# Patient Record
Sex: Male | Born: 1935 | Race: White | Hispanic: No | Marital: Married | Smoking: Former smoker
Health system: Southern US, Community
[De-identification: ages and names within clinical notes are randomized; demographics above are authoritative.]

## PROBLEM LIST (undated history)

## (undated) DIAGNOSIS — N200 Calculus of kidney: Secondary | ICD-10-CM

## (undated) DIAGNOSIS — E119 Type 2 diabetes mellitus without complications: Secondary | ICD-10-CM

## (undated) DIAGNOSIS — I639 Cerebral infarction, unspecified: Secondary | ICD-10-CM

## (undated) DIAGNOSIS — I10 Essential (primary) hypertension: Secondary | ICD-10-CM

## (undated) DIAGNOSIS — M1990 Unspecified osteoarthritis, unspecified site: Secondary | ICD-10-CM

## (undated) DIAGNOSIS — C61 Malignant neoplasm of prostate: Secondary | ICD-10-CM

## (undated) DIAGNOSIS — G4730 Sleep apnea, unspecified: Secondary | ICD-10-CM

## (undated) DIAGNOSIS — E7800 Pure hypercholesterolemia, unspecified: Secondary | ICD-10-CM

## (undated) HISTORY — PX: ADENOIDECTOMY: SUR15

## (undated) HISTORY — DX: Malignant neoplasm of prostate: C61

## (undated) HISTORY — DX: Cerebral infarction, unspecified: I63.9

## (undated) HISTORY — DX: Unspecified osteoarthritis, unspecified site: M19.90

## (undated) HISTORY — PX: OTHER SURGICAL HISTORY: SHX169

## (undated) HISTORY — PX: LIPOMA EXCISION: SHX5283

## (undated) HISTORY — PX: TONSILLECTOMY: SUR1361

## (undated) HISTORY — PX: HERNIA REPAIR: SHX51

---

## 2002-11-06 ENCOUNTER — Encounter (INDEPENDENT_AMBULATORY_CARE_PROVIDER_SITE_OTHER): Payer: Self-pay | Admitting: Specialist

## 2002-11-06 ENCOUNTER — Ambulatory Visit (HOSPITAL_COMMUNITY): Admission: RE | Admit: 2002-11-06 | Discharge: 2002-11-06 | Payer: Self-pay | Admitting: *Deleted

## 2002-11-06 ENCOUNTER — Encounter: Payer: Self-pay | Admitting: Gastroenterology

## 2003-10-03 ENCOUNTER — Ambulatory Visit (HOSPITAL_BASED_OUTPATIENT_CLINIC_OR_DEPARTMENT_OTHER): Admission: RE | Admit: 2003-10-03 | Discharge: 2003-10-03 | Payer: Self-pay | Admitting: Surgery

## 2003-10-03 ENCOUNTER — Ambulatory Visit (HOSPITAL_COMMUNITY): Admission: RE | Admit: 2003-10-03 | Discharge: 2003-10-03 | Payer: Self-pay | Admitting: Surgery

## 2003-10-03 ENCOUNTER — Encounter (INDEPENDENT_AMBULATORY_CARE_PROVIDER_SITE_OTHER): Payer: Self-pay | Admitting: *Deleted

## 2004-10-28 ENCOUNTER — Inpatient Hospital Stay (HOSPITAL_COMMUNITY): Admission: EM | Admit: 2004-10-28 | Discharge: 2004-10-29 | Payer: Self-pay | Admitting: Emergency Medicine

## 2009-01-28 ENCOUNTER — Encounter: Payer: Self-pay | Admitting: Gastroenterology

## 2009-06-02 ENCOUNTER — Encounter: Payer: Self-pay | Admitting: Gastroenterology

## 2009-06-04 ENCOUNTER — Encounter: Payer: Self-pay | Admitting: Gastroenterology

## 2009-09-02 ENCOUNTER — Ambulatory Visit: Payer: Self-pay | Admitting: Gastroenterology

## 2009-09-16 ENCOUNTER — Encounter: Payer: Self-pay | Admitting: Gastroenterology

## 2009-09-16 ENCOUNTER — Ambulatory Visit: Payer: Self-pay | Admitting: Gastroenterology

## 2009-09-18 ENCOUNTER — Encounter: Payer: Self-pay | Admitting: Gastroenterology

## 2010-07-14 ENCOUNTER — Ambulatory Visit: Payer: Self-pay | Admitting: Diagnostic Radiology

## 2010-07-14 ENCOUNTER — Emergency Department (HOSPITAL_BASED_OUTPATIENT_CLINIC_OR_DEPARTMENT_OTHER): Admission: EM | Admit: 2010-07-14 | Discharge: 2010-07-14 | Payer: Self-pay | Admitting: Emergency Medicine

## 2011-03-03 LAB — GLUCOSE, CAPILLARY
Glucose-Capillary: 120 mg/dL — ABNORMAL HIGH (ref 70–99)
Glucose-Capillary: 134 mg/dL — ABNORMAL HIGH (ref 70–99)

## 2011-03-28 ENCOUNTER — Ambulatory Visit: Admission: RE | Admit: 2011-03-28 | Payer: Medicare Other | Source: Ambulatory Visit | Admitting: Radiation Oncology

## 2011-03-28 DIAGNOSIS — Z7982 Long term (current) use of aspirin: Secondary | ICD-10-CM | POA: Insufficient documentation

## 2011-03-28 DIAGNOSIS — C61 Malignant neoplasm of prostate: Secondary | ICD-10-CM | POA: Insufficient documentation

## 2011-03-28 DIAGNOSIS — I10 Essential (primary) hypertension: Secondary | ICD-10-CM | POA: Insufficient documentation

## 2011-03-28 DIAGNOSIS — Z510 Encounter for antineoplastic radiation therapy: Secondary | ICD-10-CM | POA: Insufficient documentation

## 2011-03-28 DIAGNOSIS — Z79899 Other long term (current) drug therapy: Secondary | ICD-10-CM | POA: Insufficient documentation

## 2011-03-28 DIAGNOSIS — E119 Type 2 diabetes mellitus without complications: Secondary | ICD-10-CM | POA: Insufficient documentation

## 2011-03-28 DIAGNOSIS — M1990 Unspecified osteoarthritis, unspecified site: Secondary | ICD-10-CM | POA: Insufficient documentation

## 2011-03-28 DIAGNOSIS — E7800 Pure hypercholesterolemia, unspecified: Secondary | ICD-10-CM | POA: Insufficient documentation

## 2011-04-15 NOTE — Op Note (Signed)
NAME:  William [name], William [name]                          ACCOUNT NO.:  [id number]   MEDICAL RECORD NO.:  [id number]                   PATIENT TYPE:  AMB   LOCATION:  DSC                                  FACILITY:  [facility]   PHYSICIAN:  Abigail Miyamoto, M.D.              DATE OF BIRTH:  [birth year]   DATE OF PROCEDURE:  10/03/2003  DATE OF DISCHARGE:                                 OPERATIVE REPORT   PREOPERATIVE DIAGNOSIS:  3 cm mass left forehead and 5 cm posterior neck  mass.   POSTOPERATIVE DIAGNOSIS:  3 cm mass left forehead and 5 cm posterior neck  mass.   PROCEDURE:  Excision of 3 cm left forehead mass and excision of 5 cm left  posterior neck mass.   SURGEON:  Douglas A. Magnus Ivan, M.D.   ANESTHESIA:  1% lidocaine with monitored anesthesia care.   ESTIMATED BLOOD LOSS:  Minimal.   PROCEDURE IN DETAIL:  The patient was brought to the operating room and  identified as William [name].  He was placed supine on the operating table and  anesthesia was induced.  His forehead was then prepped and draped in the  usual sterile fashion.  The skin overlying the large mass was then  anesthetized with 1% lidocaine.  A small transverse incision was made across  the mass in the previous skin line.  Dissection was carried down to the mass  which was consistent with a lipoma and was excised completely with  electrocautery.  Hemostasis was then achieved in the wound with  electrocautery.  The wound was then further anesthetized with 1% lidocaine.  The subcutaneous tissue was then closed with interrupted 3-0 Vicryl suture,  the skin was closed with running 4-0 Monocryl.   The patient was then turned to the left lateral decubitus position.  His  posterior neck was then prepped and draped in the usual sterile fashion.  Again, the skin overlying the large mass was anesthetized with 1% lidocaine  with epinephrine.  A small transverse incision was made across the mass with  a #15 blade.  Dissection was  carried down to the mass which was again  consistent with a lipoma and was excised completely with electrocautery.  It  was then sent to pathology for identification along with the forehead mass.  The wound was then again irrigated with saline and hemostasis was achieved  with electrocautery.  The subcutaneous tissue was then closed with  interrupted 3-0 Vicryl suture, the skin was closed with running 4-0  Monocryl.  Steri-Strips, gauze, and tape was applied.  The patient tolerated  the procedure well.  All sponge, needle and instrument counts were correct  at the end of the procedure.  The patient was then extubated in the  operating room and taken in stable condition to the recovery room.  Abigail Miyamoto, M.D.    DB/MEDQ  D:  10/03/2003  T:  10/03/2003  Job:  [id number]

## 2011-04-15 NOTE — Op Note (Signed)
   NAME:  William [name], William [name] NO.:  [id number]   MEDICAL RECORD NO.:  [id number]                   PATIENT TYPE:  AMB   LOCATION:  ED                                   FACILITY:  [facility]   PHYSICIAN:  Georgiana Spinner, M.D.                 DATE OF BIRTH:  08-Apr-1934   DATE OF PROCEDURE:  11/06/2002  DATE OF DISCHARGE:                                 OPERATIVE REPORT   PROCEDURE:  Upper endoscopy.   INDICATIONS:  GERD.   ANESTHESIA:  Demerol 60 mg, Versed 5 mg.   DESCRIPTION OF PROCEDURE:  With the patient mildly sedated in the left  lateral decubitus position, the Olympus videoscopic endoscope inserted in  the mouth, passed under direct vision through the esophagus.  Small hiatal  hernia was seen, with no evidence of Barrett's.  Photograph taken.  The  remainder of the stomach, fundus, body, antrum were visualized.  There were  flecks of blood seen in the stomach and the antrum, and some of the body  appeared somewhat erythematous, consistent with a gastritis, which was  photographed and biopsied.  We then entered into the duodenal bulb.  The  second portion of the duodenum appeared normal.  From this point the  endoscope was slowly withdrawn, taking circumferential views of the entire  duodenal mucosa until the endoscope pulled back into the stomach, placed in  retroflexion to view the stomach from below, and once again a hiatal hernia  was seen as evidenced by incomplete wrap of the GE junction around the  endoscope.  The endoscope was then straightened and withdrawn, taking  circumferential views of the remaining gastric and esophageal mucosa.  The  patient's vital signs and pulse oximetry remained stable.  The patient  tolerated the procedure well and without apparent complications.   FINDINGS:  Changes of gastritis and incomplete wrap of the gastroesophageal  junction around the endoscope, resulting in a hiatal hernia, otherwise an  unremarkable  exam.   PLAN:  Proceed to colonoscopy.  Await biopsy report.  The patient will call  me for results and follow up with me as an outpatient.                                               Georgiana Spinner, M.D.    GMO/MEDQ  D:  11/06/2002  T:  11/06/2002  Job:  [id number]

## 2011-04-15 NOTE — Op Note (Signed)
   NAME:  William [name], William [name] NO.:  [id number]   MEDICAL RECORD NO.:  [id number]                   PATIENT TYPE:  AMB   LOCATION:  ED                                   FACILITY[facility]  [facility]   PHYSICIAN:  Georgiana Spinner, M.D.                 DATE OF BIRTH:  [birth year]   DATE OF PROCEDURE:  11/06/2002  DATE OF DISCHARGE:                                 OPERATIVE REPORT   PROCEDURE:  Colonoscopy.   INDICATIONS:  Colon polyp.   ANESTHESIA:  Demerol 40 mg, Versed 2 mg.   DESCRIPTION OF PROCEDURE:  With the patient mildly sedated in the left  lateral decubitus position, the Olympus videoscopic colonoscope was inserted  in the rectum after normal rectal exam was performed, passed under direct  vision to the cecum, identified by the ileocecal valve and appendiceal  orifice, both of which were photographed.  We entered into the terminal  ileum through a widely patent ileocecal valve.  This too appeared normal and  was photographed.  From this point the colonoscope was slowly withdrawn,  taking circumferential views of the entire colonic mucosa, stopping on our  way to the rectum only at 15 cm from the anal verge, at which point a small  polyp was seen on a stalk.  It was photographed, and it was removed using  snare cautery technique, setting of 20-20 blended current.  Tissue was  retrieved for pathology.  In the rectum the endoscope was placed in  retroflexion to view the anal canal from above, which showed hemorrhoids.  The endoscope was straightened and withdrawn.  The patient's vital signs and  pulse oximetry remained stable.  The patient tolerated the procedure well  without apparent complications.   FINDINGS:  1. Rare diverticula of the sigmoid colon.  2. Small internal hemorrhoids.  3. Small polyp at 15 cm from the anal verge.   PLAN:  Await biopsy report.  The patient will call me for results and follow  up with me as an outpatient.  See endoscopy note for  further details.                                                Georgiana Spinner, M.D.    GMO/MEDQ  D:  11/06/2002  T:  11/06/2002  Job:  [id number]

## 2011-04-15 NOTE — Discharge Summary (Signed)
NAME:  William [name], William [name]                ACCOUNT NO.:  [id number]   MEDICAL RECORD NO.:  [id number]          PATIENT TYPE:  INP   LOCATION:  6524                         FACILITY:  [facility]   PHYSICIAN:  Danae Chen, M.D.DATE OF BIRTH:  01-09-1934   DATE OF ADMISSION:  10/28/2004  DATE OF DISCHARGE:  10/29/2004                                 DISCHARGE SUMMARY   PRIMARY CARE PHYSICIAN:  Juline Patch, M.D., [facility].   DISCHARGE DIAGNOSES:  1.  Hypoglycemic event with altered mental status.  2.  Transient atrial fibrillation.  3.  Diabetes, type 2, non-insulin-dependent.  4.  Hypertension.   DISCHARGE MEDICATIONS:  The patient is to resume his home medications,  except for his glyburide, Metformin combination pill.  He is to not take  this medication until he sees his primary care physician on his followup  appointment.   MEDICATIONS:  1.  Lisinopril/hydrochlorothiazide combo 20/25 one p.o. daily.  2.  Aspirin 81 mg one p.o. daily.  3.  Lipitor 10 mg one p.o. daily.   FOLLOW UP:  Followup appointment with Dr. Juline Patch on Monday, November 01, 2004 at 12 noon.   BRIEF HOSPITAL COURSE:  The patient is a pleasant 77 year old gentleman,  retired, living here in [place], who presented to the emergency room  after he was found to be profoundly hypoglycemic at home.  He was preparing  to go out with his wife in the late afternoon, had not changed his  medications recently, and became diaphoretic, had altered mental status, and  was found to have a blood sugar in the low 30s when EMS arrived.  He denied  any loss of consciousness, no chest pain, no shortness of breath, no nausea,  no vomiting.  He reports that he had been switched from his Actos medication  to a Metformin/glyburide combination back in July and has this readjusted  again in September because he had developed some peripheral edema with the  Actos or that was the thinking by his primary  doctor.  He does not report  any other hypoglycemic events that he can recall and his blood glucose  monitoring book which shows that his blood sugars have been consistently in  the 100-120 with some 130 ranges over the past several months.  As noted, he  has had no change in his diet, no other medications that he is aware of.  Currently, he is asymptomatic, feels well, has been eating, and his blood  sugars in the hospital have been in the 100 to low 200, and he has also been  on a 10% dextrose solution IV drip now which he has received 1 liter.   PHYSICAL EXAMINATION:  GENERAL:  He is alert and oriented.  LUNGS:  Clear.  ABDOMEN:  Slightly obese, but soft nontender with no rebound, no guarding.  EXTREMITIES:  He has no peripheral edema that is noticed.  HEART:  Rate is regular.  Telemetry shows normal sinus rhythm.   PERTINENT DISCHARGE LABS:  As noted, his CBGs, last CBG was 195.  They have  ranged from  136 to 205.  Hemoglobin A1c is pending at the time of discharge.  Otherwise CBC hemoglobin of 16, platelets of 199, white count of 9.4 and a  Chem-7 of sodium 137, potassium 4.1, chloride 108, CO2 23, BUN 18,  creatinine 0.9, glucose 128.  AST 20, ALT 27, alk phos of 60, total bili of  1.3, albumin is 3.7, calcium is at 8.7.  Lipid profile and TSH and  hemoglobin A1c are pending at the time of discharge.   CONDITION ON DISCHARGE:  Improved.   FOLLOW UP:  As above.       RLK/MEDQ  D:  10/29/2004  T:  10/30/2004  Job:  [id number]

## 2011-04-15 NOTE — H&P (Signed)
NAME:  William [name], William [name]                ACCOUNT NO.:  [id number]   MEDICAL RECORD NO.:  [id number]          PATIENT TYPE:  EMS   LOCATION:  MAJO                         FACILITY:  [facility]   PHYSICIAN:  Lonia Blood, M.D.      DATE OF BIRTH:  11-20-1934   DATE OF ADMISSION:  10/28/2004  DATE OF DISCHARGE:                                HISTORY & PHYSICAL   CHIEF COMPLAINT:  Hypoglycemia and altered mental status.   HISTORY OF PRESENT ILLNESS:  This is a 77 year old white male with history  of type 2 diabetes and hypertension who has previously been doing okay until  this evening when he essentially passed out.  Wife said the patient has been  doing okay and has been checking his sugars regularly.  The last CBG this  morning was about 114.  The patient took his regular medication which  includes Glyburide 5 mg, Metformin 500 mg combined.  The patient was on  Lactose up until four weeks ago and his sugars had been ranging between 100  and 114.  He was however, switched to a low dose Metformin and Glyburide at  2.5 mg of Glyburide.  The patient's sugar was around 120 to 130 at that  point and he was recently asked to increase the dose to 5 mg of Glyburide.  He has been doing okay on that apparently in the past four weeks until today  when he suddenly developed these symptoms.  At the time of his passing out,  his sugar was found to be in the 20's, according to his wife.  She  subsequently gave him some D50 and called EMS.  His wife is a Engineer, civil (consulting) and well-  versed in what is going on.  The patient has good records of his daily blood  sugars that indicated that his sugar has been running close to 100 for the  most part.  He denied any acute illness or any other condition that may have  lead to this particular hypersensitivity.   PAST MEDICAL HISTORY:  Diabetes type 2, hypertension, history of hernia  surgery, and obesity.   MEDICATIONS:  1.  Lisinopril HCTZ 20/25 daily.  2.  Lipitor 10 mg  daily.  3.  Metformin Glyburide 500/5 mg daily.  4.  Aspirin 81 mg daily.   ALLERGIES:  No known drug allergies.   FAMILY HISTORY:  Diabetes and hypertension.   SOCIAL HISTORY:  The patient lives in [place] with his wife.  He is  currently retired.  He quit smoking more than five years ago.  He drinks an  occasional glass of wine at night.  The patient has six children and seven  grandchildren.  One of his children has type 1 diabetes.   REVIEW OF SYSTEMS:  Generally denies any fever, weight gain or weight loss.  Denies any dizziness.  RESPIRATORY:  Denied any shortness of breath or chest  pain or cough.  CARDIOVASCULAR:  Denied any chest pain, orthopnea, PND, or  recent pedal swelling.  The patient, however, did have palpitations at the  time and per wife, he  did have evidence of atrial fibrillation according to  EMS note.  ABDOMEN:  The patient denied any nausea, vomiting, or diarrhea.  No abdominal pain.  EXTREMITIES:  Showed no cyanosis, clubbing, or edema.   LABORATORY DATA:  On arrival here showed white count of 13.1, hemoglobin  15.9, platelets 215, ANC 10.9.  His UA is essentially normal.  His blood  sugar was found to be 56 despite taking crackers plus orange juice in the  emergency room.  His creatinine is essentially 1.1.   ASSESSMENT:  1.  This is a 77 year old diabetic presenting with profound hypoglycemia at      home.  This episode followed taking the normal dose of his Glyburide      Metformin combination.  Per the patient, he has had increased dose of      his medicine recently.  The most likely cause therefore is oral      hypoglycemics especially the Glyburide.  I have discussed with the      patient the fact that this has a long half-life +24 hours at least,      which means we will have to keep the patient overnight, observe him      closely to see if his sugars will stay up with some IV glucose infusion.      Possibilities if his hypoglycemia persists,  maybe some insulin for      tumors or lesions which are less likely in this case.  2.  Hypertension.  With regards to his hypertension, we will continue his      home medicine and observe his blood pressure overnight.  3.  Atrial fibrillation.  The patient's wife has observed nonsustained      atrial fibrillation in and out in the patient from the EMS to the ED.      This is new because the patient has no prior history.  He is currently      in normal sinus rhythm at this point.  We will therefore, put him on      telemetry for that purpose and observe him overnight.  If he continues      to have this problem, then we will think about possible causes including      checking two-dimensional echocardiogram and then treatment for his      atrial fibrillation.  The most likely cause of his arrhythmia may be the      bout of hypoglycemia that he had.      [place]   LG/MEDQ  D:  10/28/2004  T:  10/29/2004  Job:  [id number]

## 2011-06-27 ENCOUNTER — Ambulatory Visit
Admission: RE | Admit: 2011-06-27 | Discharge: 2011-06-27 | Disposition: A | Discharge status: Home / Self Care | Payer: Medicare Other | Source: Ambulatory Visit | Attending: Radiation Oncology | Admitting: Radiation Oncology

## 2011-06-27 DIAGNOSIS — Z510 Encounter for antineoplastic radiation therapy: Secondary | ICD-10-CM | POA: Insufficient documentation

## 2011-06-27 DIAGNOSIS — C61 Malignant neoplasm of prostate: Secondary | ICD-10-CM | POA: Insufficient documentation

## 2011-06-27 DIAGNOSIS — R300 Dysuria: Secondary | ICD-10-CM | POA: Insufficient documentation

## 2011-06-27 DIAGNOSIS — R351 Nocturia: Secondary | ICD-10-CM | POA: Insufficient documentation

## 2011-09-08 ENCOUNTER — Ambulatory Visit
Admission: RE | Admit: 2011-09-08 | Discharge: 2011-09-08 | Disposition: A | Discharge status: Home / Self Care | Payer: Medicare Other | Source: Ambulatory Visit | Attending: Radiation Oncology | Admitting: Radiation Oncology

## 2012-01-05 DIAGNOSIS — J1189 Influenza due to unidentified influenza virus with other manifestations: Secondary | ICD-10-CM | POA: Diagnosis not present

## 2012-03-08 DIAGNOSIS — E119 Type 2 diabetes mellitus without complications: Secondary | ICD-10-CM | POA: Diagnosis not present

## 2012-03-08 DIAGNOSIS — Z125 Encounter for screening for malignant neoplasm of prostate: Secondary | ICD-10-CM | POA: Diagnosis not present

## 2012-03-15 DIAGNOSIS — E119 Type 2 diabetes mellitus without complications: Secondary | ICD-10-CM | POA: Diagnosis not present

## 2012-03-15 DIAGNOSIS — E7800 Pure hypercholesterolemia, unspecified: Secondary | ICD-10-CM | POA: Diagnosis not present

## 2012-03-15 DIAGNOSIS — M1710 Unilateral primary osteoarthritis, unspecified knee: Secondary | ICD-10-CM | POA: Diagnosis not present

## 2012-03-15 DIAGNOSIS — I10 Essential (primary) hypertension: Secondary | ICD-10-CM | POA: Diagnosis not present

## 2012-03-19 DIAGNOSIS — C61 Malignant neoplasm of prostate: Secondary | ICD-10-CM | POA: Diagnosis not present

## 2012-05-16 DIAGNOSIS — E119 Type 2 diabetes mellitus without complications: Secondary | ICD-10-CM | POA: Diagnosis not present

## 2012-05-16 DIAGNOSIS — H2510 Age-related nuclear cataract, unspecified eye: Secondary | ICD-10-CM | POA: Diagnosis not present

## 2012-05-16 DIAGNOSIS — H524 Presbyopia: Secondary | ICD-10-CM | POA: Diagnosis not present

## 2012-05-16 DIAGNOSIS — H40019 Open angle with borderline findings, low risk, unspecified eye: Secondary | ICD-10-CM | POA: Diagnosis not present

## 2012-06-07 DIAGNOSIS — E119 Type 2 diabetes mellitus without complications: Secondary | ICD-10-CM | POA: Diagnosis not present

## 2012-06-14 DIAGNOSIS — E7800 Pure hypercholesterolemia, unspecified: Secondary | ICD-10-CM | POA: Diagnosis not present

## 2012-06-14 DIAGNOSIS — E119 Type 2 diabetes mellitus without complications: Secondary | ICD-10-CM | POA: Diagnosis not present

## 2012-06-14 DIAGNOSIS — I10 Essential (primary) hypertension: Secondary | ICD-10-CM | POA: Diagnosis not present

## 2012-06-18 DIAGNOSIS — C61 Malignant neoplasm of prostate: Secondary | ICD-10-CM | POA: Diagnosis not present

## 2012-07-19 DIAGNOSIS — H524 Presbyopia: Secondary | ICD-10-CM | POA: Diagnosis not present

## 2012-07-19 DIAGNOSIS — H35379 Puckering of macula, unspecified eye: Secondary | ICD-10-CM | POA: Diagnosis not present

## 2012-07-19 DIAGNOSIS — H40019 Open angle with borderline findings, low risk, unspecified eye: Secondary | ICD-10-CM | POA: Diagnosis not present

## 2012-07-19 DIAGNOSIS — H35039 Hypertensive retinopathy, unspecified eye: Secondary | ICD-10-CM | POA: Diagnosis not present

## 2012-09-07 ENCOUNTER — Encounter: Payer: Self-pay | Admitting: Gastroenterology

## 2012-09-14 DIAGNOSIS — Z125 Encounter for screening for malignant neoplasm of prostate: Secondary | ICD-10-CM | POA: Diagnosis not present

## 2012-09-14 DIAGNOSIS — E119 Type 2 diabetes mellitus without complications: Secondary | ICD-10-CM | POA: Diagnosis not present

## 2012-09-14 DIAGNOSIS — E7800 Pure hypercholesterolemia, unspecified: Secondary | ICD-10-CM | POA: Diagnosis not present

## 2012-09-19 DIAGNOSIS — M1710 Unilateral primary osteoarthritis, unspecified knee: Secondary | ICD-10-CM | POA: Diagnosis not present

## 2012-09-19 DIAGNOSIS — E119 Type 2 diabetes mellitus without complications: Secondary | ICD-10-CM | POA: Diagnosis not present

## 2012-09-19 DIAGNOSIS — I10 Essential (primary) hypertension: Secondary | ICD-10-CM | POA: Diagnosis not present

## 2012-09-19 DIAGNOSIS — Z23 Encounter for immunization: Secondary | ICD-10-CM | POA: Diagnosis not present

## 2012-09-19 DIAGNOSIS — E7800 Pure hypercholesterolemia, unspecified: Secondary | ICD-10-CM | POA: Diagnosis not present

## 2012-09-19 DIAGNOSIS — M25569 Pain in unspecified knee: Secondary | ICD-10-CM | POA: Diagnosis not present

## 2012-09-27 DIAGNOSIS — C61 Malignant neoplasm of prostate: Secondary | ICD-10-CM | POA: Diagnosis not present

## 2012-09-30 ENCOUNTER — Emergency Department (HOSPITAL_BASED_OUTPATIENT_CLINIC_OR_DEPARTMENT_OTHER)
Admission: EM | Admit: 2012-09-30 | Discharge: 2012-09-30 | Disposition: A | Discharge status: Home / Self Care | Payer: Medicare Other | Attending: Emergency Medicine | Admitting: Emergency Medicine

## 2012-09-30 ENCOUNTER — Encounter (HOSPITAL_BASED_OUTPATIENT_CLINIC_OR_DEPARTMENT_OTHER): Payer: Self-pay | Admitting: *Deleted

## 2012-09-30 DIAGNOSIS — Z7982 Long term (current) use of aspirin: Secondary | ICD-10-CM | POA: Insufficient documentation

## 2012-09-30 DIAGNOSIS — S0101XA Laceration without foreign body of scalp, initial encounter: Secondary | ICD-10-CM

## 2012-09-30 DIAGNOSIS — S0100XA Unspecified open wound of scalp, initial encounter: Secondary | ICD-10-CM | POA: Insufficient documentation

## 2012-09-30 DIAGNOSIS — E7800 Pure hypercholesterolemia, unspecified: Secondary | ICD-10-CM | POA: Insufficient documentation

## 2012-09-30 DIAGNOSIS — E119 Type 2 diabetes mellitus without complications: Secondary | ICD-10-CM | POA: Diagnosis not present

## 2012-09-30 DIAGNOSIS — Y92009 Unspecified place in unspecified non-institutional (private) residence as the place of occurrence of the external cause: Secondary | ICD-10-CM | POA: Insufficient documentation

## 2012-09-30 DIAGNOSIS — Y9389 Activity, other specified: Secondary | ICD-10-CM | POA: Insufficient documentation

## 2012-09-30 DIAGNOSIS — IMO0002 Reserved for concepts with insufficient information to code with codable children: Secondary | ICD-10-CM | POA: Insufficient documentation

## 2012-09-30 DIAGNOSIS — I10 Essential (primary) hypertension: Secondary | ICD-10-CM | POA: Insufficient documentation

## 2012-09-30 DIAGNOSIS — Z79899 Other long term (current) drug therapy: Secondary | ICD-10-CM | POA: Diagnosis not present

## 2012-09-30 HISTORY — DX: Pure hypercholesterolemia, unspecified: E78.00

## 2012-09-30 HISTORY — DX: Type 2 diabetes mellitus without complications: E11.9

## 2012-09-30 HISTORY — DX: Essential (primary) hypertension: I10

## 2012-09-30 NOTE — ED Provider Notes (Signed)
History     CSN: [id number]  Arrival date & time 09/30/12  1258   First MD Initiated Contact with Patient 09/30/12 1328      Chief Complaint  Patient presents with  . Head Laceration    (Consider location/radiation/quality/duration/timing/severity/associated sxs/prior treatment) Patient is a 78 y.o. male presenting with scalp laceration. The history is provided by the patient.  Head Laceration This is a new problem. The current episode started today. The problem occurs constantly. The problem has been gradually worsening. Nothing aggravates the symptoms. He has tried nothing for the symptoms. The treatment provided moderate relief.  Pt reports he hit his head on the top the attic door.   Pt complains of a cut on his head.   No loss of consciousness.  Family reports pt is acting normally.  No visual change.  No hearing change.   Past Medical History  Diagnosis Date  . Hypertension   . Diabetes mellitus without complication   . Hypercholesteremia     Past Surgical History  Procedure Date  . Hernia repair   . Tonsillectomy   . Adenoidectomy   . Lipoma excision     History reviewed. No pertinent family history.  History  Substance Use Topics  . Smoking status: Never Smoker   . Smokeless tobacco: Not on file  . Alcohol Use: No      Review of Systems  Skin: Positive for wound.  All other systems reviewed and are negative.    Allergies  Review of patient's allergies indicates no known allergies.  Home Medications   Current Outpatient Rx  Name  Route  Sig  Dispense  Refill  . ASPIRIN 81 MG PO TABS   Oral   Take 81 mg by mouth daily.         Marland Kitchen COENZYME Q10 30 MG PO CAPS   Oral   Take 300 mg by mouth daily.         Marland Kitchen GLUCOSAMINE-CHONDROITIN 500-400 MG PO TABS   Oral   Take 3 tablets by mouth 2 (two) times daily.         Marland Kitchen LISINOPRIL-HYDROCHLOROTHIAZIDE 10-12.5 MG PO TABS   Oral   Take 1 tablet by mouth daily.         Marland Kitchen METFORMIN HCL 1000 MG PO  TABS   Oral   Take 1,000 mg by mouth 2 (two) times daily with a meal.         . PRAVASTATIN SODIUM 40 MG PO TABS   Oral   Take 40 mg by mouth daily.         Marland Kitchen SAXAGLIPTIN HCL 2.5 MG PO TABS   Oral   Take 5 mg by mouth daily.           BP 141/76  Pulse 80  Temp 98.4 F (36.9 C) (Oral)  Resp 20  Ht 6\' 1"  (1.854 m)  Wt 240 lb (108.863 kg)  BMI 31.66 kg/m2  SpO2 96%  Physical Exam  Nursing note and vitals reviewed. Constitutional: He is oriented to person, place, and time. He appears well-developed and well-nourished.  HENT:  Head: Normocephalic.  Right Ear: External ear normal.  Nose: Nose normal.  Mouth/Throat: Oropharynx is clear and moist.       2 cm laceration scalp  gapping  Eyes: Conjunctivae normal and EOM are normal. Pupils are equal, round, and reactive to light.  Neck: Normal range of motion. Neck supple.  Cardiovascular: Normal rate and normal heart sounds.  Pulmonary/Chest: Effort normal and breath sounds normal.  Abdominal: Soft.  Musculoskeletal: Normal range of motion. He exhibits no tenderness.  Neurological: He is alert and oriented to person, place, and time. He has normal reflexes.  Psychiatric: He has a normal mood and affect.    ED Course  LACERATION REPAIR Date/Time: 09/30/2012 3:26 PM Performed by: Elson Areas Authorized by: Elson Areas Consent: Verbal consent not obtained. Risks and benefits: risks, benefits and alternatives were discussed Consent given by: patient Patient understanding: patient does not state understanding of the procedure being performed Body area: head/neck Laceration length: 2 cm Foreign bodies: no foreign bodies Anesthesia: local infiltration Preparation: Patient was prepped and draped in the usual sterile fashion. Irrigation solution: saline Skin closure: staples Number of sutures: 3 Approximation: close Patient tolerance: Patient tolerated the procedure well with no immediate complications.    (including critical care time)  Labs Reviewed - No data to display No results found.   1. Laceration of scalp       MDM  Pt advised to have staples removed in 7-8 days        Lonia Skinner [place], [place] 09/30/12 1527

## 2012-09-30 NOTE — ED Notes (Signed)
Pt states he was messing with a trap door to the attic and hit himself in the head. No LOC. PERL. Approx 1 in lac to head. Bleeding controlled. Denies other s/s.

## 2012-09-30 NOTE — ED Notes (Signed)
MD at bedside. Langston Masker, PA suturing.

## 2012-09-30 NOTE — ED Provider Notes (Signed)
Medical screening examination/treatment/procedure(s) were performed by non-physician practitioner and as supervising physician I was immediately available for consultation/collaboration.   Cherlyn Syring, MD 09/30/12 1600 

## 2012-10-01 DIAGNOSIS — M25569 Pain in unspecified knee: Secondary | ICD-10-CM | POA: Diagnosis not present

## 2012-10-01 DIAGNOSIS — M1990 Unspecified osteoarthritis, unspecified site: Secondary | ICD-10-CM | POA: Diagnosis not present

## 2012-10-08 DIAGNOSIS — Z4802 Encounter for removal of sutures: Secondary | ICD-10-CM | POA: Diagnosis not present

## 2012-10-08 DIAGNOSIS — S06330A Contusion and laceration of cerebrum, unspecified, without loss of consciousness, initial encounter: Secondary | ICD-10-CM | POA: Diagnosis not present

## 2012-12-29 DIAGNOSIS — G4730 Sleep apnea, unspecified: Secondary | ICD-10-CM

## 2012-12-29 HISTORY — DX: Sleep apnea, unspecified: G47.30

## 2013-01-15 DIAGNOSIS — Z125 Encounter for screening for malignant neoplasm of prostate: Secondary | ICD-10-CM | POA: Diagnosis not present

## 2013-01-15 DIAGNOSIS — E119 Type 2 diabetes mellitus without complications: Secondary | ICD-10-CM | POA: Diagnosis not present

## 2013-01-22 DIAGNOSIS — E119 Type 2 diabetes mellitus without complications: Secondary | ICD-10-CM | POA: Diagnosis not present

## 2013-01-22 DIAGNOSIS — Z0000 Encounter for general adult medical examination without abnormal findings: Secondary | ICD-10-CM | POA: Diagnosis not present

## 2013-03-10 ENCOUNTER — Emergency Department (HOSPITAL_BASED_OUTPATIENT_CLINIC_OR_DEPARTMENT_OTHER): Payer: Medicare Other

## 2013-03-10 ENCOUNTER — Encounter (HOSPITAL_BASED_OUTPATIENT_CLINIC_OR_DEPARTMENT_OTHER): Payer: Self-pay

## 2013-03-10 ENCOUNTER — Emergency Department (HOSPITAL_BASED_OUTPATIENT_CLINIC_OR_DEPARTMENT_OTHER)
Admission: EM | Admit: 2013-03-10 | Discharge: 2013-03-10 | Disposition: A | Discharge status: Home / Self Care | Payer: Medicare Other | Attending: Emergency Medicine | Admitting: Emergency Medicine

## 2013-03-10 DIAGNOSIS — E119 Type 2 diabetes mellitus without complications: Secondary | ICD-10-CM | POA: Diagnosis not present

## 2013-03-10 DIAGNOSIS — E7800 Pure hypercholesterolemia, unspecified: Secondary | ICD-10-CM | POA: Insufficient documentation

## 2013-03-10 DIAGNOSIS — Z7982 Long term (current) use of aspirin: Secondary | ICD-10-CM | POA: Insufficient documentation

## 2013-03-10 DIAGNOSIS — IMO0002 Reserved for concepts with insufficient information to code with codable children: Secondary | ICD-10-CM | POA: Insufficient documentation

## 2013-03-10 DIAGNOSIS — I10 Essential (primary) hypertension: Secondary | ICD-10-CM | POA: Insufficient documentation

## 2013-03-10 DIAGNOSIS — X500XXA Overexertion from strenuous movement or load, initial encounter: Secondary | ICD-10-CM | POA: Insufficient documentation

## 2013-03-10 DIAGNOSIS — S8392XA Sprain of unspecified site of left knee, initial encounter: Secondary | ICD-10-CM

## 2013-03-10 DIAGNOSIS — M25469 Effusion, unspecified knee: Secondary | ICD-10-CM | POA: Diagnosis not present

## 2013-03-10 DIAGNOSIS — Y9389 Activity, other specified: Secondary | ICD-10-CM | POA: Insufficient documentation

## 2013-03-10 DIAGNOSIS — Z79899 Other long term (current) drug therapy: Secondary | ICD-10-CM | POA: Insufficient documentation

## 2013-03-10 DIAGNOSIS — S99929A Unspecified injury of unspecified foot, initial encounter: Secondary | ICD-10-CM | POA: Diagnosis not present

## 2013-03-10 DIAGNOSIS — Y92009 Unspecified place in unspecified non-institutional (private) residence as the place of occurrence of the external cause: Secondary | ICD-10-CM | POA: Insufficient documentation

## 2013-03-10 MED ORDER — HYDROCODONE-ACETAMINOPHEN 5-325 MG PO TABS: 1.0000 | ORAL_TABLET | Freq: Four times a day (QID) | ORAL | Status: DC | PRN

## 2013-03-10 NOTE — ED Provider Notes (Addendum)
History     CSN: [id number]  Arrival date & time 03/10/13  1017   First MD Initiated Contact with Patient 03/10/13 1028      Chief Complaint  Patient presents with  . Knee Injury    (Consider location/radiation/quality/duration/timing/severity/associated sxs/prior treatment) Patient is a 79 y.o. male presenting with knee pain. The history is provided by the patient.  Knee Pain Location:  Knee Time since incident:  1 day Injury: yes   Mechanism of injury comment:  Getting down off a ladder and  and his knee twisted 90 degrees  Knee location:  L knee Pain details:    Quality:  Aching and throbbing   Radiates to:  Does not radiate   Severity:  Moderate   Onset quality:  Sudden   Duration:  1 day   Timing:  Constant   Progression:  Worsening Chronicity:  New Dislocation: no   Prior injury to area:  No Relieved by:  Ice, elevation and acetaminophen Worsened by:  Activity, bearing weight and flexion Associated symptoms: swelling     Past Medical History  Diagnosis Date  . Hypertension   . Diabetes mellitus without complication   . Hypercholesteremia     Past Surgical History  Procedure Laterality Date  . Hernia repair    . Tonsillectomy    . Adenoidectomy    . Lipoma excision      History reviewed. No pertinent family history.  History  Substance Use Topics  . Smoking status: Never Smoker   . Smokeless tobacco: Never Used  . Alcohol Use: Yes      Review of Systems  All other systems reviewed and are negative.    Allergies  Review of patient's allergies indicates no known allergies.  Home Medications   Current Outpatient Rx  Name  Route  Sig  Dispense  Refill  . acetaminophen (TYLENOL) 500 MG tablet   Oral   Take 1,000 mg by mouth every 6 (six) hours as needed for pain.         Marland Kitchen aspirin 81 MG tablet   Oral   Take 81 mg by mouth daily.         Marland Kitchen co-enzyme Q-10 30 MG capsule   Oral   Take 300 mg by mouth daily.         Marland Kitchen  glucosamine-chondroitin 500-400 MG tablet   Oral   Take 3 tablets by mouth 2 (two) times daily.         Marland Kitchen lisinopril-hydrochlorothiazide (PRINZIDE,ZESTORETIC) 10-12.5 MG per tablet   Oral   Take 1 tablet by mouth daily.         . metFORMIN (GLUCOPHAGE) 1000 MG tablet   Oral   Take 1,000 mg by mouth 2 (two) times daily with a meal.         . pravastatin (PRAVACHOL) 40 MG tablet   Oral   Take 40 mg by mouth daily.         . saxagliptin HCl (ONGLYZA) 2.5 MG TABS tablet   Oral   Take 5 mg by mouth daily.           BP 130/69  Pulse 98  Temp(Src) 98.1 F (36.7 C) (Oral)  Resp 20  Ht 6\' 1"  (1.854 m)  Wt 240 lb (108.863 kg)  BMI 31.67 kg/m2  SpO2 97%  Physical Exam  Nursing note and vitals reviewed. Constitutional: He is oriented to person, place, and time. He appears well-developed and well-nourished. No distress.  HENT:  Head: Normocephalic and atraumatic.  Eyes: EOM are normal. Pupils are equal, round, and reactive to light.  Cardiovascular: Normal rate.   Pulmonary/Chest: Effort normal.  Musculoskeletal:       Left knee: He exhibits swelling, effusion and LCL laxity. He exhibits no deformity and no MCL laxity. Tenderness found. Lateral joint line tenderness noted. No medial joint line tenderness noted.  Tenderness with palpation of the posterior knee  Neurological: He is alert and oriented to person, place, and time.  Skin: Skin is warm and dry. No rash noted. No erythema.  Psychiatric: He has a normal mood and affect. His behavior is normal.    ED Course  Procedures (including critical care time)  Labs Reviewed - No data to display Dg Knee Complete 4 Views Left  03/10/2013  *RADIOLOGY REPORT*  Clinical Data: Injury to left knee with swelling and pain.  LEFT KNEE - COMPLETE 4+ VIEW  Comparison: 09/19/2012  Findings: Four views of the knee demonstrate osteoarthritis, particularly in the medial and patellofemoral knee compartments. There is marked joint space  narrowing along the medial knee compartment which is similar to the prior examination.  There is a moderate sized suprapatellar joint effusion. No evidence for a fracture or dislocation.  There are vascular calcifications present.  IMPRESSION: Severe osteoarthritis in the left knee with a suprapatellar joint effusion.  No acute bony abnormality.   Original Report Authenticated By: Richarda Overlie, M.D.      1. Knee sprain, left, initial encounter       MDM   Patient with a mechanical injury to the left knee yesterday where the knee twisted approximately 90 and since that time has had worsening pain and swelling of the left knee. He is able to walk but is limping and has to use a cane. He has a history of arthritis in his knees but has never had surgery on his knee and denies any symptoms suggestive of dislocation. Neurovascularly intact with mild laxity of the LCL.  Plain film pending however feel this is most likely a ligamentous injury and patient will be placed in knee immobilizer and crutches  11:24 AM Plain film with effusion only.       Gwyneth Sprout, MD 03/10/13 1124  Gwyneth Sprout, MD 03/10/13 1125

## 2013-03-10 NOTE — ED Notes (Signed)
Pt states that he injured his knee yesterday while working in the yard.  Pt states that his L knee twisted 90 degrees.  Incr swelling and pain since time of injury, ambulates with cane and limping gait.

## 2013-03-21 DIAGNOSIS — M2390 Unspecified internal derangement of unspecified knee: Secondary | ICD-10-CM | POA: Diagnosis not present

## 2013-03-21 DIAGNOSIS — M25569 Pain in unspecified knee: Secondary | ICD-10-CM | POA: Diagnosis not present

## 2013-03-21 DIAGNOSIS — M1710 Unilateral primary osteoarthritis, unspecified knee: Secondary | ICD-10-CM | POA: Diagnosis not present

## 2013-03-28 DIAGNOSIS — C61 Malignant neoplasm of prostate: Secondary | ICD-10-CM | POA: Diagnosis not present

## 2013-05-16 DIAGNOSIS — H40019 Open angle with borderline findings, low risk, unspecified eye: Secondary | ICD-10-CM | POA: Diagnosis not present

## 2013-05-16 DIAGNOSIS — H01009 Unspecified blepharitis unspecified eye, unspecified eyelid: Secondary | ICD-10-CM | POA: Diagnosis not present

## 2013-05-17 ENCOUNTER — Encounter: Payer: Self-pay | Admitting: Gastroenterology

## 2013-05-23 DIAGNOSIS — E7800 Pure hypercholesterolemia, unspecified: Secondary | ICD-10-CM | POA: Diagnosis not present

## 2013-05-23 DIAGNOSIS — E119 Type 2 diabetes mellitus without complications: Secondary | ICD-10-CM | POA: Diagnosis not present

## 2013-05-23 DIAGNOSIS — M1990 Unspecified osteoarthritis, unspecified site: Secondary | ICD-10-CM | POA: Diagnosis not present

## 2013-05-23 DIAGNOSIS — Z006 Encounter for examination for normal comparison and control in clinical research program: Secondary | ICD-10-CM | POA: Diagnosis not present

## 2013-05-23 DIAGNOSIS — I10 Essential (primary) hypertension: Secondary | ICD-10-CM | POA: Diagnosis not present

## 2013-05-27 ENCOUNTER — Encounter: Payer: Self-pay | Admitting: Gastroenterology

## 2013-06-06 DIAGNOSIS — G4733 Obstructive sleep apnea (adult) (pediatric): Secondary | ICD-10-CM | POA: Diagnosis not present

## 2013-06-06 DIAGNOSIS — E119 Type 2 diabetes mellitus without complications: Secondary | ICD-10-CM | POA: Diagnosis not present

## 2013-06-06 DIAGNOSIS — I10 Essential (primary) hypertension: Secondary | ICD-10-CM | POA: Diagnosis not present

## 2013-06-19 DIAGNOSIS — E669 Obesity, unspecified: Secondary | ICD-10-CM | POA: Diagnosis not present

## 2013-06-19 DIAGNOSIS — E119 Type 2 diabetes mellitus without complications: Secondary | ICD-10-CM | POA: Diagnosis not present

## 2013-06-19 DIAGNOSIS — G4733 Obstructive sleep apnea (adult) (pediatric): Secondary | ICD-10-CM | POA: Diagnosis not present

## 2013-06-19 DIAGNOSIS — I10 Essential (primary) hypertension: Secondary | ICD-10-CM | POA: Diagnosis not present

## 2013-07-25 DIAGNOSIS — H35039 Hypertensive retinopathy, unspecified eye: Secondary | ICD-10-CM | POA: Diagnosis not present

## 2013-07-25 DIAGNOSIS — E119 Type 2 diabetes mellitus without complications: Secondary | ICD-10-CM | POA: Diagnosis not present

## 2013-07-25 DIAGNOSIS — H40019 Open angle with borderline findings, low risk, unspecified eye: Secondary | ICD-10-CM | POA: Diagnosis not present

## 2013-07-25 DIAGNOSIS — H2510 Age-related nuclear cataract, unspecified eye: Secondary | ICD-10-CM | POA: Diagnosis not present

## 2013-08-06 ENCOUNTER — Ambulatory Visit: Payer: Medicare Other | Admitting: *Deleted

## 2013-08-06 VITALS — Ht 73.0 in | Wt 247.8 lb

## 2013-08-06 DIAGNOSIS — Z86010 Personal history of colonic polyps: Secondary | ICD-10-CM

## 2013-08-06 MED ORDER — MOVIPREP 100 G PO SOLR: 1.0000 | Freq: Once | ORAL | Status: DC

## 2013-08-06 NOTE — Progress Notes (Signed)
Denies complications with anesthesia or sedation. Denies allergies to eggs or soy products. 

## 2013-08-12 DIAGNOSIS — E119 Type 2 diabetes mellitus without complications: Secondary | ICD-10-CM | POA: Diagnosis not present

## 2013-08-12 DIAGNOSIS — Z125 Encounter for screening for malignant neoplasm of prostate: Secondary | ICD-10-CM | POA: Diagnosis not present

## 2013-08-20 ENCOUNTER — Ambulatory Visit (AMBULATORY_SURGERY_CENTER): Payer: Medicare Other | Admitting: Gastroenterology

## 2013-08-20 ENCOUNTER — Encounter: Payer: Self-pay | Admitting: Gastroenterology

## 2013-08-20 VITALS — BP 140/72 | HR 62 | Temp 97.7°F | Resp 17 | Ht 73.0 in | Wt 247.0 lb

## 2013-08-20 DIAGNOSIS — K6289 Other specified diseases of anus and rectum: Secondary | ICD-10-CM

## 2013-08-20 DIAGNOSIS — Z800 Family history of malignant neoplasm of digestive organs: Secondary | ICD-10-CM

## 2013-08-20 DIAGNOSIS — Z860100 Personal history of colon polyps, unspecified: Secondary | ICD-10-CM

## 2013-08-20 DIAGNOSIS — K5730 Diverticulosis of large intestine without perforation or abscess without bleeding: Secondary | ICD-10-CM

## 2013-08-20 DIAGNOSIS — K627 Radiation proctitis: Secondary | ICD-10-CM

## 2013-08-20 LAB — GLUCOSE, CAPILLARY
Glucose-Capillary: 174 mg/dL — ABNORMAL HIGH (ref 70–99)
Glucose-Capillary: 143 mg/dL — ABNORMAL HIGH (ref 70–99)

## 2013-08-20 MED ORDER — SODIUM CHLORIDE 0.9 % IV SOLN: 500.0000 mL | INTRAVENOUS | Status: DC

## 2013-08-20 NOTE — Patient Instructions (Signed)
YOU HAD AN ENDOSCOPIC PROCEDURE TODAY AT THE [facility]: Refer to the procedure report that was given to you for any specific questions about what was found during the examination.  If the procedure report does not answer your questions, please call your gastroenterologist to clarify.  If you requested that your care partner not be given the details of your procedure findings, then the procedure report has been included in a sealed envelope for you to review at your convenience later.  YOU SHOULD EXPECT: Some feelings of bloating in the abdomen. Passage of more gas than usual.  Walking can help get rid of the air that was put into your GI tract during the procedure and reduce the bloating. If you had a lower endoscopy (such as a colonoscopy or flexible sigmoidoscopy) you may notice spotting of blood in your stool or on the toilet paper. If you underwent a bowel prep for your procedure, then you may not have a normal bowel movement for a few days.  DIET: Your first meal following the procedure should be a light meal and then it is ok to progress to your normal diet.  A half-sandwich or bowl of soup is an example of a good first meal.  Heavy or fried foods are harder to digest and may make you feel nauseous or bloated.  Likewise meals heavy in dairy and vegetables can cause extra gas to form and this can also increase the bloating.  Drink plenty of fluids but you should avoid alcoholic beverages for 24 hours.  ACTIVITY: Your care partner should take you home directly after the procedure.  You should plan to take it easy, moving slowly for the rest of the day.  You can resume normal activity the day after the procedure however you should NOT DRIVE or use heavy machinery for 24 hours (because of the sedation medicines used during the test).    SYMPTOMS TO REPORT IMMEDIATELY: A gastroenterologist can be reached at any hour.  During normal business hours, 8:30 AM to 5:00 PM Monday through Friday,  call [phone].  After hours and on weekends, please call the GI answering service at [phone] who will take a message and have the physician on call contact you.   Following lower endoscopy (colonoscopy or flexible sigmoidoscopy):  Excessive amounts of blood in the stool  Significant tenderness or worsening of abdominal pains  Swelling of the abdomen that is new, acute  Fever of 100F or higher    FOLLOW UP: If any biopsies were taken you will be contacted by phone or by letter within the next 1-3 weeks.  Call your gastroenterologist if you have not heard about the biopsies in 3 weeks.  Our staff will call the home number listed on your records the next business day following your procedure to check on you and address any questions or concerns that you may have at that time regarding the information given to you following your procedure. This is a courtesy call and so if there is no answer at the home number and we have not heard from you through the emergency physician on call, we will assume that you have returned to your regular daily activities without incident.  SIGNATURES/CONFIDENTIALITY: You and/or your care partner have signed paperwork which will be entered into your electronic medical record.  These signatures attest to the fact that that the information above on your After Visit Summary has been reviewed and is understood.  Full responsibility of the confidentiality   of this discharge information lies with you and/or your care-partner.     

## 2013-08-20 NOTE — Op Note (Signed)
[facility] [address] [place], [address]   COLONOSCOPY PROCEDURE REPORT  PATIENT: William [name], William [name].  MR#: [id number] BIRTHDATE: 04-28-1934 , 79  yrs. old GENDER: Male ENDOSCOPIST: Rachael Fee, MD PROCEDURE DATE:  08/20/2013 PROCEDURE:   Colonoscopy, surveillance First Screening Colonoscopy - Avg.  risk and is 50 yrs.  old or older - No.  Prior Negative Screening - Now for repeat screening. N/A  History of Adenoma - Now for follow-up colonoscopy & has been > or = to 3 yrs.  Yes hx of adenoma.  Has been 3 or more years since last colonoscopy.  Polyps Removed Today? No.  Recommend repeat exam, <10 yrs? No. ASA CLASS:   Class III INDICATIONS:adenomatous polyps 2010 (3 of them, the largest was >1cm), also father had colon cancer. MEDICATIONS: Fentanyl 75 mcg IV, Versed 7 mg IV, and These medications were titrated to patient response per physician's verbal order  DESCRIPTION OF PROCEDURE:   After the risks benefits and alternatives of the procedure were thoroughly explained, informed consent was obtained.  A digital rectal exam revealed no abnormalities of the rectum.   The LB [id number]  endoscope was introduced through the anus and advanced to the cecum, which was identified by both the appendix and ileocecal valve. No adverse events experienced.   The quality of the prep was good.  The instrument was then slowly withdrawn as the colon was fully examined.  COLON FINDINGS: There were several diverticulum in the left colon. 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  The examination was otherwise normal.  Retroflexed views revealed no abnormalities. The time to cecum=2 minutes 12 seconds.  Withdrawal time=6 minutes 59 seconds.  The scope was withdrawn and the procedure completed. COMPLICATIONS: There were no complications.  ENDOSCOPIC IMPRESSION: There were several diverticulum in the left colon. There was prominant radiation proctitis  (AVMs). The examination was otherwise normal.  No polyps or cancers  RECOMMENDATIONS: Given your age, you will not need another colonoscopy for colon cancer screening or polyp surveillance.  These types of tests usually stop around the age 28.   eSigned:  Rachael Fee, MD 08/20/2013 10:41 AM   cc:  Juline Patch, MD

## 2013-08-20 NOTE — Progress Notes (Signed)
Patient did not experience any of the following events: a burn prior to discharge; a fall within the facility; wrong site/side/patient/procedure/implant event; or a hospital transfer or hospital admission upon discharge from the facility. (G8907) Patient did not have preoperative order for IV antibiotic SSI prophylaxis. (G8918)  

## 2013-08-21 ENCOUNTER — Telehealth: Payer: Self-pay | Admitting: *Deleted

## 2013-08-21 NOTE — Telephone Encounter (Signed)
  Follow up Call-  Call back number 08/20/2013  Post procedure Call Back phone  # [phone]  Permission to leave phone message Yes     Patient questions:  Do you have a fever, pain , or abdominal swelling? no Pain Score  0 *  Have you tolerated food without any problems? yes  Have you been able to return to your normal activities? yes  Do you have any questions about your discharge instructions: Diet   no Medications  no Follow up visit  no  Do you have questions or concerns about your Care? no  Actions: * If pain score is 4 or above: No action needed, pain <4.  Pt. Had just left. Information provided via wife.

## 2013-09-09 ENCOUNTER — Telehealth: Payer: Self-pay | Admitting: Gastroenterology

## 2013-09-09 DIAGNOSIS — Q2730 Arteriovenous malformation, site unspecified: Secondary | ICD-10-CM

## 2013-09-10 ENCOUNTER — Other Ambulatory Visit: Payer: Self-pay

## 2013-09-10 DIAGNOSIS — Q2730 Arteriovenous malformation, site unspecified: Secondary | ICD-10-CM

## 2013-09-10 NOTE — Telephone Encounter (Signed)
Pt walked into the office and was instructed and meds reviewed he was given a copy of his instructions and advised to call with any questions

## 2013-09-10 NOTE — Telephone Encounter (Signed)
The bleeding is almost certainly from the radiation proctitis that I saw last month during colonoscopy.  HE needs flex sig, this Thursday, [facility] with APC to treat the radiation AVMs.

## 2013-09-10 NOTE — Telephone Encounter (Signed)
Pt will come by today for instructions

## 2013-09-10 NOTE — Telephone Encounter (Signed)
Pt  had diarrhea and rectal bleeding x 3 days, he has not seen any blood today.  Last episode was yesterday afternoon.  Bright red and had went through his underclothes and shorts.  No pain.  Please advise

## 2013-09-12 ENCOUNTER — Encounter (HOSPITAL_COMMUNITY)
Admission: RE | Disposition: A | Discharge status: Home / Self Care | Payer: Self-pay | Source: Ambulatory Visit | Attending: Gastroenterology

## 2013-09-12 ENCOUNTER — Ambulatory Visit (HOSPITAL_COMMUNITY)
Admission: RE | Admit: 2013-09-12 | Discharge: 2013-09-12 | Disposition: A | Discharge status: Home / Self Care | Payer: Medicare Other | Source: Ambulatory Visit | Attending: Gastroenterology | Admitting: Gastroenterology

## 2013-09-12 ENCOUNTER — Encounter (HOSPITAL_COMMUNITY): Payer: Self-pay

## 2013-09-12 ENCOUNTER — Telehealth: Payer: Self-pay

## 2013-09-12 DIAGNOSIS — E7800 Pure hypercholesterolemia, unspecified: Secondary | ICD-10-CM | POA: Diagnosis not present

## 2013-09-12 DIAGNOSIS — K625 Hemorrhage of anus and rectum: Secondary | ICD-10-CM | POA: Diagnosis not present

## 2013-09-12 DIAGNOSIS — E119 Type 2 diabetes mellitus without complications: Secondary | ICD-10-CM | POA: Insufficient documentation

## 2013-09-12 DIAGNOSIS — C61 Malignant neoplasm of prostate: Secondary | ICD-10-CM | POA: Diagnosis not present

## 2013-09-12 DIAGNOSIS — I10 Essential (primary) hypertension: Secondary | ICD-10-CM | POA: Insufficient documentation

## 2013-09-12 DIAGNOSIS — K6289 Other specified diseases of anus and rectum: Secondary | ICD-10-CM | POA: Diagnosis not present

## 2013-09-12 DIAGNOSIS — Q279 Congenital malformation of peripheral vascular system, unspecified: Secondary | ICD-10-CM

## 2013-09-12 DIAGNOSIS — Q2730 Arteriovenous malformation, site unspecified: Secondary | ICD-10-CM

## 2013-09-12 DIAGNOSIS — Y842 Radiological procedure and radiotherapy as the cause of abnormal reaction of the patient, or of later complication, without mention of misadventure at the time of the procedure: Secondary | ICD-10-CM | POA: Insufficient documentation

## 2013-09-12 HISTORY — PX: HOT HEMOSTASIS: SHX5433

## 2013-09-12 HISTORY — PX: FLEXIBLE SIGMOIDOSCOPY: SHX5431

## 2013-09-12 LAB — GLUCOSE, CAPILLARY: Glucose-Capillary: 162 mg/dL — ABNORMAL HIGH (ref 70–99)

## 2013-09-12 SURGERY — SIGMOIDOSCOPY, FLEXIBLE
Anesthesia: Moderate Sedation

## 2013-09-12 MED ORDER — MIDAZOLAM HCL 10 MG/2ML IJ SOLN
INTRAMUSCULAR | Status: DC | PRN
  Administered 2013-09-12 (×2): 2 mg via INTRAVENOUS

## 2013-09-12 MED ORDER — FENTANYL CITRATE 0.05 MG/ML IJ SOLN
INTRAMUSCULAR | Status: DC | PRN
  Administered 2013-09-12 (×2): 25 ug via INTRAVENOUS

## 2013-09-12 MED ORDER — MIDAZOLAM HCL 10 MG/2ML IJ SOLN
INTRAMUSCULAR | Status: AC
  Filled 2013-09-12: qty 2

## 2013-09-12 MED ORDER — FENTANYL CITRATE 0.05 MG/ML IJ SOLN
INTRAMUSCULAR | Status: AC
  Filled 2013-09-12: qty 2

## 2013-09-12 MED ORDER — SODIUM CHLORIDE 0.9 % IV SOLN
INTRAVENOUS | Status: DC
  Administered 2013-09-12: 500 mL via INTRAVENOUS

## 2013-09-12 NOTE — Op Note (Signed)
[facility] [address] [place], [address]   FLEX SIGMOIDOSCOPY PROCEDURE REPORT  PATIENT: William, [name].  MR#: [id number] BIRTHDATE: 1933-12-01 , 79  yrs. old GENDER: Male ENDOSCOPIST: Rachael Fee, MD PROCEDURE DATE:  09/12/2013 PROCEDURE:   Sigmoidoscopy with ablation therapy INDICATIONS:rectal bleeding, known radiation proctitis from prostate cancer treatment 3 years ago. MEDICATIONS: Fentanyl 50 mcg IV and Versed 4 mg IV  DESCRIPTION OF PROCEDURE:    Physical exam was performed.  Informed consent was obtained from the patient after explaining the benefits, risks, and alternatives to procedure.  The patient was connected to monitor and placed in left lateral position. Continuous oxygen was provided by nasal cannula and IV medicine administered through an indwelling cannula.  After administration of sedation and rectal exam, the patients rectum was intubated and the [id number] ([id number])  Colonoscope was advanced under direct visualization to the sigmoid colon.  The scope was removed slowly by carefully examining the color, texture, anatomy, and integrity mucosa on the way out.  The patient was recovered in endoscopy and discharged home in satisfactory condition.    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  This was treated with application of APC (40 Watts, treated with intermittent pulses).  The examination was otherwise normal. 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  This was treated with application of APC (40 Watts, treated with intermittent pulses).  The examination was otherwise normal.   RECOMMENDATIONS: My office will get in  touch with you to set up return office visit in 4 weeks to check on your response to this treatment.    _______________________________ eSigned:  Rachael Fee, MD 09/12/2013 9:47 AM

## 2013-09-12 NOTE — H&P (Signed)
  HPI: This is a man with rectal bleeding, recently found to have radiation related proctitis (AVMs)    Past Medical History  Diagnosis Date  . Hypertension   . Diabetes mellitus without complication   . Hypercholesteremia   . Arthritis   . Prostate cancer     Past Surgical History  Procedure Laterality Date  . Hernia repair    . Tonsillectomy    . Adenoidectomy    . Lipoma excision    . Other surgical history      s/p prostate radiation    Current Facility-Administered Medications  Medication Dose Route Frequency Provider Last Rate Last Dose  . 0.9 %  sodium chloride infusion   Intravenous Continuous Rachael Fee, MD        Allergies as of 09/10/2013 - Review Complete 08/20/2013  Allergen Reaction Noted  . Glyburide Other (See Comments) 08/06/2013    Family History  Problem Relation Age of Onset  . Colon cancer Father   . Esophageal cancer Neg Hx   . Rectal cancer Neg Hx   . Stomach cancer Neg Hx     History   Social History  . Marital Status: Married    Spouse Name: N/A    Number of Children: N/A  . Years of Education: N/A   Occupational History  . Not on file.   Social History Main Topics  . Smoking status: Former Smoker    Start date: 11/28/1982  . Smokeless tobacco: Never Used  . Alcohol Use: Yes     Comment: occ  . Drug Use: No  . Sexual Activity: Not on file   Other Topics Concern  . Not on file   Social History Narrative  . No narrative on file      Physical Exam: BP 153/91  Temp(Src) 97.8 F (36.6 C) (Oral)  Resp 17  SpO2 99% Constitutional: generally well-appearing Psychiatric: alert and oriented x3 Abdomen: soft, nontender, nondistended, no obvious ascites, no peritoneal signs, normal bowel sounds     Assessment and plan: 79 y.o. male with recatl bleeding   Flex sig today with likely apc to radiation AVMs in rectum

## 2013-09-12 NOTE — Telephone Encounter (Signed)
Message copied by Donata Duff on Thu Sep 12, 2013  9:59 AM ------      Message from: Rob Bunting P      Created: Thu Sep 12, 2013  9:49 AM       He needs rov in 4 weeks, thanks       ------

## 2013-09-12 NOTE — Telephone Encounter (Signed)
Letter mailed with ROV

## 2013-09-13 ENCOUNTER — Encounter (HOSPITAL_COMMUNITY): Payer: Self-pay | Admitting: Gastroenterology

## 2013-09-18 DIAGNOSIS — E7800 Pure hypercholesterolemia, unspecified: Secondary | ICD-10-CM | POA: Diagnosis not present

## 2013-09-18 DIAGNOSIS — E119 Type 2 diabetes mellitus without complications: Secondary | ICD-10-CM | POA: Diagnosis not present

## 2013-09-18 DIAGNOSIS — I10 Essential (primary) hypertension: Secondary | ICD-10-CM | POA: Diagnosis not present

## 2013-09-19 DIAGNOSIS — M1710 Unilateral primary osteoarthritis, unspecified knee: Secondary | ICD-10-CM | POA: Diagnosis not present

## 2013-09-20 DIAGNOSIS — E119 Type 2 diabetes mellitus without complications: Secondary | ICD-10-CM | POA: Diagnosis not present

## 2013-10-03 DIAGNOSIS — H903 Sensorineural hearing loss, bilateral: Secondary | ICD-10-CM | POA: Diagnosis not present

## 2013-10-11 ENCOUNTER — Ambulatory Visit (INDEPENDENT_AMBULATORY_CARE_PROVIDER_SITE_OTHER): Payer: Medicare Other | Admitting: Gastroenterology

## 2013-10-11 ENCOUNTER — Encounter: Payer: Self-pay | Admitting: Gastroenterology

## 2013-10-11 VITALS — BP 130/58 | HR 72 | Ht 73.0 in | Wt 245.0 lb

## 2013-10-11 DIAGNOSIS — K627 Radiation proctitis: Secondary | ICD-10-CM

## 2013-10-11 DIAGNOSIS — K6289 Other specified diseases of anus and rectum: Secondary | ICD-10-CM | POA: Diagnosis not present

## 2013-10-11 NOTE — Patient Instructions (Signed)
Call if you have recurrent, persistent GI bleeding.

## 2013-10-11 NOTE — Progress Notes (Signed)
Review of pertinent gastrointestinal problems: 1. several adenomatous colon polyps colonoscopy 2010, Dr. Christella Hartigan, one of the polyps was greater than 1 cm. Repeat colonoscopy September 2014 showed no recurrent polyps however he had diverticulosis. Also noted was significant radiation proctitis. 2. radiation proctitis from a prostate radiation 2011, started to bleed October 2014. This was treated sigmoidoscopy clear with APC treatment, Dr. Christella Hartigan October 2014    HPI: This is a  very pleasant 79 year old man whom I last saw, a sigmoidoscopy about a month or so ago.  He is here with his wife today  He  had for 5 days of minor rectal bleeding after the sigmoidoscopy and then the bleeding has completely ceased.    Past Medical History  Diagnosis Date  . Hypertension   . Diabetes mellitus without complication   . Hypercholesteremia   . Arthritis   . Prostate cancer     Past Surgical History  Procedure Laterality Date  . Hernia repair    . Tonsillectomy    . Adenoidectomy    . Lipoma excision    . Other surgical history      s/p prostate radiation  . Flexible sigmoidoscopy N/A 09/12/2013    Procedure: FLEXIBLE SIGMOIDOSCOPY;  Surgeon: Rachael Fee, MD;  Location: [facility];  Service: Endoscopy;  Laterality: N/A;  . Hot hemostasis N/A 09/12/2013    Procedure: HOT HEMOSTASIS (ARGON PLASMA COAGULATION/BICAP);  Surgeon: Rachael Fee, MD;  Location: [facility];  Service: Endoscopy;  Laterality: N/A;    Current Outpatient Prescriptions  Medication Sig Dispense Refill  . acetaminophen (TYLENOL) 500 MG tablet Take 1,000 mg by mouth every 6 (six) hours as needed for pain.      Marland Kitchen aspirin 81 MG tablet Take 81 mg by mouth daily.      Marland Kitchen co-enzyme Q-10 30 MG capsule Take 300 mg by mouth daily.      . diclofenac (VOLTAREN) 75 MG EC tablet Take 75 mg by mouth 2 (two) times daily.      Marland Kitchen glucosamine-chondroitin 500-400 MG tablet Take 3 tablets by mouth 2 (two) times daily.      Marland Kitchen  LEVEMIR FLEXTOUCH 100 UNIT/ML SOPN       . lisinopril-hydrochlorothiazide (PRINZIDE,ZESTORETIC) 10-12.5 MG per tablet Take 1 tablet by mouth daily.      . metFORMIN (GLUCOPHAGE) 1000 MG tablet Take 1,000 mg by mouth 2 (two) times daily with a meal.      . pravastatin (PRAVACHOL) 40 MG tablet Take 40 mg by mouth daily.      . saxagliptin HCl (ONGLYZA) 2.5 MG TABS tablet Take 5 mg by mouth daily.       No current facility-administered medications for this visit.    Allergies as of 10/11/2013 - Review Complete 10/11/2013  Allergen Reaction Noted  . Glyburide Other (See Comments) 08/06/2013    Family History  Problem Relation Age of Onset  . Colon cancer Father   . Esophageal cancer Neg Hx   . Rectal cancer Neg Hx   . Stomach cancer Neg Hx     History   Social History  . Marital Status: Married    Spouse Name: N/A    Number of Children: N/A  . Years of Education: N/A   Occupational History  . Not on file.   Social History Main Topics  . Smoking status: Former Smoker    Start date: 11/28/1982  . Smokeless tobacco: Never Used  . Alcohol Use: Yes     Comment: occ  .  Drug Use: No  . Sexual Activity: Not on file   Other Topics Concern  . Not on file   Social History Narrative  . No narrative on file      Physical Exam: BP 130/58  Pulse 72  Ht 6\' 1"  (1.854 m)  Wt 245 lb (111.131 kg)  BMI 32.33 kg/m2 Constitutional: generally well-appearing Psychiatric: alert and oriented x3 Abdomen: soft, nontender, nondistended, no obvious ascites, no peritoneal signs, normal bowel sounds     Assessment and plan: 79 y.o. male with treated radiation proctitis  He knows to call here if he has recurrent persistent rectal bleeding and sigmoidoscopy could be repeated with argon plasma coagulation.

## 2013-12-19 DIAGNOSIS — E119 Type 2 diabetes mellitus without complications: Secondary | ICD-10-CM | POA: Diagnosis not present

## 2013-12-24 DIAGNOSIS — E119 Type 2 diabetes mellitus without complications: Secondary | ICD-10-CM | POA: Diagnosis not present

## 2013-12-24 DIAGNOSIS — E663 Overweight: Secondary | ICD-10-CM | POA: Diagnosis not present

## 2013-12-24 DIAGNOSIS — E785 Hyperlipidemia, unspecified: Secondary | ICD-10-CM | POA: Diagnosis not present

## 2013-12-24 DIAGNOSIS — I10 Essential (primary) hypertension: Secondary | ICD-10-CM | POA: Diagnosis not present

## 2013-12-24 DIAGNOSIS — Z23 Encounter for immunization: Secondary | ICD-10-CM | POA: Diagnosis not present

## 2014-02-12 DIAGNOSIS — M25569 Pain in unspecified knee: Secondary | ICD-10-CM | POA: Diagnosis not present

## 2014-02-12 DIAGNOSIS — M1710 Unilateral primary osteoarthritis, unspecified knee: Secondary | ICD-10-CM | POA: Diagnosis not present

## 2014-02-12 DIAGNOSIS — IMO0002 Reserved for concepts with insufficient information to code with codable children: Secondary | ICD-10-CM | POA: Diagnosis not present

## 2014-02-17 DIAGNOSIS — N400 Enlarged prostate without lower urinary tract symptoms: Secondary | ICD-10-CM | POA: Diagnosis not present

## 2014-02-17 DIAGNOSIS — E119 Type 2 diabetes mellitus without complications: Secondary | ICD-10-CM | POA: Diagnosis not present

## 2014-02-17 DIAGNOSIS — Z01818 Encounter for other preprocedural examination: Secondary | ICD-10-CM | POA: Diagnosis not present

## 2014-02-17 DIAGNOSIS — I440 Atrioventricular block, first degree: Secondary | ICD-10-CM | POA: Diagnosis not present

## 2014-02-17 DIAGNOSIS — Z01810 Encounter for preprocedural cardiovascular examination: Secondary | ICD-10-CM | POA: Diagnosis not present

## 2014-02-18 LAB — PROTIME-INR

## 2014-02-18 NOTE — Progress Notes (Signed)
Surgery on 03/25/14.  Preop on 03/17/14.  Need orders in EPIC.  Thank You.

## 2014-03-14 ENCOUNTER — Other Ambulatory Visit (HOSPITAL_COMMUNITY): Payer: Self-pay | Admitting: Orthopedic Surgery

## 2014-03-14 NOTE — Patient Instructions (Addendum)
      Your procedure is scheduled on:  03/25/14 TUESDAY  Report to [place] at [place]      AM.  Call this number if you have problems the morning of surgery: [phone]        Do not eat food  Or drink :After Midnight. Monday NIGHT   Take these medicines the morning of surgery with A SIP OF WATER:NONE DO NOT TAKE ANY INSULIN Tuesday MORNING, or diabetic pills  .  Contacts, dentures or partial plates, or metal hairpins  can not be worn to surgery. Your family will be responsible for glasses, dentures, hearing aides while you are in surgery  Leave suitcase in the car. After surgery it may be brought to your room.  For patients admitted to the hospital, checkout time is 11:00 AM day of  discharge.                DO NOT WEAR JEWELRY, LOTIONS, POWDERS, OR PERFUMES.  WOMEN-- DO NOT SHAVE LEGS OR UNDERARMS FOR 48 HOURS BEFORE SHOWERS. MEN MAY SHAVE FACE.  Patients discharged the day of surgery will not be allowed to drive home. IF going home the day of surgery, you must have a driver and someone to stay with you for the first 24 hours  Name and phone number of your driver:   admission                                                                     Please read over the following fact sheets that you were given: MRSA Information, Incentive Spirometry Sheet, Blood Transfusion Sheet  Information                     FAILURE TO [place]                                                  Patient Signature _____________________________

## 2014-03-14 NOTE — Progress Notes (Signed)
EKG with labs, clearance and OV Dr Minna Antis 3/15 on chart

## 2014-03-17 ENCOUNTER — Encounter (INDEPENDENT_AMBULATORY_CARE_PROVIDER_SITE_OTHER): Payer: Self-pay

## 2014-03-17 ENCOUNTER — Encounter (HOSPITAL_COMMUNITY): Payer: Self-pay | Admitting: Pharmacy Technician

## 2014-03-17 ENCOUNTER — Encounter (HOSPITAL_COMMUNITY)
Admission: RE | Admit: 2014-03-17 | Discharge: 2014-03-17 | Disposition: A | Discharge status: Home / Self Care | Payer: Medicare Other | Source: Ambulatory Visit | Attending: Orthopedic Surgery | Admitting: Orthopedic Surgery

## 2014-03-17 ENCOUNTER — Ambulatory Visit (HOSPITAL_COMMUNITY)
Admission: RE | Admit: 2014-03-17 | Discharge: 2014-03-17 | Disposition: A | Discharge status: Home / Self Care | Payer: Medicare Other | Source: Ambulatory Visit | Attending: Anesthesiology | Admitting: Anesthesiology

## 2014-03-17 ENCOUNTER — Encounter (HOSPITAL_COMMUNITY): Payer: Self-pay

## 2014-03-17 DIAGNOSIS — Z01812 Encounter for preprocedural laboratory examination: Secondary | ICD-10-CM | POA: Diagnosis not present

## 2014-03-17 DIAGNOSIS — R030 Elevated blood-pressure reading, without diagnosis of hypertension: Secondary | ICD-10-CM | POA: Diagnosis not present

## 2014-03-17 DIAGNOSIS — Z01818 Encounter for other preprocedural examination: Secondary | ICD-10-CM | POA: Insufficient documentation

## 2014-03-17 HISTORY — DX: Sleep apnea, unspecified: G47.30

## 2014-03-17 LAB — CBC
HCT: 43.5 % (ref 39.0–52.0)
Hemoglobin: 14.8 g/dL (ref 13.0–17.0)
MCH: 31.6 pg (ref 26.0–34.0)
MCHC: 34 g/dL (ref 30.0–36.0)
MCV: 92.9 fL (ref 78.0–100.0)
PLATELETS: 205 10*3/uL (ref 150–400)
RBC: 4.68 MIL/uL (ref 4.22–5.81)
RDW: 13.6 % (ref 11.5–15.5)
WBC: 9.6 10*3/uL (ref 4.0–10.5)

## 2014-03-17 LAB — BASIC METABOLIC PANEL
BUN: 31 mg/dL — ABNORMAL HIGH (ref 6–23)
CALCIUM: 10.1 mg/dL (ref 8.4–10.5)
CO2: 25 mEq/L (ref 19–32)
Chloride: 103 mEq/L (ref 96–112)
Creatinine, Ser: 1.03 mg/dL (ref 0.50–1.35)
GFR calc Af Amer: 79 mL/min — ABNORMAL LOW (ref >90)
GFR, EST NON AFRICAN AMERICAN: 68 mL/min — AB (ref >90)
GLUCOSE: 142 mg/dL — AB (ref 70–99)
Potassium: 5.1 mEq/L (ref 3.7–5.3)
Sodium: 141 mEq/L (ref 137–147)

## 2014-03-17 LAB — URINALYSIS, ROUTINE W REFLEX MICROSCOPIC
BILIRUBIN URINE: NEGATIVE
GLUCOSE, UA: NEGATIVE mg/dL
Ketones, ur: NEGATIVE mg/dL
Leukocytes, UA: NEGATIVE
Nitrite: NEGATIVE
Protein, ur: NEGATIVE mg/dL
SPECIFIC GRAVITY, URINE: 1.023 (ref 1.005–1.030)
Urobilinogen, UA: 0.2 mg/dL (ref 0.0–1.0)
pH: 5 (ref 5.0–8.0)

## 2014-03-17 LAB — APTT: aPTT: 31 seconds (ref 24–37)

## 2014-03-17 LAB — PROTIME-INR
INR: 0.91 (ref 0.00–1.49)
Prothrombin Time: 12.1 seconds (ref 11.6–15.2)

## 2014-03-17 LAB — URINE MICROSCOPIC-ADD ON

## 2014-03-17 LAB — SURGICAL PCR SCREEN
MRSA, PCR: NEGATIVE
Staphylococcus aureus: POSITIVE — AB

## 2014-03-17 LAB — ABO/RH: ABO/RH(D): A POS

## 2014-03-17 NOTE — Progress Notes (Signed)
EKG  2007 on chart for reference.  Faxed BMP, urine with micro to Dr Alvan Dame via [facility]

## 2014-03-18 ENCOUNTER — Encounter: Payer: Self-pay | Admitting: Cardiology

## 2014-03-18 DIAGNOSIS — I10 Essential (primary) hypertension: Secondary | ICD-10-CM | POA: Insufficient documentation

## 2014-03-18 DIAGNOSIS — C61 Malignant neoplasm of prostate: Secondary | ICD-10-CM | POA: Insufficient documentation

## 2014-03-18 DIAGNOSIS — E7800 Pure hypercholesterolemia, unspecified: Secondary | ICD-10-CM | POA: Insufficient documentation

## 2014-03-18 DIAGNOSIS — E119 Type 2 diabetes mellitus without complications: Secondary | ICD-10-CM

## 2014-03-18 DIAGNOSIS — M1990 Unspecified osteoarthritis, unspecified site: Secondary | ICD-10-CM | POA: Insufficient documentation

## 2014-03-18 DIAGNOSIS — G4730 Sleep apnea, unspecified: Secondary | ICD-10-CM | POA: Insufficient documentation

## 2014-03-18 DIAGNOSIS — Z794 Long term (current) use of insulin: Secondary | ICD-10-CM

## 2014-03-18 DIAGNOSIS — IMO0001 Reserved for inherently not codable concepts without codable children: Secondary | ICD-10-CM | POA: Insufficient documentation

## 2014-03-18 NOTE — Progress Notes (Signed)
ekg from 3/15 and 2007 reviewed by Dr Marcell Barlow and Dr Kalman Shan-  Cardio consult needed.  Notified Sherry at Dr Honor Loh office and faxed EKG'S to her

## 2014-03-18 NOTE — Progress Notes (Signed)
Per Judeen Hammans at Dr Honor Loh office- pt has appt Dr Johnsie Cancel tomorrow

## 2014-03-19 ENCOUNTER — Encounter: Payer: Self-pay | Admitting: Cardiovascular Disease

## 2014-03-19 ENCOUNTER — Ambulatory Visit (INDEPENDENT_AMBULATORY_CARE_PROVIDER_SITE_OTHER): Payer: Medicare Other | Admitting: Cardiovascular Disease

## 2014-03-19 VITALS — BP 147/88 | HR 80 | Ht 73.0 in | Wt 242.1 lb

## 2014-03-19 DIAGNOSIS — E7800 Pure hypercholesterolemia, unspecified: Secondary | ICD-10-CM

## 2014-03-19 DIAGNOSIS — E119 Type 2 diabetes mellitus without complications: Secondary | ICD-10-CM | POA: Diagnosis not present

## 2014-03-19 DIAGNOSIS — Z01818 Encounter for other preprocedural examination: Secondary | ICD-10-CM

## 2014-03-19 DIAGNOSIS — Z01810 Encounter for preprocedural cardiovascular examination: Secondary | ICD-10-CM

## 2014-03-19 DIAGNOSIS — R9431 Abnormal electrocardiogram [ECG] [EKG]: Secondary | ICD-10-CM

## 2014-03-19 DIAGNOSIS — I10 Essential (primary) hypertension: Secondary | ICD-10-CM

## 2014-03-19 DIAGNOSIS — Z7189 Other specified counseling: Secondary | ICD-10-CM | POA: Insufficient documentation

## 2014-03-19 DIAGNOSIS — C61 Malignant neoplasm of prostate: Secondary | ICD-10-CM

## 2014-03-19 NOTE — Progress Notes (Signed)
Patient ID: William [name], male   DOB: 10-09-1934, 80 y.o.   MRN: [id number]   80 yo referred by Dr Alvan Dame for preop clearance Has left TKR scheduled for 4/28 and will have right one done 6 weeks latter . No cardiac history .  No chest pain palpitations or dyspnea.  Travels to [place] a lot and no issues  Has not had surgery in over 25 year.s  Denies issues with easy bruising or anesthesia.  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  Has had injections and knee pain has been progressive requiring TKR  Has never needed/had ETT.  ECG preop showed minor change PR 328  With LAD       ROS: Denies fever, malais, weight loss, blurry vision, decreased visual acuity, cough, sputum, SOB, hemoptysis, pleuritic pain, palpitaitons, heartburn, abdominal pain, melena, lower extremity edema, claudication, or rash.  All other systems reviewed and negative   General: Affect appropriate Healthy:  appears stated age 89: normal Neck supple with no adenopathy JVP normal no bruits no thyromegaly Lungs clear with no wheezing and good diaphragmatic motion Heart:  S1/S2 no murmur,rub, gallop or click PMI normal Abdomen: benighn, BS positve, no tenderness, no AAA no bruit.  No HSM or HJR Distal pulses intact with no bruits No edema Neuro non-focal Skin warm and dry No muscular weakness  Medications Current Outpatient Prescriptions  Medication Sig Dispense Refill  . aspirin EC 81 MG tablet Take 81 mg by mouth every evening.      Marland Kitchen co-enzyme Q-10 30 MG capsule Take 300 mg by mouth daily.      . diclofenac (VOLTAREN) 75 MG EC tablet Take 75 mg by mouth 2 (two) times daily.      . diclofenac sodium (VOLTAREN) 1 % GEL Apply 1 application topically 4 (four) times daily as needed (Applied to knees).      . Glucosamine-Chondroit-Vit C-Mn (GLUCOSAMINE CHONDR 1500  COMPLX) CAPS Take 1 capsule by mouth 2 (two) times daily.      Marland Kitchen LEVEMIR FLEXTOUCH 100 UNIT/ML SOPN Inject 13 Units into the skin daily before lunch. BEFORE LUNCH      . lisinopril-hydrochlorothiazide (PRINZIDE,ZESTORETIC) 10-12.5 MG per tablet Take 1 tablet by mouth every morning.       . metFORMIN (GLUCOPHAGE) 1000 MG tablet Take 1,000 mg by mouth 2 (two) times daily with a meal.      . pravastatin (PRAVACHOL) 40 MG tablet Take 40 mg by mouth daily with supper.       . saxagliptin HCl (ONGLYZA) 2.5 MG TABS tablet Take 5 mg by mouth daily.       No current facility-administered medications for this visit.    Allergies Glyburide  Family History: Family History  Problem Relation Age of Onset  . Colon cancer Father   . Esophageal cancer Neg Hx   . Rectal cancer Neg Hx   . Stomach cancer Neg Hx     Social History: History   Social History  . Marital Status: Married    Spouse Name: N/A    Number of Children: N/A  . Years of Education: N/A   Occupational History  . Not on file.   Social History Main Topics  . Smoking status: Former Smoker    Start date: 11/28/1982  . Smokeless tobacco: Never Used  .  Alcohol Use: Yes     Comment: occ  . Drug Use: No  . Sexual Activity: Not on file   Other Topics Concern  . Not on file   Social History Narrative  . No narrative on file    Electrocardiogram: SR rate 79 PR 328 LVH LAD poor R wave progression   Assessment and Plan

## 2014-03-19 NOTE — Assessment & Plan Note (Signed)
Read as old IMI/AMI but to my eye has LAD and poor R wave progression.  Minor risk of AV block during surgery and post op phase especially if pain is not well controlled and he gets vagal.  Post of Telemetry and ECG in order.  Avoid AV nodal blocking drugs.  ECG q 6 months

## 2014-03-19 NOTE — Assessment & Plan Note (Signed)
S/P radioactive seed implant improved /[facility] urology

## 2014-03-19 NOTE — Patient Instructions (Signed)
Your physician has given you cardiac clearance for surgery and will send his note to your surgeon, Dr. Alvan Dame

## 2014-03-19 NOTE — Progress Notes (Signed)
Per Dr Johnsie Cancel note 03/19/14 epic pt is cleared for surgery

## 2014-03-19 NOTE — Assessment & Plan Note (Signed)
Discussed low carb diet.  Target hemoglobin A1c is 6.5 or less.  Continue current medications.  

## 2014-03-19 NOTE — Assessment & Plan Note (Signed)
Asymptomatic and active with moderate to low risk TKR surgery .  Guidelines suggest no testing needed Clear to have surgery next week

## 2014-03-19 NOTE — Assessment & Plan Note (Signed)
Well controlled.  Continue current medications and low sodium Dash type diet.   Avoid AV nodal blocking drugs with long PR interval

## 2014-03-19 NOTE — H&P (Signed)
TOTAL KNEE ADMISSION H&P  Patient is being admitted for left total knee arthroplasty.  Subjective:  Chief Complaint:     Left knee OA / pain.  HPI: William [name], 80 y.o. male, has a history of pain and functional disability in the left knee due to arthritis and has failed non-surgical conservative treatments for greater than 12 weeks to include  NSAID's and/or analgesics, corticosteriod injections, use of assistive devices and activity modification.  Onset of symptoms was gradual, starting >10 years ago with gradually worsening course since that time. The patient noted no past surgery on the left knee(s).  Patient currently rates pain in the left knee(s) at 7 out of 10 with activity. Patient has worsening of pain with activity and weight bearing, pain that interferes with activities of daily living, pain with passive range of motion, crepitus and joint swelling.  Patient has evidence of periarticular osteophytes and joint space narrowing by imaging studies.   There is no active infection.  Risks, benefits and expectations were discussed with the patient.  Risks including but not limited to the risk of anesthesia, blood clots, nerve damage, blood vessel damage, failure of the prosthesis, infection and up to and including death.  Patient understand the risks, benefits and expectations and wishes to proceed with surgery.   D/C Plans:   Home with HHPT  Post-op Meds:   No Rx given  Tranexamic Acid:   To be given  Decadron:     Not to be given - DM  FYI:     ASA post-op  Norco post-op   Patient Active Problem List   Diagnosis Date Noted  . Preop cardiovascular exam 03/19/2014  . Abnormal ECG 03/19/2014  . Hypertension   . Diabetes mellitus without complication   . Hypercholesteremia   . Arthritis   . Prostate cancer   . Sleep apnea    Past Medical History  Diagnosis Date  . Hypertension   . Diabetes mellitus without complication   . Hypercholesteremia   . Arthritis   . Prostate  cancer     with radiation treatment  . Sleep apnea 2/14    "mild per patient" hasnt gotten CPAP machine    Past Surgical History  Procedure Laterality Date  . Hernia repair    . Tonsillectomy    . Adenoidectomy    . Lipoma excision    . Other surgical history      s/p prostate radiation  . Flexible sigmoidoscopy N/A 09/12/2013    Procedure: FLEXIBLE SIGMOIDOSCOPY;  Surgeon: Milus Banister, MD;  Location: [facility];  Service: Endoscopy;  Laterality: N/A;  . Hot hemostasis N/A 09/12/2013    Procedure: HOT HEMOSTASIS (ARGON PLASMA COAGULATION/BICAP);  Surgeon: Milus Banister, MD;  Location: [facility];  Service: Endoscopy;  Laterality: N/A;    No prescriptions prior to admission   Allergies  Allergen Reactions  . Glyburide Other (See Comments)    Hypoglycemia     History  Substance Use Topics  . Smoking status: Former Smoker    Start date: 11/28/1982  . Smokeless tobacco: Never Used  . Alcohol Use: Yes     Comment: occ    Family History  Problem Relation Age of Onset  . Colon cancer Father   . Esophageal cancer Neg Hx   . Rectal cancer Neg Hx   . Stomach cancer Neg Hx      Review of Systems  Constitutional: Negative.   HENT: Negative.   Eyes: Negative.   Respiratory:  Negative.   Cardiovascular: Negative.   Gastrointestinal: Negative.   Genitourinary: Positive for frequency.  Musculoskeletal: Positive for joint pain.  Skin: Negative.   Neurological: Negative.   Endo/Heme/Allergies: Negative.   Psychiatric/Behavioral: Negative.     Objective:  Physical Exam  Constitutional: He is oriented to person, place, and time. He appears well-developed and well-nourished.  HENT:  Head: Normocephalic and atraumatic.  Mouth/Throat: Oropharynx is clear and moist.  Eyes: Pupils are equal, round, and reactive to light.  Neck: Neck supple. No JVD present. No tracheal deviation present. No thyromegaly present.  Cardiovascular: Normal rate, regular rhythm, normal  heart sounds and intact distal pulses.   Respiratory: Effort normal and breath sounds normal. No stridor. No respiratory distress. He has no wheezes.  GI: Soft. There is no tenderness. There is no guarding.  Musculoskeletal:       Right knee: He exhibits decreased range of motion, swelling and bony tenderness. He exhibits no ecchymosis, no deformity, no laceration and no erythema. Tenderness found.       Left knee: He exhibits decreased range of motion, swelling and bony tenderness. He exhibits no ecchymosis, no deformity, no laceration and no erythema. Tenderness found.  Lymphadenopathy:    He has no cervical adenopathy.  Neurological: He is oriented to person, place, and time.  Skin: Skin is warm and dry.  Psychiatric: He has a normal mood and affect.    Vital signs in last 24 hours: Pulse Rate:  [80] 80 (04/22 0948) BP: (147)/(88) 147/88 mmHg (04/22 0948) Weight:  [109.825 kg (242 lb 1.9 oz)] 109.825 kg (242 lb 1.9 oz) (04/22 0948)  Labs:   Estimated body mass index is 32.33 kg/(m^2) as calculated from the following:   Height as of 10/11/13: 6\' 1"  (1.854 m).   Weight as of 10/11/13: 111.131 kg (245 lb).   Imaging Review Plain radiographs demonstrate severe degenerative joint disease of the bilaterally knee(s). The overall alignment is  neutral. The bone quality appears to be good for age and reported activity level.  Assessment/Plan:  End stage arthritis, bilaterally knee   The patient history, physical examination, clinical judgment of the provider and imaging studies are consistent with end stage degenerative joint disease of the left knee(s) and total knee arthroplasty is deemed medically necessary. The treatment options including medical management, injection therapy arthroscopy and arthroplasty were discussed at length. The risks and benefits of total knee arthroplasty were presented and reviewed. The risks due to aseptic loosening, infection, stiffness, patella tracking  problems, thromboembolic complications and other imponderables were discussed. The patient acknowledged the explanation, agreed to proceed with the plan and consent was signed. Patient is being admitted for inpatient treatment for surgery, pain control, PT, OT, prophylactic antibiotics, VTE prophylaxis, progressive ambulation and ADL's and discharge planning. The patient is planning to be discharged home with home health services.      West Pugh Fizza Scales   PAC  03/19/2014, 3:06 PM

## 2014-03-19 NOTE — Assessment & Plan Note (Signed)
Cholesterol is at goal.  Continue current dose of statin and diet Rx.  No myalgias or side effects.  F/U  LFT's in 6 months. No results found for this basename: [place]  Lavs with primary With DM target LDL 100 or less

## 2014-03-25 ENCOUNTER — Encounter (HOSPITAL_COMMUNITY): Payer: Medicare Other | Admitting: Anesthesiology

## 2014-03-25 ENCOUNTER — Encounter (HOSPITAL_COMMUNITY)
Admission: RE | Disposition: A | Discharge status: Home Health | Payer: Self-pay | Source: Ambulatory Visit | Attending: Orthopedic Surgery

## 2014-03-25 ENCOUNTER — Inpatient Hospital Stay (HOSPITAL_COMMUNITY): Payer: Medicare Other | Admitting: Anesthesiology

## 2014-03-25 ENCOUNTER — Encounter (HOSPITAL_COMMUNITY): Payer: Self-pay | Admitting: *Deleted

## 2014-03-25 ENCOUNTER — Inpatient Hospital Stay (HOSPITAL_COMMUNITY)
Admission: RE | Admit: 2014-03-25 | Discharge: 2014-03-26 | DRG: 470 | Disposition: A | Discharge status: Home Health | Payer: Medicare Other | Source: Ambulatory Visit | Attending: Orthopedic Surgery | Admitting: Orthopedic Surgery

## 2014-03-25 DIAGNOSIS — Z79899 Other long term (current) drug therapy: Secondary | ICD-10-CM

## 2014-03-25 DIAGNOSIS — Z6831 Body mass index (BMI) 31.0-31.9, adult: Secondary | ICD-10-CM | POA: Diagnosis not present

## 2014-03-25 DIAGNOSIS — G4730 Sleep apnea, unspecified: Secondary | ICD-10-CM | POA: Diagnosis present

## 2014-03-25 DIAGNOSIS — Z8546 Personal history of malignant neoplasm of prostate: Secondary | ICD-10-CM | POA: Diagnosis not present

## 2014-03-25 DIAGNOSIS — E119 Type 2 diabetes mellitus without complications: Secondary | ICD-10-CM | POA: Diagnosis present

## 2014-03-25 DIAGNOSIS — M1710 Unilateral primary osteoarthritis, unspecified knee: Principal | ICD-10-CM | POA: Diagnosis present

## 2014-03-25 DIAGNOSIS — Z800 Family history of malignant neoplasm of digestive organs: Secondary | ICD-10-CM

## 2014-03-25 DIAGNOSIS — I10 Essential (primary) hypertension: Secondary | ICD-10-CM | POA: Diagnosis not present

## 2014-03-25 DIAGNOSIS — M179 Osteoarthritis of knee, unspecified: Secondary | ICD-10-CM | POA: Diagnosis present

## 2014-03-25 DIAGNOSIS — IMO0002 Reserved for concepts with insufficient information to code with codable children: Secondary | ICD-10-CM | POA: Diagnosis not present

## 2014-03-25 DIAGNOSIS — Z87891 Personal history of nicotine dependence: Secondary | ICD-10-CM | POA: Diagnosis not present

## 2014-03-25 DIAGNOSIS — E7800 Pure hypercholesterolemia, unspecified: Secondary | ICD-10-CM | POA: Diagnosis present

## 2014-03-25 HISTORY — PX: TOTAL KNEE ARTHROPLASTY: SHX125

## 2014-03-25 LAB — TYPE AND SCREEN
ABO/RH(D): A POS
Antibody Screen: NEGATIVE

## 2014-03-25 LAB — GLUCOSE, CAPILLARY
GLUCOSE-CAPILLARY: 128 mg/dL — AB (ref 70–99)
Glucose-Capillary: 147 mg/dL — ABNORMAL HIGH (ref 70–99)
Glucose-Capillary: 99 mg/dL (ref 70–99)
Glucose-Capillary: 142 mg/dL — ABNORMAL HIGH (ref 70–99)

## 2014-03-25 SURGERY — ARTHROPLASTY, KNEE, TOTAL
Anesthesia: Spinal | Site: Knee | Laterality: Left

## 2014-03-25 MED ORDER — METFORMIN HCL 500 MG PO TABS
1000.0000 mg | ORAL_TABLET | Freq: Two times a day (BID) | ORAL | Status: DC
  Filled 2014-03-25: qty 2

## 2014-03-25 MED ORDER — TRANEXAMIC ACID 100 MG/ML IV SOLN
1000.0000 mg | Freq: Once | INTRAVENOUS | Status: AC
  Administered 2014-03-25: 1000 mg via INTRAVENOUS
  Filled 2014-03-25: qty 10

## 2014-03-25 MED ORDER — FENTANYL CITRATE 0.05 MG/ML IJ SOLN
INTRAMUSCULAR | Status: AC
  Filled 2014-03-25: qty 2

## 2014-03-25 MED ORDER — POTASSIUM CHLORIDE 2 MEQ/ML IV SOLN
INTRAVENOUS | Status: DC
  Administered 2014-03-25: 17:00:00 via INTRAVENOUS
  Filled 2014-03-25 (×8): qty 1000

## 2014-03-25 MED ORDER — METOCLOPRAMIDE HCL 10 MG PO TABS: 5.0000 mg | ORAL_TABLET | Freq: Three times a day (TID) | ORAL | Status: DC | PRN

## 2014-03-25 MED ORDER — KETOROLAC TROMETHAMINE 30 MG/ML IJ SOLN
INTRAMUSCULAR | Status: DC | PRN
  Administered 2014-03-25: 30 mg

## 2014-03-25 MED ORDER — CEFAZOLIN SODIUM-DEXTROSE 2-3 GM-% IV SOLR
INTRAVENOUS | Status: AC
  Filled 2014-03-25: qty 50

## 2014-03-25 MED ORDER — BUPIVACAINE LIPOSOME 1.3 % IJ SUSP
20.0000 mL | Freq: Once | INTRAMUSCULAR | Status: AC
  Administered 2014-03-25: 20 mL
  Filled 2014-03-25: qty 20

## 2014-03-25 MED ORDER — DEXTROSE 5 % IV SOLN
500.0000 mg | Freq: Four times a day (QID) | INTRAVENOUS | Status: DC | PRN
  Administered 2014-03-25: 500 mg via INTRAVENOUS
  Filled 2014-03-25: qty 5

## 2014-03-25 MED ORDER — INSULIN ASPART 100 UNIT/ML ~~LOC~~ SOLN
0.0000 [IU] | Freq: Three times a day (TID) | SUBCUTANEOUS | Status: DC
  Administered 2014-03-26: 2 [IU] via SUBCUTANEOUS
  Administered 2014-03-26: 3 [IU] via SUBCUTANEOUS

## 2014-03-25 MED ORDER — HYDROCODONE-ACETAMINOPHEN 7.5-325 MG PO TABS
1.0000 | ORAL_TABLET | Freq: Four times a day (QID) | ORAL | Status: DC
  Administered 2014-03-25 – 2014-03-26 (×5): 1 via ORAL
  Filled 2014-03-25 (×5): qty 1

## 2014-03-25 MED ORDER — PROPOFOL 10 MG/ML IV BOLUS
INTRAVENOUS | Status: DC | PRN
  Administered 2014-03-25 (×5): 20 mg via INTRAVENOUS
  Administered 2014-03-25: 30 mg via INTRAVENOUS

## 2014-03-25 MED ORDER — MAGNESIUM CITRATE PO SOLN: 1.0000 | Freq: Once | ORAL | Status: AC | PRN

## 2014-03-25 MED ORDER — 0.9 % SODIUM CHLORIDE (POUR BTL) OPTIME
TOPICAL | Status: DC | PRN
  Administered 2014-03-25: 1000 mL

## 2014-03-25 MED ORDER — FERROUS SULFATE 325 (65 FE) MG PO TABS
325.0000 mg | ORAL_TABLET | Freq: Three times a day (TID) | ORAL | Status: DC
  Administered 2014-03-26 (×2): 325 mg via ORAL
  Filled 2014-03-25 (×4): qty 1

## 2014-03-25 MED ORDER — EPHEDRINE SULFATE 50 MG/ML IJ SOLN
INTRAMUSCULAR | Status: AC
  Filled 2014-03-25: qty 1

## 2014-03-25 MED ORDER — ONDANSETRON HCL 4 MG PO TABS: 4.0000 mg | ORAL_TABLET | Freq: Four times a day (QID) | ORAL | Status: DC | PRN

## 2014-03-25 MED ORDER — ONDANSETRON HCL 4 MG/2ML IJ SOLN: 4.0000 mg | Freq: Four times a day (QID) | INTRAMUSCULAR | Status: DC | PRN

## 2014-03-25 MED ORDER — FENTANYL CITRATE 0.05 MG/ML IJ SOLN
INTRAMUSCULAR | Status: DC | PRN
  Administered 2014-03-25 (×2): 25 ug via INTRAVENOUS
  Administered 2014-03-25: 50 ug via INTRAVENOUS
  Administered 2014-03-25: 25 ug via INTRAVENOUS
  Administered 2014-03-25: 50 ug via INTRAVENOUS
  Administered 2014-03-25: 25 ug via INTRAVENOUS

## 2014-03-25 MED ORDER — ALUM & MAG HYDROXIDE-SIMETH 200-200-20 MG/5ML PO SUSP: 30.0000 mL | ORAL | Status: DC | PRN

## 2014-03-25 MED ORDER — CHLORHEXIDINE GLUCONATE 4 % EX LIQD: 60.0000 mL | Freq: Once | CUTANEOUS | Status: DC

## 2014-03-25 MED ORDER — MENTHOL 3 MG MT LOZG
1.0000 | LOZENGE | OROMUCOSAL | Status: DC | PRN
  Filled 2014-03-25: qty 9

## 2014-03-25 MED ORDER — POLYETHYLENE GLYCOL 3350 17 G PO PACK: 17.0000 g | PACK | Freq: Every day | ORAL | Status: DC | PRN

## 2014-03-25 MED ORDER — SODIUM CHLORIDE 0.9 % IJ SOLN
INTRAMUSCULAR | Status: AC
  Filled 2014-03-25: qty 20

## 2014-03-25 MED ORDER — BUPIVACAINE-EPINEPHRINE (PF) 0.25% -1:200000 IJ SOLN
INTRAMUSCULAR | Status: DC | PRN
  Administered 2014-03-25: 25 mL

## 2014-03-25 MED ORDER — INSULIN ASPART 100 UNIT/ML ~~LOC~~ SOLN
4.0000 [IU] | Freq: Three times a day (TID) | SUBCUTANEOUS | Status: DC
  Administered 2014-03-26 (×2): 4 [IU] via SUBCUTANEOUS

## 2014-03-25 MED ORDER — SIMVASTATIN 20 MG PO TABS
20.0000 mg | ORAL_TABLET | Freq: Every day | ORAL | Status: DC
  Administered 2014-03-25: 20 mg via ORAL
  Filled 2014-03-25 (×2): qty 1

## 2014-03-25 MED ORDER — CEFAZOLIN SODIUM-DEXTROSE 2-3 GM-% IV SOLR
2.0000 g | Freq: Four times a day (QID) | INTRAVENOUS | Status: AC
  Administered 2014-03-25 (×2): 2 g via INTRAVENOUS
  Filled 2014-03-25 (×2): qty 50

## 2014-03-25 MED ORDER — EPHEDRINE SULFATE 50 MG/ML IJ SOLN
INTRAMUSCULAR | Status: DC | PRN
  Administered 2014-03-25 (×2): 5 mg via INTRAVENOUS

## 2014-03-25 MED ORDER — ASPIRIN EC 325 MG PO TBEC
325.0000 mg | DELAYED_RELEASE_TABLET | Freq: Two times a day (BID) | ORAL | Status: DC
  Administered 2014-03-26: 325 mg via ORAL
  Filled 2014-03-25 (×3): qty 1

## 2014-03-25 MED ORDER — DEXAMETHASONE SODIUM PHOSPHATE 10 MG/ML IJ SOLN: 10.0000 mg | Freq: Once | INTRAMUSCULAR | Status: DC

## 2014-03-25 MED ORDER — INSULIN DETEMIR 100 UNIT/ML ~~LOC~~ SOLN
13.0000 [IU] | Freq: Every day | SUBCUTANEOUS | Status: DC
  Administered 2014-03-26: 13 [IU] via SUBCUTANEOUS
  Filled 2014-03-25: qty 0.13

## 2014-03-25 MED ORDER — SODIUM CHLORIDE 0.9 % IJ SOLN
INTRAMUSCULAR | Status: DC | PRN
  Administered 2014-03-25: 14 mL

## 2014-03-25 MED ORDER — PROMETHAZINE HCL 25 MG/ML IJ SOLN: 6.2500 mg | INTRAMUSCULAR | Status: DC | PRN

## 2014-03-25 MED ORDER — PROPOFOL 10 MG/ML IV BOLUS
INTRAVENOUS | Status: AC
  Filled 2014-03-25: qty 20

## 2014-03-25 MED ORDER — METHOCARBAMOL 500 MG PO TABS
500.0000 mg | ORAL_TABLET | Freq: Four times a day (QID) | ORAL | Status: DC | PRN
  Administered 2014-03-26 (×2): 500 mg via ORAL
  Filled 2014-03-25 (×2): qty 1

## 2014-03-25 MED ORDER — SODIUM CHLORIDE 0.9 % IR SOLN
Status: DC | PRN
  Administered 2014-03-25: 1000 mL

## 2014-03-25 MED ORDER — PHENOL 1.4 % MT LIQD
1.0000 | OROMUCOSAL | Status: DC | PRN
Start: 2014-03-25 — End: 2014-03-26

## 2014-03-25 MED ORDER — BISACODYL 5 MG PO TBEC: 5.0000 mg | DELAYED_RELEASE_TABLET | Freq: Every day | ORAL | Status: DC | PRN

## 2014-03-25 MED ORDER — ACETAMINOPHEN 650 MG RE SUPP: 650.0000 mg | Freq: Four times a day (QID) | RECTAL | Status: DC | PRN

## 2014-03-25 MED ORDER — DIPHENHYDRAMINE HCL 12.5 MG/5ML PO ELIX: 12.5000 mg | ORAL_SOLUTION | ORAL | Status: DC | PRN

## 2014-03-25 MED ORDER — DOCUSATE SODIUM 100 MG PO CAPS
100.0000 mg | ORAL_CAPSULE | Freq: Two times a day (BID) | ORAL | Status: DC
  Administered 2014-03-25 – 2014-03-26 (×2): 100 mg via ORAL

## 2014-03-25 MED ORDER — HYDROMORPHONE HCL PF 1 MG/ML IJ SOLN: 0.5000 mg | INTRAMUSCULAR | Status: DC | PRN

## 2014-03-25 MED ORDER — CEFAZOLIN SODIUM-DEXTROSE 2-3 GM-% IV SOLR
2.0000 g | INTRAVENOUS | Status: AC
  Administered 2014-03-25: 2 g via INTRAVENOUS

## 2014-03-25 MED ORDER — HYDROMORPHONE HCL PF 1 MG/ML IJ SOLN: 0.2500 mg | INTRAMUSCULAR | Status: DC | PRN

## 2014-03-25 MED ORDER — BUPIVACAINE IN DEXTROSE 0.75-8.25 % IT SOLN
INTRATHECAL | Status: DC | PRN
  Administered 2014-03-25: 1.8 mL via INTRATHECAL

## 2014-03-25 MED ORDER — METOCLOPRAMIDE HCL 5 MG/ML IJ SOLN: 5.0000 mg | Freq: Three times a day (TID) | INTRAMUSCULAR | Status: DC | PRN

## 2014-03-25 MED ORDER — LACTATED RINGERS IV SOLN
INTRAVENOUS | Status: DC | PRN
  Administered 2014-03-25 (×3): via INTRAVENOUS

## 2014-03-25 MED ORDER — PROPOFOL INFUSION 10 MG/ML OPTIME
INTRAVENOUS | Status: DC | PRN
  Administered 2014-03-25: 75 ug/kg/min via INTRAVENOUS

## 2014-03-25 MED ORDER — LINAGLIPTIN 5 MG PO TABS
5.0000 mg | ORAL_TABLET | Freq: Every day | ORAL | Status: DC
  Administered 2014-03-26: 5 mg via ORAL
  Filled 2014-03-25: qty 1

## 2014-03-25 MED ORDER — BUPIVACAINE-EPINEPHRINE (PF) 0.25% -1:200000 IJ SOLN
INTRAMUSCULAR | Status: AC
  Filled 2014-03-25: qty 30

## 2014-03-25 MED ORDER — ACETAMINOPHEN 325 MG PO TABS: 650.0000 mg | ORAL_TABLET | Freq: Four times a day (QID) | ORAL | Status: DC | PRN

## 2014-03-25 MED ORDER — INSULIN ASPART 100 UNIT/ML ~~LOC~~ SOLN: 0.0000 [IU] | Freq: Every day | SUBCUTANEOUS | Status: DC

## 2014-03-25 MED ORDER — DIPHENHYDRAMINE HCL 25 MG PO CAPS: 25.0000 mg | ORAL_CAPSULE | Freq: Four times a day (QID) | ORAL | Status: DC | PRN

## 2014-03-25 MED ORDER — KETOROLAC TROMETHAMINE 30 MG/ML IJ SOLN
INTRAMUSCULAR | Status: AC
  Filled 2014-03-25: qty 1

## 2014-03-25 SURGICAL SUPPLY — 55 items
ADH SKN CLS APL DERMABOND .7 (GAUZE/BANDAGES/DRESSINGS) ×1
BAG SPEC THK2 15X12 ZIP CLS (MISCELLANEOUS)
BAG ZIPLOCK 12X15 (MISCELLANEOUS) ×1 IMPLANT
BANDAGE ELASTIC 6 VELCRO ST LF (GAUZE/BANDAGES/DRESSINGS) ×2 IMPLANT
BANDAGE ESMARK 6X9 LF (GAUZE/BANDAGES/DRESSINGS) ×1 IMPLANT
BLADE SAW SGTL 13.0X1.19X90.0M (BLADE) ×2 IMPLANT
BNDG CMPR 9X6 STRL LF SNTH (GAUZE/BANDAGES/DRESSINGS) ×1
BNDG ESMARK 6X9 LF (GAUZE/BANDAGES/DRESSINGS) ×2
BONE CEMENT GENTAMICIN (Cement) ×4 IMPLANT
BOWL SMART MIX CTS (DISPOSABLE) ×2 IMPLANT
CAPT RP KNEE ×1 IMPLANT
CEMENT BONE GENTAMICIN 40 (Cement) IMPLANT
CUFF TOURN SGL QUICK 34 (TOURNIQUET CUFF) ×2
CUFF TRNQT CYL 34X4X40X1 (TOURNIQUET CUFF) ×1 IMPLANT
DERMABOND ADVANCED (GAUZE/BANDAGES/DRESSINGS) ×1
DERMABOND ADVANCED .7 DNX12 (GAUZE/BANDAGES/DRESSINGS) ×1 IMPLANT
DRAPE EXTREMITY T 121X128X90 (DRAPE) ×2 IMPLANT
DRAPE POUCH INSTRU U-SHP 10X18 (DRAPES) ×2 IMPLANT
DRAPE U-SHAPE 47X51 STRL (DRAPES) ×2 IMPLANT
DRSG AQUACEL AG ADV 3.5X10 (GAUZE/BANDAGES/DRESSINGS) ×2 IMPLANT
DURAPREP 26ML APPLICATOR (WOUND CARE) ×4 IMPLANT
ELECT REM PT RETURN 9FT ADLT (ELECTROSURGICAL) ×2
ELECTRODE REM PT RTRN 9FT ADLT (ELECTROSURGICAL) ×1 IMPLANT
FACESHIELD WRAPAROUND (MASK) ×8 IMPLANT
FACESHIELD WRAPAROUND OR TEAM (MASK) ×5 IMPLANT
GLOVE BIOGEL PI IND STRL 7.5 (GLOVE) ×1 IMPLANT
GLOVE BIOGEL PI IND STRL 8 (GLOVE) ×1 IMPLANT
GLOVE BIOGEL PI INDICATOR 7.5 (GLOVE) ×1
GLOVE BIOGEL PI INDICATOR 8 (GLOVE) ×1
GLOVE ECLIPSE 8.0 STRL XLNG CF (GLOVE) ×2 IMPLANT
GLOVE ORTHO TXT STRL SZ7.5 (GLOVE) ×4 IMPLANT
GOWN SPEC L3 XXLG W/TWL (GOWN DISPOSABLE) ×2 IMPLANT
GOWN STRL REUS W/TWL LRG LVL3 (GOWN DISPOSABLE) ×2 IMPLANT
GOWN STRL REUS W/TWL XL LVL3 (GOWN DISPOSABLE) ×2 IMPLANT
HANDPIECE INTERPULSE COAX TIP (DISPOSABLE) ×2
KIT BASIN OR (CUSTOM PROCEDURE TRAY) ×2 IMPLANT
MANIFOLD NEPTUNE II (INSTRUMENTS) ×2 IMPLANT
NDL SAFETY ECLIPSE 18X1.5 (NEEDLE) ×1 IMPLANT
NEEDLE HYPO 18GX1.5 SHARP (NEEDLE) ×2
NS IRRIG 1000ML POUR BTL (IV SOLUTION) ×2 IMPLANT
PACK TOTAL JOINT (CUSTOM PROCEDURE TRAY) ×2 IMPLANT
POSITIONER SURGICAL ARM (MISCELLANEOUS) ×2 IMPLANT
SET HNDPC FAN SPRY TIP SCT (DISPOSABLE) ×1 IMPLANT
SET PAD KNEE POSITIONER (MISCELLANEOUS) ×2 IMPLANT
SUCTION FRAZIER 12FR DISP (SUCTIONS) ×2 IMPLANT
SUT MNCRL AB 4-0 PS2 18 (SUTURE) ×2 IMPLANT
SUT VIC AB 1 CT1 36 (SUTURE) ×2 IMPLANT
SUT VIC AB 2-0 CT1 27 (SUTURE) ×6
SUT VIC AB 2-0 CT1 TAPERPNT 27 (SUTURE) ×3 IMPLANT
SUT VLOC 180 0 24IN GS25 (SUTURE) ×2 IMPLANT
SYR 50ML LL SCALE MARK (SYRINGE) ×2 IMPLANT
TOWEL OR 17X26 10 PK STRL BLUE (TOWEL DISPOSABLE) ×2 IMPLANT
TRAY FOLEY METER SIL LF 16FR (CATHETERS) ×1 IMPLANT
WATER STERILE IRR 1500ML POUR (IV SOLUTION) ×2 IMPLANT
WRAP KNEE MAXI GEL POST OP (GAUZE/BANDAGES/DRESSINGS) ×2 IMPLANT

## 2014-03-25 NOTE — Evaluation (Signed)
Physical Therapy Evaluation Patient Details Name: William [name] MRN: [id number] DOB: 1934-09-17 Today's Date: 03/25/2014   History of Present Illness  80 yo male s/p L TKA 4/28  Clinical Impression  Eval POD 0-pt required Min assist for mobility-able to ambulate ~60 feet with walker. Pt tolerated activity well. Recommend HHPT at discharge.     Follow Up Recommendations Home health PT    Equipment Recommendations  None recommended by PT    Recommendations for Other Services OT consult     Precautions / Restrictions Precautions Precautions: Knee;Fall Restrictions Weight Bearing Restrictions: No LLE Weight Bearing: Weight bearing as tolerated      Mobility  Bed Mobility Overal bed mobility: Needs Assistance Bed Mobility: Supine to Sit     Supine to sit: Min assist     General bed mobility comments: Assist for L LE. VCs safety, technique  Transfers Overall transfer level: Needs assistance Equipment used: Rolling walker (2 wheeled) Transfers: Sit to/from Stand Sit to Stand: Min assist         General transfer comment: VCs sfety, technique, hand placement. Assist to rise, stabilize, control descent  Ambulation/Gait Ambulation/Gait assistance: Min assist Ambulation Distance (Feet): 60 Feet Assistive device: Rolling walker (2 wheeled) Gait Pattern/deviations: Decreased stride length;Decreased step length - left;Antalgic;Step-to pattern;Trunk flexed     General Gait Details: VCs safety, technique, sequence. Assist to stabilize throughout.   Stairs            Wheelchair Mobility    Modified Rankin (Stroke Patients Only)       Balance                                             Pertinent Vitals/Pain 5/10 L knee with activity. Ice applied end of session    [place] expects to be discharged to:: Private residence Living Arrangements: Spouse/significant other Available Help at Discharge: Family Type of Home:  House Home Access: Stairs to enter   [employer] of Steps: 1+ threshold Home Layout: One level Home Equipment: Environmental consultant - 2 wheels;Cane - single point Additional Comments: borrowed shower chair. built in elevated toilet    Prior Function Level of Independence: Independent               Hand Dominance        Extremity/Trunk Assessment   Upper Extremity Assessment: Defer to OT evaluation           Lower Extremity Assessment: LLE deficits/detail   LLE Deficits / Details: hip felx 3/5, moves ankle well  Cervical / Trunk Assessment: Normal  Communication   Communication: No difficulties  Cognition Arousal/Alertness: Awake/alert Behavior During Therapy: WFL for tasks assessed/performed Overall Cognitive Status: Within Functional Limits for tasks assessed                      General Comments      Exercises        Assessment/Plan    PT Assessment Patient needs continued PT services  PT Diagnosis Difficulty walking;Abnormality of gait;Acute pain   PT Problem List Decreased strength;Decreased range of motion;Decreased activity tolerance;Decreased balance;Decreased mobility;Pain;Decreased knowledge of use of DME;Decreased knowledge of precautions  PT Treatment Interventions DME instruction;Gait training;Stair training;Functional mobility training;Therapeutic activities;Therapeutic exercise;Patient/family education;Balance training   PT Goals (Current goals can be found in the Care Plan section) Acute Rehab PT Goals Patient Stated  Goal: regain independence PT Goal Formulation: With patient/family Time For Goal Achievement: 04/01/14 Potential to Achieve Goals: Good    Frequency 7X/week   Barriers to discharge        Co-evaluation               End of Session Equipment Utilized During Treatment: Gait belt Activity Tolerance: Patient tolerated treatment well Patient left: in chair;with call bell/phone within reach;with family/visitor  present           Time: 5686-1683 PT Time Calculation (min): 19 min   Charges:   PT Evaluation $Initial PT Evaluation Tier I: 1 Procedure PT Treatments $Gait Training: 8-22 mins   PT G Codes:          Weston Anna, MPT Pager: [phone]

## 2014-03-25 NOTE — Progress Notes (Signed)
Utilization review completed.  

## 2014-03-25 NOTE — Anesthesia Postprocedure Evaluation (Signed)
  Anesthesia Post-op Note  Patient: William [name]  Procedure(s) Performed: Procedure(s) (LRB): LEFT TOTAL KNEE ARTHROPLASTY (Left)  Patient Location: PACU  Anesthesia Type: Spinal  Level of Consciousness: awake and alert   Airway and Oxygen Therapy: Patient Spontanous Breathing  Post-op Pain: mild  Post-op Assessment: Post-op Vital signs reviewed, Patient's Cardiovascular Status Stable, Respiratory Function Stable, Patent Airway and No signs of Nausea or vomiting  Last Vitals:  Filed Vitals:   03/25/14 1327  BP: 156/76  Pulse: 58  Temp: 36.4 C  Resp:     Post-op Vital Signs: stable   Complications: No apparent anesthesia complications

## 2014-03-25 NOTE — Care Management Note (Signed)
    Page 1 of 1   03/25/2014     4:13:54 PM CARE MANAGEMENT NOTE 03/25/2014  Patient:  RICHMOND, COLDREN   Account Number:  [id number]  Date Initiated:  03/25/2014  Documentation initiated by:  [facility]  Subjective/Objective Assessment:   adm: L knee osteoarthritis; L total knee replacement     Action/Plan:   discharge planning   Anticipated DC Date:  03/26/2014   Anticipated DC Plan:  [place]  CM consult      [facility] Choice  HOME HEALTH   Choice offered to / List presented to:  C-1 Patient        [place] arranged  HH-2 PT      [facility]   Status of service:  Completed, signed off Medicare Important Message given?   (If response is "NO", the following Medicare IM given date fields will be blank) Date Medicare IM given:   Date Additional Medicare IM given:    Discharge Disposition:  [place]  Per UR Regulation:    If discussed at Long Length of Stay Meetings, dates discussed:    Comments:  03/25/14 16:05 CM spoke with pt and pt's spouse, Mardene Celeste, in room to offer choice for home health services.  Arville Go will render HHPT to patient.  Address and contact numbers were verified with patient.  Pt and Mardene Celeste state they do not need any equipment as they have a rolling walker, a raised toilet set, shower seat and safety bars throughout their bathroom.  Ruthe Mannan on unit and aware of referral.  No other CM needs were communicated.  Mariane Masters, BSN, CM [phone].

## 2014-03-25 NOTE — Anesthesia Preprocedure Evaluation (Signed)
Anesthesia Evaluation  Patient identified by MRN, date of birth, ID band Patient awake    Reviewed: Allergy & Precautions, H&P , NPO status , Patient's Chart, lab work & pertinent test results  Airway Mallampati: II TM Distance: >3 FB Neck ROM: Full    Dental no notable dental hx.    Pulmonary sleep apnea , former smoker,  breath sounds clear to auscultation  Pulmonary exam normal       Cardiovascular hypertension, Pt. on medications Rhythm:Regular Rate:Normal     Neuro/Psych negative neurological ROS  negative psych ROS   GI/Hepatic negative GI ROS, Neg liver ROS,   Endo/Other  diabetes, Oral Hypoglycemic Agents  Renal/GU negative Renal ROS  negative genitourinary   Musculoskeletal negative musculoskeletal ROS (+)   Abdominal   Peds negative pediatric ROS (+)  Hematology negative hematology ROS (+)   Anesthesia Other Findings   Reproductive/Obstetrics negative OB ROS                           Anesthesia Physical  Anesthesia Plan  ASA: III  Anesthesia Plan: Spinal   Post-op Pain Management:    Induction: Intravenous  Airway Management Planned: Simple Face Mask  Additional Equipment:   Intra-op Plan:   Post-operative Plan:   Informed Consent: I have reviewed the patients History and Physical, chart, labs and discussed the procedure including the risks, benefits and alternatives for the proposed anesthesia with the patient or authorized representative who has indicated his/her understanding and acceptance.   Dental advisory given  Plan Discussed with: CRNA and Surgeon  Anesthesia Plan Comments:         Anesthesia Quick Evaluation  

## 2014-03-25 NOTE — Interval H&P Note (Signed)
History and Physical Interval Note:  03/25/2014 8:46 AM  William [name]  has presented today for surgery, with the diagnosis of osteoarthritis left knee  The various methods of treatment have been discussed with the patient and family. After consideration of risks, benefits and other options for treatment, the patient has consented to  Procedure(s): LEFT TOTAL KNEE ARTHROPLASTY (Left) as a surgical intervention .  The patient's history has been reviewed, patient examined, no change in status, stable for surgery.  I have reviewed the patient's chart and labs.  Questions were answered to the patient's satisfaction.     [name]

## 2014-03-25 NOTE — Op Note (Signed)
NAME:  William [name]                      MEDICAL RECORD NO.:  [id number]                             FACILITY:  [facility]      PHYSICIAN:  Pietro Cassis. Alvan Dame, M.D.  DATE OF BIRTH:  22-Jun-1934      DATE OF PROCEDURE:  03/25/2014                                     OPERATIVE REPORT         PREOPERATIVE DIAGNOSIS:  Left knee osteoarthritis.      POSTOPERATIVE DIAGNOSIS:  Left knee osteoarthritis.      FINDINGS:  The patient was noted to have complete loss of cartilage and   bone-on-bone arthritis with associated osteophytes in all three compartments of   the knee with a significant synovitis and associated effusion.      PROCEDURE:  Left total knee replacement.      COMPONENTS USED:  DePuy rotating platform posterior stabilized knee   system, a size 5 femur, 5 tibia, 12.5 mm insert, and 41 patellar   button.      SURGEON:  Pietro Cassis. Alvan Dame, M.D.      ASSISTANT:  Nehemiah Massed, PA-C.      ANESTHESIA:  Spinal.      SPECIMENS:  None.      COMPLICATION:  None.      DRAINS:  None  EBL: <100cc     TOURNIQUET TIME:   Total Tourniquet Time Documented: Thigh (Left) - 41 minutes Total: Thigh (Left) - 41 minutes  .      The patient was stable to the recovery room.      INDICATION FOR PROCEDURE:  William [name] is a 80 y.o. male patient of   mine.  The patient had been seen, evaluated, and treated conservatively in the   office with medication, activity modification, and injections.  The patient had   radiographic changes of bone-on-bone arthritis with endplate sclerosis and osteophytes noted.      The patient failed conservative measures including medication, injections, and activity modification, and at this point was ready for more definitive measures.   Based on the radiographic changes and failed conservative measures, the patient   decided to proceed with total knee replacement.  Risks of infection,   DVT, component failure, need for revision surgery, postop course, and   expectations were all   discussed and reviewed.  Consent was obtained for benefit of pain   relief.      PROCEDURE IN DETAIL:  The patient was brought to the operative theater.   Once adequate anesthesia, preoperative antibiotics, 2gm of Ancef administered, the patient was positioned supine with the left thigh tourniquet placed.  The  left lower extremity was prepped and draped in sterile fashion.  A time-   out was performed identifying the patient, planned procedure, and   extremity.      The left lower extremity was placed in the [facility] leg holder.  The leg was   exsanguinated, tourniquet elevated to 250 mmHg.  A midline incision was   made followed by median parapatellar arthrotomy.  Following initial   exposure, attention was first directed to the patella.  Precut  measurement was noted to be 27 mm.  I resected down to 15-16 mm and used a   41 patellar button to restore patellar height as well as cover the cut   surface.      The lug holes were drilled and a metal shim was placed to protect the   patella from retractors and saw blades.      At this point, attention was now directed to the femur.  The femoral   canal was opened with a drill, irrigated to try to prevent fat emboli.  An   intramedullary rod was passed at 5 degrees valgus, 10 mm of bone was   resected off the distal femur.  Following this resection, the tibia was   subluxated anteriorly.  Using the extramedullary guide, 10 mm of bone was resected off   the proximal lateral tibia.  We confirmed the gap would be   stable medially and laterally with a 10 mm insert as well as confirmed   the cut was perpendicular in the coronal plane, checking with an alignment rod.      Once this was done, I sized the femur to be a size 5 in the anterior-   posterior dimension, chose a standard component based on medial and   lateral dimension.  The size 5 rotation block was then pinned in   position anterior referenced using the  C-clamp to set rotation.  The   anterior, posterior, and  chamfer cuts were made without difficulty nor   notching making certain that I was along the anterior cortex to help   with flexion gap stability.      The final box cut was made off the lateral aspect of distal femur.      At this point, the tibia was sized to be a size 5, the size 5 tray was   then pinned in position through the medial third of the tubercle,   drilled, and keel punched.  Trial reduction was now carried with a 5 femur,  5 tibia, a 12.5 mm insert, and the 41 patella botton.  The knee was brought to   extension, full extension with good flexion stability with the patella   tracking through the trochlea without application of pressure.  Given   all these findings, the trial components removed.  Final components were   opened and cement was mixed.  The knee was irrigated with normal saline   solution and pulse lavage.  The synovial lining was   then injected with 20 cc of Exparel, 30cc of 0.25% Marcaine with epinephrine and 1 cc of Toradol,   total of 61 cc.      The knee was irrigated.  Final implants were then cemented onto clean and   dried cut surfaces of bone with the knee brought to extension with a 12.5 mm trial insert.      Once the cement had fully cured, the excess cement was removed   throughout the knee.  I confirmed I was satisfied with the range of   motion and stability, and the final 12.5 mm PS insert was chosen.  It was   placed into the knee.      The tourniquet had been let down at 41 minutes.  No significant   hemostasis required.  The   extensor mechanism was then reapproximated using #1 Vicryl and #0 V-lock sutures with the knee   in flexion.  The   remaining wound was closed with 2-0  Vicryl and running 4-0 Monocryl.   The knee was cleaned, dried, dressed sterilely using Dermabond and   Aquacel dressing.  The patient was then   brought to recovery room in stable condition, tolerating the  procedure   well.   Please note that Physician Assistant, Nehemiah Massed, PA-C, was present for the entirety of the case, and was utilized for pre-operative positioning, peri-operative retractor management, general facilitation of the procedure.  He was also utilized for primary wound closure at the end of the case.              Pietro Cassis Alvan Dame, M.D.    03/25/2014 12:08 PM

## 2014-03-25 NOTE — Anesthesia Procedure Notes (Signed)
Spinal  Patient location during procedure: OR Start time: 03/25/2014 10:09 AM End time: 03/25/2014 10:15 AM Staffing CRNA/Resident: Anne Fu Performed by: resident/CRNA  Preanesthetic Checklist Completed: patient identified, site marked, surgical consent, pre-op evaluation, timeout performed, IV checked, risks and benefits discussed and monitors and equipment checked Spinal Block Patient position: sitting Prep: Betadine Patient monitoring: heart rate, continuous pulse ox and blood pressure Location: L2-3 Injection technique: single-shot Needle Needle type: Spinocan  Needle gauge: 22 G Needle length: 9 cm Assessment Sensory level: T4 Additional Notes Expiration date of kit checked and confirmed. Patient tolerated procedure well, without complications. X 1 attempt with noted clear CSF return easy aspiration and administration of medication.  Noted lose of sensory and motor to bilat lower ext.

## 2014-03-25 NOTE — Transfer of Care (Signed)
Immediate Anesthesia Transfer of Care Note  Patient: William [name]  Procedure(s) Performed: Procedure(s): LEFT TOTAL KNEE ARTHROPLASTY (Left)  Patient Location: PACU  Anesthesia Type:Spinal  Level of Consciousness: sedated  Airway & Oxygen Therapy: Patient Spontanous Breathing and Patient connected to face mask oxygen  Post-op Assessment: Report given to PACU RN and Post -op Vital signs reviewed and stable  Post vital signs: Reviewed and stable  Complications: No apparent anesthesia complications

## 2014-03-25 NOTE — Discharge Instructions (Signed)
Weightbearing as tolerated. You may shower but the bandage should remain on.  Take aspirin 325mg  twice daily for prevention of blood clots.

## 2014-03-26 ENCOUNTER — Encounter (HOSPITAL_COMMUNITY): Payer: Self-pay | Admitting: Orthopedic Surgery

## 2014-03-26 LAB — GLUCOSE, CAPILLARY
Glucose-Capillary: 152 mg/dL — ABNORMAL HIGH (ref 70–99)
Glucose-Capillary: 144 mg/dL — ABNORMAL HIGH (ref 70–99)

## 2014-03-26 LAB — CBC
HCT: 40.8 % (ref 39.0–52.0)
HEMOGLOBIN: 13.9 g/dL (ref 13.0–17.0)
MCH: 31.8 pg (ref 26.0–34.0)
MCHC: 34.1 g/dL (ref 30.0–36.0)
MCV: 93.4 fL (ref 78.0–100.0)
Platelets: 191 10*3/uL (ref 150–400)
RBC: 4.37 MIL/uL (ref 4.22–5.81)
RDW: 13.2 % (ref 11.5–15.5)
WBC: 11.7 10*3/uL — ABNORMAL HIGH (ref 4.0–10.5)

## 2014-03-26 LAB — BASIC METABOLIC PANEL
BUN: 20 mg/dL (ref 6–23)
CALCIUM: 8.8 mg/dL (ref 8.4–10.5)
CO2: 24 mEq/L (ref 19–32)
Chloride: 103 mEq/L (ref 96–112)
Creatinine, Ser: 0.93 mg/dL (ref 0.50–1.35)
GFR calc Af Amer: >90 mL/min (ref >90)
GFR calc non Af Amer: 79 mL/min — ABNORMAL LOW (ref >90)
GLUCOSE: 155 mg/dL — AB (ref 70–99)
Potassium: 4.7 mEq/L (ref 3.7–5.3)
SODIUM: 138 meq/L (ref 137–147)

## 2014-03-26 LAB — HEMOGLOBIN A1C
Hgb A1c MFr Bld: 7.1 % — ABNORMAL HIGH (ref <5.7)
Mean Plasma Glucose: 157 mg/dL — ABNORMAL HIGH (ref <117)

## 2014-03-26 MED ORDER — METHOCARBAMOL 500 MG PO TABS: 500.0000 mg | ORAL_TABLET | Freq: Four times a day (QID) | ORAL | Status: DC | PRN

## 2014-03-26 MED ORDER — DSS 100 MG PO CAPS: 100.0000 mg | ORAL_CAPSULE | Freq: Two times a day (BID) | ORAL | Status: DC

## 2014-03-26 MED ORDER — HYDROCODONE-ACETAMINOPHEN 7.5-325 MG PO TABS: 1.0000 | ORAL_TABLET | Freq: Four times a day (QID) | ORAL | Status: DC

## 2014-03-26 MED ORDER — POLYETHYLENE GLYCOL 3350 17 G PO PACK: 17.0000 g | PACK | Freq: Every day | ORAL | Status: DC | PRN

## 2014-03-26 NOTE — Plan of Care (Signed)
Problem: Phase III Progression Outcomes Goal: Anticoagulant follow-up in place Outcome: Not Applicable Date Met:  21/11/55 asa

## 2014-03-26 NOTE — Evaluation (Signed)
Occupational Therapy Evaluation Patient Details Name: William [name] MRN: [id number] DOB: May 10, 1934 Today's Date: 03/26/2014    History of Present Illness 80 yo male s/p L TKA 4/28   Clinical Impression    Pt anticipates going home today.  All education completed.  Wife, who was a nurse will be supervising and assisting pt as needed.  No further OT.  Follow Up Recommendations  No OT follow up    Equipment Recommendations  None recommended by OT    Recommendations for Other Services       Precautions / Restrictions Precautions Precautions: Knee;Fall Restrictions Weight Bearing Restrictions: No LLE Weight Bearing: Weight bearing as tolerated      Mobility Bed Mobility Overal bed mobility:  (pt up in chair)                Transfers Overall transfer level: Needs assistance Equipment used: Rolling walker (2 wheeled) Transfers: Sit to/from Stand Sit to Stand: Supervision         General transfer comment: VCs sfety, technique, hand placement. Assist to rise, stabilize, control descent    Balance                                            ADL Overall ADL's : Needs assistance/impaired Eating/Feeding: Independent;Sitting   Grooming: Oral care;Supervision/safety;Wash/dry hands   Upper Body Bathing: Set up;Sitting   Lower Body Bathing: Minimal assistance;Sit to/from stand   Upper Body Dressing : Set up;Sitting   Lower Body Dressing: Minimal assistance;Sit to/from stand   Toilet Transfer: Supervision/safety;Ambulation           Functional mobility during ADLs: Supervision/safety;Rolling walker General ADL Comments: Instructed in use of reacher for LB dressing.  Recommended long sponge for washing L foot.  Wife will assist with socks and shoes.  Educated in safe footwear.  Verbally instructed in shower transfer.  Wife will supervise shower transfer.     Vision                     Perception     Praxis      Pertinent  Vitals/Pain VSS,L knee pain 3-4/10, premedicated, iced     Hand Dominance Right   Extremity/Trunk Assessment Upper Extremity Assessment Upper Extremity Assessment: Overall WFL for tasks assessed   Lower Extremity Assessment Lower Extremity Assessment: Defer to PT evaluation   Cervical / Trunk Assessment Cervical / Trunk Assessment: Normal   Communication Communication Communication: No difficulties   Cognition Arousal/Alertness: Awake/alert Behavior During Therapy: WFL for tasks assessed/performed Overall Cognitive Status: Within Functional Limits for tasks assessed                     General Comments       Exercises       Shoulder Instructions      Home Living Family/patient expects to be discharged to:: Private residence Living Arrangements: Spouse/significant other Available Help at Discharge: Family Type of Home: House Home Access: Stairs to enter Technical brewer of Steps: 1+ threshold   Home Layout: One level     Bathroom Shower/Tub: Occupational psychologist: Handicapped height     Home Equipment: Environmental consultant - 2 wheels;Cane - single point;Adaptive equipment;Grab bars - toilet;Grab bars - tub/shower Adaptive Equipment: Reacher Additional Comments: has shower seat he can borrow      Prior Functioning/Environment Level of  Independence: Independent             OT Diagnosis:     OT Problem List:     OT Treatment/Interventions:      OT Goals(Current goals can be found in the care plan section) Acute Rehab OT Goals Patient Stated Goal: regain independence  OT Frequency:     Barriers to D/C:            Co-evaluation              End of Session    Activity Tolerance: Patient tolerated treatment well Patient left: in chair;with call bell/phone within reach;with family/visitor present   Time: 1829-9371 OT Time Calculation (min): 16 min Charges:  OT General Charges $OT Visit: 1 Procedure OT Evaluation $Initial  OT Evaluation Tier I: 1 Procedure G-Codes:    William [name] 03-27-14, 9:27 AM [phone]

## 2014-03-26 NOTE — Progress Notes (Signed)
Physical Therapy Treatment Patient Details Name: William [name] MRN: [id number] DOB: [birth year] Today's Date: 03/26/2014    History of Present Illness 80 yo male s/p L TKA 4/28    PT Comments    2nd session-practiced ambulation and stair negotiation. All education completed. Ready to d/c home from PT standpoint.   Follow Up Recommendations  Home health PT     Equipment Recommendations  None recommended by PT    Recommendations for Other Services OT consult     Precautions / Restrictions Precautions Precautions: Knee;Fall Restrictions Weight Bearing Restrictions: No LLE Weight Bearing: Weight bearing as tolerated    Mobility  Bed Mobility               General bed mobility comments: pt in recliner  Transfers Overall transfer level: Needs assistance Equipment used: Rolling walker (2 wheeled) Transfers: Sit to/from Stand Sit to Stand: Min guard         General transfer comment: VCs sfety, technique, hand placement. close guard for safety  Ambulation/Gait Ambulation/Gait assistance: Min guard Ambulation Distance (Feet): 100 Feet Assistive device: Rolling walker (2 wheeled) Gait Pattern/deviations: Step-to pattern;Trunk flexed;Antalgic;Decreased stride length     General Gait Details: VCs safety, technique, sequence. close guard for safety   Stairs Stairs: Yes Stairs assistance: Min guard Stair Management: Forwards;With walker Number of Stairs: 1 General stair comments: VCs safety, technique, sequence. close guard for safety.Wife observed as well  Wheelchair Mobility    Modified Rankin (Stroke Patients Only)       Balance                                    Cognition Arousal/Alertness: Awake/alert Behavior During Therapy: WFL for tasks assessed/performed Overall Cognitive Status: Within Functional Limits for tasks assessed                      Exercises Total Joint Exercises Ankle Circles/Pumps: AROM;Both;10  reps;Seated Quad Sets: AROM;Both;10 reps;Seated Hip ABduction/ADduction: AROM;Left;10 reps;Seated Straight Leg Raises: AROM;Left;10 reps;Seated Knee Flexion: AAROM;Left;5 reps;Seated Goniometric ROM: 10-90 degrees    General Comments        Pertinent Vitals/Pain 4/10L knee    Home Living                      Prior Function            PT Goals (current goals can now be found in the care plan section) Progress towards PT goals: Progressing toward goals    Frequency  7X/week    PT Plan Current plan remains appropriate    Co-evaluation             End of Session Equipment Utilized During Treatment: Gait belt Activity Tolerance: Patient tolerated treatment well Patient left: in chair;with call bell/phone within reach;with family/visitor present     Time: 2836-6294 PT Time Calculation (min): 14 min  Charges:  $Gait Training: 8-22 mins $Therapeutic Exercise: 8-22 mins                    G Codes:      William [name], MPT Pager: [phone]

## 2014-03-26 NOTE — Progress Notes (Signed)
Physical Therapy Treatment Patient Details Name: William [name] MRN: [id number] DOB: [birth year] Today's Date: 03/26/2014    History of Present Illness 80 yo male s/p L TKA 4/28    PT Comments    Progressing with mobility.   Follow Up Recommendations  Home health PT     Equipment Recommendations  None recommended by PT    Recommendations for Other Services OT consult     Precautions / Restrictions Precautions Precautions: Knee;Fall Restrictions Weight Bearing Restrictions: No LLE Weight Bearing: Weight bearing as tolerated    Mobility  Bed Mobility Overal bed mobility:  (pt up in chair)             General bed mobility comments: pt in recliner  Transfers Overall transfer level: Needs assistance Equipment used: Rolling walker (2 wheeled) Transfers: Sit to/from Stand Sit to Stand: Min guard         General transfer comment: VCs sfety, technique, hand placement. close guard for safety  Ambulation/Gait Ambulation/Gait assistance: Min guard Ambulation Distance (Feet): 150 Feet Assistive device: Rolling walker (2 wheeled) Gait Pattern/deviations: Step-to pattern;Trunk flexed;Decreased stride length;Antalgic     General Gait Details: VCs safety, technique, sequence. close guard for safety   Stairs            Wheelchair Mobility    Modified Rankin (Stroke Patients Only)       Balance                                    Cognition Arousal/Alertness: Awake/alert Behavior During Therapy: WFL for tasks assessed/performed Overall Cognitive Status: Within Functional Limits for tasks assessed                      Exercises Total Joint Exercises Ankle Circles/Pumps: AROM;Both;10 reps;Seated Quad Sets: AROM;Both;10 reps;Seated Hip ABduction/ADduction: AROM;Left;10 reps;Seated Straight Leg Raises: AROM;Left;10 reps;Seated Knee Flexion: AAROM;Left;5 reps;Seated Goniometric ROM: 10-90 degrees    General Comments         Pertinent Vitals/Pain 5/10 L knee. Ice applied end of session    [place] expects to be discharged to:: Private residence Living Arrangements: Spouse/significant other Available Help at Discharge: Family Type of Home: House Home Access: Stairs to enter   Home Layout: [place]: Environmental consultant - 2 wheels;Cane - single point;Adaptive equipment;Grab bars - toilet;Grab bars - tub/shower Additional Comments: has shower seat he can borrow    Prior Function Level of Independence: Independent          PT Goals (current goals can now be found in the care plan section) Acute Rehab PT Goals Patient Stated Goal: regain independence Progress towards PT goals: Progressing toward goals    Frequency  7X/week    PT Plan Current plan remains appropriate    Co-evaluation             End of Session Equipment Utilized During Treatment: Gait belt Activity Tolerance: Patient tolerated treatment well Patient left: in chair;with call bell/phone within reach;with family/visitor present     Time: 8315-1761 PT Time Calculation (min): 23 min  Charges:  $Gait Training: 8-22 mins $Therapeutic Exercise: 8-22 mins                    G Codes:      Weston Anna, MPT Pager: [phone]

## 2014-03-26 NOTE — Progress Notes (Signed)
Discharged from floor via w/c, wife with pt. No changes in assessment. Yukari Flax, RN  

## 2014-03-26 NOTE — Progress Notes (Signed)
   Subjective: 1 Day Post-Op Procedure(s) (LRB): LEFT TOTAL KNEE ARTHROPLASTY (Left) Patient reports pain as mild.   Patient seen in rounds with Dr. Alvan Dame. Patient is well, and has had no acute complaints or problems. No issues overnight. No SOB or chest pain.  Plan is to go Home after hospital stay.  Objective: Vital signs in last 24 hours: Temp:  [97.4 F (36.3 C)-98.3 F (36.8 C)] 97.8 F (36.6 C) (04/29 0541) Pulse Rate:  [58-95] 95 (04/29 0541) Resp:  [13-20] 16 (04/29 0541) BP: (121-169)/(65-89) 167/80 mmHg (04/29 0541) SpO2:  [96 %-100 %] 98 % (04/29 0541) Weight:  [109.77 kg (242 lb)] 109.77 kg (242 lb) (04/28 1327)  Intake/Output from previous day:  Intake/Output Summary (Last 24 hours) at 03/26/14 0736 Last data filed at 03/26/14 0542  Gross per 24 hour  Intake 3796.67 ml  Output   1065 ml  Net 2731.67 ml     Labs:  Recent Labs  03/26/14 0430  HGB 13.9    Recent Labs  03/26/14 0430  WBC 11.7*  RBC 4.37  HCT 40.8  PLT 191    Recent Labs  03/26/14 0430  NA 138  K 4.7  CL 103  CO2 24  BUN 20  CREATININE 0.93  GLUCOSE 155*  CALCIUM 8.8    EXAM General - Patient is Alert and Oriented Extremity - Neurologically intact Dorsiflexion/Plantar flexion intact Dressing/Incision - clean, dry, no drainage Motor Function - intact, moving foot and toes well on exam.   Past Medical History  Diagnosis Date  . Hypertension   . Diabetes mellitus without complication   . Hypercholesteremia   . Arthritis   . Prostate cancer     with radiation treatment  . Sleep apnea 2/14    "mild per patient" hasnt gotten CPAP machine    Assessment/Plan: 1 Day Post-Op Procedure(s) (LRB): LEFT TOTAL KNEE ARTHROPLASTY (Left) Active Problems:   DJD (degenerative joint disease) of knee  Estimated body mass index is 31.93 kg/(m^2) as calculated from the following:   Height as of this encounter: 6\' 1"  (1.854 m).   Weight as of this encounter: 109.77 kg (242  lb). Advance diet Up with therapy D/C IV fluids Discharge home with home health Wednesday if progresses well with PT  DVT Prophylaxis - Aspirin Weight-Bearing as tolerated  Malachy Chamber 03/26/2014, 7:36 AM

## 2014-03-27 DIAGNOSIS — IMO0001 Reserved for inherently not codable concepts without codable children: Secondary | ICD-10-CM | POA: Diagnosis not present

## 2014-03-27 DIAGNOSIS — E119 Type 2 diabetes mellitus without complications: Secondary | ICD-10-CM | POA: Diagnosis not present

## 2014-03-27 DIAGNOSIS — Z96659 Presence of unspecified artificial knee joint: Secondary | ICD-10-CM | POA: Diagnosis not present

## 2014-03-27 DIAGNOSIS — I10 Essential (primary) hypertension: Secondary | ICD-10-CM | POA: Diagnosis not present

## 2014-03-27 DIAGNOSIS — Z471 Aftercare following joint replacement surgery: Secondary | ICD-10-CM | POA: Diagnosis not present

## 2014-03-31 DIAGNOSIS — Z471 Aftercare following joint replacement surgery: Secondary | ICD-10-CM | POA: Diagnosis not present

## 2014-03-31 DIAGNOSIS — IMO0001 Reserved for inherently not codable concepts without codable children: Secondary | ICD-10-CM | POA: Diagnosis not present

## 2014-03-31 DIAGNOSIS — Z96659 Presence of unspecified artificial knee joint: Secondary | ICD-10-CM | POA: Diagnosis not present

## 2014-03-31 DIAGNOSIS — E119 Type 2 diabetes mellitus without complications: Secondary | ICD-10-CM | POA: Diagnosis not present

## 2014-03-31 DIAGNOSIS — I10 Essential (primary) hypertension: Secondary | ICD-10-CM | POA: Diagnosis not present

## 2014-04-01 DIAGNOSIS — IMO0001 Reserved for inherently not codable concepts without codable children: Secondary | ICD-10-CM | POA: Diagnosis not present

## 2014-04-01 DIAGNOSIS — Z471 Aftercare following joint replacement surgery: Secondary | ICD-10-CM | POA: Diagnosis not present

## 2014-04-01 DIAGNOSIS — I10 Essential (primary) hypertension: Secondary | ICD-10-CM | POA: Diagnosis not present

## 2014-04-01 DIAGNOSIS — Z96659 Presence of unspecified artificial knee joint: Secondary | ICD-10-CM | POA: Diagnosis not present

## 2014-04-01 DIAGNOSIS — E119 Type 2 diabetes mellitus without complications: Secondary | ICD-10-CM | POA: Diagnosis not present

## 2014-04-02 DIAGNOSIS — Z471 Aftercare following joint replacement surgery: Secondary | ICD-10-CM | POA: Diagnosis not present

## 2014-04-02 DIAGNOSIS — I10 Essential (primary) hypertension: Secondary | ICD-10-CM | POA: Diagnosis not present

## 2014-04-02 DIAGNOSIS — E119 Type 2 diabetes mellitus without complications: Secondary | ICD-10-CM | POA: Diagnosis not present

## 2014-04-02 DIAGNOSIS — Z96659 Presence of unspecified artificial knee joint: Secondary | ICD-10-CM | POA: Diagnosis not present

## 2014-04-02 DIAGNOSIS — IMO0001 Reserved for inherently not codable concepts without codable children: Secondary | ICD-10-CM | POA: Diagnosis not present

## 2014-04-03 DIAGNOSIS — Z471 Aftercare following joint replacement surgery: Secondary | ICD-10-CM | POA: Diagnosis not present

## 2014-04-03 DIAGNOSIS — IMO0001 Reserved for inherently not codable concepts without codable children: Secondary | ICD-10-CM | POA: Diagnosis not present

## 2014-04-03 DIAGNOSIS — Z96659 Presence of unspecified artificial knee joint: Secondary | ICD-10-CM | POA: Diagnosis not present

## 2014-04-03 DIAGNOSIS — I10 Essential (primary) hypertension: Secondary | ICD-10-CM | POA: Diagnosis not present

## 2014-04-03 DIAGNOSIS — E119 Type 2 diabetes mellitus without complications: Secondary | ICD-10-CM | POA: Diagnosis not present

## 2014-04-04 DIAGNOSIS — IMO0001 Reserved for inherently not codable concepts without codable children: Secondary | ICD-10-CM | POA: Diagnosis not present

## 2014-04-04 DIAGNOSIS — Z471 Aftercare following joint replacement surgery: Secondary | ICD-10-CM | POA: Diagnosis not present

## 2014-04-04 DIAGNOSIS — Z96659 Presence of unspecified artificial knee joint: Secondary | ICD-10-CM | POA: Diagnosis not present

## 2014-04-04 DIAGNOSIS — E119 Type 2 diabetes mellitus without complications: Secondary | ICD-10-CM | POA: Diagnosis not present

## 2014-04-04 DIAGNOSIS — I10 Essential (primary) hypertension: Secondary | ICD-10-CM | POA: Diagnosis not present

## 2014-04-07 DIAGNOSIS — I10 Essential (primary) hypertension: Secondary | ICD-10-CM | POA: Diagnosis not present

## 2014-04-07 DIAGNOSIS — IMO0001 Reserved for inherently not codable concepts without codable children: Secondary | ICD-10-CM | POA: Diagnosis not present

## 2014-04-07 DIAGNOSIS — Z471 Aftercare following joint replacement surgery: Secondary | ICD-10-CM | POA: Diagnosis not present

## 2014-04-07 DIAGNOSIS — E119 Type 2 diabetes mellitus without complications: Secondary | ICD-10-CM | POA: Diagnosis not present

## 2014-04-07 DIAGNOSIS — Z96659 Presence of unspecified artificial knee joint: Secondary | ICD-10-CM | POA: Diagnosis not present

## 2014-04-07 NOTE — Discharge Summary (Signed)
Physician Discharge Summary  Patient ID: CHANDON LAZCANO MRN: [id number] DOB/AGE: February 09, 1934 80 y.o.  Admit date: 03/25/2014 Discharge date: 03/26/2014   Procedures:  Procedure(s) (LRB): LEFT TOTAL KNEE ARTHROPLASTY (Left)  Attending Physician:  Dr. Paralee Cancel   Admission Diagnoses:   Left knee OA / pain  Discharge Diagnoses:  Active Problems:   DJD (degenerative joint disease) of knee  Past Medical History  Diagnosis Date  . Hypertension   . Diabetes mellitus without complication   . Hypercholesteremia   . Arthritis   . Prostate cancer     with radiation treatment  . Sleep apnea 2/14    "mild per patient" hasnt gotten CPAP machine    HPI: William [name], 80 y.o. male, has a history of pain and functional disability in the left knee due to arthritis and has failed non-surgical conservative treatments for greater than 12 weeks to include NSAID's and/or analgesics, corticosteriod injections, use of assistive devices and activity modification. Onset of symptoms was gradual, starting >10 years ago with gradually worsening course since that time. The patient noted no past surgery on the left knee(s). Patient currently rates pain in the left knee(s) at 7 out of 10 with activity. Patient has worsening of pain with activity and weight bearing, pain that interferes with activities of daily living, pain with passive range of motion, crepitus and joint swelling. Patient has evidence of periarticular osteophytes and joint space narrowing by imaging studies. There is no active infection. Risks, benefits and expectations were discussed with the patient. Risks including but not limited to the risk of anesthesia, blood clots, nerve damage, blood vessel damage, failure of the prosthesis, infection and up to and including death. Patient understand the risks, benefits and expectations and wishes to proceed with surgery.  PCP: Tommy Medal, MD   Discharged Condition: good  Hospital Course:   Patient underwent the above stated procedure on 03/25/2014. Patient tolerated the procedure well and brought to the recovery room in good condition and subsequently to the floor.  POD #1 BP: 167/80 ; Pulse: 95 ; Temp: 97.8 F (36.6 C) ; Resp: 16  Patient reports pain as mild. Patient seen in rounds with Dr. Alvan Dame. Patient is well, and has had no acute complaints or problems. No issues overnight. No SOB or chest pain. Ready to be discharged home.  Neurovascular intact, dorsiflexion/plantar flexion intact, incision: dressing C/D/I, no cellulitis present and compartment soft.   LABS  Basename    HGB  13.9  HCT  40.8    Discharge Exam: General appearance: alert, cooperative and no distress Extremities: Homans sign is negative, no sign of DVT, no edema, redness or tenderness in the calves or thighs and no ulcers, gangrene or trophic changes  Disposition:     Home with follow up in 2 weeks   Follow-up Information   Follow up with [facility]. (home health physical therapy)    Contact information:   [address] [place] [address] [phone]       Follow up with [name], MD. Schedule an appointment as soon as possible for a visit in 2 weeks.   Specialty:  Orthopedic Surgery   Contact information:   [address] [place] [address] [phone]       Discharge Orders   Future Orders Complete By Expires   Call MD / Call 911  As directed    Constipation Prevention  As directed    Discharge instructions  As directed  Driving restrictions  As directed    Increase activity slowly as tolerated  As directed         Medication List    STOP taking these medications       aspirin EC 81 MG tablet      TAKE these medications       co-enzyme Q-10 30 MG capsule  Take 300 mg by mouth daily.     diclofenac 75 MG EC tablet  Commonly known as:  VOLTAREN  Take 75 mg by mouth 2 (two) times daily.     diclofenac sodium 1 % Gel    Commonly known as:  VOLTAREN  Apply 1 application topically 4 (four) times daily as needed (Applied to knees).     DSS 100 MG Caps  Take 100 mg by mouth 2 (two) times daily.     GLUCOSAMINE CHONDR 1500 COMPLX Caps  Take 1 capsule by mouth 2 (two) times daily.     HYDROcodone-acetaminophen 7.5-325 MG per tablet  Commonly known as:  NORCO  Take 1 tablet by mouth every 6 (six) hours.     LEVEMIR FLEXTOUCH 100 UNIT/ML Pen  Generic drug:  Insulin Detemir  Inject 13 Units into the skin daily before lunch. BEFORE LUNCH     lisinopril-hydrochlorothiazide 10-12.5 MG per tablet  Commonly known as:  PRINZIDE,ZESTORETIC  Take 1 tablet by mouth every morning.     metFORMIN 1000 MG tablet  Commonly known as:  GLUCOPHAGE  Take 1,000 mg by mouth 2 (two) times daily with a meal.     methocarbamol 500 MG tablet  Commonly known as:  ROBAXIN  Take 1 tablet (500 mg total) by mouth every 6 (six) hours as needed for muscle spasms.     polyethylene glycol packet  Commonly known as:  MIRALAX / GLYCOLAX  Take 17 g by mouth daily as needed for mild constipation.     pravastatin 40 MG tablet  Commonly known as:  PRAVACHOL  Take 40 mg by mouth daily with supper.     saxagliptin HCl 2.5 MG Tabs tablet  Commonly known as:  ONGLYZA  Take 5 mg by mouth daily.         Signed: West Pugh. Maritsa Hunsucker   PAC  04/07/2014, 10:01 AM

## 2014-04-08 DIAGNOSIS — Z471 Aftercare following joint replacement surgery: Secondary | ICD-10-CM | POA: Diagnosis not present

## 2014-04-08 DIAGNOSIS — E119 Type 2 diabetes mellitus without complications: Secondary | ICD-10-CM | POA: Diagnosis not present

## 2014-04-08 DIAGNOSIS — I10 Essential (primary) hypertension: Secondary | ICD-10-CM | POA: Diagnosis not present

## 2014-04-08 DIAGNOSIS — Z96659 Presence of unspecified artificial knee joint: Secondary | ICD-10-CM | POA: Diagnosis not present

## 2014-04-08 DIAGNOSIS — IMO0001 Reserved for inherently not codable concepts without codable children: Secondary | ICD-10-CM | POA: Diagnosis not present

## 2014-04-09 DIAGNOSIS — Z471 Aftercare following joint replacement surgery: Secondary | ICD-10-CM | POA: Diagnosis not present

## 2014-04-09 DIAGNOSIS — E119 Type 2 diabetes mellitus without complications: Secondary | ICD-10-CM | POA: Diagnosis not present

## 2014-04-09 DIAGNOSIS — IMO0001 Reserved for inherently not codable concepts without codable children: Secondary | ICD-10-CM | POA: Diagnosis not present

## 2014-04-09 DIAGNOSIS — I10 Essential (primary) hypertension: Secondary | ICD-10-CM | POA: Diagnosis not present

## 2014-04-09 DIAGNOSIS — Z96659 Presence of unspecified artificial knee joint: Secondary | ICD-10-CM | POA: Diagnosis not present

## 2014-04-10 DIAGNOSIS — IMO0001 Reserved for inherently not codable concepts without codable children: Secondary | ICD-10-CM | POA: Diagnosis not present

## 2014-04-10 DIAGNOSIS — I10 Essential (primary) hypertension: Secondary | ICD-10-CM | POA: Diagnosis not present

## 2014-04-10 DIAGNOSIS — Z96659 Presence of unspecified artificial knee joint: Secondary | ICD-10-CM | POA: Diagnosis not present

## 2014-04-10 DIAGNOSIS — E119 Type 2 diabetes mellitus without complications: Secondary | ICD-10-CM | POA: Diagnosis not present

## 2014-04-10 DIAGNOSIS — Z471 Aftercare following joint replacement surgery: Secondary | ICD-10-CM | POA: Diagnosis not present

## 2014-04-11 DIAGNOSIS — Z471 Aftercare following joint replacement surgery: Secondary | ICD-10-CM | POA: Diagnosis not present

## 2014-04-11 DIAGNOSIS — E119 Type 2 diabetes mellitus without complications: Secondary | ICD-10-CM | POA: Diagnosis not present

## 2014-04-11 DIAGNOSIS — Z96659 Presence of unspecified artificial knee joint: Secondary | ICD-10-CM | POA: Diagnosis not present

## 2014-04-11 DIAGNOSIS — IMO0001 Reserved for inherently not codable concepts without codable children: Secondary | ICD-10-CM | POA: Diagnosis not present

## 2014-04-11 DIAGNOSIS — I10 Essential (primary) hypertension: Secondary | ICD-10-CM | POA: Diagnosis not present

## 2014-04-14 DIAGNOSIS — M25569 Pain in unspecified knee: Secondary | ICD-10-CM | POA: Diagnosis not present

## 2014-04-17 NOTE — Progress Notes (Signed)
[place], [place]  - Please enter preop orders in epic for William [name] - surg date 6/9.  Thanks.

## 2014-04-23 DIAGNOSIS — M25569 Pain in unspecified knee: Secondary | ICD-10-CM | POA: Diagnosis not present

## 2014-04-24 ENCOUNTER — Encounter (HOSPITAL_COMMUNITY): Payer: Self-pay | Admitting: Pharmacy Technician

## 2014-04-24 DIAGNOSIS — I10 Essential (primary) hypertension: Secondary | ICD-10-CM | POA: Diagnosis not present

## 2014-04-24 DIAGNOSIS — Z1331 Encounter for screening for depression: Secondary | ICD-10-CM | POA: Diagnosis not present

## 2014-04-24 DIAGNOSIS — Z0000 Encounter for general adult medical examination without abnormal findings: Secondary | ICD-10-CM | POA: Diagnosis not present

## 2014-04-24 DIAGNOSIS — E119 Type 2 diabetes mellitus without complications: Secondary | ICD-10-CM | POA: Diagnosis not present

## 2014-04-28 NOTE — Patient Instructions (Addendum)
William [name]  04/28/2014   Your procedure is scheduled on:  05/06/2014  1130am-1240pm  Report to [facility].  Follow the Signs to [place] at  0830      am  Call this number if you have problems the morning of surgery: [phone]   Remember:   Do not eat food or drink liquids after midnight.   Take these medicines the morning of surgery with A SIP OF WATER:    Do not wear jewelry,   Do not wear lotions, powders, or perfumes.   . Men may shave face and neck.  Do not bring valuables to the hospital.  Contacts, dentures or bridgework may not be worn into surgery.  Leave suitcase in the car. After surgery it may be brought to your room.  For patients admitted to the hospital, checkout time is 11:00 AM the day of  discharge.       [place] - Preparing for Surgery Before surgery, you can play an important role.  Because skin is not sterile, your skin needs to be as free of germs as possible.  You can reduce the number of germs on your skin by washing with CHG (chlorahexidine gluconate) soap before surgery.  CHG is an antiseptic cleaner which kills germs and bonds with the skin to continue killing germs even after washing. Please DO NOT use if you have an allergy to CHG or antibacterial soaps.  If your skin becomes reddened/irritated stop using the CHG and inform your nurse when you arrive at Short Stay. Do not shave (including legs and underarms) for at least 48 hours prior to the first CHG shower.  You may shave your face/neck. Please follow these instructions carefully:  1.  Shower with CHG Soap the night before surgery and the  morning of Surgery.  2.  If you choose to wash your hair, wash your hair first as usual with your  normal  shampoo.  3.  After you shampoo, rinse your hair and body thoroughly to remove the  shampoo.                           4.  Use CHG as you would any other liquid soap.  You can apply chg directly  to the skin and wash    Gently with a scrungie or clean washcloth.  5.  Apply the CHG Soap to your body ONLY FROM THE NECK DOWN.   Do not use on face/ open                           Wound or open sores. Avoid contact with eyes, ears mouth and genitals (private parts).                       Wash face,  Genitals (private parts) with your normal soap.             6.  Wash thoroughly, paying special attention to the area where your surgery  will be performed.  7.  Thoroughly rinse your body with warm water from the neck down.  8.  DO NOT shower/wash with your normal soap after using and rinsing off  the CHG Soap.                9.  Pat yourself dry with a clean towel.  10.  Wear clean pajamas.            11.  Place clean sheets on your bed the night of your first shower and do not  sleep with pets. [name] : Do not apply any lotions/deodorants the morning of surgery.  Please wear clean clothes to the hospital/surgery center.  FAILURE TO FOLLOW THESE INSTRUCTIONS MAY RESULT IN THE CANCELLATION OF YOUR SURGERY PATIENT SIGNATURE_________________________________  NURSE SIGNATURE__________________________________  ________________________________________________________________________  WHAT IS A BLOOD TRANSFUSION? Blood Transfusion Information  A transfusion is the replacement of blood or some of its parts. Blood is made up of multiple cells which provide different functions.  Red blood cells carry oxygen and are used for blood loss replacement.  White blood cells fight against infection.  Platelets control bleeding.  Plasma helps clot blood.  Other blood products are available for specialized needs, such as hemophilia or other clotting disorders. BEFORE THE TRANSFUSION  Who gives blood for transfusions?   Healthy volunteers who are fully evaluated to make sure their blood is safe. This is blood bank blood. Transfusion therapy is the safest it has ever been in the practice of medicine. Before  blood is taken from a donor, a complete history is taken to make sure that person has no history of diseases nor engages in risky social behavior (examples are intravenous drug use or sexual activity with multiple partners). The donor's travel history is screened to minimize risk of transmitting infections, such as malaria. The donated blood is tested for signs of infectious diseases, such as HIV and hepatitis. The blood is then tested to be sure it is compatible with you in order to minimize the chance of a transfusion reaction. If you or a relative donates blood, this is often done in anticipation of surgery and is not appropriate for emergency situations. It takes many days to process the donated blood. RISKS AND COMPLICATIONS Although transfusion therapy is very safe and saves many lives, the main dangers of transfusion include:   Getting an infectious disease.  Developing a transfusion reaction. This is an allergic reaction to something in the blood you were given. Every precaution is taken to prevent this. The decision to have a blood transfusion has been considered carefully by your caregiver before blood is given. Blood is not given unless the benefits outweigh the risks. AFTER THE TRANSFUSION  Right after receiving a blood transfusion, you will usually feel much better and more energetic. This is especially true if your red blood cells have gotten low (anemic). The transfusion raises the level of the red blood cells which carry oxygen, and this usually causes an energy increase.  The nurse administering the transfusion will monitor you carefully for complications. HOME CARE INSTRUCTIONS  No special instructions are needed after a transfusion. You may find your energy is better. Speak with your caregiver about any limitations on activity for underlying diseases you may have. SEEK MEDICAL CARE IF:   Your condition is not improving after your transfusion.  You develop redness or irritation  at the intravenous (IV) site. SEEK IMMEDIATE MEDICAL CARE IF:  Any of the following symptoms occur over the next 12 hours:  Shaking chills.  You have a temperature by mouth above 102 F (38.9 C), not controlled by medicine.  Chest, back, or muscle pain.  People around you feel you are not acting correctly or are confused.  Shortness of breath or difficulty breathing.  Dizziness and fainting.  You get a rash or develop  hives.  You have a decrease in urine output.  Your urine turns a dark color or changes to pink, red, or brown. Any of the following symptoms occur over the next 10 days:  You have a temperature by mouth above 102 F (38.9 C), not controlled by medicine.  Shortness of breath.  Weakness after normal activity.  The white part of the eye turns yellow (jaundice).  You have a decrease in the amount of urine or are urinating less often.  Your urine turns a dark color or changes to pink, red, or brown. Document Released: 11/11/2000 Document Revised: 02/06/2012 Document Reviewed: 06/30/2008 ExitCare Patient Information 2014 [place].  _______________________________________________________________________  Incentive Spirometer  An incentive spirometer is a tool that can help keep your lungs clear and active. This tool measures how well you are filling your lungs with each breath. Taking long deep breaths may help reverse or decrease the chance of developing breathing (pulmonary) problems (especially infection) following:  A long period of time when you are unable to move or be active. BEFORE THE PROCEDURE   If the spirometer includes an indicator to show your best effort, your nurse or respiratory therapist will set it to a desired goal.  If possible, sit up straight or lean slightly forward. Try not to slouch.  Hold the incentive spirometer in an upright position. INSTRUCTIONS FOR USE  1. Sit on the edge of your bed if possible, or sit up as far as  you can in bed or on a chair. 2. Hold the incentive spirometer in an upright position. 3. Breathe out normally. 4. Place the mouthpiece in your mouth and seal your lips tightly around it. 5. Breathe in slowly and as deeply as possible, raising the piston or the ball toward the top of the column. 6. Hold your breath for 3-5 seconds or for as long as possible. Allow the piston or ball to fall to the bottom of the column. 7. Remove the mouthpiece from your mouth and breathe out normally. 8. Rest for a few seconds and repeat Steps 1 through 7 at least 10 times every 1-2 hours when you are awake. Take your time and take a few normal breaths between deep breaths. 9. The spirometer may include an indicator to show your best effort. Use the indicator as a goal to work toward during each repetition. 10. After each set of 10 deep breaths, practice coughing to be sure your lungs are clear. If you have an incision (the cut made at the time of surgery), support your incision when coughing by placing a pillow or rolled up towels firmly against it. Once you are able to get out of bed, walk around indoors and cough well. You may stop using the incentive spirometer when instructed by your caregiver.  RISKS AND COMPLICATIONS  Take your time so you do not get dizzy or light-headed.  If you are in pain, you may need to take or ask for pain medication before doing incentive spirometry. It is harder to take a deep breath if you are having pain. AFTER USE  Rest and breathe slowly and easily.  It can be helpful to keep track of a log of your progress. Your caregiver can provide you with a simple table to help with this. If you are using the spirometer at home, follow these instructions: [place] IF:   You are having difficultly using the spirometer.  You have trouble using the spirometer as often as instructed.  Your  pain medication is not giving enough relief while using the spirometer.  You develop  fever of 100.5 F (38.1 C) or higher. SEEK IMMEDIATE MEDICAL CARE IF:   You cough up bloody sputum that had not been present before.  You develop fever of 102 F (38.9 C) or greater.  You develop worsening pain at or near the incision site. MAKE SURE YOU:   Understand these instructions.  Will watch your condition.  Will get help right away if you are not doing well or get worse. Document Released: 03/27/2007 Document Revised: 02/06/2012 Document Reviewed: 05/28/2007 ExitCare Patient Information 2014 ExitCare, [place].   ________________________________________________________________________    Please read over the following fact sheets that you were given: MRSA Information, coughing and deep breathing exercises, leg exercises

## 2014-04-29 ENCOUNTER — Encounter (HOSPITAL_COMMUNITY)
Admission: RE | Admit: 2014-04-29 | Discharge: 2014-04-29 | Disposition: A | Discharge status: Home / Self Care | Payer: Medicare Other | Source: Ambulatory Visit | Attending: Orthopedic Surgery | Admitting: Orthopedic Surgery

## 2014-04-29 ENCOUNTER — Encounter (HOSPITAL_COMMUNITY): Payer: Self-pay

## 2014-04-29 DIAGNOSIS — Z01812 Encounter for preprocedural laboratory examination: Secondary | ICD-10-CM | POA: Diagnosis not present

## 2014-04-29 DIAGNOSIS — M1710 Unilateral primary osteoarthritis, unspecified knee: Secondary | ICD-10-CM | POA: Diagnosis not present

## 2014-04-29 LAB — BASIC METABOLIC PANEL
BUN: 21 mg/dL (ref 6–23)
CHLORIDE: 104 meq/L (ref 96–112)
CO2: 26 mEq/L (ref 19–32)
Calcium: 9.5 mg/dL (ref 8.4–10.5)
Creatinine, Ser: 1.07 mg/dL (ref 0.50–1.35)
GFR calc Af Amer: 75 mL/min — ABNORMAL LOW (ref >90)
GFR, EST NON AFRICAN AMERICAN: 65 mL/min — AB (ref >90)
Glucose, Bld: 109 mg/dL — ABNORMAL HIGH (ref 70–99)
POTASSIUM: 4.4 meq/L (ref 3.7–5.3)
Sodium: 140 mEq/L (ref 137–147)

## 2014-04-29 LAB — URINALYSIS, ROUTINE W REFLEX MICROSCOPIC
BILIRUBIN URINE: NEGATIVE
GLUCOSE, UA: NEGATIVE mg/dL
HGB URINE DIPSTICK: NEGATIVE
KETONES UR: NEGATIVE mg/dL
LEUKOCYTES UA: NEGATIVE
Nitrite: NEGATIVE
PROTEIN: NEGATIVE mg/dL
Specific Gravity, Urine: 1.016 (ref 1.005–1.030)
Urobilinogen, UA: 1 mg/dL (ref 0.0–1.0)
pH: 5.5 (ref 5.0–8.0)

## 2014-04-29 LAB — CBC
HEMATOCRIT: 37.6 % — AB (ref 39.0–52.0)
Hemoglobin: 12.2 g/dL — ABNORMAL LOW (ref 13.0–17.0)
MCH: 30.8 pg (ref 26.0–34.0)
MCHC: 32.4 g/dL (ref 30.0–36.0)
MCV: 94.9 fL (ref 78.0–100.0)
Platelets: 190 10*3/uL (ref 150–400)
RBC: 3.96 MIL/uL — ABNORMAL LOW (ref 4.22–5.81)
RDW: 13.9 % (ref 11.5–15.5)
WBC: 8.2 10*3/uL (ref 4.0–10.5)

## 2014-04-29 LAB — SURGICAL PCR SCREEN
MRSA, PCR: NEGATIVE
Staphylococcus aureus: NEGATIVE

## 2014-04-29 LAB — PROTIME-INR
INR: 1.01 (ref 0.00–1.49)
PROTHROMBIN TIME: 13.1 s (ref 11.6–15.2)

## 2014-04-29 LAB — APTT: aPTT: 31 seconds (ref 24–37)

## 2014-05-01 DIAGNOSIS — M25569 Pain in unspecified knee: Secondary | ICD-10-CM | POA: Diagnosis not present

## 2014-05-02 NOTE — H&P (Signed)
TOTAL KNEE ADMISSION H&P  Patient is being admitted for right total knee arthroplasty.  Subjective:  Chief Complaint:     Right knee OA / pain.  HPI: William [name], 80 y.o. male, has a history of pain and functional disability in the right knee due to arthritis and has failed non-surgical conservative treatments for greater than 12 weeks to include NSAID's and/or analgesics, corticosteriod injections, use of assistive devices and activity modification.  Onset of symptoms was gradual, starting >10 years ago with gradually worsening course since that time. The patient noted no past surgery on the right knee(s).  Patient currently rates pain in the right knee(s) at 7 out of 10 with activity. Patient has worsening of pain with activity and weight bearing, pain that interferes with activities of daily living, pain with passive range of motion, crepitus and joint swelling.  Patient has evidence of periarticular osteophytes and joint space narrowing by imaging studies. There is no active infection.  Risks, benefits and expectations were discussed with the patient.  Risks including but not limited to the risk of anesthesia, blood clots, nerve damage, blood vessel damage, failure of the prosthesis, infection and up to and including death.  Patient understand the risks, benefits and expectations and wishes to proceed with surgery.   PCP: Tommy Medal, MD  D/C Plans:    Home with HHPT  Post-op Meds:       No Rx given  Tranexamic Acid:      is to be given  Decadron:      is not to be given - DM  FYI:     ASA post-op   Norco post-op    HPI: William [name], 79 y.o. male, has a history of pain and functional disability in the left knee due to arthritis and has failed non-surgical conservative treatments for greater than 12 weeks to include NSAID's and/or analgesics, corticosteriod injections, use of assistive devices and activity modification. Onset of symptoms was gradual, starting >10 years ago with  gradually worsening course since that time. The patient noted no past surgery on the left knee(s). Patient currently rates pain in the left knee(s) at 7 out of 10 with activity. Patient has worsening of pain with activity and weight bearing, pain that interferes with activities of daily living, pain with passive range of motion, crepitus and joint swelling. Patient has evidence of periarticular osteophytes and joint space narrowing by imaging studies. There is no active infection. Risks, benefits and expectations were discussed with the patient. Risks including but not limited to the risk of anesthesia, blood clots, nerve damage, blood vessel damage, failure of the prosthesis, infection and up to and including death. Patient understand the risks, benefits and expectations and wishes to proceed with surgery.    Patient Active Problem List   Diagnosis Date Noted  . DJD (degenerative joint disease) of knee 03/25/2014  . Preop cardiovascular exam 03/19/2014  . Abnormal ECG 03/19/2014  . Hypertension   . Diabetes mellitus without complication   . Hypercholesteremia   . Arthritis   . Prostate cancer   . Sleep apnea    Past Medical History  Diagnosis Date  . Hypertension   . Diabetes mellitus without complication   . Hypercholesteremia   . Arthritis   . Prostate cancer     with radiation treatment  . Sleep apnea 2/14    "mild per patient" hasnt gotten CPAP machine    Past Surgical History  Procedure Laterality Date  . Hernia repair    .  Tonsillectomy    . Adenoidectomy    . Lipoma excision    . Other surgical history      s/p prostate radiation  . Flexible sigmoidoscopy N/A 09/12/2013    Procedure: FLEXIBLE SIGMOIDOSCOPY;  Surgeon: Milus Banister, MD;  Location: [facility] ENDOSCOPY;  Service: Endoscopy;  Laterality: N/A;  . Hot hemostasis N/A 09/12/2013    Procedure: HOT HEMOSTASIS (ARGON PLASMA COAGULATION/BICAP);  Surgeon: Milus Banister, MD;  Location: [facility];  Service: Endoscopy;   Laterality: N/A;  . Total knee arthroplasty Left 03/25/2014    Procedure: LEFT TOTAL KNEE ARTHROPLASTY;  Surgeon: [name], MD;  Location: [facility] ORS;  Service: Orthopedics;  Laterality: Left;    No prescriptions prior to admission   Allergies  Allergen Reactions  . Glyburide Other (See Comments)    Hypoglycemia     History  Substance Use Topics  . Smoking status: Former Smoker    Start date: 11/28/1982  . Smokeless tobacco: Never Used  . Alcohol Use: Yes     Comment: occ    Family History  Problem Relation Age of Onset  . Colon cancer Father   . Esophageal cancer Neg Hx   . Rectal cancer Neg Hx   . Stomach cancer Neg Hx      Review of Systems  Constitutional: Negative.   HENT: Negative.   Eyes: Negative.   Respiratory: Negative.   Cardiovascular: Negative.   Gastrointestinal: Negative.   Genitourinary: Positive for frequency.  Musculoskeletal: Positive for joint pain.  Skin: Negative.   Neurological: Negative.   Endo/Heme/Allergies: Negative.   Psychiatric/Behavioral: Negative.     Objective:  Physical Exam  Constitutional: He is oriented to person, place, and time. He appears well-developed and well-nourished.  HENT:  Head: Normocephalic and atraumatic.  Mouth/Throat: Oropharynx is clear and moist.  Eyes: Pupils are equal, round, and reactive to light.  Neck: Neck supple. No JVD present. No tracheal deviation present. No thyromegaly present.  Cardiovascular: Normal rate, regular rhythm, normal heart sounds and intact distal pulses.   Respiratory: Effort normal and breath sounds normal. No stridor. No respiratory distress. He has no wheezes.  GI: Soft. There is no tenderness. There is no guarding.  Musculoskeletal:       Right knee: He exhibits decreased range of motion, swelling and bony tenderness. He exhibits no ecchymosis, no deformity, no laceration and no erythema. Tenderness found.  Lymphadenopathy:    He has no cervical adenopathy.  Neurological:  He is alert and oriented to person, place, and time.  Skin: Skin is warm and dry.  Psychiatric: He has a normal mood and affect.     Labs:  Estimated body mass index is 31.93 kg/(m^2) as calculated from the following:   Height as of 10/11/13: 6\' 1"  (1.854 m).   Weight as of 03/25/14: 109.77 kg (242 lb).   Imaging Review Plain radiographs demonstrate severe degenerative joint disease of the right knee(s). The overall alignment isneutral. The bone quality appears to be good for age and reported activity level.  Assessment/Plan:  End stage arthritis, right knee   The patient history, physical examination, clinical judgment of the provider and imaging studies are consistent with end stage degenerative joint disease of the right knee(s) and total knee arthroplasty is deemed medically necessary. The treatment options including medical management, injection therapy arthroscopy and arthroplasty were discussed at length. The risks and benefits of total knee arthroplasty were presented and reviewed. The risks due to aseptic loosening, infection, stiffness, patella tracking  problems, thromboembolic complications and other imponderables were discussed. The patient acknowledged the explanation, agreed to proceed with the plan and consent was signed. Patient is being admitted for inpatient treatment for surgery, pain control, PT, OT, prophylactic antibiotics, VTE prophylaxis, progressive ambulation and ADL's and discharge planning. The patient is planning to be discharged home with home health services.     West Pugh Mervyn Pflaum   PA-C  05/02/2014, 1:04 PM

## 2014-05-06 ENCOUNTER — Encounter (HOSPITAL_COMMUNITY): Payer: Self-pay | Admitting: *Deleted

## 2014-05-06 ENCOUNTER — Encounter (HOSPITAL_COMMUNITY)
Admission: RE | Disposition: A | Discharge status: Home Health | Payer: Self-pay | Source: Ambulatory Visit | Attending: Orthopedic Surgery

## 2014-05-06 ENCOUNTER — Inpatient Hospital Stay (HOSPITAL_COMMUNITY)
Admission: RE | Admit: 2014-05-06 | Discharge: 2014-05-07 | DRG: 470 | Disposition: A | Discharge status: Home Health | Payer: Medicare Other | Source: Ambulatory Visit | Attending: Orthopedic Surgery | Admitting: Orthopedic Surgery

## 2014-05-06 ENCOUNTER — Inpatient Hospital Stay (HOSPITAL_COMMUNITY): Payer: Medicare Other | Admitting: Anesthesiology

## 2014-05-06 ENCOUNTER — Encounter (HOSPITAL_COMMUNITY): Payer: Medicare Other | Admitting: Anesthesiology

## 2014-05-06 DIAGNOSIS — E669 Obesity, unspecified: Secondary | ICD-10-CM | POA: Diagnosis present

## 2014-05-06 DIAGNOSIS — M6580 Other synovitis and tenosynovitis, unspecified site: Secondary | ICD-10-CM | POA: Diagnosis present

## 2014-05-06 DIAGNOSIS — D500 Iron deficiency anemia secondary to blood loss (chronic): Secondary | ICD-10-CM | POA: Diagnosis not present

## 2014-05-06 DIAGNOSIS — Z8546 Personal history of malignant neoplasm of prostate: Secondary | ICD-10-CM

## 2014-05-06 DIAGNOSIS — E7800 Pure hypercholesterolemia, unspecified: Secondary | ICD-10-CM | POA: Diagnosis present

## 2014-05-06 DIAGNOSIS — D62 Acute posthemorrhagic anemia: Secondary | ICD-10-CM | POA: Diagnosis not present

## 2014-05-06 DIAGNOSIS — Z01812 Encounter for preprocedural laboratory examination: Secondary | ICD-10-CM | POA: Diagnosis not present

## 2014-05-06 DIAGNOSIS — M1710 Unilateral primary osteoarthritis, unspecified knee: Secondary | ICD-10-CM | POA: Diagnosis not present

## 2014-05-06 DIAGNOSIS — I10 Essential (primary) hypertension: Secondary | ICD-10-CM | POA: Diagnosis present

## 2014-05-06 DIAGNOSIS — G4730 Sleep apnea, unspecified: Secondary | ICD-10-CM | POA: Diagnosis present

## 2014-05-06 DIAGNOSIS — M25569 Pain in unspecified knee: Secondary | ICD-10-CM | POA: Diagnosis not present

## 2014-05-06 DIAGNOSIS — C61 Malignant neoplasm of prostate: Secondary | ICD-10-CM | POA: Diagnosis not present

## 2014-05-06 DIAGNOSIS — Z87891 Personal history of nicotine dependence: Secondary | ICD-10-CM | POA: Diagnosis not present

## 2014-05-06 DIAGNOSIS — IMO0002 Reserved for concepts with insufficient information to code with codable children: Secondary | ICD-10-CM | POA: Diagnosis not present

## 2014-05-06 DIAGNOSIS — Z96659 Presence of unspecified artificial knee joint: Secondary | ICD-10-CM

## 2014-05-06 DIAGNOSIS — Z800 Family history of malignant neoplasm of digestive organs: Secondary | ICD-10-CM

## 2014-05-06 DIAGNOSIS — E119 Type 2 diabetes mellitus without complications: Secondary | ICD-10-CM | POA: Diagnosis not present

## 2014-05-06 DIAGNOSIS — Z6832 Body mass index (BMI) 32.0-32.9, adult: Secondary | ICD-10-CM

## 2014-05-06 HISTORY — PX: TOTAL KNEE ARTHROPLASTY: SHX125

## 2014-05-06 LAB — TYPE AND SCREEN
ABO/RH(D): A POS
Antibody Screen: NEGATIVE

## 2014-05-06 LAB — GLUCOSE, CAPILLARY
GLUCOSE-CAPILLARY: 143 mg/dL — AB (ref 70–99)
GLUCOSE-CAPILLARY: 316 mg/dL — AB (ref 70–99)
GLUCOSE-CAPILLARY: 257 mg/dL — AB (ref 70–99)
Glucose-Capillary: 150 mg/dL — ABNORMAL HIGH (ref 70–99)

## 2014-05-06 SURGERY — ARTHROPLASTY, KNEE, TOTAL
Anesthesia: Spinal | Site: Knee | Laterality: Right

## 2014-05-06 MED ORDER — ONDANSETRON HCL 4 MG/2ML IJ SOLN
INTRAMUSCULAR | Status: DC | PRN
  Administered 2014-05-06 (×2): 2 mg via INTRAVENOUS

## 2014-05-06 MED ORDER — PROPOFOL 10 MG/ML IV BOLUS
INTRAVENOUS | Status: AC
  Filled 2014-05-06: qty 20

## 2014-05-06 MED ORDER — MENTHOL 3 MG MT LOZG: 1.0000 | LOZENGE | OROMUCOSAL | Status: DC | PRN

## 2014-05-06 MED ORDER — DIPHENHYDRAMINE HCL 25 MG PO CAPS: 25.0000 mg | ORAL_CAPSULE | Freq: Four times a day (QID) | ORAL | Status: DC | PRN

## 2014-05-06 MED ORDER — PROPOFOL INFUSION 10 MG/ML OPTIME
INTRAVENOUS | Status: DC | PRN
  Administered 2014-05-06: 75 ug/kg/min via INTRAVENOUS

## 2014-05-06 MED ORDER — TRANEXAMIC ACID 100 MG/ML IV SOLN
1000.0000 mg | Freq: Once | INTRAVENOUS | Status: AC
  Administered 2014-05-06: 1000 mg via INTRAVENOUS
  Filled 2014-05-06: qty 10

## 2014-05-06 MED ORDER — MIDAZOLAM HCL 2 MG/2ML IJ SOLN
INTRAMUSCULAR | Status: AC
  Filled 2014-05-06: qty 2

## 2014-05-06 MED ORDER — METHOCARBAMOL 500 MG PO TABS
500.0000 mg | ORAL_TABLET | Freq: Four times a day (QID) | ORAL | Status: DC | PRN
  Administered 2014-05-06 – 2014-05-07 (×2): 500 mg via ORAL
  Filled 2014-05-06 (×2): qty 1

## 2014-05-06 MED ORDER — MAGNESIUM CITRATE PO SOLN: 1.0000 | Freq: Once | ORAL | Status: AC | PRN

## 2014-05-06 MED ORDER — KETOROLAC TROMETHAMINE 30 MG/ML IJ SOLN
INTRAMUSCULAR | Status: AC
  Filled 2014-05-06: qty 1

## 2014-05-06 MED ORDER — PHENOL 1.4 % MT LIQD: 1.0000 | OROMUCOSAL | Status: DC | PRN

## 2014-05-06 MED ORDER — SODIUM CHLORIDE 0.9 % IJ SOLN
INTRAMUSCULAR | Status: AC
  Filled 2014-05-06: qty 10

## 2014-05-06 MED ORDER — ALUM & MAG HYDROXIDE-SIMETH 200-200-20 MG/5ML PO SUSP: 30.0000 mL | ORAL | Status: DC | PRN

## 2014-05-06 MED ORDER — ONDANSETRON HCL 4 MG/2ML IJ SOLN: 4.0000 mg | Freq: Four times a day (QID) | INTRAMUSCULAR | Status: DC | PRN

## 2014-05-06 MED ORDER — SODIUM CHLORIDE 0.9 % IR SOLN
Status: DC | PRN
  Administered 2014-05-06: 1000 mL

## 2014-05-06 MED ORDER — INSULIN DETEMIR 100 UNIT/ML ~~LOC~~ SOLN
7.0000 [IU] | Freq: Every day | SUBCUTANEOUS | Status: DC
  Administered 2014-05-07: 7 [IU] via SUBCUTANEOUS
  Filled 2014-05-06: qty 0.07

## 2014-05-06 MED ORDER — METFORMIN HCL 500 MG PO TABS
1000.0000 mg | ORAL_TABLET | Freq: Two times a day (BID) | ORAL | Status: DC
  Administered 2014-05-06 – 2014-05-07 (×2): 1000 mg via ORAL
  Filled 2014-05-06 (×4): qty 2

## 2014-05-06 MED ORDER — ASPIRIN EC 325 MG PO TBEC
325.0000 mg | DELAYED_RELEASE_TABLET | Freq: Two times a day (BID) | ORAL | Status: DC
  Administered 2014-05-07: 325 mg via ORAL
  Filled 2014-05-06 (×3): qty 1

## 2014-05-06 MED ORDER — MIDAZOLAM HCL 5 MG/5ML IJ SOLN
INTRAMUSCULAR | Status: DC | PRN
  Administered 2014-05-06 (×4): 0.5 mg via INTRAVENOUS

## 2014-05-06 MED ORDER — INSULIN ASPART 100 UNIT/ML ~~LOC~~ SOLN
0.0000 [IU] | Freq: Three times a day (TID) | SUBCUTANEOUS | Status: DC
  Administered 2014-05-06: 11 [IU] via SUBCUTANEOUS
  Administered 2014-05-07: 3 [IU] via SUBCUTANEOUS

## 2014-05-06 MED ORDER — EPHEDRINE SULFATE 50 MG/ML IJ SOLN
INTRAMUSCULAR | Status: AC
  Filled 2014-05-06: qty 1

## 2014-05-06 MED ORDER — KETAMINE HCL 10 MG/ML IJ SOLN
INTRAMUSCULAR | Status: AC
  Filled 2014-05-06: qty 1

## 2014-05-06 MED ORDER — METOCLOPRAMIDE HCL 5 MG/ML IJ SOLN: 5.0000 mg | Freq: Three times a day (TID) | INTRAMUSCULAR | Status: DC | PRN

## 2014-05-06 MED ORDER — FENTANYL CITRATE 0.05 MG/ML IJ SOLN
INTRAMUSCULAR | Status: AC
  Filled 2014-05-06: qty 2

## 2014-05-06 MED ORDER — PHENYLEPHRINE 40 MCG/ML (10ML) SYRINGE FOR IV PUSH (FOR BLOOD PRESSURE SUPPORT)
PREFILLED_SYRINGE | INTRAVENOUS | Status: AC
  Filled 2014-05-06: qty 20

## 2014-05-06 MED ORDER — PROPOFOL 10 MG/ML IV BOLUS
INTRAVENOUS | Status: DC | PRN
  Administered 2014-05-06: 50 mg via INTRAVENOUS

## 2014-05-06 MED ORDER — FENTANYL CITRATE 0.05 MG/ML IJ SOLN: 25.0000 ug | INTRAMUSCULAR | Status: DC | PRN

## 2014-05-06 MED ORDER — BISACODYL 10 MG RE SUPP
10.0000 mg | Freq: Every day | RECTAL | Status: DC | PRN
Start: 2014-05-06 — End: 2014-05-07

## 2014-05-06 MED ORDER — GLYCOPYRROLATE 0.2 MG/ML IJ SOLN
INTRAMUSCULAR | Status: DC | PRN
  Administered 2014-05-06: .05 mg via INTRAVENOUS

## 2014-05-06 MED ORDER — SODIUM CHLORIDE 0.9 % IJ SOLN
INTRAMUSCULAR | Status: DC | PRN
  Administered 2014-05-06: 9 mL

## 2014-05-06 MED ORDER — DOCUSATE SODIUM 100 MG PO CAPS
100.0000 mg | ORAL_CAPSULE | Freq: Two times a day (BID) | ORAL | Status: DC
  Administered 2014-05-06 – 2014-05-07 (×2): 100 mg via ORAL

## 2014-05-06 MED ORDER — CEFAZOLIN SODIUM-DEXTROSE 2-3 GM-% IV SOLR
2.0000 g | INTRAVENOUS | Status: AC
  Administered 2014-05-06: 2 g via INTRAVENOUS

## 2014-05-06 MED ORDER — BUPIVACAINE-EPINEPHRINE (PF) 0.25% -1:200000 IJ SOLN
INTRAMUSCULAR | Status: AC
  Filled 2014-05-06: qty 30

## 2014-05-06 MED ORDER — POTASSIUM CHLORIDE 2 MEQ/ML IV SOLN
INTRAVENOUS | Status: DC
  Administered 2014-05-06: 16:00:00 via INTRAVENOUS
  Filled 2014-05-06 (×3): qty 1000

## 2014-05-06 MED ORDER — BUPIVACAINE LIPOSOME 1.3 % IJ SUSP
20.0000 mL | Freq: Once | INTRAMUSCULAR | Status: AC
  Administered 2014-05-06: 20 mL
  Filled 2014-05-06: qty 20

## 2014-05-06 MED ORDER — PHENYLEPHRINE HCL 10 MG/ML IJ SOLN
INTRAMUSCULAR | Status: DC | PRN
  Administered 2014-05-06: 80 ug via INTRAVENOUS
  Administered 2014-05-06 (×2): 40 ug via INTRAVENOUS
  Administered 2014-05-06: 80 ug via INTRAVENOUS
  Administered 2014-05-06 (×4): 40 ug via INTRAVENOUS

## 2014-05-06 MED ORDER — PROMETHAZINE HCL 25 MG/ML IJ SOLN: 6.2500 mg | INTRAMUSCULAR | Status: DC | PRN

## 2014-05-06 MED ORDER — LACTATED RINGERS IV SOLN
INTRAVENOUS | Status: DC
  Administered 2014-05-06 (×2): via INTRAVENOUS
  Administered 2014-05-06: 1000 mL via INTRAVENOUS

## 2014-05-06 MED ORDER — METHOCARBAMOL 1000 MG/10ML IJ SOLN
500.0000 mg | Freq: Four times a day (QID) | INTRAVENOUS | Status: DC | PRN
  Administered 2014-05-06: 500 mg via INTRAVENOUS
  Filled 2014-05-06: qty 5

## 2014-05-06 MED ORDER — BUPIVACAINE IN DEXTROSE 0.75-8.25 % IT SOLN
INTRATHECAL | Status: DC | PRN
  Administered 2014-05-06: 1.5 mL via INTRATHECAL

## 2014-05-06 MED ORDER — CELECOXIB 200 MG PO CAPS
200.0000 mg | ORAL_CAPSULE | Freq: Two times a day (BID) | ORAL | Status: DC
  Administered 2014-05-06 – 2014-05-07 (×2): 200 mg via ORAL
  Filled 2014-05-06 (×3): qty 1

## 2014-05-06 MED ORDER — METOCLOPRAMIDE HCL 10 MG PO TABS: 5.0000 mg | ORAL_TABLET | Freq: Three times a day (TID) | ORAL | Status: DC | PRN

## 2014-05-06 MED ORDER — FENTANYL CITRATE 0.05 MG/ML IJ SOLN
INTRAMUSCULAR | Status: DC | PRN
  Administered 2014-05-06: 25 ug via INTRAVENOUS
  Administered 2014-05-06: 12.5 ug via INTRAVENOUS
  Administered 2014-05-06: 25 ug via INTRAVENOUS
  Administered 2014-05-06: 12.5 ug via INTRAVENOUS
  Administered 2014-05-06: 25 ug via INTRAVENOUS

## 2014-05-06 MED ORDER — ONDANSETRON HCL 4 MG PO TABS: 4.0000 mg | ORAL_TABLET | Freq: Four times a day (QID) | ORAL | Status: DC | PRN

## 2014-05-06 MED ORDER — METOCLOPRAMIDE HCL 5 MG/ML IJ SOLN
INTRAMUSCULAR | Status: DC | PRN
  Administered 2014-05-06: 10 mg via INTRAVENOUS

## 2014-05-06 MED ORDER — MEPERIDINE HCL 50 MG/ML IJ SOLN: 6.2500 mg | INTRAMUSCULAR | Status: DC | PRN

## 2014-05-06 MED ORDER — LINAGLIPTIN 5 MG PO TABS
5.0000 mg | ORAL_TABLET | Freq: Every day | ORAL | Status: DC
  Administered 2014-05-06 – 2014-05-07 (×2): 5 mg via ORAL
  Filled 2014-05-06 (×2): qty 1

## 2014-05-06 MED ORDER — POLYETHYLENE GLYCOL 3350 17 G PO PACK
17.0000 g | PACK | Freq: Two times a day (BID) | ORAL | Status: DC
  Administered 2014-05-07: 17 g via ORAL

## 2014-05-06 MED ORDER — HYDROMORPHONE HCL PF 1 MG/ML IJ SOLN: 0.5000 mg | INTRAMUSCULAR | Status: DC | PRN

## 2014-05-06 MED ORDER — CEFAZOLIN SODIUM-DEXTROSE 2-3 GM-% IV SOLR
2.0000 g | Freq: Four times a day (QID) | INTRAVENOUS | Status: AC
  Administered 2014-05-06 (×2): 2 g via INTRAVENOUS
  Filled 2014-05-06 (×2): qty 50

## 2014-05-06 MED ORDER — KETOROLAC TROMETHAMINE 30 MG/ML IJ SOLN
INTRAMUSCULAR | Status: DC | PRN
  Administered 2014-05-06: 30 mg

## 2014-05-06 MED ORDER — BUPIVACAINE HCL (PF) 0.25 % IJ SOLN
INTRAMUSCULAR | Status: AC
  Filled 2014-05-06: qty 30

## 2014-05-06 MED ORDER — DEXAMETHASONE SODIUM PHOSPHATE 10 MG/ML IJ SOLN
INTRAMUSCULAR | Status: DC | PRN
  Administered 2014-05-06: 10 mg via INTRAVENOUS

## 2014-05-06 MED ORDER — CEFAZOLIN SODIUM-DEXTROSE 2-3 GM-% IV SOLR
INTRAVENOUS | Status: AC
  Filled 2014-05-06: qty 50

## 2014-05-06 MED ORDER — HYDROCODONE-ACETAMINOPHEN 7.5-325 MG PO TABS
1.0000 | ORAL_TABLET | ORAL | Status: DC
  Administered 2014-05-06: 1 via ORAL
  Administered 2014-05-06 – 2014-05-07 (×3): 2 via ORAL
  Administered 2014-05-07: 1 via ORAL
  Filled 2014-05-06 (×2): qty 2
  Filled 2014-05-06: qty 1
  Filled 2014-05-06 (×2): qty 2

## 2014-05-06 MED ORDER — CHLORHEXIDINE GLUCONATE 4 % EX LIQD: 60.0000 mL | Freq: Once | CUTANEOUS | Status: DC

## 2014-05-06 MED ORDER — SIMVASTATIN 20 MG PO TABS
20.0000 mg | ORAL_TABLET | Freq: Every day | ORAL | Status: DC
  Administered 2014-05-06: 20 mg via ORAL
  Filled 2014-05-06 (×2): qty 1

## 2014-05-06 MED ORDER — FERROUS SULFATE 325 (65 FE) MG PO TABS
325.0000 mg | ORAL_TABLET | Freq: Three times a day (TID) | ORAL | Status: DC
  Administered 2014-05-06 – 2014-05-07 (×2): 325 mg via ORAL
  Filled 2014-05-06 (×5): qty 1

## 2014-05-06 MED ORDER — BUPIVACAINE-EPINEPHRINE (PF) 0.25% -1:200000 IJ SOLN
INTRAMUSCULAR | Status: DC | PRN
  Administered 2014-05-06: 30 mL

## 2014-05-06 MED ORDER — EPHEDRINE SULFATE 50 MG/ML IJ SOLN
INTRAMUSCULAR | Status: DC | PRN
  Administered 2014-05-06 (×3): 5 mg via INTRAVENOUS
  Administered 2014-05-06: 10 mg via INTRAVENOUS
  Administered 2014-05-06 (×2): 5 mg via INTRAVENOUS

## 2014-05-06 MED ORDER — 0.9 % SODIUM CHLORIDE (POUR BTL) OPTIME
TOPICAL | Status: DC | PRN
  Administered 2014-05-06: 1000 mL

## 2014-05-06 SURGICAL SUPPLY — 58 items
ADH SKN CLS APL DERMABOND .7 (GAUZE/BANDAGES/DRESSINGS) ×1
BAG SPEC THK2 15X12 ZIP CLS (MISCELLANEOUS)
BAG ZIPLOCK 12X15 (MISCELLANEOUS) IMPLANT
BANDAGE ELASTIC 6 VELCRO ST LF (GAUZE/BANDAGES/DRESSINGS) ×3 IMPLANT
BANDAGE ESMARK 6X9 LF (GAUZE/BANDAGES/DRESSINGS) ×1 IMPLANT
BLADE SAW SGTL 13.0X1.19X90.0M (BLADE) ×3 IMPLANT
BNDG CMPR 9X6 STRL LF SNTH (GAUZE/BANDAGES/DRESSINGS) ×1
BNDG ESMARK 6X9 LF (GAUZE/BANDAGES/DRESSINGS) ×3
BONE CEMENT GENTAMICIN (Cement) ×6 IMPLANT
BOWL SMART MIX CTS (DISPOSABLE) ×3 IMPLANT
CAPT RP KNEE ×2 IMPLANT
CEMENT BONE GENTAMICIN 40 (Cement) IMPLANT
CUFF TOURN SGL QUICK 34 (TOURNIQUET CUFF) ×3
CUFF TRNQT CYL 34X4X40X1 (TOURNIQUET CUFF) ×1 IMPLANT
DERMABOND ADVANCED (GAUZE/BANDAGES/DRESSINGS) ×2
DERMABOND ADVANCED .7 DNX12 (GAUZE/BANDAGES/DRESSINGS) ×1 IMPLANT
DRAPE EXTREMITY T 121X128X90 (DRAPE) ×3 IMPLANT
DRAPE POUCH INSTRU U-SHP 10X18 (DRAPES) ×3 IMPLANT
DRAPE U-SHAPE 47X51 STRL (DRAPES) ×3 IMPLANT
DRSG AQUACEL AG ADV 3.5X10 (GAUZE/BANDAGES/DRESSINGS) ×3 IMPLANT
DRSG TEGADERM 4X4.75 (GAUZE/BANDAGES/DRESSINGS) IMPLANT
DURAPREP 26ML APPLICATOR (WOUND CARE) ×6 IMPLANT
ELECT REM PT RETURN 9FT ADLT (ELECTROSURGICAL) ×3
ELECTRODE REM PT RTRN 9FT ADLT (ELECTROSURGICAL) ×1 IMPLANT
EVACUATOR 1/8 PVC DRAIN (DRAIN) IMPLANT
FACESHIELD WRAPAROUND (MASK) ×15 IMPLANT
FACESHIELD WRAPAROUND OR TEAM (MASK) ×5 IMPLANT
GAUZE SPONGE 2X2 8PLY STRL LF (GAUZE/BANDAGES/DRESSINGS) IMPLANT
GLOVE BIO SURGEON STRL SZ7 (GLOVE) ×4 IMPLANT
GLOVE BIOGEL PI IND STRL 7.0 (GLOVE) IMPLANT
GLOVE BIOGEL PI IND STRL 7.5 (GLOVE) ×1 IMPLANT
GLOVE BIOGEL PI INDICATOR 7.0 (GLOVE) ×2
GLOVE BIOGEL PI INDICATOR 7.5 (GLOVE) ×4
GLOVE ORTHO TXT STRL SZ7.5 (GLOVE) ×6 IMPLANT
GOWN STRL REIN XL XLG (GOWN DISPOSABLE) ×6 IMPLANT
GOWN STRL REUS W/TWL LRG LVL3 (GOWN DISPOSABLE) ×3 IMPLANT
HANDPIECE INTERPULSE COAX TIP (DISPOSABLE) ×3
KIT BASIN OR (CUSTOM PROCEDURE TRAY) ×3 IMPLANT
MANIFOLD NEPTUNE II (INSTRUMENTS) ×3 IMPLANT
NDL SAFETY ECLIPSE 18X1.5 (NEEDLE) ×1 IMPLANT
NEEDLE HYPO 18GX1.5 SHARP (NEEDLE) ×3
PACK TOTAL JOINT (CUSTOM PROCEDURE TRAY) ×3 IMPLANT
POSITIONER SURGICAL ARM (MISCELLANEOUS) ×3 IMPLANT
SET HNDPC FAN SPRY TIP SCT (DISPOSABLE) ×1 IMPLANT
SET PAD KNEE POSITIONER (MISCELLANEOUS) ×3 IMPLANT
SPONGE GAUZE 2X2 STER 10/PKG (GAUZE/BANDAGES/DRESSINGS)
SUCTION FRAZIER 12FR DISP (SUCTIONS) ×3 IMPLANT
SUT MNCRL AB 4-0 PS2 18 (SUTURE) ×3 IMPLANT
SUT VIC AB 1 CT1 36 (SUTURE) ×3 IMPLANT
SUT VIC AB 2-0 CT1 27 (SUTURE) ×9
SUT VIC AB 2-0 CT1 TAPERPNT 27 (SUTURE) ×3 IMPLANT
SUT VLOC 180 0 24IN GS25 (SUTURE) ×3 IMPLANT
SYR 50ML LL SCALE MARK (SYRINGE) ×3 IMPLANT
TOWEL OR 17X26 10 PK STRL BLUE (TOWEL DISPOSABLE) ×3 IMPLANT
TOWEL OR NON WOVEN STRL DISP B (DISPOSABLE) ×2 IMPLANT
TRAY FOLEY CATH 16FRSI W/METER (SET/KITS/TRAYS/PACK) ×2 IMPLANT
WATER STERILE IRR 1500ML POUR (IV SOLUTION) ×3 IMPLANT
WRAP KNEE MAXI GEL POST OP (GAUZE/BANDAGES/DRESSINGS) ×3 IMPLANT

## 2014-05-06 NOTE — Anesthesia Procedure Notes (Signed)
Spinal  Patient location during procedure: OR Staffing Anesthesiologist: Montez Hageman Performed by: anesthesiologist  Preanesthetic Checklist Completed: patient identified, site marked, surgical consent, pre-op evaluation, timeout performed, IV checked, risks and benefits discussed and monitors and equipment checked Spinal Block Patient position: sitting Prep: Betadine Patient monitoring: heart rate, continuous pulse ox and blood pressure Approach: midline Location: L4-5 Injection technique: single-shot Needle Needle type: Spinocan  Needle gauge: 22 G Needle length: 9 cm Additional Notes Expiration date of kit checked and confirmed. Patient tolerated procedure well, without complications.

## 2014-05-06 NOTE — Anesthesia Preprocedure Evaluation (Signed)
Anesthesia Evaluation  Patient identified by MRN, date of birth, ID band Patient awake    Reviewed: Allergy & Precautions, H&P , NPO status , Patient's Chart, lab work & pertinent test results  Airway Mallampati: II TM Distance: >3 FB Neck ROM: Full    Dental no notable dental hx.    Pulmonary sleep apnea , former smoker,  breath sounds clear to auscultation  Pulmonary exam normal       Cardiovascular hypertension, Pt. on medications Rhythm:Regular Rate:Normal     Neuro/Psych negative neurological ROS  negative psych ROS   GI/Hepatic negative GI ROS, Neg liver ROS,   Endo/Other  diabetes, Oral Hypoglycemic Agents  Renal/GU negative Renal ROS  negative genitourinary   Musculoskeletal negative musculoskeletal ROS (+)   Abdominal   Peds negative pediatric ROS (+)  Hematology negative hematology ROS (+)   Anesthesia Other Findings   Reproductive/Obstetrics negative OB ROS                           Anesthesia Physical  Anesthesia Plan  ASA: III  Anesthesia Plan: Spinal   Post-op Pain Management:    Induction: Intravenous  Airway Management Planned: Simple Face Mask  Additional Equipment:   Intra-op Plan:   Post-operative Plan:   Informed Consent: I have reviewed the patients History and Physical, chart, labs and discussed the procedure including the risks, benefits and alternatives for the proposed anesthesia with the patient or authorized representative who has indicated his/her understanding and acceptance.   Dental advisory given  Plan Discussed with: CRNA and Surgeon  Anesthesia Plan Comments:         Anesthesia Quick Evaluation

## 2014-05-06 NOTE — Interval H&P Note (Signed)
History and Physical Interval Note:  05/06/2014 10:15 AM  William [name]  has presented today for surgery, with the diagnosis of Osteoarthritis right knee  The various methods of treatment have been discussed with the patient and family. After consideration of risks, benefits and other options for treatment, the patient has consented to  Procedure(s): RIGHT TOTAL KNEE ARTHROPLASTY (Right) as a surgical intervention .  The patient's history has been reviewed, patient examined, no change in status, stable for surgery.  I have reviewed the patient's chart and labs.  Questions were answered to the patient's satisfaction.     [name]

## 2014-05-06 NOTE — Transfer of Care (Signed)
Immediate Anesthesia Transfer of Care Note  Patient: William [name]  Procedure(s) Performed: Procedure(s): RIGHT TOTAL KNEE ARTHROPLASTY (Right)  Patient Location: PACU  Anesthesia Type:MAC and Spinal  Level of Consciousness: Patient easily awoken, sedated, comfortable, cooperative, following commands, responds to stimulation.   Airway & Oxygen Therapy: Patient spontaneously breathing, ventilating well, oxygen via simple oxygen mask.  Post-op Assessment: Report given to PACU RN, vital signs reviewed and stable.   Post vital signs: Reviewed and stable.  Complications: No apparent anesthesia complications, addendum added because incomplete note.

## 2014-05-06 NOTE — Op Note (Signed)
NAME:  William [name]                      MEDICAL RECORD NO.:  [id number]                             FACILITY:  [facility]      PHYSICIAN:  Pietro Cassis. Alvan Dame, M.D.  DATE OF BIRTH:  [birth year]      DATE OF PROCEDURE:  05/06/2014                                     OPERATIVE REPORT         PREOPERATIVE DIAGNOSIS:  Right knee osteoarthritis.      POSTOPERATIVE DIAGNOSIS:  Right knee osteoarthritis.      FINDINGS:  The patient was noted to have complete loss of cartilage and   bone-on-bone arthritis with associated osteophytes in all three compartments of   the knee with a significant synovitis and associated effusion.      PROCEDURE:  Right total knee replacement.      COMPONENTS USED:  DePuy rotating platform posterior stabilized knee   system, a size 5 femur, 5 tibia, 12.5 mm PS insert, and 41 patellar   button.      SURGEON:  Pietro Cassis. Alvan Dame, M.D.      ASSISTANT:  Nehemiah Massed, PA-C.      ANESTHESIA:  Spinal.      SPECIMENS:  None.      COMPLICATION:  None.      DRAINS:  None.  EBL: <100      TOURNIQUET TIME:   Total Tourniquet Time Documented: Thigh (Right) - 42 minutes Total: Thigh (Right) - 42 minutes  .      The patient was stable to the recovery room.      INDICATION FOR PROCEDURE:  William [name] is a 80 y.o. male patient of   mine.  The patient had been seen, evaluated, and treated conservatively in the   office with medication, activity modification, and injections.  The patient had   radiographic changes of bone-on-bone arthritis with endplate sclerosis and osteophytes noted.      The patient failed conservative measures including medication, injections, and activity modification, and at this point was ready for more definitive measures.   Based on the radiographic changes and failed conservative measures, the patient   decided to proceed with total knee replacement.  Risks of infection,   DVT, component failure, need for revision surgery, postop course, and    expectations were all   discussed and reviewed.  Consent was obtained for benefit of pain   relief.      PROCEDURE IN DETAIL:  The patient was brought to the operative theater.   Once adequate anesthesia, preoperative antibiotics, 2 gm of Ancef administered, the patient was positioned supine with the right thigh tourniquet placed.  The  right lower extremity was prepped and draped in sterile fashion.  A time-   out was performed identifying the patient, planned procedure, and   extremity.      The right lower extremity was placed in the [facility] leg holder.  The leg was   exsanguinated, tourniquet elevated to 250 mmHg.  A midline incision was   made followed by median parapatellar arthrotomy.  Following initial   exposure, attention was first directed to  the patella.  Precut   measurement was noted to be 26 mm.  I resected down to 14-15 mm and used a   41 patellar button to restore patellar height as well as cover the cut   surface.      The lug holes were drilled and a metal shim was placed to protect the   patella from retractors and saw blades.      At this point, attention was now directed to the femur.  The femoral   canal was opened with a drill, irrigated to try to prevent fat emboli.  An   intramedullary rod was passed at 5 degrees valgus, 11 mm of bone was   resected off the distal femur.  Following this resection, the tibia was   subluxated anteriorly.  Using the extramedullary guide, 10 mm of bone was resected off   the proximal lateral tibia.  We confirmed the gap would be   stable medially and laterally with a 10 mm insert as well as confirmed   the cut was perpendicular in the coronal plane, checking with an alignment rod.      Once this was done, I sized the femur to be a size 5 in the anterior-   posterior dimension, chose a standard component based on medial and   lateral dimension.  The size 5 rotation block was then pinned in   position anterior referenced using  the C-clamp to set rotation.  The   anterior, posterior, and  chamfer cuts were made without difficulty nor   notching making certain that I was along the anterior cortex to help   with flexion gap stability.      The final box cut was made off the lateral aspect of distal femur.      At this point, the tibia was sized to be a size 5, the size 5 tray was   then pinned in position through the medial third of the tubercle,   drilled, and keel punched.  Trial reduction was now carried with a 5 femur,  5 tibia, a 12.5 mm insert, and the 41 patella botton.  The knee was brought to   extension, full extension with good flexion stability with the patella   tracking through the trochlea without application of pressure.  Given   all these findings, the trial components removed.  Final components were   opened and cement was mixed.  The knee was irrigated with normal saline   solution and pulse lavage.  The synovial lining was   then injected with 20cc of Exparel, 30cc of 0.25% Marcaine with epinephrine and 1 cc of Toradol,   total of 61 cc.      The knee was irrigated.  Final implants were then cemented onto clean and   dried cut surfaces of bone with the knee brought to extension with a 12.5 mm trial insert.      Once the cement had fully cured, the excess cement was removed   throughout the knee.  I confirmed I was satisfied with the range of   motion and stability, and the final 12.5 mm PS insert was chosen.  It was   placed into the knee.      The tourniquet had been let down at 42 minutes.  No significant   hemostasis required.  The extensor mechanism was then reapproximated using #1 Vicryl with the knee   in flexion.  The remaining wound was closed with 2-0 Vicryl and running  4-0 Monocryl.   The knee was cleaned, dried, dressed sterilely using Dermabond and   Aquacel dressing.  The patient was then   brought to recovery room in stable condition, tolerating the procedure   well.    Please note that Physician Assistant, Nehemiah Massed, was present for the entirety of the case, and was utilized for pre-operative positioning, peri-operative retractor management, general facilitation of the procedure.  He was also utilized for primary wound closure at the end of the case.              Pietro Cassis Alvan Dame, M.D.    05/06/2014 1:16 PM

## 2014-05-06 NOTE — Transfer of Care (Signed)
Immediate Anesthesia Transfer of Care Note  Patient: William [name]  Procedure(s) Performed: Procedure(s): RIGHT TOTAL KNEE ARTHROPLASTY (Right)  Patient Location: PACU  Anesthesia Type:MAC and Spinal  Level of Consciousness: Patient easily awoken, sedated, comfortable, cooperative, following commands, responds to stimulation.   Airway & Oxygen Therapy: Patient spontaneously breathing, ventilating well, oxygen via simple oxygen mask.  Post-op Assessment: Report given to PACU RN, vital signs reviewed and stable, moving all extremities.   Post vital signs: Reviewed and stable.  Complications: No apparent anesthesia complications

## 2014-05-06 NOTE — Progress Notes (Signed)
Utilization review completed.  

## 2014-05-07 DIAGNOSIS — E669 Obesity, unspecified: Secondary | ICD-10-CM | POA: Diagnosis present

## 2014-05-07 DIAGNOSIS — D500 Iron deficiency anemia secondary to blood loss (chronic): Secondary | ICD-10-CM | POA: Diagnosis not present

## 2014-05-07 LAB — CBC
HCT: 31.8 % — ABNORMAL LOW (ref 39.0–52.0)
Hemoglobin: 10.5 g/dL — ABNORMAL LOW (ref 13.0–17.0)
MCH: 31.1 pg (ref 26.0–34.0)
MCHC: 33 g/dL (ref 30.0–36.0)
MCV: 94.1 fL (ref 78.0–100.0)
PLATELETS: 187 10*3/uL (ref 150–400)
RBC: 3.38 MIL/uL — ABNORMAL LOW (ref 4.22–5.81)
RDW: 13.9 % (ref 11.5–15.5)
WBC: 13.1 10*3/uL — ABNORMAL HIGH (ref 4.0–10.5)

## 2014-05-07 LAB — BASIC METABOLIC PANEL
BUN: 25 mg/dL — ABNORMAL HIGH (ref 6–23)
CALCIUM: 8.8 mg/dL (ref 8.4–10.5)
CO2: 24 mEq/L (ref 19–32)
CREATININE: 1.12 mg/dL (ref 0.50–1.35)
Chloride: 104 mEq/L (ref 96–112)
GFR calc Af Amer: 71 mL/min — ABNORMAL LOW (ref >90)
GFR calc non Af Amer: 61 mL/min — ABNORMAL LOW (ref >90)
Glucose, Bld: 186 mg/dL — ABNORMAL HIGH (ref 70–99)
Potassium: 4.9 mEq/L (ref 3.7–5.3)
SODIUM: 138 meq/L (ref 137–147)

## 2014-05-07 LAB — GLUCOSE, CAPILLARY
Glucose-Capillary: 157 mg/dL — ABNORMAL HIGH (ref 70–99)
Glucose-Capillary: 135 mg/dL — ABNORMAL HIGH (ref 70–99)

## 2014-05-07 MED ORDER — METHOCARBAMOL 500 MG PO TABS: 500.0000 mg | ORAL_TABLET | Freq: Four times a day (QID) | ORAL | Status: DC | PRN

## 2014-05-07 MED ORDER — FERROUS SULFATE 325 (65 FE) MG PO TABS: 325.0000 mg | ORAL_TABLET | Freq: Three times a day (TID) | ORAL | Status: DC

## 2014-05-07 MED ORDER — DSS 100 MG PO CAPS: 100.0000 mg | ORAL_CAPSULE | Freq: Two times a day (BID) | ORAL | Status: DC

## 2014-05-07 MED ORDER — POLYETHYLENE GLYCOL 3350 17 G PO PACK: 17.0000 g | PACK | Freq: Two times a day (BID) | ORAL | Status: DC

## 2014-05-07 MED ORDER — ASPIRIN 325 MG PO TBEC: 325.0000 mg | DELAYED_RELEASE_TABLET | Freq: Two times a day (BID) | ORAL | Status: AC

## 2014-05-07 MED ORDER — HYDROCODONE-ACETAMINOPHEN 7.5-325 MG PO TABS: 1.0000 | ORAL_TABLET | ORAL | Status: DC | PRN

## 2014-05-07 NOTE — Progress Notes (Signed)
   Subjective: 1 Day Post-Op Procedure(s) (LRB): RIGHT TOTAL KNEE ARTHROPLASTY (Right)   Patient reports pain as mild, pain well controlled. No events throughout the night. Denies any CP or SOB. Looking forward to working with PT. Ready to be discharged home fi he does well with PT and pain stays controlled.   Objective:   VITALS:   Filed Vitals:   05/07/14 0631  BP: 154/60  Pulse: 64  Temp: 98 F (36.7 C)  Resp: 18    Neurovascular intact Dorsiflexion/Plantar flexion intact Incision: dressing C/D/I No cellulitis present Compartment soft  LABS  Recent Labs  05/07/14 0418  HGB 10.5*  HCT 31.8*  WBC 13.1*  PLT 187     Recent Labs  05/07/14 0418  NA 138  K 4.9  BUN 25*  CREATININE 1.12  GLUCOSE 186*     Assessment/Plan: 1 Day Post-Op Procedure(s) (LRB): RIGHT TOTAL KNEE ARTHROPLASTY (Right) Foley cath d/c'ed Advance diet Up with therapy D/C IV fluids Discharge home with home health Follow up in 2 weeks at [facility]. Follow up with OLIN,Shaneequa Bahner D in 2 weeks.  Contact information:  [facility] [address], [place] [phone]    Expected ABLA  Treated with iron and will observe  Obese (BMI 30-39.9) Estimated body mass index is 32.33 kg/(m^2) as calculated from the following:   Height as of this encounter: 6\' 1"  (1.854 m).   Weight as of this encounter: 111.131 kg (245 lb). Patient also counseled that weight may inhibit the healing process Patient counseled that losing weight will help with future health issues         West Pugh. Gerrick Ray   PAC  05/07/2014, 8:22 AM

## 2014-05-07 NOTE — Progress Notes (Signed)
OT Cancellation Note  Patient Details Name: William [name] MRN: [id number] DOB: March 19, 1934   Cancelled Treatment:    Reason Eval/Treat Not Completed: Other (comment).  Pt had other knee surgery in 4/15. No OT needs at this time.  Cadince Hilscher 05/07/2014, 11:31 AM Lesle Chris, OTR/L [phone] 05/07/2014

## 2014-05-07 NOTE — Anesthesia Postprocedure Evaluation (Signed)
  Anesthesia Post-op Note  Patient: William [name]  Procedure(s) Performed: Procedure(s) (LRB): RIGHT TOTAL KNEE ARTHROPLASTY (Right)  Patient Location: PACU  Anesthesia Type: Spinal  Level of Consciousness: awake and alert   Airway and Oxygen Therapy: Patient Spontanous Breathing  Post-op Pain: mild  Post-op Assessment: Post-op Vital signs reviewed, Patient's Cardiovascular Status Stable, Respiratory Function Stable, Patent Airway and No signs of Nausea or vomiting  Last Vitals:  Filed Vitals:   05/07/14 0631  BP: 154/60  Pulse: 64  Temp: 36.7 C  Resp: 18    Post-op Vital Signs: stable   Complications: No apparent anesthesia complications

## 2014-05-07 NOTE — Discharge Summary (Signed)
Physician Discharge Summary  Patient ID: [name] MRN: [id number] DOB/AGE: Dec 27, 1933 80 y.o.  Admit date: 05/06/2014 Discharge date: 05/07/2014   Procedures:  Procedure(s) (LRB): RIGHT TOTAL KNEE ARTHROPLASTY (Right)  Attending Physician:  Dr. Paralee Cancel   Admission Diagnoses:   Right knee OA / pain  Discharge Diagnoses:  Principal Problem:   S/P right TKA Active Problems:   Obese   Expected blood loss anemia  Past Medical History  Diagnosis Date  . Hypertension   . Diabetes mellitus without complication   . Hypercholesteremia   . Arthritis   . Prostate cancer     with radiation treatment  . Sleep apnea 2/14    "mild per patient" hasnt gotten CPAP machine    HPI: William [name], 80 y.o. male, has a history of pain and functional disability in the right knee due to arthritis and has failed non-surgical conservative treatments for greater than 12 weeks to include NSAID's and/or analgesics, corticosteriod injections, use of assistive devices and activity modification. Onset of symptoms was gradual, starting >10 years ago with gradually worsening course since that time. The patient noted no past surgery on the right knee(s). Patient currently rates pain in the right knee(s) at 7 out of 10 with activity. Patient has worsening of pain with activity and weight bearing, pain that interferes with activities of daily living, pain with passive range of motion, crepitus and joint swelling. Patient has evidence of periarticular osteophytes and joint space narrowing by imaging studies. There is no active infection. Risks, benefits and expectations were discussed with the patient. Risks including but not limited to the risk of anesthesia, blood clots, nerve damage, blood vessel damage, failure of the prosthesis, infection and up to and including death. Patient understand the risks, benefits and expectations and wishes to proceed with surgery.   PCP: Tommy Medal, MD   Discharged  Condition: good  Hospital Course:  Patient underwent the above stated procedure on 05/06/2014. Patient tolerated the procedure well and brought to the recovery room in good condition and subsequently to the floor.  POD #1 BP: 154/60 ; Pulse: 64 ; Temp: 98 F (36.7 C) ; Resp: 18 Patient reports pain as mild, pain well controlled. No events throughout the night. Denies any CP or SOB. Looking forward to working with PT. Ready to be discharged home. Neurovascular intact, dorsiflexion/plantar flexion intact, incision: dressing C/D/I, no cellulitis present and compartment soft.   LABS  Basename    HGB  10.5  HCT  31.8    Discharge Exam: General appearance: alert, cooperative and no distress Extremities: Homans sign is negative, no sign of DVT, no edema, redness or tenderness in the calves or thighs and no ulcers, gangrene or trophic changes  Disposition: Home with follow up in 2 weeks   Follow-up Information   Follow up with [name], MD. Schedule an appointment as soon as possible for a visit in 2 weeks.   Specialty:  Orthopedic Surgery   Contact information:   [address] [place] [address] [phone]       Discharge Instructions   Call MD / Call 911    Complete by:  As directed   If you experience chest pain or shortness of breath, CALL 911 and be transported to the hospital emergency room.  If you develope a fever above 101 F, pus (white drainage) or increased drainage or redness at the wound, or calf pain, call your surgeon's office.     Change dressing  Complete by:  As directed   Maintain surgical dressing for 10-14 days, or until follow up in the clinic.     Constipation Prevention    Complete by:  As directed   Drink plenty of fluids.  Prune juice may be helpful.  You may use a stool softener, such as Colace (over the counter) 100 mg twice a day.  Use MiraLax (over the counter) for constipation as needed.     Diet - low sodium heart healthy     Complete by:  As directed      Discharge instructions    Complete by:  As directed   Maintain surgical dressing for 10-14 days, or until follow up in the clinic. Follow up in 2 weeks at [facility]. Call with any questions or concerns.     Driving restrictions    Complete by:  As directed   No driving for 4 weeks     Increase activity slowly as tolerated    Complete by:  As directed      TED hose    Complete by:  As directed   Use stockings (TED hose) for 2 weeks on both leg(s).  You may remove them at night for sleeping.     Weight bearing as tolerated    Complete by:  As directed              Medication List    STOP taking these medications       acetaminophen 500 MG tablet  Commonly known as:  TYLENOL     diclofenac 75 MG EC tablet  Commonly known as:  VOLTAREN     diclofenac sodium 1 % Gel  Commonly known as:  VOLTAREN      TAKE these medications       aspirin 325 MG EC tablet  Take 1 tablet (325 mg total) by mouth 2 (two) times daily.     co-enzyme Q-10 30 MG capsule  Take 300 mg by mouth daily.     DSS 100 MG Caps  Take 100 mg by mouth 2 (two) times daily.     ferrous sulfate 325 (65 FE) MG tablet  Take 1 tablet (325 mg total) by mouth 3 (three) times daily after meals.     GLUCOSAMINE CHONDR 1500 COMPLX Caps  Take 1 capsule by mouth 2 (two) times daily.     HYDROcodone-acetaminophen 7.5-325 MG per tablet  Commonly known as:  NORCO  Take 1-2 tablets by mouth every 4 (four) hours as needed for moderate pain.     LEVEMIR FLEXTOUCH 100 UNIT/ML Pen  Generic drug:  Insulin Detemir  Inject 7 Units into the skin daily before lunch.     lisinopril-hydrochlorothiazide 10-12.5 MG per tablet  Commonly known as:  PRINZIDE,ZESTORETIC  Take 1 tablet by mouth every morning.     metFORMIN 1000 MG tablet  Commonly known as:  GLUCOPHAGE  Take 1,000 mg by mouth 2 (two) times daily with a meal.     methocarbamol 500 MG tablet  Commonly known as:   ROBAXIN  Take 1 tablet (500 mg total) by mouth every 6 (six) hours as needed for muscle spasms.     polyethylene glycol packet  Commonly known as:  MIRALAX / GLYCOLAX  Take 17 g by mouth 2 (two) times daily.     pravastatin 40 MG tablet  Commonly known as:  PRAVACHOL  Take 40 mg by mouth daily with supper.     saxagliptin HCl 2.5 MG  Tabs tablet  Commonly known as:  ONGLYZA  Take 5 mg by mouth every morning.         Signed: West Pugh. Denton Derks   PA-C  05/07/2014, 5:07 PM

## 2014-05-07 NOTE — Evaluation (Signed)
Physical Therapy Evaluation Patient Details Name: William [name] MRN: [id number] DOB: [birth year] Today's Date: 05/07/2014   History of Present Illness  80 yo male s/p  R TKA   Clinical Impression  Pt doing well this am, pain 3/10; would like to go home today but will see for second time prior to D/C    Follow Up Recommendations Home health PT    Equipment Recommendations  None recommended by PT    Recommendations for Other Services       Precautions / Restrictions Precautions Precautions: Knee Restrictions Weight Bearing Restrictions: No Other Position/Activity Restrictions: WBAT      Mobility  Bed Mobility Overal bed mobility: Needs Assistance Bed Mobility: Supine to Sit     Supine to sit: Supervision     General bed mobility comments: for safety  Transfers Overall transfer level: Needs assistance Equipment used: Rolling walker (2 wheeled) Transfers: Sit to/from Stand Sit to Stand: Min assist;From elevated surface         General transfer comment: cues for hand placement  Ambulation/Gait Ambulation/Gait assistance: Min assist;Min guard Ambulation Distance (Feet): 80 Feet (15') Assistive device: Rolling walker (2 wheeled) Gait Pattern/deviations: Step-to pattern;Antalgic;Decreased weight shift to right     General Gait Details: cues initially for sequence  Stairs            Wheelchair Mobility    Modified Rankin (Stroke Patients Only)       Balance                                             Pertinent Vitals/Pain     Home Living Family/patient expects to be discharged to:: Private residence Living Arrangements: Spouse/significant other Available Help at Discharge: Family Type of Home: House Home Access: Stairs to enter   Technical brewer of Steps: 1+ threshold Home Layout: One level Home Equipment: Environmental consultant - 2 wheels;Cane - single point;Adaptive equipment;Grab bars - toilet;Grab bars - tub/shower       Prior Function Level of Independence: Independent         Comments: few weeks I, no cane     Hand Dominance        Extremity/Trunk Assessment   Upper Extremity Assessment: Defer to OT evaluation           Lower Extremity Assessment: RLE deficits/detail;LLE deficits/detail RLE Deficits / Details: AAROM knee flexion to 85*' ankle WFL, able to do SLR with ~5 to 10* quad lag;  LLE Deficits / Details: L TKA 6 wks ago     Communication   Communication: No difficulties  Cognition Arousal/Alertness: Awake/alert Behavior During Therapy: WFL for tasks assessed/performed Overall Cognitive Status: Within Functional Limits for tasks assessed                      General Comments      Exercises Total Joint Exercises Ankle Circles/Pumps: AROM;Both;10 reps Quad Sets: 10 reps;AROM;Both;Strengthening Heel Slides: AROM;10 reps;AAROM Goniometric ROM: ~85      Assessment/Plan    PT Assessment Patient needs continued PT services  PT Diagnosis Difficulty walking   PT Problem List Decreased range of motion;Decreased activity tolerance  PT Treatment Interventions DME instruction;Gait training;Functional mobility training;Therapeutic exercise;Patient/family education   PT Goals (Current goals can be found in the Care Plan section) Acute Rehab PT Goals Patient Stated Goal: home PT Goal Formulation: With patient Time For  Goal Achievement: 05/08/14 Potential to Achieve Goals: Good    Frequency 7X/week   Barriers to discharge        Co-evaluation               End of Session Equipment Utilized During Treatment: Gait belt Activity Tolerance: Patient tolerated treatment well Patient left: in chair;with family/visitor present;with call bell/phone within reach Nurse Communication: Mobility status         Time: 8811-0315 PT Time Calculation (min): 25 min   Charges:   PT Evaluation $Initial PT Evaluation Tier I: 1 Procedure PT Treatments $Gait Training:  23-37 mins   PT G Codes:          Skie Vitrano 13-May-2014, 9:32 AM

## 2014-05-07 NOTE — Progress Notes (Signed)
Physical Therapy Treatment Patient Details Name: DIERRE CREVIER MRN: [id number] DOB: December 14, 1933 Today's Date: 05/27/2014    History of Present Illness 80 yo male s/p  R TKA     PT Comments    Progressing well  Follow Up Recommendations  Home health PT     Equipment Recommendations  None recommended by PT    Recommendations for Other Services       Precautions / Restrictions Precautions Precautions: Knee Restrictions Other Position/Activity Restrictions: WBAT    Mobility  Bed Mobility                  Transfers Overall transfer level: Needs assistance Equipment used: Rolling walker (2 wheeled) Transfers: Sit to/from Stand Sit to Stand: Min guard         General transfer comment: cues for hand placement  Ambulation/Gait Ambulation/Gait assistance: Supervision;Min guard Ambulation Distance (Feet): 100 Feet Assistive device: Rolling walker (2 wheeled) Gait Pattern/deviations: Step-to pattern;Antalgic;Decreased step length - right;Decreased step length - left     General Gait Details: cues for wt shift to hands if painful   Stairs            Wheelchair Mobility    Modified Rankin (Stroke Patients Only)       Balance                                    Cognition Arousal/Alertness: Awake/alert Behavior During Therapy: WFL for tasks assessed/performed Overall Cognitive Status: Within Functional Limits for tasks assessed                      Exercises Total Joint Exercises Heel Slides: AROM;10 reps;AAROM Hip ABduction/ADduction: AROM;AAROM;Right;15 reps Straight Leg Raises: Strengthening;AROM;Right;15 reps Knee Flexion: AROM;5 reps;Right Goniometric ROM: 95*    General Comments        Pertinent Vitals/Pain 5/10 at worst, with flexion 2-3/10 at rest    Home Living                      Prior Function            PT Goals (current goals can now be found in the care plan section) Acute Rehab PT  Goals Patient Stated Goal: home PT Goal Formulation: With patient Time For Goal Achievement: 05/08/14 Potential to Achieve Goals: Good Progress towards PT goals: Progressing toward goals    Frequency  7X/week    PT Plan Current plan remains appropriate    Co-evaluation             End of Session   Activity Tolerance: Patient tolerated treatment well Patient left: in chair;with family/visitor present;with call bell/phone within reach     Time: 1355-1419 PT Time Calculation (min): 24 min  Charges:  $Gait Training: 8-22 mins $Therapeutic Exercise: 8-22 mins                    G Codes:      Marvia Troost May 27, 2014, 2:24 PM

## 2014-05-08 DIAGNOSIS — E119 Type 2 diabetes mellitus without complications: Secondary | ICD-10-CM | POA: Diagnosis not present

## 2014-05-08 DIAGNOSIS — Z471 Aftercare following joint replacement surgery: Secondary | ICD-10-CM | POA: Diagnosis not present

## 2014-05-08 DIAGNOSIS — Z96659 Presence of unspecified artificial knee joint: Secondary | ICD-10-CM | POA: Diagnosis not present

## 2014-05-08 DIAGNOSIS — IMO0001 Reserved for inherently not codable concepts without codable children: Secondary | ICD-10-CM | POA: Diagnosis not present

## 2014-05-08 DIAGNOSIS — I10 Essential (primary) hypertension: Secondary | ICD-10-CM | POA: Diagnosis not present

## 2014-05-09 ENCOUNTER — Encounter (HOSPITAL_COMMUNITY): Payer: Self-pay | Admitting: *Deleted

## 2014-05-12 DIAGNOSIS — IMO0001 Reserved for inherently not codable concepts without codable children: Secondary | ICD-10-CM | POA: Diagnosis not present

## 2014-05-12 DIAGNOSIS — I10 Essential (primary) hypertension: Secondary | ICD-10-CM | POA: Diagnosis not present

## 2014-05-12 DIAGNOSIS — Z96659 Presence of unspecified artificial knee joint: Secondary | ICD-10-CM | POA: Diagnosis not present

## 2014-05-12 DIAGNOSIS — E119 Type 2 diabetes mellitus without complications: Secondary | ICD-10-CM | POA: Diagnosis not present

## 2014-05-12 DIAGNOSIS — Z471 Aftercare following joint replacement surgery: Secondary | ICD-10-CM | POA: Diagnosis not present

## 2014-05-13 DIAGNOSIS — Z96659 Presence of unspecified artificial knee joint: Secondary | ICD-10-CM | POA: Diagnosis not present

## 2014-05-13 DIAGNOSIS — I10 Essential (primary) hypertension: Secondary | ICD-10-CM | POA: Diagnosis not present

## 2014-05-13 DIAGNOSIS — Z471 Aftercare following joint replacement surgery: Secondary | ICD-10-CM | POA: Diagnosis not present

## 2014-05-13 DIAGNOSIS — E119 Type 2 diabetes mellitus without complications: Secondary | ICD-10-CM | POA: Diagnosis not present

## 2014-05-13 DIAGNOSIS — IMO0001 Reserved for inherently not codable concepts without codable children: Secondary | ICD-10-CM | POA: Diagnosis not present

## 2014-05-14 DIAGNOSIS — I10 Essential (primary) hypertension: Secondary | ICD-10-CM | POA: Diagnosis not present

## 2014-05-14 DIAGNOSIS — Z471 Aftercare following joint replacement surgery: Secondary | ICD-10-CM | POA: Diagnosis not present

## 2014-05-14 DIAGNOSIS — E119 Type 2 diabetes mellitus without complications: Secondary | ICD-10-CM | POA: Diagnosis not present

## 2014-05-14 DIAGNOSIS — IMO0001 Reserved for inherently not codable concepts without codable children: Secondary | ICD-10-CM | POA: Diagnosis not present

## 2014-05-14 DIAGNOSIS — Z96659 Presence of unspecified artificial knee joint: Secondary | ICD-10-CM | POA: Diagnosis not present

## 2014-05-15 DIAGNOSIS — E119 Type 2 diabetes mellitus without complications: Secondary | ICD-10-CM | POA: Diagnosis not present

## 2014-05-15 DIAGNOSIS — I10 Essential (primary) hypertension: Secondary | ICD-10-CM | POA: Diagnosis not present

## 2014-05-15 DIAGNOSIS — IMO0001 Reserved for inherently not codable concepts without codable children: Secondary | ICD-10-CM | POA: Diagnosis not present

## 2014-05-15 DIAGNOSIS — Z96659 Presence of unspecified artificial knee joint: Secondary | ICD-10-CM | POA: Diagnosis not present

## 2014-05-15 DIAGNOSIS — Z471 Aftercare following joint replacement surgery: Secondary | ICD-10-CM | POA: Diagnosis not present

## 2014-05-16 DIAGNOSIS — Z96659 Presence of unspecified artificial knee joint: Secondary | ICD-10-CM | POA: Diagnosis not present

## 2014-05-16 DIAGNOSIS — IMO0001 Reserved for inherently not codable concepts without codable children: Secondary | ICD-10-CM | POA: Diagnosis not present

## 2014-05-16 DIAGNOSIS — E119 Type 2 diabetes mellitus without complications: Secondary | ICD-10-CM | POA: Diagnosis not present

## 2014-05-16 DIAGNOSIS — I10 Essential (primary) hypertension: Secondary | ICD-10-CM | POA: Diagnosis not present

## 2014-05-16 DIAGNOSIS — Z471 Aftercare following joint replacement surgery: Secondary | ICD-10-CM | POA: Diagnosis not present

## 2014-05-19 DIAGNOSIS — E119 Type 2 diabetes mellitus without complications: Secondary | ICD-10-CM | POA: Diagnosis not present

## 2014-05-19 DIAGNOSIS — Z96659 Presence of unspecified artificial knee joint: Secondary | ICD-10-CM | POA: Diagnosis not present

## 2014-05-19 DIAGNOSIS — IMO0001 Reserved for inherently not codable concepts without codable children: Secondary | ICD-10-CM | POA: Diagnosis not present

## 2014-05-19 DIAGNOSIS — I10 Essential (primary) hypertension: Secondary | ICD-10-CM | POA: Diagnosis not present

## 2014-05-19 DIAGNOSIS — Z471 Aftercare following joint replacement surgery: Secondary | ICD-10-CM | POA: Diagnosis not present

## 2014-05-22 DIAGNOSIS — I10 Essential (primary) hypertension: Secondary | ICD-10-CM | POA: Diagnosis not present

## 2014-05-22 DIAGNOSIS — Z471 Aftercare following joint replacement surgery: Secondary | ICD-10-CM | POA: Diagnosis not present

## 2014-05-22 DIAGNOSIS — IMO0001 Reserved for inherently not codable concepts without codable children: Secondary | ICD-10-CM | POA: Diagnosis not present

## 2014-05-22 DIAGNOSIS — E119 Type 2 diabetes mellitus without complications: Secondary | ICD-10-CM | POA: Diagnosis not present

## 2014-05-22 DIAGNOSIS — Z96659 Presence of unspecified artificial knee joint: Secondary | ICD-10-CM | POA: Diagnosis not present

## 2014-05-23 DIAGNOSIS — Z96659 Presence of unspecified artificial knee joint: Secondary | ICD-10-CM | POA: Diagnosis not present

## 2014-05-23 DIAGNOSIS — E119 Type 2 diabetes mellitus without complications: Secondary | ICD-10-CM | POA: Diagnosis not present

## 2014-05-23 DIAGNOSIS — IMO0001 Reserved for inherently not codable concepts without codable children: Secondary | ICD-10-CM | POA: Diagnosis not present

## 2014-05-23 DIAGNOSIS — Z471 Aftercare following joint replacement surgery: Secondary | ICD-10-CM | POA: Diagnosis not present

## 2014-05-23 DIAGNOSIS — I10 Essential (primary) hypertension: Secondary | ICD-10-CM | POA: Diagnosis not present

## 2014-05-26 DIAGNOSIS — M25569 Pain in unspecified knee: Secondary | ICD-10-CM | POA: Diagnosis not present

## 2014-05-28 DIAGNOSIS — M25569 Pain in unspecified knee: Secondary | ICD-10-CM | POA: Diagnosis not present

## 2014-06-02 DIAGNOSIS — M25569 Pain in unspecified knee: Secondary | ICD-10-CM | POA: Diagnosis not present

## 2014-06-04 DIAGNOSIS — M25569 Pain in unspecified knee: Secondary | ICD-10-CM | POA: Diagnosis not present

## 2014-06-09 DIAGNOSIS — M25569 Pain in unspecified knee: Secondary | ICD-10-CM | POA: Diagnosis not present

## 2014-06-13 DIAGNOSIS — M25569 Pain in unspecified knee: Secondary | ICD-10-CM | POA: Diagnosis not present

## 2014-06-13 DIAGNOSIS — Z96659 Presence of unspecified artificial knee joint: Secondary | ICD-10-CM | POA: Diagnosis not present

## 2014-06-13 DIAGNOSIS — Z471 Aftercare following joint replacement surgery: Secondary | ICD-10-CM | POA: Diagnosis not present

## 2014-06-16 DIAGNOSIS — M25569 Pain in unspecified knee: Secondary | ICD-10-CM | POA: Diagnosis not present

## 2014-06-19 DIAGNOSIS — M25569 Pain in unspecified knee: Secondary | ICD-10-CM | POA: Diagnosis not present

## 2014-07-17 DIAGNOSIS — N390 Urinary tract infection, site not specified: Secondary | ICD-10-CM | POA: Diagnosis not present

## 2014-07-17 DIAGNOSIS — C61 Malignant neoplasm of prostate: Secondary | ICD-10-CM | POA: Diagnosis not present

## 2014-07-24 DIAGNOSIS — E7800 Pure hypercholesterolemia, unspecified: Secondary | ICD-10-CM | POA: Diagnosis not present

## 2014-07-24 DIAGNOSIS — N400 Enlarged prostate without lower urinary tract symptoms: Secondary | ICD-10-CM | POA: Diagnosis not present

## 2014-07-24 DIAGNOSIS — E119 Type 2 diabetes mellitus without complications: Secondary | ICD-10-CM | POA: Diagnosis not present

## 2014-07-28 DIAGNOSIS — E119 Type 2 diabetes mellitus without complications: Secondary | ICD-10-CM | POA: Diagnosis not present

## 2014-07-28 DIAGNOSIS — N400 Enlarged prostate without lower urinary tract symptoms: Secondary | ICD-10-CM | POA: Diagnosis not present

## 2014-07-28 DIAGNOSIS — I10 Essential (primary) hypertension: Secondary | ICD-10-CM | POA: Diagnosis not present

## 2014-07-28 DIAGNOSIS — E785 Hyperlipidemia, unspecified: Secondary | ICD-10-CM | POA: Diagnosis not present

## 2014-08-07 ENCOUNTER — Ambulatory Visit (INDEPENDENT_AMBULATORY_CARE_PROVIDER_SITE_OTHER): Payer: Medicare Other | Admitting: Podiatry

## 2014-08-07 ENCOUNTER — Encounter: Payer: Self-pay | Admitting: Podiatry

## 2014-08-07 VITALS — BP 144/66 | HR 66 | Resp 16

## 2014-08-07 DIAGNOSIS — E1149 Type 2 diabetes mellitus with other diabetic neurological complication: Secondary | ICD-10-CM

## 2014-08-07 DIAGNOSIS — M79609 Pain in unspecified limb: Secondary | ICD-10-CM | POA: Diagnosis not present

## 2014-08-07 DIAGNOSIS — B351 Tinea unguium: Secondary | ICD-10-CM

## 2014-08-07 DIAGNOSIS — M79673 Pain in unspecified foot: Secondary | ICD-10-CM

## 2014-08-07 DIAGNOSIS — Z23 Encounter for immunization: Secondary | ICD-10-CM | POA: Diagnosis not present

## 2014-08-07 NOTE — Patient Instructions (Signed)
Diabetes and Foot Care Diabetes may cause you to have problems because of poor blood supply (circulation) to your feet and legs. This may cause the skin on your feet to become thinner, break easier, and heal more slowly. Your skin may become dry, and the skin may peel and crack. You may also have nerve damage in your legs and feet causing decreased feeling in them. You may not notice minor injuries to your feet that could lead to infections or more serious problems. Taking care of your feet is one of the most important things you can do for yourself.  HOME CARE INSTRUCTIONS  Wear shoes at all times, even in the house. Do not go barefoot. Bare feet are easily injured.  Check your feet daily for blisters, cuts, and redness. If you cannot see the bottom of your feet, use a mirror or ask someone for help.  Wash your feet with warm water (do not use hot water) and mild soap. Then pat your feet and the areas between your toes until they are completely dry. Do not soak your feet as this can dry your skin.  Apply a moisturizing lotion or petroleum jelly (that does not contain alcohol and is unscented) to the skin on your feet and to dry, brittle toenails. Do not apply lotion between your toes.  Trim your toenails straight across. Do not dig under them or around the cuticle. File the edges of your nails with an emery board or nail file.  Do not cut corns or calluses or try to remove them with medicine.  Wear clean socks or stockings every day. Make sure they are not too tight. Do not wear knee-high stockings since they may decrease blood flow to your legs.  Wear shoes that fit properly and have enough cushioning. To break in new shoes, wear them for just a few hours a day. This prevents you from injuring your feet. Always look in your shoes before you put them on to be sure there are no objects inside.  Do not cross your legs. This may decrease the blood flow to your feet.  If you find a minor scrape,  cut, or break in the skin on your feet, keep it and the skin around it clean and dry. These areas may be cleansed with mild soap and water. Do not cleanse the area with peroxide, alcohol, or iodine.  When you remove an adhesive bandage, be sure not to damage the skin around it.  If you have a wound, look at it several times a day to make sure it is healing.  Do not use heating pads or hot water bottles. They may burn your skin. If you have lost feeling in your feet or legs, you may not know it is happening until it is too late.  Make sure your health care provider performs a complete foot exam at least annually or more often if you have foot problems. Report any cuts, sores, or bruises to your health care provider immediately. SEEK MEDICAL CARE IF:   You have an injury that is not healing.  You have cuts or breaks in the skin.  You have an ingrown nail.  You notice redness on your legs or feet.  You feel burning or tingling in your legs or feet.  You have pain or cramps in your legs and feet.  Your legs or feet are numb.  Your feet always feel cold. SEEK IMMEDIATE MEDICAL CARE IF:   There is increasing redness,   swelling, or pain in or around a wound.  There is a red line that goes up your leg.  Pus is coming from a wound.  You develop a fever or as directed by your health care provider.  You notice a bad smell coming from an ulcer or wound. Document Released: 11/11/2000 Document Revised: 07/17/2013 Document Reviewed: 04/23/2013 Exit[employer] Patient Information 2015 [employer]. This information is not intended to replace advice given to you by your health care provider. Make sure you discuss any questions you have with your health care provider.  

## 2014-08-07 NOTE — Progress Notes (Signed)
Subjective:     Patient ID: William [name], male   DOB: 02-07-1934, 80 y.o.   MRN: [id number]  HPI patient presents with 16 years history of diabetes with nail disease that are thick painful and he cannot cut and also swelling in his ankles which is been present for a long period of time. States his A1c has been running well around 7.0   Review of Systems  All other systems reviewed and are negative.      Objective:   Physical Exam  Nursing note and vitals reviewed. Constitutional: He is oriented to person, place, and time.  Cardiovascular: Intact distal pulses.   Musculoskeletal: Normal range of motion.  Neurological: He is oriented to person, place, and time.  Skin: Skin is warm and dry.   neurovascular status found to be intact with muscle strength adequate and range of motion subtalar midtarsal joint mildly diminished. Patient is noted to have moderate edema +1 pitting in the ankle region of both feet with negative Homans sign noted and is noted to have mild diminishment of sharp dull and vibratory upon testing. 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

## 2014-08-07 NOTE — Progress Notes (Signed)
   Subjective:    Patient ID: William [name], male    DOB: 1934/05/24, 80 y.o.   MRN: [id number]  HPI Comments: "I need the toenails cut"  Patient states that he needs nails trimmed. No concerns with feet at this time. He is diabetic.     Review of Systems  HENT: Positive for hearing loss.   All other systems reviewed and are negative.      Objective:   Physical Exam        Assessment & Plan:

## 2014-09-10 DIAGNOSIS — Z23 Encounter for immunization: Secondary | ICD-10-CM | POA: Diagnosis not present

## 2014-10-30 ENCOUNTER — Ambulatory Visit (INDEPENDENT_AMBULATORY_CARE_PROVIDER_SITE_OTHER): Payer: Medicare Other | Admitting: Podiatry

## 2014-10-30 ENCOUNTER — Encounter: Payer: Self-pay | Admitting: Podiatry

## 2014-10-30 DIAGNOSIS — B351 Tinea unguium: Secondary | ICD-10-CM

## 2014-10-30 DIAGNOSIS — M79673 Pain in unspecified foot: Secondary | ICD-10-CM | POA: Diagnosis not present

## 2014-10-30 NOTE — Progress Notes (Signed)
Subjective:     Patient ID: William [name], male   DOB: 02/18/1934, 80 y.o.   MRN: [id number]  HPI patient presents with thick painful nailbeds 1-5 both feet that are impossible for him to cut and sore   Review of Systems     Objective:   Physical Exam Long-term diabetic with thick yellow brittle nailbeds 1-5 both feet that are painful    Assessment:     Mycotic nail infection with pain 1-5 both feet    Plan:     Debride painful nailbeds 1-5 both feet with no iatrogenic bleeding noted      

## 2014-10-30 NOTE — Progress Notes (Signed)
Subjective:     Patient ID: William [name], male   DOB: [birth year], 80 y.o.   MRN: [id number]  HPI patient presents with 16 years history of diabetes with nail disease that are thick painful and he cannot cut and also swelling in his ankles which is been present for a long period of time. States his A1c has been running well around 7.0   Review of Systems  All other systems reviewed and are negative.      Objective:   Physical Exam  Nursing note and vitals reviewed. Constitutional: He is oriented to person, place, and time.  Cardiovascular: Intact distal pulses.   Musculoskeletal: Normal range of motion.  Neurological: He is oriented to person, place, and time.  Skin: Skin is warm and dry.   neurovascular status found to be intact with muscle strength adequate and range of motion subtalar midtarsal joint mildly diminished. Patient is noted to have moderate edema +1 pitting in the ankle region of both feet with negative Homans sign noted and is noted to have mild diminishment of sharp dull and vibratory upon testing. 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

## 2015-01-01 DIAGNOSIS — R312 Other microscopic hematuria: Secondary | ICD-10-CM | POA: Diagnosis not present

## 2015-01-01 DIAGNOSIS — C61 Malignant neoplasm of prostate: Secondary | ICD-10-CM | POA: Diagnosis not present

## 2015-01-20 DIAGNOSIS — K4090 Unilateral inguinal hernia, without obstruction or gangrene, not specified as recurrent: Secondary | ICD-10-CM | POA: Diagnosis not present

## 2015-01-20 DIAGNOSIS — N200 Calculus of kidney: Secondary | ICD-10-CM | POA: Diagnosis not present

## 2015-01-20 DIAGNOSIS — R312 Other microscopic hematuria: Secondary | ICD-10-CM | POA: Diagnosis not present

## 2015-01-22 DIAGNOSIS — C61 Malignant neoplasm of prostate: Secondary | ICD-10-CM | POA: Diagnosis not present

## 2015-01-22 DIAGNOSIS — N200 Calculus of kidney: Secondary | ICD-10-CM | POA: Diagnosis not present

## 2015-01-22 DIAGNOSIS — R312 Other microscopic hematuria: Secondary | ICD-10-CM | POA: Diagnosis not present

## 2015-02-02 ENCOUNTER — Ambulatory Visit (INDEPENDENT_AMBULATORY_CARE_PROVIDER_SITE_OTHER): Payer: Medicare Other | Admitting: Podiatry

## 2015-02-02 DIAGNOSIS — M79673 Pain in unspecified foot: Secondary | ICD-10-CM

## 2015-02-02 DIAGNOSIS — B351 Tinea unguium: Secondary | ICD-10-CM | POA: Diagnosis not present

## 2015-02-02 NOTE — Progress Notes (Signed)
Subjective:     Patient ID: William [name], male   DOB: Jan 13, 1934, 81 y.o.   MRN: [id number]  HPI patient presents with thick painful nailbeds 1-5 both feet that are impossible for him to cut and sore   Review of Systems     Objective:   Physical Exam Long-term diabetic with thick yellow brittle nailbeds 1-5 both feet that are painful    Assessment:     Mycotic nail infection with pain 1-5 both feet    Plan:     Debride painful nailbeds 1-5 both feet with no iatrogenic bleeding noted

## 2015-02-03 DIAGNOSIS — I10 Essential (primary) hypertension: Secondary | ICD-10-CM | POA: Diagnosis not present

## 2015-02-03 DIAGNOSIS — N400 Enlarged prostate without lower urinary tract symptoms: Secondary | ICD-10-CM | POA: Diagnosis not present

## 2015-02-03 DIAGNOSIS — E118 Type 2 diabetes mellitus with unspecified complications: Secondary | ICD-10-CM | POA: Diagnosis not present

## 2015-02-09 DIAGNOSIS — R809 Proteinuria, unspecified: Secondary | ICD-10-CM | POA: Diagnosis not present

## 2015-02-09 DIAGNOSIS — I10 Essential (primary) hypertension: Secondary | ICD-10-CM | POA: Diagnosis not present

## 2015-02-09 DIAGNOSIS — E6609 Other obesity due to excess calories: Secondary | ICD-10-CM | POA: Diagnosis not present

## 2015-02-09 DIAGNOSIS — E1165 Type 2 diabetes mellitus with hyperglycemia: Secondary | ICD-10-CM | POA: Diagnosis not present

## 2015-02-24 DIAGNOSIS — K8020 Calculus of gallbladder without cholecystitis without obstruction: Secondary | ICD-10-CM | POA: Diagnosis not present

## 2015-05-06 DIAGNOSIS — Z471 Aftercare following joint replacement surgery: Secondary | ICD-10-CM | POA: Diagnosis not present

## 2015-05-06 DIAGNOSIS — Z96652 Presence of left artificial knee joint: Secondary | ICD-10-CM | POA: Diagnosis not present

## 2015-05-06 DIAGNOSIS — Z96651 Presence of right artificial knee joint: Secondary | ICD-10-CM | POA: Diagnosis not present

## 2015-05-25 ENCOUNTER — Ambulatory Visit (INDEPENDENT_AMBULATORY_CARE_PROVIDER_SITE_OTHER): Payer: Medicare Other | Admitting: Podiatry

## 2015-05-25 VITALS — BP 98/55 | HR 86 | Resp 14 | Wt 222.0 lb

## 2015-05-25 DIAGNOSIS — M79673 Pain in unspecified foot: Secondary | ICD-10-CM | POA: Diagnosis not present

## 2015-05-25 DIAGNOSIS — B351 Tinea unguium: Secondary | ICD-10-CM

## 2015-05-27 NOTE — Progress Notes (Signed)
Subjective:     Patient ID: William [name], male   DOB: Dec 14, 1933, 81 y.o.   MRN: [id number]  HPI patient presents with thick yellow brittle nailbeds 1-5 both feet that are painful and making it difficult to wear shoe gear comfortably   Review of Systems     Objective:   Physical Exam Neurovascular status intact with thick yellow brittle nailbeds 1-5 both feet    Assessment:     Mycotic nail infection with pain 1-5 both feet    Plan:     Debris painful nailbeds 1-5 both feet with no iatrogenic bleeding noted

## 2015-06-09 DIAGNOSIS — E1165 Type 2 diabetes mellitus with hyperglycemia: Secondary | ICD-10-CM | POA: Diagnosis not present

## 2015-06-09 DIAGNOSIS — Z125 Encounter for screening for malignant neoplasm of prostate: Secondary | ICD-10-CM | POA: Diagnosis not present

## 2015-06-16 DIAGNOSIS — I10 Essential (primary) hypertension: Secondary | ICD-10-CM | POA: Diagnosis not present

## 2015-06-16 DIAGNOSIS — R351 Nocturia: Secondary | ICD-10-CM | POA: Diagnosis not present

## 2015-06-16 DIAGNOSIS — C61 Malignant neoplasm of prostate: Secondary | ICD-10-CM | POA: Diagnosis not present

## 2015-06-16 DIAGNOSIS — E780 Pure hypercholesterolemia: Secondary | ICD-10-CM | POA: Diagnosis not present

## 2015-07-20 DIAGNOSIS — E780 Pure hypercholesterolemia: Secondary | ICD-10-CM | POA: Diagnosis not present

## 2015-07-20 DIAGNOSIS — I10 Essential (primary) hypertension: Secondary | ICD-10-CM | POA: Diagnosis not present

## 2015-07-20 DIAGNOSIS — E119 Type 2 diabetes mellitus without complications: Secondary | ICD-10-CM | POA: Diagnosis not present

## 2015-08-20 DIAGNOSIS — C61 Malignant neoplasm of prostate: Secondary | ICD-10-CM | POA: Diagnosis not present

## 2015-08-25 ENCOUNTER — Encounter: Payer: Self-pay | Admitting: Podiatry

## 2015-08-25 ENCOUNTER — Ambulatory Visit (INDEPENDENT_AMBULATORY_CARE_PROVIDER_SITE_OTHER): Payer: Medicare Other | Admitting: Podiatry

## 2015-08-25 DIAGNOSIS — M79673 Pain in unspecified foot: Secondary | ICD-10-CM

## 2015-08-25 DIAGNOSIS — B351 Tinea unguium: Secondary | ICD-10-CM | POA: Diagnosis not present

## 2015-08-25 NOTE — Progress Notes (Signed)
Patient ID: William [name], male   DOB: 05-18-1934, 81 y.o.   MRN: [id number] Complaint:  Visit Type: Patient returns to my office for continued preventative foot care services. Complaint: Patient states" my nails have grown long and thick and become painful to walk and wear shoes" . The patient presents for preventative foot care services. No changes to ROS.  Podiatric Exam: Vascular: dorsalis pedis and posterior tibial pulses are palpable bilateral. Capillary return is immediate. Temperature gradient is WNL. Skin turgor WNL  Sensorium: Normal Semmes Weinstein monofilament test. Normal tactile sensation bilaterally. Nail Exam: Pt has thick disfigured discolored nails with subungual debris noted bilateral entire nail hallux through fifth toenails Ulcer Exam: There is no evidence of ulcer or pre-ulcerative changes or infection. Orthopedic Exam: Muscle tone and strength are WNL. No limitations in general ROM. No crepitus or effusions noted. Foot type and digits show no abnormalities. Bony prominences are unremarkable. Skin: No Porokeratosis. No infection or ulcers  Diagnosis:  Onychomycosis, , Pain in right toe, pain in left toes  Treatment & Plan Procedures and Treatment: Consent by patient was obtained for treatment procedures. The patient understood the discussion of treatment and procedures well. All questions were answered thoroughly reviewed. Debridement of mycotic and hypertrophic toenails, 1 through 5 bilateral and clearing of subungual debris. No ulceration, no infection noted.  Return Visit-Office Procedure: Patient instructed to return to the office for a follow up visit 3 months for continued evaluation and treatment.

## 2015-09-24 ENCOUNTER — Encounter: Payer: Self-pay | Admitting: Gastroenterology

## 2015-09-24 DIAGNOSIS — Z23 Encounter for immunization: Secondary | ICD-10-CM | POA: Diagnosis not present

## 2015-11-10 DIAGNOSIS — I10 Essential (primary) hypertension: Secondary | ICD-10-CM | POA: Diagnosis not present

## 2015-11-10 DIAGNOSIS — E119 Type 2 diabetes mellitus without complications: Secondary | ICD-10-CM | POA: Diagnosis not present

## 2015-11-17 DIAGNOSIS — E119 Type 2 diabetes mellitus without complications: Secondary | ICD-10-CM | POA: Diagnosis not present

## 2015-11-17 DIAGNOSIS — I10 Essential (primary) hypertension: Secondary | ICD-10-CM | POA: Diagnosis not present

## 2015-11-24 ENCOUNTER — Ambulatory Visit: Payer: Medicare Other | Admitting: Podiatry

## 2015-12-07 DIAGNOSIS — E119 Type 2 diabetes mellitus without complications: Secondary | ICD-10-CM | POA: Diagnosis not present

## 2015-12-07 DIAGNOSIS — E7800 Pure hypercholesterolemia, unspecified: Secondary | ICD-10-CM | POA: Diagnosis not present

## 2015-12-07 DIAGNOSIS — I10 Essential (primary) hypertension: Secondary | ICD-10-CM | POA: Diagnosis not present

## 2015-12-15 ENCOUNTER — Ambulatory Visit: Payer: Medicare Other | Admitting: Podiatry

## 2015-12-25 ENCOUNTER — Encounter: Payer: Self-pay | Admitting: Podiatry

## 2015-12-25 ENCOUNTER — Ambulatory Visit (INDEPENDENT_AMBULATORY_CARE_PROVIDER_SITE_OTHER): Payer: Medicare Other | Admitting: Podiatry

## 2015-12-25 DIAGNOSIS — M79673 Pain in unspecified foot: Secondary | ICD-10-CM

## 2015-12-25 DIAGNOSIS — E119 Type 2 diabetes mellitus without complications: Secondary | ICD-10-CM | POA: Diagnosis not present

## 2015-12-25 DIAGNOSIS — B351 Tinea unguium: Secondary | ICD-10-CM | POA: Diagnosis not present

## 2015-12-25 NOTE — Progress Notes (Signed)
Patient ID: William [name], male   DOB: 05-19-1934, 82 y.o.   MRN: [id number] Complaint:  Visit Type: Patient returns to my office for continued preventative foot care services. Complaint: Patient states" my nails have grown long and thick and become painful to walk and wear shoes" Patient has been diagnosed with DM with no foot complications. The patient presents for preventative foot care services. No changes to ROS  Podiatric Exam: Vascular: dorsalis pedis and posterior tibial pulses are palpable bilateral. Capillary return is immediate. Temperature gradient is WNL. Skin turgor WNL  Sensorium: Normal Semmes Weinstein monofilament test. Normal tactile sensation bilaterally. Nail Exam: Pt has thick disfigured discolored nails with subungual debris noted bilateral entire nail hallux through fifth toenails Ulcer Exam: There is no evidence of ulcer or pre-ulcerative changes or infection. Orthopedic Exam: Muscle tone and strength are WNL. No limitations in general ROM. No crepitus or effusions noted. Foot type and digits show no abnormalities. Bony prominences are unremarkable. Skin: No Porokeratosis. No infection or ulcers  Diagnosis:  Onychomycosis, , Pain in right toe, pain in left toes  Treatment & Plan Procedures and Treatment: Consent by patient was obtained for treatment procedures. The patient understood the discussion of treatment and procedures well. All questions were answered thoroughly reviewed. Debridement of mycotic and hypertrophic toenails, 1 through 5 bilateral and clearing of subungual debris. No ulceration, no infection noted.  Return Visit-Office Procedure: Patient instructed to return to the office for a follow up visit 3 months for continued evaluation and treatment.    Gardiner Barefoot DPM

## 2015-12-31 DIAGNOSIS — H25043 Posterior subcapsular polar age-related cataract, bilateral: Secondary | ICD-10-CM | POA: Diagnosis not present

## 2015-12-31 DIAGNOSIS — E119 Type 2 diabetes mellitus without complications: Secondary | ICD-10-CM | POA: Diagnosis not present

## 2015-12-31 DIAGNOSIS — H25013 Cortical age-related cataract, bilateral: Secondary | ICD-10-CM | POA: Diagnosis not present

## 2015-12-31 DIAGNOSIS — H35031 Hypertensive retinopathy, right eye: Secondary | ICD-10-CM | POA: Diagnosis not present

## 2015-12-31 DIAGNOSIS — H35032 Hypertensive retinopathy, left eye: Secondary | ICD-10-CM | POA: Diagnosis not present

## 2015-12-31 DIAGNOSIS — H40013 Open angle with borderline findings, low risk, bilateral: Secondary | ICD-10-CM | POA: Diagnosis not present

## 2015-12-31 DIAGNOSIS — H2513 Age-related nuclear cataract, bilateral: Secondary | ICD-10-CM | POA: Diagnosis not present

## 2016-02-15 DIAGNOSIS — E1165 Type 2 diabetes mellitus with hyperglycemia: Secondary | ICD-10-CM | POA: Diagnosis not present

## 2016-02-15 DIAGNOSIS — I10 Essential (primary) hypertension: Secondary | ICD-10-CM | POA: Diagnosis not present

## 2016-02-22 DIAGNOSIS — N400 Enlarged prostate without lower urinary tract symptoms: Secondary | ICD-10-CM | POA: Diagnosis not present

## 2016-02-22 DIAGNOSIS — I10 Essential (primary) hypertension: Secondary | ICD-10-CM | POA: Diagnosis not present

## 2016-02-22 DIAGNOSIS — E119 Type 2 diabetes mellitus without complications: Secondary | ICD-10-CM | POA: Diagnosis not present

## 2016-02-22 DIAGNOSIS — E7800 Pure hypercholesterolemia, unspecified: Secondary | ICD-10-CM | POA: Diagnosis not present

## 2016-03-25 ENCOUNTER — Ambulatory Visit (INDEPENDENT_AMBULATORY_CARE_PROVIDER_SITE_OTHER): Payer: Medicare Other | Admitting: Podiatry

## 2016-03-25 ENCOUNTER — Encounter: Payer: Self-pay | Admitting: Podiatry

## 2016-03-25 DIAGNOSIS — M79673 Pain in unspecified foot: Secondary | ICD-10-CM

## 2016-03-25 DIAGNOSIS — B351 Tinea unguium: Secondary | ICD-10-CM

## 2016-03-25 DIAGNOSIS — E119 Type 2 diabetes mellitus without complications: Secondary | ICD-10-CM

## 2016-03-28 NOTE — Progress Notes (Signed)
Subjective:     Patient ID: William [name], male   DOB: December 24, 1933, 82 y.o.   MRN: [id number]  HPI patient presents with painful nail disease 1-5 both feet that are thick yellow and brittle   Review of Systems     Objective:   Physical Exam Neurovascular status intact with mycotic nail infection and pain 1-5 both feet    Assessment:     Mycotic nail infection with pain 1-5 both feet    Plan:     Debride painful nailbeds 1-5 both feet with no iatrogenic bleeding noted

## 2016-05-05 DIAGNOSIS — C61 Malignant neoplasm of prostate: Secondary | ICD-10-CM | POA: Diagnosis not present

## 2016-05-05 DIAGNOSIS — N200 Calculus of kidney: Secondary | ICD-10-CM | POA: Diagnosis not present

## 2016-05-05 DIAGNOSIS — R350 Frequency of micturition: Secondary | ICD-10-CM | POA: Diagnosis not present

## 2016-05-05 DIAGNOSIS — R3121 Asymptomatic microscopic hematuria: Secondary | ICD-10-CM | POA: Diagnosis not present

## 2016-05-06 DIAGNOSIS — H01003 Unspecified blepharitis right eye, unspecified eyelid: Secondary | ICD-10-CM | POA: Diagnosis not present

## 2016-05-06 DIAGNOSIS — H1851 Endothelial corneal dystrophy: Secondary | ICD-10-CM | POA: Diagnosis not present

## 2016-05-06 DIAGNOSIS — H40013 Open angle with borderline findings, low risk, bilateral: Secondary | ICD-10-CM | POA: Diagnosis not present

## 2016-05-19 DIAGNOSIS — I10 Essential (primary) hypertension: Secondary | ICD-10-CM | POA: Diagnosis not present

## 2016-05-19 DIAGNOSIS — E119 Type 2 diabetes mellitus without complications: Secondary | ICD-10-CM | POA: Diagnosis not present

## 2016-05-26 DIAGNOSIS — E119 Type 2 diabetes mellitus without complications: Secondary | ICD-10-CM | POA: Diagnosis not present

## 2016-05-26 DIAGNOSIS — R413 Other amnesia: Secondary | ICD-10-CM | POA: Diagnosis not present

## 2016-05-26 DIAGNOSIS — E785 Hyperlipidemia, unspecified: Secondary | ICD-10-CM | POA: Diagnosis not present

## 2016-06-17 ENCOUNTER — Ambulatory Visit (INDEPENDENT_AMBULATORY_CARE_PROVIDER_SITE_OTHER): Payer: Medicare Other | Admitting: Podiatry

## 2016-06-17 ENCOUNTER — Encounter: Payer: Self-pay | Admitting: Podiatry

## 2016-06-17 DIAGNOSIS — M79673 Pain in unspecified foot: Secondary | ICD-10-CM

## 2016-06-17 DIAGNOSIS — E119 Type 2 diabetes mellitus without complications: Secondary | ICD-10-CM | POA: Diagnosis not present

## 2016-06-17 DIAGNOSIS — B351 Tinea unguium: Secondary | ICD-10-CM

## 2016-06-17 NOTE — Progress Notes (Signed)
Patient ID: William [name], male   DOB: 07/27/1934, 82 y.o.   MRN: [id number] Complaint:  Visit Type: Patient returns to my office for continued preventative foot care services. Complaint: Patient states" my nails have grown long and thick and become painful to walk and wear shoes" Patient has been diagnosed with DM with no foot complications. The patient presents for preventative foot care services. No changes to ROS  Podiatric Exam: Vascular: dorsalis pedis and posterior tibial pulses are palpable bilateral. Capillary return is immediate. Temperature gradient is WNL. Skin turgor WNL  Sensorium: Normal Semmes Weinstein monofilament test. Normal tactile sensation bilaterally. Nail Exam: Pt has thick disfigured discolored nails with subungual debris noted bilateral entire nail hallux through fifth toenails Ulcer Exam: There is no evidence of ulcer or pre-ulcerative changes or infection. Orthopedic Exam: Muscle tone and strength are WNL. No limitations in general ROM. No crepitus or effusions noted. Foot type and digits show no abnormalities. Bony prominences are unremarkable. Skin: No Porokeratosis. No infection or ulcers  Diagnosis:  Onychomycosis, , Pain in right toe, pain in left toes  Treatment & Plan Procedures and Treatment: Consent by patient was obtained for treatment procedures. The patient understood the discussion of treatment and procedures well. All questions were answered thoroughly reviewed. Debridement of mycotic and hypertrophic toenails, 1 through 5 bilateral and clearing of subungual debris. No ulceration, no infection noted.  Return Visit-Office Procedure: Patient instructed to return to the office for a follow up visit 3 months for continued evaluation and treatment.    Tiffanny Lamarche DPM 

## 2016-08-17 DIAGNOSIS — I10 Essential (primary) hypertension: Secondary | ICD-10-CM | POA: Diagnosis not present

## 2016-08-17 DIAGNOSIS — R413 Other amnesia: Secondary | ICD-10-CM | POA: Diagnosis not present

## 2016-08-17 DIAGNOSIS — E119 Type 2 diabetes mellitus without complications: Secondary | ICD-10-CM | POA: Diagnosis not present

## 2016-08-17 DIAGNOSIS — E785 Hyperlipidemia, unspecified: Secondary | ICD-10-CM | POA: Diagnosis not present

## 2016-08-26 DIAGNOSIS — E785 Hyperlipidemia, unspecified: Secondary | ICD-10-CM | POA: Diagnosis not present

## 2016-08-26 DIAGNOSIS — E039 Hypothyroidism, unspecified: Secondary | ICD-10-CM | POA: Diagnosis not present

## 2016-08-26 DIAGNOSIS — Z23 Encounter for immunization: Secondary | ICD-10-CM | POA: Diagnosis not present

## 2016-08-26 DIAGNOSIS — E119 Type 2 diabetes mellitus without complications: Secondary | ICD-10-CM | POA: Diagnosis not present

## 2016-08-26 DIAGNOSIS — I10 Essential (primary) hypertension: Secondary | ICD-10-CM | POA: Diagnosis not present

## 2016-09-23 ENCOUNTER — Ambulatory Visit (INDEPENDENT_AMBULATORY_CARE_PROVIDER_SITE_OTHER): Payer: Medicare Other | Admitting: Podiatry

## 2016-09-23 DIAGNOSIS — E119 Type 2 diabetes mellitus without complications: Secondary | ICD-10-CM

## 2016-09-23 DIAGNOSIS — M79676 Pain in unspecified toe(s): Secondary | ICD-10-CM

## 2016-09-23 DIAGNOSIS — B351 Tinea unguium: Secondary | ICD-10-CM

## 2016-09-23 NOTE — Progress Notes (Signed)
Patient ID: William [name], male   DOB: 07/15/1934, 82 y.o.   MRN: [id number] Complaint:  Visit Type: Patient returns to my office for continued preventative foot care services. Complaint: Patient states" my nails have grown long and thick and become painful to walk and wear shoes" Patient has been diagnosed with DM with no foot complications. The patient presents for preventative foot care services. No changes to ROS  Podiatric Exam: Vascular: dorsalis pedis and posterior tibial pulses are palpable bilateral. Capillary return is immediate. Temperature gradient is WNL. Skin turgor WNL  Sensorium: Normal Semmes Weinstein monofilament test. Normal tactile sensation bilaterally. Nail Exam: Pt has thick disfigured discolored nails with subungual debris noted bilateral entire nail hallux through fifth toenails Ulcer Exam: There is no evidence of ulcer or pre-ulcerative changes or infection. Orthopedic Exam: Muscle tone and strength are WNL. No limitations in general ROM. No crepitus or effusions noted. Foot type and digits show no abnormalities. Bony prominences are unremarkable. Skin: No Porokeratosis. No infection or ulcers  Diagnosis:  Onychomycosis, , Pain in right toe, pain in left toes  Treatment & Plan Procedures and Treatment: Consent by patient was obtained for treatment procedures. The patient understood the discussion of treatment and procedures well. All questions were answered thoroughly reviewed. Debridement of mycotic and hypertrophic toenails, 1 through 5 bilateral and clearing of subungual debris. No ulceration, no infection noted.  Return Visit-Office Procedure: Patient instructed to return to the office for a follow up visit 3 months for continued evaluation and treatment.    Zaki Gertsch DPM 

## 2016-10-26 ENCOUNTER — Encounter (HOSPITAL_COMMUNITY): Payer: Self-pay

## 2016-10-26 ENCOUNTER — Emergency Department (HOSPITAL_COMMUNITY): Payer: Medicare Other

## 2016-10-26 ENCOUNTER — Inpatient Hospital Stay (HOSPITAL_COMMUNITY)
Admission: EM | Admit: 2016-10-26 | Discharge: 2016-10-28 | DRG: 065 | Disposition: A | Discharge status: Home / Self Care | Payer: Medicare Other | Attending: Internal Medicine | Admitting: Internal Medicine

## 2016-10-26 DIAGNOSIS — Z888 Allergy status to other drugs, medicaments and biological substances status: Secondary | ICD-10-CM

## 2016-10-26 DIAGNOSIS — Z9889 Other specified postprocedural states: Secondary | ICD-10-CM | POA: Diagnosis not present

## 2016-10-26 DIAGNOSIS — I6340 Cerebral infarction due to embolism of unspecified cerebral artery: Principal | ICD-10-CM | POA: Diagnosis present

## 2016-10-26 DIAGNOSIS — I4891 Unspecified atrial fibrillation: Secondary | ICD-10-CM | POA: Diagnosis present

## 2016-10-26 DIAGNOSIS — Z8546 Personal history of malignant neoplasm of prostate: Secondary | ICD-10-CM | POA: Diagnosis not present

## 2016-10-26 DIAGNOSIS — Z87891 Personal history of nicotine dependence: Secondary | ICD-10-CM

## 2016-10-26 DIAGNOSIS — Z9049 Acquired absence of other specified parts of digestive tract: Secondary | ICD-10-CM

## 2016-10-26 DIAGNOSIS — I4892 Unspecified atrial flutter: Secondary | ICD-10-CM | POA: Diagnosis present

## 2016-10-26 DIAGNOSIS — E785 Hyperlipidemia, unspecified: Secondary | ICD-10-CM | POA: Diagnosis present

## 2016-10-26 DIAGNOSIS — M1990 Unspecified osteoarthritis, unspecified site: Secondary | ICD-10-CM | POA: Diagnosis present

## 2016-10-26 DIAGNOSIS — I639 Cerebral infarction, unspecified: Secondary | ICD-10-CM | POA: Diagnosis present

## 2016-10-26 DIAGNOSIS — G459 Transient cerebral ischemic attack, unspecified: Secondary | ICD-10-CM | POA: Diagnosis not present

## 2016-10-26 DIAGNOSIS — I119 Hypertensive heart disease without heart failure: Secondary | ICD-10-CM | POA: Diagnosis present

## 2016-10-26 DIAGNOSIS — R29810 Facial weakness: Secondary | ICD-10-CM | POA: Diagnosis present

## 2016-10-26 DIAGNOSIS — Z79899 Other long term (current) drug therapy: Secondary | ICD-10-CM

## 2016-10-26 DIAGNOSIS — Z794 Long term (current) use of insulin: Secondary | ICD-10-CM | POA: Diagnosis not present

## 2016-10-26 DIAGNOSIS — Z800 Family history of malignant neoplasm of digestive organs: Secondary | ICD-10-CM | POA: Diagnosis not present

## 2016-10-26 DIAGNOSIS — Z7982 Long term (current) use of aspirin: Secondary | ICD-10-CM

## 2016-10-26 DIAGNOSIS — I517 Cardiomegaly: Secondary | ICD-10-CM | POA: Diagnosis not present

## 2016-10-26 DIAGNOSIS — E119 Type 2 diabetes mellitus without complications: Secondary | ICD-10-CM | POA: Diagnosis present

## 2016-10-26 DIAGNOSIS — I10 Essential (primary) hypertension: Secondary | ICD-10-CM | POA: Diagnosis not present

## 2016-10-26 DIAGNOSIS — Z96653 Presence of artificial knee joint, bilateral: Secondary | ICD-10-CM | POA: Diagnosis present

## 2016-10-26 DIAGNOSIS — I480 Paroxysmal atrial fibrillation: Secondary | ICD-10-CM | POA: Diagnosis not present

## 2016-10-26 DIAGNOSIS — E669 Obesity, unspecified: Secondary | ICD-10-CM | POA: Diagnosis present

## 2016-10-26 DIAGNOSIS — Z6830 Body mass index (BMI) 30.0-30.9, adult: Secondary | ICD-10-CM | POA: Diagnosis not present

## 2016-10-26 DIAGNOSIS — Z923 Personal history of irradiation: Secondary | ICD-10-CM

## 2016-10-26 DIAGNOSIS — I6789 Other cerebrovascular disease: Secondary | ICD-10-CM | POA: Diagnosis not present

## 2016-10-26 DIAGNOSIS — G4730 Sleep apnea, unspecified: Secondary | ICD-10-CM | POA: Diagnosis present

## 2016-10-26 DIAGNOSIS — I6300 Cerebral infarction due to thrombosis of unspecified precerebral artery: Secondary | ICD-10-CM | POA: Diagnosis not present

## 2016-10-26 DIAGNOSIS — I482 Chronic atrial fibrillation: Secondary | ICD-10-CM | POA: Diagnosis not present

## 2016-10-26 LAB — I-STAT CHEM 8, ED
BUN: 32 mg/dL — ABNORMAL HIGH (ref 6–20)
Calcium, Ion: 1.21 mmol/L (ref 1.15–1.40)
Chloride: 111 mmol/L (ref 101–111)
Creatinine, Ser: 1 mg/dL (ref 0.61–1.24)
Glucose, Bld: 157 mg/dL — ABNORMAL HIGH (ref 65–99)
HEMATOCRIT: 46 % (ref 39.0–52.0)
HEMOGLOBIN: 15.6 g/dL (ref 13.0–17.0)
POTASSIUM: 4.3 mmol/L (ref 3.5–5.1)
SODIUM: 145 mmol/L (ref 135–145)
TCO2: 22 mmol/L (ref 0–100)

## 2016-10-26 LAB — DIFFERENTIAL
BASOS ABS: 0 10*3/uL (ref 0.0–0.1)
Basophils Relative: 0 %
Eosinophils Absolute: 0.3 10*3/uL (ref 0.0–0.7)
Eosinophils Relative: 2 %
LYMPHS ABS: 1.1 10*3/uL (ref 0.7–4.0)
Lymphocytes Relative: 11 %
Monocytes Absolute: 1 10*3/uL (ref 0.1–1.0)
Monocytes Relative: 10 %
NEUTROS ABS: 8 10*3/uL — AB (ref 1.7–7.7)
NEUTROS PCT: 77 %

## 2016-10-26 LAB — COMPREHENSIVE METABOLIC PANEL
ALT: 24 U/L (ref 17–63)
AST: 26 U/L (ref 15–41)
Albumin: 4.1 g/dL (ref 3.5–5.0)
Alkaline Phosphatase: 69 U/L (ref 38–126)
Anion gap: 8 (ref 5–15)
BILIRUBIN TOTAL: 1.7 mg/dL — AB (ref 0.3–1.2)
BUN: 34 mg/dL — AB (ref 6–20)
CO2: 22 mmol/L (ref 22–32)
CREATININE: 1.06 mg/dL (ref 0.61–1.24)
Calcium: 9.6 mg/dL (ref 8.9–10.3)
Chloride: 111 mmol/L (ref 101–111)
GFR calc Af Amer: >60 mL/min (ref >60)
GFR calc non Af Amer: >60 mL/min (ref >60)
GLUCOSE: 162 mg/dL — AB (ref 65–99)
Potassium: 4.3 mmol/L (ref 3.5–5.1)
Sodium: 141 mmol/L (ref 135–145)
Total Protein: 7.2 g/dL (ref 6.5–8.1)

## 2016-10-26 LAB — CBG MONITORING, ED
GLUCOSE-CAPILLARY: 82 mg/dL (ref 65–99)
Glucose-Capillary: 163 mg/dL — ABNORMAL HIGH (ref 65–99)
Glucose-Capillary: 104 mg/dL — ABNORMAL HIGH (ref 65–99)
Glucose-Capillary: 199 mg/dL — ABNORMAL HIGH (ref 65–99)

## 2016-10-26 LAB — CBC
HCT: 45.4 % (ref 39.0–52.0)
HEMOGLOBIN: 15.2 g/dL (ref 13.0–17.0)
MCH: 31.8 pg (ref 26.0–34.0)
MCHC: 33.5 g/dL (ref 30.0–36.0)
MCV: 95 fL (ref 78.0–100.0)
Platelets: 196 10*3/uL (ref 150–400)
RBC: 4.78 MIL/uL (ref 4.22–5.81)
RDW: 13.7 % (ref 11.5–15.5)
WBC: 10.4 10*3/uL (ref 4.0–10.5)

## 2016-10-26 LAB — I-STAT TROPONIN, ED: TROPONIN I, POC: 0.04 ng/mL (ref 0.00–0.08)

## 2016-10-26 LAB — PROTIME-INR
INR: 1.02
Prothrombin Time: 13.4 seconds (ref 11.4–15.2)

## 2016-10-26 LAB — APTT: APTT: 29 s (ref 24–36)

## 2016-10-26 MED ORDER — INSULIN ASPART 100 UNIT/ML ~~LOC~~ SOLN
0.0000 [IU] | Freq: Three times a day (TID) | SUBCUTANEOUS | Status: DC
  Administered 2016-10-27: 2 [IU] via SUBCUTANEOUS
  Administered 2016-10-27: 1 [IU] via SUBCUTANEOUS
  Administered 2016-10-27: 2 [IU] via SUBCUTANEOUS
  Administered 2016-10-28: 1 [IU] via SUBCUTANEOUS
  Administered 2016-10-28: 3 [IU] via SUBCUTANEOUS

## 2016-10-26 NOTE — ED Notes (Addendum)
Per Zammitt MD, Pt is allowed to eat and may take home insulin.  Pt provided [place] sandwich and orange juice.

## 2016-10-26 NOTE — ED Notes (Signed)
Waiting for EMTALA to be completed to call report/ transfer pt. MD paged.

## 2016-10-26 NOTE — ED Notes (Signed)
Pt meal tray given. Pt passed swallow screen

## 2016-10-26 NOTE — H&P (Signed)
History and Physical    William [name] [id number] DOB: [birth year] DOA: 10/26/2016  Referring MD/NP/PA: Dr. Roderic Palau  PCP: Jani Gravel, MD   Patient coming from: Home  Chief Complaint: slurred speech and right facial droop   HPI: William [name] is a 82 y.o. male with HTN, HLD, DM on insulin, presented to [facility] ED for evaluation of sudden onset of slurred speech that occurred earlier in the day prior to this admission and per wife she has also noted right facial droop. Pt reports hx of stroke and was worried about it today and that is why he decided to come to the ED. Wife says pt never reported or demonstrated any weakness, no LOC, no gait problems. Symptoms resolved in about 15 minutes.   In ED. Pt is hemodynamically stable, VSS, pt's symptoms have resolved and [facility] asked to admit for evaluation. ED doctor spoke with neurologist on call who recommended transfer to [facility] for further management.   Review of Systems:  Constitutional: Negative for fever, chills, diaphoresis, activity change, appetite change and fatigue.  HENT: Negative for ear pain, nosebleeds, congestion, facial swelling, rhinorrhea, neck pain, neck stiffness and ear discharge.   Eyes: Negative for pain, discharge, redness, itching and visual disturbance.  Respiratory: Negative for cough, choking, chest tightness, shortness of breath, wheezing and stridor.   Cardiovascular: Negative for chest pain, palpitations and leg swelling.  Gastrointestinal: Negative for abdominal distention.  Genitourinary: Negative for dysuria, urgency, frequency, hematuria, flank pain, decreased urine volume, difficulty urinating and dyspareunia.  Musculoskeletal: Negative for back pain, joint swelling, arthralgias and gait problem.  Neurological: Per HPI  Hematological: Negative for adenopathy. Does not bruise/bleed easily.  Psychiatric/Behavioral: Negative for hallucinations, behavioral problems, confusion, dysphoric mood, decreased concentration and  agitation.   Past Medical History:  Diagnosis Date  . Arthritis   . Diabetes mellitus without complication ([place])   . Hypercholesteremia   . Hypertension   . Prostate cancer [facility])    with radiation treatment  . Sleep apnea 2/14   "mild per patient" hasnt gotten CPAP machine    Past Surgical History:  Procedure Laterality Date  . ADENOIDECTOMY    . FLEXIBLE SIGMOIDOSCOPY N/A 09/12/2013   Procedure: FLEXIBLE SIGMOIDOSCOPY;  Surgeon: Milus Banister, MD;  Location: [facility] ENDOSCOPY;  Service: Endoscopy;  Laterality: N/A;  . HERNIA REPAIR    . HOT HEMOSTASIS N/A 09/12/2013   Procedure: HOT HEMOSTASIS (ARGON PLASMA COAGULATION/BICAP);  Surgeon: Milus Banister, MD;  Location: [facility];  Service: Endoscopy;  Laterality: N/A;  . LIPOMA EXCISION    . OTHER SURGICAL HISTORY     s/p prostate radiation  . TONSILLECTOMY    . TOTAL KNEE ARTHROPLASTY Left 03/25/2014   Procedure: LEFT TOTAL KNEE ARTHROPLASTY;  Surgeon: [name], MD;  Location: [facility] ORS;  Service: Orthopedics;  Laterality: Left;  . TOTAL KNEE ARTHROPLASTY Right 05/06/2014   Procedure: RIGHT TOTAL KNEE ARTHROPLASTY;  Surgeon: [name], MD;  Location: [facility] ORS;  Service: Orthopedics;  Laterality: Right;   Social Hx:  reports that he has quit smoking. He started smoking about 33 years ago. He has never used smokeless tobacco. He reports that he drinks alcohol. He reports that he does not use drugs.  Allergies  Allergen Reactions  . Glyburide Other (See Comments)    Hypoglycemia     Family History  Problem Relation Age of Onset  . Colon cancer Father   . Esophageal cancer Neg Hx   . Rectal cancer Neg Hx   .  Stomach cancer Neg Hx     Prior to Admission medications   Medication Sig Start Date End Date Taking? Authorizing Provider  amoxicillin (AMOXIL) 500 MG capsule Take 2,000 mg by mouth as directed. 1 hour prior to dental procedures. 12/21/15  Yes Historical Provider, MD  aspirin 81 MG tablet Take 81 mg by mouth  daily.   Yes Historical Provider, MD  co-enzyme Q-10 30 MG capsule Take 300 mg by mouth daily.   Yes Historical Provider, MD  glucosamine-chondroitin 500-400 MG tablet Take 1 tablet by mouth 2 (two) times daily.   Yes Historical Provider, MD  HUMALOG KWIKPEN 100 UNIT/ML KiwkPen Inject 2-8 Units into the skin 3 (three) times daily.  12/11/15  Yes Historical Provider, MD  LEVEMIR FLEXTOUCH 100 UNIT/ML Pen Inject 16 Units into the skin daily at 10 pm. As directed.  08/08/16  Yes Historical Provider, MD  lisinopril-hydrochlorothiazide (PRINZIDE,ZESTORETIC) 10-12.5 MG per tablet Take 1 tablet by mouth every morning.    Yes Historical Provider, MD  metFORMIN (GLUCOPHAGE) 1000 MG tablet Take 1,000 mg by mouth 2 (two) times daily with a meal.   Yes Historical Provider, MD  ONGLYZA 5 MG TABS tablet Take 5 mg by mouth at bedtime.  05/11/15  Yes Historical Provider, MD  pravastatin (PRAVACHOL) 40 MG tablet Take 40 mg by mouth daily with supper.    Yes Historical Provider, MD    Physical Exam: Vitals:   10/26/16 1324 10/26/16 1330 10/26/16 1410 10/26/16 1441  BP: 112/57 159/75 159/79 139/85  Pulse: 61 (!) 53 82 65  Resp: 16 20 20 17   Temp:      TempSrc:      SpO2: 95% 98% 91% 97%  Weight:      Height:        Constitutional: NAD, calm, comfortable Vitals:   10/26/16 1324 10/26/16 1330 10/26/16 1410 10/26/16 1441  BP: 112/57 159/75 159/79 139/85  Pulse: 61 (!) 53 82 65  Resp: 16 20 20 17   Temp:      TempSrc:      SpO2: 95% 98% 91% 97%  Weight:      Height:       Eyes: PERRL, lids and conjunctivae normal ENMT: Mucous membranes are moist. Posterior pharynx clear of any exudate or lesions.Normal dentition.  Neck: normal, supple, no masses, no thyromegaly Respiratory: clear to auscultation bilaterally, no wheezing, no crackles. Normal respiratory effort. No accessory muscle use.  Cardiovascular: Regular rate and rhythm, no rubs / gallops. No extremity edema. 2+ pedal pulses. No carotid bruits.    Abdomen: no tenderness, no masses palpated. No hepatosplenomegaly. Bowel sounds positive.  Musculoskeletal: no clubbing / cyanosis. No joint deformity upper and lower extremities. Good ROM, no contractures. Normal muscle tone.  Skin: no rashes, lesions, ulcers. No induration Neurologic: CN 2-12 grossly intact. Sensation intact, DTR normal. Strength 5/5 in all 4.  Psychiatric: Normal judgment and insight. Alert and oriented x 3. Normal mood.   Labs on Admission: I have personally reviewed following labs and imaging studies  CBC:  Recent Labs Lab 10/26/16 1033 10/26/16 1044  WBC 10.4  --   NEUTROABS 8.0*  --   HGB 15.2 15.6  HCT 45.4 46.0  MCV 95.0  --   PLT 196  --    Basic Metabolic Panel:  Recent Labs Lab 10/26/16 1033 10/26/16 1044  NA 141 145  K 4.3 4.3  CL 111 111  CO2 22  --   GLUCOSE 162* 157*  BUN 34* 32*  CREATININE 1.06 1.00  CALCIUM 9.6  --    Liver Function Tests:  Recent Labs Lab 10/26/16 1033  AST 26  ALT 24  ALKPHOS 69  BILITOT 1.7*  PROT 7.2  ALBUMIN 4.1   Coagulation Profile:  Recent Labs Lab 10/26/16 1033  INR 1.02   CBG:  Recent Labs Lab 10/26/16 1031 10/26/16 1334  GLUCAP 163* 82   Urine analysis:    Component Value Date/Time   COLORURINE YELLOW 04/29/2014 [place] 04/29/2014 1033   LABSPEC 1.016 04/29/2014 1033   PHURINE 5.5 04/29/2014 1033   GLUCOSEU NEGATIVE 04/29/2014 1033   HGBUR NEGATIVE 04/29/2014 1033   BILIRUBINUR NEGATIVE 04/29/2014 1033   KETONESUR NEGATIVE 04/29/2014 1033   PROTEINUR NEGATIVE 04/29/2014 1033   UROBILINOGEN 1.0 04/29/2014 1033   NITRITE NEGATIVE 04/29/2014 1033   LEUKOCYTESUR NEGATIVE 04/29/2014 1033   Radiological Exams on Admission: Ct Head Code Stroke W/o Cm  Result Date: 10/26/2016 CLINICAL DATA:  Code stroke. Slurred speech and right-sided facial droop beginning 2 hours ago. Symptoms have resolved. Code stroke. EXAM: CT HEAD WITHOUT CONTRAST TECHNIQUE: Contiguous  axial images were obtained from the base of the skull through the vertex without intravenous contrast. COMPARISON:  None. FINDINGS: Brain: Generalized brain atrophy. No abnormality affecting the brainstem or cerebellum. Cerebral hemispheres show what is probably an old left frontal cortical and subcortical infarction. Punctate hyperdensity in a superficial vessel in that region could be secondary to an old calcified embolus. No sign of acute infarction, mass lesion, hemorrhage, hydrocephalus or extra-axial collection. Vascular: There is atherosclerotic calcification of the major vessels at the base of the brain. Skull: Negative Sinuses/Orbits: Clear/normal Other: None significant ASPECTS ([place] Stroke Program Early CT Score) - Ganglionic level infarction (caudate, lentiform nuclei, internal capsule, insula, M1-M3 cortex): 7 - Supraganglionic infarction (M4-M6 cortex): 3 Total score (0-10 with 10 being normal): 10 IMPRESSION: 1. No acute finding. No intracranial hemorrhage. 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 Punctate hyperdensity in that region could be secondary to an old calcified embolus. 2. ASPECTS is 10. These results were called by telephone at the time of interpretation on 10/26/2016 at 11:46 am to Dr. Milton Ferguson , who verbally acknowledged these results. Electronically Signed   By: Nelson Chimes M.D.   On: 10/26/2016 11:48    EKG: pending   Assessment/Plan Active Problems: Slurred speech and right facial droop - highly concerning for TIA in pt with known risk factors HTN, HLD, DM - symptoms now resolved - admit to [facility] for full stroke work up - noted on tele a-fib so ? Embolic event - need MRI brain  - ECHO and carotid dopplers - also check HgA1C, lipid panel - SLP/PT/OT - consult to neurology   A-fib - with HR in 50's - 12 lead EKG requested - CHADS2 5 (HTN, age, DM, hx of stroke) - needs likely AC but will discuss with neurology first  -  keep on tele   HTN - continue home medical regimen  HLD - check lipid panel  DM type II with long term use insulin  - continue insulin and add SSI  Obesity   - Body mass index is 30.87 kg/m.   DVT prophylaxis: Lovenox SQ Code Status: Full Family Communication: Pt and wife updated at bedside Disposition Plan: Home Consults called: Neurology  Admission status: Inpatient   Faye Ramsay MD [facility] Pager [phone]  If 7PM-7AM, please contact night-coverage [email or web address] Password [facility]  10/26/2016, 3:19  PM

## 2016-10-26 NOTE — ED Notes (Signed)
MD to be paged to complete EMTALA for transfer now that bed is available.

## 2016-10-26 NOTE — ED Triage Notes (Signed)
Pt c/o slurred speech and R side facial droop.  Symptoms started around 0945 and seem to have resolved.  Symptoms lasted around 40 minutes.  Denies pain.  Denies weakness.  Hx of HTN and DM.

## 2016-10-26 NOTE — ED Provider Notes (Signed)
[facility] DEPT Provider Note   CSN: [id number] Arrival date & time: 10/26/16  1012     History   Chief Complaint Chief Complaint  Patient presents with  . Aphasia  . Facial Droop    HPI ARIHANT BYRDSONG is a 82 y.o. male.  The patient and his wife stated that he started with facial drooping at around 945. They came to the emergency department shortly after that. I saw the patient around 10:15 and his neurological exam was normal at that point   The history is provided by the patient. No language interpreter was used.  Altered Mental Status   This is a new (Patient had facial drooping for approximately 30 minutes today) problem. The current episode started 3 to 5 hours ago. The problem has been resolved. Pertinent negatives include no confusion, no seizures and no hallucinations. Risk factors: Hypertension,  elevared cholesterol. His past medical history does not include seizures.    Past Medical History:  Diagnosis Date  . Arthritis   . Diabetes mellitus without complication ([place])   . Hypercholesteremia   . Hypertension   . Prostate cancer [facility])    with radiation treatment  . Sleep apnea 2/14   "mild per patient" hasnt gotten CPAP machine    Patient Active Problem List   Diagnosis Date Noted  . Obese 05/07/2014  . Expected blood loss anemia 05/07/2014  . S/P right TKA 05/06/2014  . DJD (degenerative joint disease) of knee 03/25/2014  . Preop cardiovascular exam 03/19/2014  . Abnormal ECG 03/19/2014  . Hypertension   . Diabetes mellitus without complication ([place])   . Hypercholesteremia   . Arthritis   . Prostate cancer ([place])   . Sleep apnea     Past Surgical History:  Procedure Laterality Date  . ADENOIDECTOMY    . FLEXIBLE SIGMOIDOSCOPY N/A 09/12/2013   Procedure: FLEXIBLE SIGMOIDOSCOPY;  Surgeon: Milus Banister, MD;  Location: [facility] ENDOSCOPY;  Service: Endoscopy;  Laterality: N/A;  . HERNIA REPAIR    . HOT HEMOSTASIS N/A 09/12/2013   Procedure: HOT  HEMOSTASIS (ARGON PLASMA COAGULATION/BICAP);  Surgeon: Milus Banister, MD;  Location: [facility];  Service: Endoscopy;  Laterality: N/A;  . LIPOMA EXCISION    . OTHER SURGICAL HISTORY     s/p prostate radiation  . TONSILLECTOMY    . TOTAL KNEE ARTHROPLASTY Left 03/25/2014   Procedure: LEFT TOTAL KNEE ARTHROPLASTY;  Surgeon: [name], MD;  Location: [facility] ORS;  Service: Orthopedics;  Laterality: Left;  . TOTAL KNEE ARTHROPLASTY Right 05/06/2014   Procedure: RIGHT TOTAL KNEE ARTHROPLASTY;  Surgeon: [name], MD;  Location: [facility] ORS;  Service: Orthopedics;  Laterality: Right;       Home Medications    Prior to Admission medications   Medication Sig Start Date End Date Taking? Authorizing Provider  amoxicillin (AMOXIL) 500 MG capsule Take 2,000 mg by mouth as directed. 1 hour prior to dental procedures. 12/21/15  Yes Historical Provider, MD  aspirin 81 MG tablet Take 81 mg by mouth daily.   Yes Historical Provider, MD  co-enzyme Q-10 30 MG capsule Take 300 mg by mouth daily.   Yes Historical Provider, MD  glucosamine-chondroitin 500-400 MG tablet Take 1 tablet by mouth 2 (two) times daily.   Yes Historical Provider, MD  HUMALOG KWIKPEN 100 UNIT/ML KiwkPen Inject 2-8 Units into the skin 3 (three) times daily.  12/11/15  Yes Historical Provider, MD  LEVEMIR FLEXTOUCH 100 UNIT/ML Pen Inject 16 Units into the skin daily at  10 pm. As directed.  08/08/16  Yes Historical Provider, MD  lisinopril-hydrochlorothiazide (PRINZIDE,ZESTORETIC) 10-12.5 MG per tablet Take 1 tablet by mouth every morning.    Yes Historical Provider, MD  metFORMIN (GLUCOPHAGE) 1000 MG tablet Take 1,000 mg by mouth 2 (two) times daily with a meal.   Yes Historical Provider, MD  ONGLYZA 5 MG TABS tablet Take 5 mg by mouth at bedtime.  05/11/15  Yes Historical Provider, MD  pravastatin (PRAVACHOL) 40 MG tablet Take 40 mg by mouth daily with supper.    Yes Historical Provider, MD    Family History Family History  Problem  Relation Age of Onset  . Colon cancer Father   . Esophageal cancer Neg Hx   . Rectal cancer Neg Hx   . Stomach cancer Neg Hx     Social History Social History  Substance Use Topics  . Smoking status: Former Smoker    Start date: 11/28/1982  . Smokeless tobacco: Never Used  . Alcohol use Yes     Comment: occ     Allergies   Glyburide   Review of Systems Review of Systems  Constitutional: Negative for appetite change and fatigue.  HENT: Negative for congestion, ear discharge and sinus pressure.   Eyes: Negative for discharge.  Respiratory: Negative for cough.   Cardiovascular: Negative for chest pain.  Gastrointestinal: Negative for abdominal pain and diarrhea.  Genitourinary: Negative for frequency and hematuria.  Musculoskeletal: Negative for back pain.  Skin: Negative for rash.  Neurological: Negative for seizures and headaches.       Facial drooping for 30 min  Psychiatric/Behavioral: Negative for confusion and hallucinations.     Physical Exam Updated Vital Signs BP 139/85   Pulse 65   Temp (!) 96.8 F (36 C)   Resp 17   Ht 6\' 1"  (1.854 m)   Wt 234 lb (106.1 kg)   SpO2 97%   BMI 30.87 kg/m   Physical Exam  Constitutional: He is oriented to person, place, and time. He appears well-developed.  HENT:  Head: Normocephalic.  Eyes: Conjunctivae and EOM are normal. No scleral icterus.  Neck: Neck supple. No thyromegaly present.  Cardiovascular: Exam reveals no gallop and no friction rub.   No murmur heard. Irregular rhythm  Pulmonary/Chest: No stridor. He has no wheezes. He has no rales. He exhibits no tenderness.  Abdominal: He exhibits no distension. There is no tenderness. There is no rebound.  Musculoskeletal: Normal range of motion. He exhibits no edema.  Lymphadenopathy:    He has no cervical adenopathy.  Neurological: He is oriented to person, place, and time. He exhibits normal muscle tone. Coordination normal.  Skin: No rash noted. No erythema.    Psychiatric: He has a normal mood and affect. His behavior is normal.     ED Treatments / Results  Labs (all labs ordered are listed, but only abnormal results are displayed) Labs Reviewed  DIFFERENTIAL - Abnormal; Notable for the following:       Result Value   Neutro Abs 8.0 (*)    All other components within normal limits  COMPREHENSIVE METABOLIC PANEL - Abnormal; Notable for the following:    Glucose, Bld 162 (*)    BUN 34 (*)    Total Bilirubin 1.7 (*)    All other components within normal limits  CBG MONITORING, ED - Abnormal; Notable for the following:    Glucose-Capillary 163 (*)    All other components within normal limits  I-STAT CHEM 8, ED -  Abnormal; Notable for the following:    BUN 32 (*)    Glucose, Bld 157 (*)    All other components within normal limits  PROTIME-INR  APTT  CBC  I-STAT TROPOININ, ED  CBG MONITORING, ED    EKG  EKG Interpretation  Date/Time:  Wednesday October 26 2016 10:27:29 EST Ventricular Rate:  66 PR Interval:    QRS Duration: 84 QT Interval:  437 QTC Calculation: 458 R Axis:   0 Text Interpretation:  Atrial fibrillation Inferior infarct, old Anterior infarct, old Baseline wander in lead(s) V2 Confirmed by Tayten Heber  MD, Anaia Frith [id number]) on 10/26/2016 1:30:41 PM       Radiology Ct Head Code Stroke W/o Cm  Result Date: 10/26/2016 CLINICAL DATA:  Code stroke. Slurred speech and right-sided facial droop beginning 2 hours ago. Symptoms have resolved. Code stroke. EXAM: CT HEAD WITHOUT CONTRAST TECHNIQUE: Contiguous axial images were obtained from the base of the skull through the vertex without intravenous contrast. COMPARISON:  None. FINDINGS: Brain: Generalized brain atrophy. No abnormality affecting the brainstem or cerebellum. Cerebral hemispheres show what is probably an old left frontal cortical and subcortical infarction. Punctate hyperdensity in a superficial vessel in that region could be secondary to an old calcified embolus.  No sign of acute infarction, mass lesion, hemorrhage, hydrocephalus or extra-axial collection. Vascular: There is atherosclerotic calcification of the major vessels at the base of the brain. Skull: Negative Sinuses/Orbits: Clear/normal Other: None significant ASPECTS ([place] Stroke Program Early CT Score) - Ganglionic level infarction (caudate, lentiform nuclei, internal capsule, insula, M1-M3 cortex): 7 - Supraganglionic infarction (M4-M6 cortex): 3 Total score (0-10 with 10 being normal): 10 IMPRESSION: 1. No acute finding. No intracranial hemorrhage. 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 Punctate hyperdensity in that region could be secondary to an old calcified embolus. 2. ASPECTS is 10. These results were called by telephone at the time of interpretation on 10/26/2016 at 11:46 am to Dr. Milton Ferguson , who verbally acknowledged these results. Electronically Signed   By: Nelson Chimes M.D.   On: 10/26/2016 11:48    Procedures Procedures (including critical care time)  Medications Ordered in ED Medications - No data to display   Initial Impression / Assessment and Plan / ED Course  I have reviewed the triage vital signs and the nursing notes.  Pertinent labs & imaging results that were available during my care of the patient were reviewed by me and considered in my medical decision making (see chart for details).  Clinical Course    Patient with new onset atrial fib and a TIA. I spoke with neurology who wanted medicine to admit the patient over at [facility] and neurology will consult   Final Clinical Impressions(s) / ED Diagnoses   Final diagnoses:  None    New Prescriptions New Prescriptions   No medications on file     Milton Ferguson, MD 10/26/16 1525

## 2016-10-27 ENCOUNTER — Inpatient Hospital Stay (HOSPITAL_COMMUNITY): Payer: Medicare Other

## 2016-10-27 DIAGNOSIS — I480 Paroxysmal atrial fibrillation: Secondary | ICD-10-CM

## 2016-10-27 DIAGNOSIS — E785 Hyperlipidemia, unspecified: Secondary | ICD-10-CM

## 2016-10-27 DIAGNOSIS — I6300 Cerebral infarction due to thrombosis of unspecified precerebral artery: Secondary | ICD-10-CM

## 2016-10-27 DIAGNOSIS — I10 Essential (primary) hypertension: Secondary | ICD-10-CM

## 2016-10-27 DIAGNOSIS — G459 Transient cerebral ischemic attack, unspecified: Secondary | ICD-10-CM

## 2016-10-27 LAB — LIPID PANEL
CHOL/HDL RATIO: 4.5 ratio
CHOLESTEROL: 125 mg/dL (ref 0–200)
HDL: 28 mg/dL — AB (ref >40)
LDL Cholesterol: 72 mg/dL (ref 0–99)
Triglycerides: 124 mg/dL (ref <150)
VLDL: 25 mg/dL (ref 0–40)

## 2016-10-27 LAB — CBC
HCT: 45.7 % (ref 39.0–52.0)
HEMOGLOBIN: 15.7 g/dL (ref 13.0–17.0)
MCH: 32.4 pg (ref 26.0–34.0)
MCHC: 34.4 g/dL (ref 30.0–36.0)
MCV: 94.2 fL (ref 78.0–100.0)
PLATELETS: 209 10*3/uL (ref 150–400)
RBC: 4.85 MIL/uL (ref 4.22–5.81)
RDW: 13.5 % (ref 11.5–15.5)
WBC: 10.7 10*3/uL — AB (ref 4.0–10.5)

## 2016-10-27 LAB — GLUCOSE, CAPILLARY
GLUCOSE-CAPILLARY: 124 mg/dL — AB (ref 65–99)
GLUCOSE-CAPILLARY: 129 mg/dL — AB (ref 65–99)
GLUCOSE-CAPILLARY: 139 mg/dL — AB (ref 65–99)
GLUCOSE-CAPILLARY: 186 mg/dL — AB (ref 65–99)
Glucose-Capillary: 182 mg/dL — ABNORMAL HIGH (ref 65–99)

## 2016-10-27 LAB — CREATININE, SERUM
Creatinine, Ser: 0.95 mg/dL (ref 0.61–1.24)
GFR calc Af Amer: >60 mL/min (ref >60)
GFR calc non Af Amer: >60 mL/min (ref >60)

## 2016-10-27 LAB — HEMOGLOBIN A1C
Hgb A1c MFr Bld: 6.8 % — ABNORMAL HIGH (ref 4.8–5.6)
Mean Plasma Glucose: 148 mg/dL

## 2016-10-27 MED ORDER — HYDROCHLOROTHIAZIDE 12.5 MG PO CAPS
12.5000 mg | ORAL_CAPSULE | Freq: Every day | ORAL | Status: DC
  Administered 2016-10-27 – 2016-10-28 (×2): 12.5 mg via ORAL
  Filled 2016-10-27 (×2): qty 1

## 2016-10-27 MED ORDER — LISINOPRIL-HYDROCHLOROTHIAZIDE 10-12.5 MG PO TABS: 1.0000 | ORAL_TABLET | Freq: Every morning | ORAL | Status: DC

## 2016-10-27 MED ORDER — APIXABAN 5 MG PO TABS
5.0000 mg | ORAL_TABLET | Freq: Two times a day (BID) | ORAL | Status: DC
  Administered 2016-10-27 – 2016-10-28 (×2): 5 mg via ORAL
  Filled 2016-10-27 (×2): qty 1

## 2016-10-27 MED ORDER — LISINOPRIL 10 MG PO TABS
10.0000 mg | ORAL_TABLET | Freq: Every day | ORAL | Status: DC
  Administered 2016-10-27 – 2016-10-28 (×2): 10 mg via ORAL
  Filled 2016-10-27 (×2): qty 1

## 2016-10-27 MED ORDER — ENOXAPARIN SODIUM 40 MG/0.4ML ~~LOC~~ SOLN: 40.0000 mg | Freq: Every day | SUBCUTANEOUS | Status: DC

## 2016-10-27 MED ORDER — STROKE: EARLY STAGES OF RECOVERY BOOK: Freq: Once | Status: DC

## 2016-10-27 MED ORDER — PRAVASTATIN SODIUM 40 MG PO TABS
40.0000 mg | ORAL_TABLET | Freq: Every day | ORAL | Status: DC
  Administered 2016-10-27: 40 mg via ORAL
  Filled 2016-10-27: qty 1

## 2016-10-27 MED ORDER — ASPIRIN EC 81 MG PO TBEC: 81.0000 mg | DELAYED_RELEASE_TABLET | Freq: Every day | ORAL | Status: DC

## 2016-10-27 MED ORDER — INSULIN DETEMIR 100 UNIT/ML ~~LOC~~ SOLN
16.0000 [IU] | Freq: Every day | SUBCUTANEOUS | Status: DC
  Administered 2016-10-27: 16 [IU] via SUBCUTANEOUS
  Filled 2016-10-27 (×2): qty 0.16

## 2016-10-27 NOTE — Evaluation (Signed)
Speech Language Pathology Evaluation Patient Details Name: William [name] MRN: [id number] DOB: April 28, 1934 Today's Date: 10/27/2016 Time: [id number] SLP Time Calculation (min) (ACUTE ONLY): 27 min  Problem List:  Patient Active Problem List   Diagnosis Date Noted  . Stroke ([place]) 10/26/2016  . Obese 05/07/2014  . Expected blood loss anemia 05/07/2014  . S/P right TKA 05/06/2014  . DJD (degenerative joint disease) of knee 03/25/2014  . Preop cardiovascular exam 03/19/2014  . Abnormal ECG 03/19/2014  . Hypertension   . Diabetes mellitus without complication ([place])   . Hypercholesteremia   . Arthritis   . Prostate cancer ([place])   . Sleep apnea    Past Medical History:  Past Medical History:  Diagnosis Date  . Arthritis   . Diabetes mellitus without complication ([place])   . Hypercholesteremia   . Hypertension   . Prostate cancer [facility])    with radiation treatment  . Sleep apnea 2/14   "mild per patient" hasnt gotten CPAP machine   Past Surgical History:  Past Surgical History:  Procedure Laterality Date  . ADENOIDECTOMY    . FLEXIBLE SIGMOIDOSCOPY N/A 09/12/2013   Procedure: FLEXIBLE SIGMOIDOSCOPY;  Surgeon: Milus Banister, MD;  Location: [facility] ENDOSCOPY;  Service: Endoscopy;  Laterality: N/A;  . HERNIA REPAIR    . HOT HEMOSTASIS N/A 09/12/2013   Procedure: HOT HEMOSTASIS (ARGON PLASMA COAGULATION/BICAP);  Surgeon: Milus Banister, MD;  Location: [facility];  Service: Endoscopy;  Laterality: N/A;  . LIPOMA EXCISION    . OTHER SURGICAL HISTORY     s/p prostate radiation  . TONSILLECTOMY    . TOTAL KNEE ARTHROPLASTY Left 03/25/2014   Procedure: LEFT TOTAL KNEE ARTHROPLASTY;  Surgeon: [name], MD;  Location: [facility] ORS;  Service: Orthopedics;  Laterality: Left;  . TOTAL KNEE ARTHROPLASTY Right 05/06/2014   Procedure: RIGHT TOTAL KNEE ARTHROPLASTY;  Surgeon: [name], MD;  Location: [facility] ORS;  Service: Orthopedics;  Laterality: Right;   HPI:   82 y.o. male with HTN, HLD, DM  on insulin, presented to [facility] ED for evaluation of sudden onset of slurred speech that occurred earlier in the day prior to this admission and per wife she has also noted right facial droop. MRI brain-Acute left frontoparietal ischemic infarct   Assessment / Plan / Recommendation Clinical Impression  Pt referred for speech/language/cognitive assessment following hospital admission for stroke. Pt reports that all symptoms have resolved and he feels back to baseline. Pt participated with the [facility] Cognitive Assessment 7.1 and scored 27/30 possible points which is typical for WNL functioning. Discussed taking precautions with higher risk cognitive tasks upon return home such as medication management and finances to ensure accuracy. Pt and family verbalized agreement. No further SLP services indicated at this time.     SLP Assessment  Patient does not need any further Speech Lanaguage Pathology Services    Follow Up Recommendations  None    Frequency and Duration           SLP Evaluation Cognition  Overall Cognitive Status: Within Functional Limits for tasks assessed Arousal/Alertness: Awake/alert Orientation Level: Oriented X4 Attention: Sustained Sustained Attention: Appears intact Memory: Appears intact Awareness: Appears intact Problem Solving: Appears intact Safety/Judgment: Appears intact       Comprehension  Auditory Comprehension Overall Auditory Comprehension: Appears within functional limits for tasks assessed Yes/No Questions: Within Functional Limits Commands: Within Functional Limits Conversation: Complex Visual Recognition/Discrimination Discrimination: Within Function Limits Reading Comprehension Reading Status: Within funtional limits    Expression Expression  Primary Mode of Expression: Verbal Verbal Expression Overall Verbal Expression: Appears within functional limits for tasks assessed Initiation: No impairment Level of Generative/Spontaneous  Verbalization: Conversation Repetition: No impairment Naming: No impairment Pragmatics: No impairment Written Expression Dominant Hand: Left Written Expression: Within Functional Limits   Oral / Motor  Oral Motor/Sensory Function Overall Oral Motor/Sensory Function: Within functional limits Motor Speech Overall Motor Speech: Appears within functional limits for tasks assessed Respiration: Within functional limits Phonation: Normal Resonance: Within functional limits Articulation: Within functional limitis Intelligibility: Intelligible Motor Planning: Witnin functional limits Motor Speech Errors: Not applicable   GO          Functional Assessment Tool Used: clinician judgement Functional Limitations: Motor speech Motor Speech Current Status [phone]): 0 percent impaired, limited or restricted Motor Speech Goal Status [id number]): 0 percent impaired, limited or restricted Motor Speech Goal Status [id number]): 0 percent impaired, limited or restricted         Vinetta Berg[place], [place] Pager [phone] 10/27/2016, 3:15 PM

## 2016-10-27 NOTE — Progress Notes (Signed)
PROGRESS NOTE        PATIENT DETAILS Name: William [name] Age: 82 y.o. Sex: male Date of Birth: 1933/12/22 Admit Date: 10/26/2016 Admitting Physician Iskra Barbera Setters, MD [id number] Maudie Mercury, MD  Brief Narrative: Patient is a 82 y.o. male with past medical history of diabetes, dyslipidemia who presented with sudden onset of dysarthria and right facial droop, admitted for further evaluation and treatment. Found to have new onset atrial fibrillation/atrial flutter on 12-lead EKG. See below for further details  Subjective: Speech clear, no facial droop.  Assessment/Plan: Acute left frontoparietal ischemic infarct: Presumed to be embolic, given new diagnosis of atrial fibrillation. Discussed with neurology, started on anticoagulation with Eliquis. LDL 72, A1c pending. Await echo/carotids.  Atrial flutter/atrial fibrillation: Rate controlled without the use of any rate limiting agents.CHADS2vasc of 5.Started anticoagulation with Eliquis. Await echo. Family would like to follow with cardiology-Dr. Einar Gip spoke with Dr. Einar Gip over the phone, he will arrange for outpatient follow-up.  Insulin-dependent type 2 diabetes: CBGs stable-continue extending units of Levemir along with SSI. Await A1c  Dyslipidemia: Continue with pravastatin 40 mg-follow LDL in the outpatient setting  Hypertension: Controlled, continue lisinopril and HCTZ.  DVT Prophylaxis: Full dose anticoagulation with Eliquis  Code Status: Full code   Family Communication: Spouse and son at bedside  Disposition Plan: Remain probably home on 12/1  Antimicrobial agents: None  Procedures: None  CONSULTS:  neurology  Time spent: 25 minutes-Greater than 50% of this time was spent in counseling, explanation of diagnosis, planning of further management, and coordination of care.  MEDICATIONS: Anti-infectives    None      Scheduled Meds: .  stroke: mapping our early stages of recovery book   Does  not apply Once  . apixaban  5 mg Oral BID  . lisinopril  10 mg Oral Daily   And  . hydrochlorothiazide  12.5 mg Oral Daily  . insulin aspart  0-9 Units Subcutaneous TID WC  . insulin detemir  16 Units Subcutaneous Q2200  . pravastatin  40 mg Oral Q supper   Continuous Infusions: PRN Meds:.   PHYSICAL EXAM: Vital signs: Vitals:   10/27/16 0015 10/27/16 0200 10/27/16 1000 10/27/16 1226  BP: (!) 128/99 138/71 122/81 (!) 158/89  Pulse: 61 (!) 54 (!) 52 74  Resp: 18 19 17 17   Temp: 97.8 F (36.6 C) 98 F (36.7 C) 97.3 F (36.3 C) 97.8 F (36.6 C)  TempSrc: Oral Oral Oral Oral  SpO2: 97% 96% 98% 98%  Weight: 104.3 kg (230 lb)     Height: 6\' 1"  (1.854 m)      Filed Weights   10/26/16 1016 10/27/16 0015  Weight: 106.1 kg (234 lb) 104.3 kg (230 lb)   Body mass index is 30.34 kg/m.   General appearance :Awake, alert, not in any distress. Speech Clear. Not toxic Looking Eyes:, pupils equally reactive to light and accomodation,no scleral icterus.Pink conjunctiva HEENT: Atraumatic and Normocephalic Neck: supple, no JVD. No cervical lymphadenopathy. No thyromegaly Resp:Good air entry bilaterally, no added sounds  CVS: S1 S2 irregular, no murmurs.  GI: Bowel sounds present, Non tender and not distended with no gaurding, rigidity or rebound.No organomegaly Extremities: B/L Lower Ext shows no edema, both legs are warm to touch Neurology:  speech clear,Non focal, sensation is grossly intact. Psychiatric: Normal judgment and insight. Alert and oriented x 3. Normal mood.  Musculoskeletal:No digital cyanosis Skin:No Rash, warm and dry Wounds:N/A  I have personally reviewed following labs and imaging studies  LABORATORY DATA: CBC:  Recent Labs Lab 10/26/16 1033 10/26/16 1044 10/27/16 1044  WBC 10.4  --  10.7*  NEUTROABS 8.0*  --   --   HGB 15.2 15.6 15.7  HCT 45.4 46.0 45.7  MCV 95.0  --  94.2  PLT 196  --  [id number]    Basic Metabolic Panel:  Recent Labs Lab 10/26/16 1033  10/26/16 1044 10/27/16 0341  NA 141 145  --   K 4.3 4.3  --   CL 111 111  --   CO2 22  --   --   GLUCOSE 162* 157*  --   BUN 34* 32*  --   CREATININE 1.06 1.00 0.95  CALCIUM 9.6  --   --     GFR: Estimated Creatinine Clearance: 78.7 mL/min (by C-G formula based on SCr of 0.95 mg/dL).  Liver Function Tests:  Recent Labs Lab 10/26/16 1033  AST 26  ALT 24  ALKPHOS 69  BILITOT 1.7*  PROT 7.2  ALBUMIN 4.1   No results for input(s): LIPASE, AMYLASE in the last 168 hours. No results for input(s): AMMONIA in the last 168 hours.  Coagulation Profile:  Recent Labs Lab 10/26/16 1033  INR 1.02    Cardiac Enzymes: No results for input(s): CKTOTAL, CKMB, CKMBINDEX, TROPONINI in the last 168 hours.  BNP (last 3 results) No results for input(s): PROBNP in the last 8760 hours.  HbA1C: No results for input(s): HGBA1C in the last 72 hours.  CBG:  Recent Labs Lab 10/26/16 1805 10/26/16 2149 10/27/16 0056 10/27/16 0611 10/27/16 1133  GLUCAP 104* 199* 124* 129* 186*    Lipid Profile:  Recent Labs  10/27/16 0341  CHOL 125  HDL 28*  LDLCALC 72  TRIG 124  CHOLHDL 4.5    Thyroid Function Tests: No results for input(s): TSH, T4TOTAL, FREET4, T3FREE, THYROIDAB in the last 72 hours.  Anemia Panel: No results for input(s): VITAMINB12, FOLATE, FERRITIN, TIBC, IRON, RETICCTPCT in the last 72 hours.  Urine analysis:    Component Value Date/Time   COLORURINE YELLOW 04/29/2014 [place] 04/29/2014 1033   LABSPEC 1.016 04/29/2014 1033   PHURINE 5.5 04/29/2014 1033   GLUCOSEU NEGATIVE 04/29/2014 1033   HGBUR NEGATIVE 04/29/2014 1033   BILIRUBINUR NEGATIVE 04/29/2014 1033   KETONESUR NEGATIVE 04/29/2014 1033   PROTEINUR NEGATIVE 04/29/2014 1033   UROBILINOGEN 1.0 04/29/2014 1033   NITRITE NEGATIVE 04/29/2014 1033   LEUKOCYTESUR NEGATIVE 04/29/2014 1033    Sepsis Labs: Lactic Acid, Venous No results found for: LATICACIDVEN  MICROBIOLOGY: No  results found for this or any previous visit (from the past 240 hour(s)).  RADIOLOGY STUDIES/RESULTS: Dg Chest 2 View  Result Date: 10/27/2016 CLINICAL DATA:  Recent stroke, subjectively the patient is improving EXAM: CHEST  2 VIEW COMPARISON:  Chest x-ray of 03/17/2014 FINDINGS: No active infiltrate or effusion is seen. Mediastinal and hilar contours are unremarkable. Mild cardiomegaly is stable. There are degenerative changes throughout the thoracic spine. IMPRESSION: Stable mild cardiomegaly.  No active lung disease. Electronically Signed   By: Ivar Drape M.D.   On: 10/27/2016 08:27   Mr Brain Wo Contrast  Result Date: 10/27/2016 CLINICAL DATA:  Slurred speech and right-sided facial droop presenting yesterday. EXAM: MRI HEAD WITHOUT CONTRAST MRA HEAD WITHOUT CONTRAST TECHNIQUE: Multiplanar, multiecho pulse sequences of the brain and surrounding structures were obtained without intravenous contrast. Angiographic  images of the head were obtained using MRA technique without contrast. COMPARISON:  CT 10/26/2016 FINDINGS: MRI HEAD FINDINGS Brain: Diffusion imaging shows punctate acute infarction affecting the cortex at the frontoparietal junction at the left vertex. No other acute infarction. No swelling or hemorrhage. Mild chronic small-vessel ischemic change affects the pons. No focal cerebellar insult. Cerebral hemispheres elsewhere show moderate chronic small-vessel ischemic changes of the white matter and an old left posterior frontal cortical and subcortical infarction. No mass lesion, hemorrhage, hydrocephalus or extra-axial collection. Vascular: Major vessels at the base of the brain show flow. Skull and upper cervical spine: Negative Sinuses/Orbits: Clear/normal Other: None MRA HEAD FINDINGS Both internal carotid arteries are widely patent into the brain. The anterior and middle cerebral vessels are patent without proximal stenosis, aneurysm or vascular malformation. Slightly diminished filling of  more distal left MCA branches. Both vertebral arteries are patent to the basilar. No basilar stenosis. Posterior circulation branch vessels are patent. IMPRESSION: Punctate acute infarction affecting the surface of the brain at the left frontoparietal junction at the vertex. No mass effect or hemorrhage. Chronic small-vessel ischemic changes elsewhere throughout the brain. Old left frontal cortical and subcortical infarction. MRA does not show any proximal stenosis or occlusion. There are slightly less numerous distal MCA branches demonstrated on the left than the right. This could be technical in nature. Electronically Signed   By: Nelson Chimes M.D.   On: 10/27/2016 09:32   [name] Head/brain [id number] Cm  Result Date: 10/27/2016 CLINICAL DATA:  Slurred speech and right-sided facial droop presenting yesterday. EXAM: MRI HEAD WITHOUT CONTRAST MRA HEAD WITHOUT CONTRAST TECHNIQUE: Multiplanar, multiecho pulse sequences of the brain and surrounding structures were obtained without intravenous contrast. Angiographic images of the head were obtained using MRA technique without contrast. COMPARISON:  CT 10/26/2016 FINDINGS: MRI HEAD FINDINGS Brain: Diffusion imaging shows punctate acute infarction affecting the cortex at the frontoparietal junction at the left vertex. No other acute infarction. No swelling or hemorrhage. Mild chronic small-vessel ischemic change affects the pons. No focal cerebellar insult. Cerebral hemispheres elsewhere show moderate chronic small-vessel ischemic changes of the white matter and an old left posterior frontal cortical and subcortical infarction. No mass lesion, hemorrhage, hydrocephalus or extra-axial collection. Vascular: Major vessels at the base of the brain show flow. Skull and upper cervical spine: Negative Sinuses/Orbits: Clear/normal Other: None MRA HEAD FINDINGS Both internal carotid arteries are widely patent into the brain. The anterior and middle cerebral vessels are patent without  proximal stenosis, aneurysm or vascular malformation. Slightly diminished filling of more distal left MCA branches. Both vertebral arteries are patent to the basilar. No basilar stenosis. Posterior circulation branch vessels are patent. IMPRESSION: Punctate acute infarction affecting the surface of the brain at the left frontoparietal junction at the vertex. No mass effect or hemorrhage. Chronic small-vessel ischemic changes elsewhere throughout the brain. Old left frontal cortical and subcortical infarction. MRA does not show any proximal stenosis or occlusion. There are slightly less numerous distal MCA branches demonstrated on the left than the right. This could be technical in nature. Electronically Signed   By: Nelson Chimes M.D.   On: 10/27/2016 09:32   Ct Head Code Stroke W/o Cm  Result Date: 10/26/2016 CLINICAL DATA:  Code stroke. Slurred speech and right-sided facial droop beginning 2 hours ago. Symptoms have resolved. Code stroke. EXAM: CT HEAD WITHOUT CONTRAST TECHNIQUE: Contiguous axial images were obtained from the base of the skull through the vertex without intravenous contrast. COMPARISON:  None. FINDINGS: Brain:  Generalized brain atrophy. No abnormality affecting the brainstem or cerebellum. Cerebral hemispheres show what is probably an old left frontal cortical and subcortical infarction. Punctate hyperdensity in a superficial vessel in that region could be secondary to an old calcified embolus. No sign of acute infarction, mass lesion, hemorrhage, hydrocephalus or extra-axial collection. Vascular: There is atherosclerotic calcification of the major vessels at the base of the brain. Skull: Negative Sinuses/Orbits: Clear/normal Other: None significant ASPECTS ([place] Stroke Program Early CT Score) - Ganglionic level infarction (caudate, lentiform nuclei, internal capsule, insula, M1-M3 cortex): 7 - Supraganglionic infarction (M4-M6 cortex): 3 Total score (0-10 with 10 being normal): 10  IMPRESSION: 1. No acute finding. No intracranial hemorrhage. 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 Punctate hyperdensity in that region could be secondary to an old calcified embolus. 2. ASPECTS is 10. These results were called by telephone at the time of interpretation on 10/26/2016 at 11:46 am to Dr. Milton Ferguson , who verbally acknowledged these results. Electronically Signed   By: Nelson Chimes M.D.   On: 10/26/2016 11:48     LOS: 1 day   Oren Binet, MD  [facility] Pager:[phone]  If 7PM-7AM, please contact night-coverage [email or web address] Password [facility] 10/27/2016, 12:37 PM

## 2016-10-27 NOTE — Progress Notes (Signed)
STROKE TEAM PROGRESS NOTE   HISTORY OF PRESENT ILLNESS (per record) William [name] is an 82 y.o. male with a history of diabetes mellitus, hypertension and hyperlipidemia presenting following an episode of facial droop and slurred speech lasting about 15 minutes. Onset was at 9:15 AM on 10/26/2016. He has no previous history of stroke nor TIA. He's been taking aspirin 81 mg per day. CT scan of the head showed no acute intracranial abnormality. NIH stroke score at the time of this evaluation was 0. He had no weakness of right upper and lower extremities.  LSN: 9:30 AM on 10/26/2016 tPA Given: No: Deficits resolved mRankin:   SUBJECTIVE (INTERVAL HISTORY) His  Wife and son are at the bedside.  Overall he feels his condition is completely resolved.  He is in atrial fibrillation which is a new finding.Wife requests Dr William [name] to be his cardiologist   OBJECTIVE Temp:  [97.3 F (36.3 C)-98 F (36.7 C)] 97.8 F (36.6 C) (11/30 1226) Pulse Rate:  [52-82] 74 (11/30 1226) Cardiac Rhythm: Atrial flutter (11/30 1000) Resp:  [15-22] 17 (11/30 1226) BP: (112-165)/(57-99) 158/89 (11/30 1226) SpO2:  [91 %-99 %] 98 % (11/30 1226) Weight:  [104.3 kg (230 lb)] 104.3 kg (230 lb) (11/30 0840)  CBC:   Recent Labs Lab 10/26/16 1033 10/26/16 1044 10/27/16 1044  WBC 10.4  --  10.7*  NEUTROABS 8.0*  --   --   HGB 15.2 15.6 15.7  HCT 45.4 46.0 45.7  MCV 95.0  --  94.2  PLT 196  --  [id number]    Basic Metabolic Panel:   Recent Labs Lab 10/26/16 1033 10/26/16 1044 10/27/16 0341  NA 141 145  --   K 4.3 4.3  --   CL 111 111  --   CO2 22  --   --   GLUCOSE 162* 157*  --   BUN 34* 32*  --   CREATININE 1.06 1.00 0.95  CALCIUM 9.6  --   --     Lipid Panel:     Component Value Date/Time   CHOL 125 10/27/2016 0341   TRIG 124 10/27/2016 0341   HDL 28 (L) 10/27/2016 0341   CHOLHDL 4.5 10/27/2016 0341   VLDL 25 10/27/2016 0341   LDLCALC 72 10/27/2016 0341   HgbA1c:  Lab Results  Component  Value Date   HGBA1C 7.1 (H) 03/25/2014   Urine Drug Screen: No results found for: LABOPIA, COCAINSCRNUR, LABBENZ, AMPHETMU, THCU, LABBARB    IMAGING  Dg Chest 2 View 10/27/2016 Stable mild cardiomegaly.  No active lung disease.   Mr William [name] Head/brain Wo Cm 10/27/2016 Punctate acute infarction affecting the surface of the brain at the left frontoparietal junction at the vertex. No mass effect or hemorrhage. Chronic small-vessel ischemic changes elsewhere throughout the brain. Old left frontal cortical and subcortical infarction.  MRA does not show any proximal stenosis or occlusion. There are slightly less numerous distal MCA branches demonstrated on the left than the right. This could be technical in nature.     Ct Head Code Stroke W/o Cm 10/26/2016 1. No acute finding. No intracranial hemorrhage. 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 Punctate hyperdensity in that region could be secondary to an old calcified embolus.  2. ASPECTS is 10.     PHYSICAL EXAM Obese elderly Caucasian male not in distress HEENT-  Normocephalic,  Neck supple with no masses, nodes, nodules or enlargement. Cardiovascular - regular rate and rhythm, S1, S2 normal, no  murmur,   Lungs - chest clear, no wheezing, rales  Abdomen - soft, non-tender; bowel sounds normal;  Extremities - no joint deformities,  Neurologic Examination: Mental Status: Alert, oriented, thought content appropriate.  Speech fluent without evidence of aphasia. Able to follow commands without difficulty. Cranial Nerves: II-Visual fields were normal. III/IV/VI-Pupils were equal and reacted normally to light. Extraocular movements were full and conjugate.    V/VII-no facial numbness and no facial weakness. VIII-normal. X-normal speech and symmetrical palatal movement. XI: trapezius strength/neck flexion strength normal bilaterally XII-midline tongue extension with normal strength. Motor: 5/5  bilaterally with normal tone and bulk Sensory: Normal throughout. Deep Tendon Reflexes: 1+ and symmetric. Plantars: Mute bilaterally Cerebellar: Normal finger-to-nose testing. Carotid auscultation: Normal      ASSESSMENT/PLAN [name] is a 82 y.o. male with history of sleep apnea, prostate cancer, hypertension, hyperlipidemia, and diabetes mellitus presenting with transient facial droop and slurred speech. The patient was found to be in atrial fibrillation.  He did not receive IV t-PA due to resolution of deficits.   Stroke:  Dominant infarct embolic secondary to atrial fibrillation without anticoagulation.  Resultant resolution of deficits  MRI - Punctate acute infarction affecting the surface of the brain at the left frontoparietal junction at the vertex.   MRA - MRA does not show any proximal stenosis or occlusion.  Carotid Doppler pending  2D Echo pending  LDL - 72  HgbA1c pending  VTE prophylaxis - Eliquis Diet Carb Modified Fluid consistency: Thin; Room service appropriate? Yes  aspirin 81 mg daily prior to admission, now on Eliquis (apixaban) daily  Patient counseled to be compliant with his antithrombotic medications  Ongoing aggressive stroke risk factor management  Therapy recommendations: Outpatient PT recommended. OT evaluation pending.  Disposition:  Pending  Hypertension  Stable  Permissive hypertension (OK if < 220/120) but gradually normalize in 5-7 days  Long-term BP goal normotensive  Hyperlipidemia  Home meds: Pravachol 40 mg daily resumed in hospital  LDL 72, goal < 70  Continue statin at discharge  Diabetes  HgbA1c pending, goal < 7.0  Controlled   Other Stroke Risk Factors  Advanced age  Former cigarette smoker. The patient quit smoking 33 years ago.  ETOH use, advised to drink no more than 1 - 2 drink(s) a day  Obesity, Body mass index is 30.34 kg/m., recommend weight loss, diet and exercise as appropriate    Hx stroke - old infarcts by MRI.  Coronary artery disease - EKG concerning for previous MIs.  Obstructive sleep apnea, not on CPAP at home  Atrial fibrillation - new diagnosis  Other Active Problems  Atrial fibrillation - new diagnosis - now on Eliquis  Abnormal EKG - possible remote MIs  Mildly elevated BUN - 34 -> 32 - probable dehydration  Mild leukocytosis - 10.7  Hospital day # 1  I have personally examined this patient, reviewed notes, independently viewed imaging studies, participated in medical decision making and plan of care.ROS completed by me personally and pertinent positives fully documented  I have made any additions or clarifications directly to the above note. Recommend start eliquis for anticoagulation and continue ongoing stroke evaluation. Long discussion with the patient and wife and son regarding risk for recurrent stroke, prevention and treatment. Greater than 50% time during this 35 minute visit was spent on counseling and coordination of care of her rectal fibrillation, stroke, prevention and treatment discussed with Dr. Lambert Keto, [place] Pager: [phone] 10/27/2016  3:20 PM   To contact Stroke Continuity provider, please refer to [email or web address] After hours, contact General Neurology

## 2016-10-27 NOTE — Evaluation (Signed)
Occupational Therapy Evaluation Patient Details Name: JAMARIEN MUDDIMAN MRN: [id number] DOB: June 28, 1934 Today's Date: 10/27/2016    History of Present Illness  82 y.o. male with HTN, HLD, DM on insulin, presented to [facility] ED for evaluation of sudden onset of slurred speech that occurred earlier in the day prior to this admission and per wife she has also noted right facial droop. MRI brain-punctate acute infarction affecting   Clinical Impression   Pt admitted with the above diagnoses and presents with below problem list. PTA pt was independent with ADLs. Pt currently supervision to mod I with OOB ADLs. Spouse and daughter present and report that pt is close to baseline with ADLs. Session details below. No further acute OT needs indicated, deferring further therapy needs to OPPT ([facility] Neuro rehab).     Follow Up Recommendations  No OT follow up    Equipment Recommendations  None recommended by OT    Recommendations for Other Services       Precautions / Restrictions Precautions Precautions: Fall Precaution Comments: wife reports fell 10-12 days ago when shuffling his feet and fell forward Restrictions Weight Bearing Restrictions: No      Mobility Bed Mobility Overal bed mobility: Independent                Transfers Overall transfer level: Modified independent Equipment used: None             General transfer comment: incr effort and momentum when no UE support    Balance Overall balance assessment: Needs assistance         Standing balance support: No upper extremity supported;During functional activity Standing balance-Leahy Scale: Good Standing balance comment: good static standing, fair dynamic standing balance                 Standardized Balance Assessment Standardized Balance Assessment : Berg Balance Test;Dynamic Gait Index Berg Balance Test Sit to Stand: Able to stand without using hands and stabilize independently Standing Unsupported:  Able to stand safely 2 minutes Sitting with Back Unsupported but Feet Supported on Floor or Stool: Able to sit safely and securely 2 minutes Stand to Sit: Sits safely with minimal use of hands Transfers: Able to transfer safely, minor use of hands Standing Unsupported with Eyes Closed: Able to stand 10 seconds safely Standing Ubsupported with Feet Together: Able to place feet together independently and stand 1 minute safely From Standing, Reach Forward with Outstretched Arm: Can reach forward >12 cm safely (5") From Standing Position, Turn to Look Behind Over each Shoulder: Turn sideways only but maintains balance Turn 360 Degrees: Able to turn 360 degrees safely one side only in 4 seconds or less Standing Unsupported, Alternately Place Feet on Step/Stool: Able to complete >2 steps/needs minimal assist Standing Unsupported, One Foot in Front: Able to take small step independently and hold 30 seconds Standing on One Leg: Tries to lift leg/unable to hold 3 seconds but remains standing independently Dynamic Gait Index Level Surface: Mild Impairment Change in Gait Speed: Moderate Impairment Gait with Horizontal Head Turns: Mild Impairment Gait with Vertical Head Turns: Mild Impairment Gait and Pivot Turn: Normal Step Over Obstacle: Mild Impairment Step Around Obstacles: Mild Impairment Steps: Moderate Impairment Total Score: 15      ADL Overall ADL's : Needs assistance/impaired Eating/Feeding: Set up;Sitting   Grooming: Supervision/safety;Standing   Upper Body Bathing: Set up;Sitting   Lower Body Bathing: Supervison/ safety;Sit to/from stand   Upper Body Dressing : Set up;Sitting   Lower Body  Dressing: Supervision/safety;Sit to/from stand   Toilet Transfer: Supervision/safety;Modified Independent;Ambulation   Toileting- Clothing Manipulation and Hygiene: Modified independent   Tub/ Shower Transfer: Supervision/safety;Ambulation;Min guard Tub/Shower Transfer Details (indicate  cue type and reason): simulated tub transfer with grab bars utilized. Uses grab bars at baseline.  Functional mobility during ADLs: Modified independent;Supervision/safety General ADL Comments: Pt completed toilet ans shower transfer, grooming task at sink and in-room functional mobility as detailed above. Pt's wife and daughter present during eval and expressed growing concern (PTA as well) about pt using tub shower instead of walk-in shower. Encouraged pt to start utilizing walk-in shower instead of tub for safety.      Vision Vision Assessment?: No apparent visual deficits   Perception     Praxis      Pertinent Vitals/Pain Pain Assessment: No/denies pain     Hand Dominance Left   Extremity/Trunk Assessment Upper Extremity Assessment Upper Extremity Assessment: Overall WFL for tasks assessed   Lower Extremity Assessment Lower Extremity Assessment: Defer to PT evaluation LLE Deficits / Details: trendelenburg gait with hip abductors 3+/5 (sidelying), knee extensor 4/5 (apparently both present prior to recent CVA)   Cervical / Trunk Assessment Cervical / Trunk Assessment: Kyphotic (mild)   Communication Communication Communication: HOH   Cognition Arousal/Alertness: Awake/alert Behavior During Therapy: Impulsive Overall Cognitive Status: Within Functional Limits for tasks assessed                     General Comments       Exercises       Shoulder Instructions      Home Living Family/patient expects to be discharged to:: Private residence Living Arrangements: Spouse/significant other Available Help at Discharge: Family;Available 24 hours/day Type of Home: House Home Access: Stairs to enter [employer] of Steps: 2 Entrance Stairs-Rails: None Home Layout: One level     Bathroom Shower/Tub: Tub/shower unit;Walk-in shower;Curtain   Bathroom Toilet: Handicapped height     Home Equipment: Grab bars - tub/shower;Cane - single point;Shower seat -  built in          Prior Functioning/Environment Level of Independence: Independent        Comments: wife has noticed shuffling gait for several months; he prefers to use tub/shower although wife encourages walk-in        OT Problem List:     OT Treatment/Interventions:      OT Goals(Current goals can be found in the care plan section) Acute Rehab OT Goals Patient Stated Goal: not fall  OT Frequency:     Barriers to D/C:            Co-evaluation              End of Session    Activity Tolerance: Patient tolerated treatment well Patient left: in chair;with call bell/phone within reach;with family/visitor present   Time: 1258-1315 OT Time Calculation (min): 17 min Charges:  OT Evaluation $OT Eval Low Complexity: 1 Procedure G-Codes:    Hortencia Pilar 2016/11/05, 1:28 PM

## 2016-10-27 NOTE — Progress Notes (Signed)
[place] for apixaban Indication: atrial fibrillation  Allergies  Allergen Reactions  . Glyburide Other (See Comments)    Hypoglycemia     Patient Measurements: Height: 6\' 1"  (185.4 cm) Weight: 230 lb (104.3 kg) IBW/kg (Calculated) : 79.9 Heparin Dosing Weight:   Vital Signs: Temp: 97.3 F (36.3 C) (11/30 1000) Temp Source: Oral (11/30 1000) BP: 122/81 (11/30 1000) Pulse Rate: 52 (11/30 1000)  Labs:  Recent Labs  10/26/16 1033 10/26/16 1044 10/27/16 0341 10/27/16 1044  HGB 15.2 15.6  --  15.7  HCT 45.4 46.0  --  45.7  PLT 196  --   --  209  APTT 29  --   --   --   LABPROT 13.4  --   --   --   INR 1.02  --   --   --   CREATININE 1.06 1.00 0.95  --     Estimated Creatinine Clearance: 78.7 mL/min (by C-G formula based on SCr of 0.95 mg/dL).   Medical History: Past Medical History:  Diagnosis Date  . Arthritis   . Diabetes mellitus without complication ([place])   . Hypercholesteremia   . Hypertension   . Prostate cancer [facility])    with radiation treatment  . Sleep apnea 2/14   "mild per patient" hasnt gotten CPAP machine    Assessment: 82 yo male with new TIA this admission. Did not receive tPA due to quick resolution of symptoms. To begin Eliquis. CBC wnl and stable Renal fx stable  Goal of Therapy:  Monitor platelets by anticoagulation protocol: Yes   Plan:  -Eliquis 5 mg po bid -Monitor s/sx bleeding   Harvel Quale 10/27/2016,12:20 PM

## 2016-10-27 NOTE — Consult Note (Signed)
Admission H&P    Chief Complaint: Transient slurred speech and right facial droop.  HPI: William [name] is an 82 y.o. male with a history of diabetes mellitus, hypertension and hyperlipidemia presenting following an episode of facial droop and slurred speech lasting about 15 minutes. Onset was at 9:15 AM on 10/26/2016. He has no previous history of stroke nor TIA. He's been taking aspirin 81 mg per day. CT scan of the head showed no acute intracranial abnormality. NIH stroke score at the time of this evaluation was 0. He had no weakness of right upper and lower extremities.  LSN: 9:30 AM on 10/26/2016 tPA Given: No: Deficits resolved mRankin:  Past Medical History:  Diagnosis Date  . Arthritis   . Diabetes mellitus without complication ([place])   . Hypercholesteremia   . Hypertension   . Prostate cancer [facility])    with radiation treatment  . Sleep apnea 2/14   "mild per patient" hasnt gotten CPAP machine    Past Surgical History:  Procedure Laterality Date  . ADENOIDECTOMY    . FLEXIBLE SIGMOIDOSCOPY N/A 09/12/2013   Procedure: FLEXIBLE SIGMOIDOSCOPY;  Surgeon: Milus Banister, MD;  Location: [facility] ENDOSCOPY;  Service: Endoscopy;  Laterality: N/A;  . HERNIA REPAIR    . HOT HEMOSTASIS N/A 09/12/2013   Procedure: HOT HEMOSTASIS (ARGON PLASMA COAGULATION/BICAP);  Surgeon: Milus Banister, MD;  Location: [facility];  Service: Endoscopy;  Laterality: N/A;  . LIPOMA EXCISION    . OTHER SURGICAL HISTORY     s/p prostate radiation  . TONSILLECTOMY    . TOTAL KNEE ARTHROPLASTY Left 03/25/2014   Procedure: LEFT TOTAL KNEE ARTHROPLASTY;  Surgeon: [name], MD;  Location: [facility] ORS;  Service: Orthopedics;  Laterality: Left;  . TOTAL KNEE ARTHROPLASTY Right 05/06/2014   Procedure: RIGHT TOTAL KNEE ARTHROPLASTY;  Surgeon: [name], MD;  Location: [facility] ORS;  Service: Orthopedics;  Laterality: Right;    Family History  Problem Relation Age of Onset  . Colon cancer Father   . Esophageal  cancer Neg Hx   . Rectal cancer Neg Hx   . Stomach cancer Neg Hx    Social History:  reports that he has quit smoking. He started smoking about 33 years ago. He has never used smokeless tobacco. He reports that he drinks alcohol. He reports that he does not use drugs.  Allergies:  Allergies  Allergen Reactions  . Glyburide Other (See Comments)    Hypoglycemia     Medications Prior to Admission  Medication Sig Dispense Refill  . amoxicillin (AMOXIL) 500 MG capsule Take 2,000 mg by mouth as directed. 1 hour prior to dental procedures.    Marland Kitchen aspirin 81 MG tablet Take 81 mg by mouth daily.    Marland Kitchen co-enzyme Q-10 30 MG capsule Take 300 mg by mouth daily.    Marland Kitchen glucosamine-chondroitin 500-400 MG tablet Take 1 tablet by mouth 2 (two) times daily.    Marland Kitchen HUMALOG KWIKPEN 100 UNIT/ML KiwkPen Inject 2-8 Units into the skin 3 (three) times daily.     Marland Kitchen LEVEMIR FLEXTOUCH 100 UNIT/ML Pen Inject 16 Units into the skin daily at 10 pm. As directed.     Marland Kitchen lisinopril-hydrochlorothiazide (PRINZIDE,ZESTORETIC) 10-12.5 MG per tablet Take 1 tablet by mouth every morning.     . metFORMIN (GLUCOPHAGE) 1000 MG tablet Take 1,000 mg by mouth 2 (two) times daily with a meal.    . ONGLYZA 5 MG TABS tablet Take 5 mg by mouth at bedtime.     Marland Kitchen  pravastatin (PRAVACHOL) 40 MG tablet Take 40 mg by mouth daily with supper.       ROS: History obtained from the patient  General ROS: negative for - chills, fatigue, fever, night sweats, weight gain or weight loss Psychological ROS: negative for - behavioral disorder, hallucinations, memory difficulties, mood swings or suicidal ideation Ophthalmic ROS: negative for - blurry vision, double vision, eye pain or loss of vision ENT ROS: negative for - epistaxis, nasal discharge, oral lesions, sore throat, tinnitus or vertigo Allergy and Immunology ROS: negative for - hives or itchy/watery eyes Hematological and Lymphatic ROS: negative for - bleeding problems, bruising or swollen  lymph nodes Endocrine ROS: negative for - galactorrhea, hair pattern changes, polydipsia/polyuria or temperature intolerance Respiratory ROS: negative for - cough, hemoptysis, shortness of breath or wheezing Cardiovascular ROS: negative for - chest pain, dyspnea on exertion, edema or irregular heartbeat Gastrointestinal ROS: negative for - abdominal pain, diarrhea, hematemesis, nausea/vomiting or stool incontinence Genito-Urinary ROS: negative for - dysuria, hematuria, incontinence or urinary frequency/urgency Musculoskeletal ROS: negative for - joint swelling or muscular weakness Neurological ROS: as noted in HPI Dermatological ROS: negative for rash and skin lesion changes  Physical Examination: Blood pressure (!) 128/99, pulse 61, temperature 97.8 F (36.6 C), temperature source Oral, resp. rate 18, height 6' 1" (1.854 m), weight 104.3 kg (230 lb), SpO2 97 %.  HEENT-  Normocephalic, no lesions, without obvious abnormality.  Normal external eye and conjunctiva.  Normal TM's bilaterally.  Normal auditory canals and external ears. Normal external nose, mucus membranes and septum.  Normal pharynx. Neck supple with no masses, nodes, nodules or enlargement. Cardiovascular - regular rate and rhythm, S1, S2 normal, no murmur, click, rub or gallop Lungs - chest clear, no wheezing, rales, normal symmetric air entry Abdomen - soft, non-tender; bowel sounds normal; no masses,  no organomegaly Extremities - no joint deformities, effusion, or inflammation  Neurologic Examination: Mental Status: Alert, oriented, thought content appropriate.  Speech fluent without evidence of aphasia. Able to follow commands without difficulty. Cranial Nerves: II-Visual fields were normal. III/IV/VI-Pupils were equal and reacted normally to light. Extraocular movements were full and conjugate.    V/VII-no facial numbness and no facial weakness. VIII-normal. X-normal speech and symmetrical palatal movement. XI:  trapezius strength/neck flexion strength normal bilaterally XII-midline tongue extension with normal strength. Motor: 5/5 bilaterally with normal tone and bulk Sensory: Normal throughout. Deep Tendon Reflexes: 1+ and symmetric. Plantars: Mute bilaterally Cerebellar: Normal finger-to-nose testing. Carotid auscultation: Normal  Results for orders placed or performed during the hospital encounter of 10/26/16 (from the past 48 hour(s))  CBG monitoring, ED     Status: Abnormal   Collection Time: 10/26/16 10:31 AM  Result Value Ref Range   Glucose-Capillary 163 (H) 65 - 99 mg/dL   Comment 1 Notify RN    Comment 2 Document in Chart   Protime-INR     Status: None   Collection Time: 10/26/16 10:33 AM  Result Value Ref Range   Prothrombin Time 13.4 11.4 - 15.2 seconds   INR 1.02   APTT     Status: None   Collection Time: 10/26/16 10:33 AM  Result Value Ref Range   aPTT 29 24 - 36 seconds  CBC     Status: None   Collection Time: 10/26/16 10:33 AM  Result Value Ref Range   WBC 10.4 4.0 - 10.5 K/uL   RBC 4.78 4.22 - 5.81 MIL/uL   Hemoglobin 15.2 13.0 - 17.0 g/dL   HCT 45.4 39.0 -  52.0 %   MCV 95.0 78.0 - 100.0 fL   MCH 31.8 26.0 - 34.0 pg   MCHC 33.5 30.0 - 36.0 g/dL   RDW 13.7 11.5 - 15.5 %   Platelets 196 150 - 400 K/uL  Differential     Status: Abnormal   Collection Time: 10/26/16 10:33 AM  Result Value Ref Range   Neutrophils Relative % 77 %   Neutro Abs 8.0 (H) 1.7 - 7.7 K/uL   Lymphocytes Relative 11 %   Lymphs Abs 1.1 0.7 - 4.0 K/uL   Monocytes Relative 10 %   Monocytes Absolute 1.0 0.1 - 1.0 K/uL   Eosinophils Relative 2 %   Eosinophils Absolute 0.3 0.0 - 0.7 K/uL   Basophils Relative 0 %   Basophils Absolute 0.0 0.0 - 0.1 K/uL  Comprehensive metabolic panel     Status: Abnormal   Collection Time: 10/26/16 10:33 AM  Result Value Ref Range   Sodium 141 135 - 145 mmol/L   Potassium 4.3 3.5 - 5.1 mmol/L   Chloride 111 101 - 111 mmol/L   CO2 22 22 - 32 mmol/L    Glucose, Bld 162 (H) 65 - 99 mg/dL   BUN 34 (H) 6 - 20 mg/dL   Creatinine, Ser 1.06 0.61 - 1.24 mg/dL   Calcium 9.6 8.9 - 10.3 mg/dL   Total Protein 7.2 6.5 - 8.1 g/dL   Albumin 4.1 3.5 - 5.0 g/dL   AST 26 15 - 41 U/L   ALT 24 17 - 63 U/L   Alkaline Phosphatase 69 38 - 126 U/L   Total Bilirubin 1.7 (H) 0.3 - 1.2 mg/dL   GFR calc non Af Amer >60 >60 mL/min   GFR calc Af Amer >60 >60 mL/min    Comment: (NOTE) The eGFR has been calculated using the CKD EPI equation. This calculation has not been validated in all clinical situations. eGFR's persistently <60 mL/min signify possible Chronic Kidney Disease.    Anion gap 8 5 - 15  I-stat troponin, ED     Status: None   Collection Time: 10/26/16 10:41 AM  Result Value Ref Range   Troponin i, poc 0.04 0.00 - 0.08 ng/mL   Comment 3            Comment: Due to the release kinetics of cTnI, a negative result within the first hours of the onset of symptoms does not rule out myocardial infarction with certainty. If myocardial infarction is still suspected, repeat the test at appropriate intervals.   I-Stat Chem 8, ED     Status: Abnormal   Collection Time: 10/26/16 10:44 AM  Result Value Ref Range   Sodium 145 135 - 145 mmol/L   Potassium 4.3 3.5 - 5.1 mmol/L   Chloride 111 101 - 111 mmol/L   BUN 32 (H) 6 - 20 mg/dL   Creatinine, Ser 1.00 0.61 - 1.24 mg/dL   Glucose, Bld 157 (H) 65 - 99 mg/dL   Calcium, Ion 1.21 1.15 - 1.40 mmol/L   TCO2 22 0 - 100 mmol/L   Hemoglobin 15.6 13.0 - 17.0 g/dL   HCT 46.0 39.0 - 52.0 %  CBG monitoring, ED     Status: None   Collection Time: 10/26/16  1:34 PM  Result Value Ref Range   Glucose-Capillary 82 65 - 99 mg/dL  CBG monitoring, ED     Status: Abnormal   Collection Time: 10/26/16  6:05 PM  Result Value Ref Range   Glucose-Capillary 104 (H) 65 -   99 mg/dL  CBG monitoring, ED     Status: Abnormal   Collection Time: 10/26/16  9:49 PM  Result Value Ref Range   Glucose-Capillary 199 (H) 65 - 99  mg/dL  Glucose, capillary     Status: Abnormal   Collection Time: 10/27/16 12:56 AM  Result Value Ref Range   Glucose-Capillary 124 (H) 65 - 99 mg/dL   Comment 1 Notify RN    Comment 2 Document in Chart    Ct Head Code Stroke W/o Cm  Result Date: 10/26/2016 CLINICAL DATA:  Code stroke. Slurred speech and right-sided facial droop beginning 2 hours ago. Symptoms have resolved. Code stroke. EXAM: CT HEAD WITHOUT CONTRAST TECHNIQUE: Contiguous axial images were obtained from the base of the skull through the vertex without intravenous contrast. COMPARISON:  None. FINDINGS: Brain: Generalized brain atrophy. No abnormality affecting the brainstem or cerebellum. Cerebral hemispheres show what is probably an old left frontal cortical and subcortical infarction. Punctate hyperdensity in a superficial vessel in that region could be secondary to an old calcified embolus. No sign of acute infarction, mass lesion, hemorrhage, hydrocephalus or extra-axial collection. Vascular: There is atherosclerotic calcification of the major vessels at the base of the brain. Skull: Negative Sinuses/Orbits: Clear/normal Other: None significant ASPECTS (Alberta Stroke Program Early CT Score) - Ganglionic level infarction (caudate, lentiform nuclei, internal capsule, insula, M1-M3 cortex): 7 - Supraganglionic infarction (M4-M6 cortex): 3 Total score (0-10 with 10 being normal): 10 IMPRESSION: 1. No acute finding. No intracranial hemorrhage. 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 Punctate hyperdensity in that region could be secondary to an old calcified embolus. 2. ASPECTS is 10. These results were called by telephone at the time of interpretation on 10/26/2016 at 11:46 am to Dr. JOSEPH ZAMMIT , who verbally acknowledged these results. Electronically Signed   By: Mark  Shogry M.D.   On: 10/26/2016 11:48    Assessment: 82 y.o. male with multiple risk factors for stroke presenting with probable  transient ischemic attack. However, an acute subcortical left cerebral infarction cannot be ruled out.  Stroke Risk Factors - diabetes mellitus, hyperlipidemia and hypertension  Plan: 1. HgbA1c, fasting lipid panel 2. MRI, MRA  of the brain without contrast 3. PT consult, OT consult, Speech consult 4. Echocardiogram 5. Carotid dopplers 6. Prophylactic therapy-Antiplatelet med: Aspirin  7. Risk factor modification 8. Telemetry monitoring  C.R. , MD [facility] [phone]  10/27/2016, 1:49 AM    

## 2016-10-27 NOTE — Evaluation (Signed)
Physical Therapy Evaluation and Discharge Patient Details Name: William [name] MRN: [id number] DOB: [birth year] Today's Date: 10/27/2016   History of Present Illness   81 y.o. male with HTN, HLD, DM on insulin, presented to [facility] ED for evaluation of sudden onset of slurred speech that occurred earlier in the day prior to this admission and per wife she has also noted right facial droop. MRI brain-punctate acute infarction affecting    Clinical Impression  Patient evaluated by Physical Therapy with no further acute PT needs identified. All education has been completed and the patient has no further questions. Wife reports several month h/o of shuffling gait with patient recently falling forward due to shuffling. Patient noted to have Left leg weakness (prior to this event per wife; has progressively worsened).  Patient and wife in agreement with OPPT recommendation. PT is signing off. Thank you for this referral.     Follow Up Recommendations Outpatient PT;Supervision - Intermittent (familiar with [facility] Neuro rehab (wife went there))    Equipment Recommendations  None recommended by PT    Recommendations for Other Services OT consult;Speech consult     Precautions / Restrictions Precautions Precautions: Fall Precaution Comments: wife reports fell 10-12 days ago when shuffling his feet and fell forward      Mobility  Bed Mobility Overal bed mobility: Independent                Transfers Overall transfer level: Modified independent Equipment used: None             General transfer comment: incr effort and momentum when no UE support  Ambulation/Gait Ambulation/Gait assistance: Min guard;Supervision Ambulation Distance (Feet): 200 Feet Assistive device: None Gait Pattern/deviations: Decreased stride length;Decreased weight shift to left;Trendelenburg;Wide base of support Gait velocity: able to slow significantly; cannot incr velocity significantly   General Gait Details:  Lt hip trendelenburg with incr Rt lateral limp; no shuffling noted; educated wife to cue pt to perform heelstrike if she notices shuffling  Stairs Stairs: Yes Stairs assistance: Min guard Stair Management: No rails;Step to pattern;Forwards Number of Stairs: 5 General stair comments: regular height and shorter steps; pt hesitant, slow, deliberate  Wheelchair Mobility    Modified Rankin (Stroke Patients Only) Modified Rankin (Stroke Patients Only) Pre-Morbid Rankin Score: No significant disability Modified Rankin: Slight disability     Balance                                 Standardized Balance Assessment Standardized Balance Assessment : Berg Balance Test;Dynamic Gait Index Berg Balance Test Sit to Stand: Able to stand without using hands and stabilize independently Standing Unsupported: Able to stand safely 2 minutes Sitting with Back Unsupported but Feet Supported on Floor or Stool: Able to sit safely and securely 2 minutes Stand to Sit: Sits safely with minimal use of hands Transfers: Able to transfer safely, minor use of hands Standing Unsupported with Eyes Closed: Able to stand 10 seconds safely Standing Ubsupported with Feet Together: Able to place feet together independently and stand 1 minute safely From Standing, Reach Forward with Outstretched Arm: Can reach forward >12 cm safely (5") From Standing Position, Turn to Look Behind Over each Shoulder: Turn sideways only but maintains balance Turn 360 Degrees: Able to turn 360 degrees safely one side only in 4 seconds or less Standing Unsupported, Alternately Place Feet on Step/Stool: Able to complete >2 steps/needs minimal assist Standing Unsupported, One Foot in  Front: Able to take small step independently and hold 30 seconds Standing on One Leg: Tries to lift leg/unable to hold 3 seconds but remains standing independently Dynamic Gait Index Level Surface: Mild Impairment Change in Gait Speed: Moderate  Impairment Gait with Horizontal Head Turns: Mild Impairment Gait with Vertical Head Turns: Mild Impairment Gait and Pivot Turn: Normal Step Over Obstacle: Mild Impairment Step Around Obstacles: Mild Impairment Steps: Moderate Impairment Total Score: 15       Pertinent Vitals/Pain Pain Assessment: No/denies pain    Home Living Family/patient expects to be discharged to:: Private residence Living Arrangements: Spouse/significant other Available Help at Discharge: Family;Available 24 hours/day Type of Home: House Home Access: Stairs to enter Entrance Stairs-Rails: None Entrance Stairs-Number of Steps: 2 Home Layout: One level Home Equipment: Grab bars - tub/shower;Cane - single point      Prior Function Level of Independence: Independent         Comments: wife has noticed shuffling gait for several months; he prefers to use tub/shower although wife encourages walk-in     Hand Dominance   Dominant Hand: Left    Extremity/Trunk Assessment   Upper Extremity Assessment: Defer to OT evaluation           Lower Extremity Assessment: LLE deficits/detail   LLE Deficits / Details: trendelenburg gait with hip abductors 3+/5 (sidelying), knee extensor 4/5 (apparently both present prior to recent CVA)  Cervical / Trunk Assessment: Kyphotic (mild)  Communication   Communication: HOH  Cognition Arousal/Alertness: Awake/alert Behavior During Therapy: Impulsive Overall Cognitive Status: Within Functional Limits for tasks assessed                      General Comments General comments (skin integrity, edema, etc.): Wife and son present     Exercises     Assessment/Plan    PT Assessment All further PT needs can be met in the next venue of care  PT Problem List Decreased strength;Decreased balance;Decreased mobility;Decreased safety awareness          PT Treatment Interventions      PT Goals (Current goals can be found in the Care Plan section)  Acute  Rehab PT Goals Patient Stated Goal: not fall PT Goal Formulation: All assessment and education complete, DC therapy    Frequency     Barriers to discharge        Co-evaluation               End of Session Equipment Utilized During Treatment: Gait belt Activity Tolerance: Patient tolerated treatment well Patient left: in chair;with call bell/phone within reach;with chair alarm set;with family/visitor present           Time: 1133-1206 PT Time Calculation (min) (ACUTE ONLY): 33 min   Charges:   PT Evaluation $PT Eval Low Complexity: 1 Procedure PT Treatments $Gait Training: 8-22 mins   PT G CodesJeanie Cooks Flavio Lindroth Nov 16, 2016, 12:26 PM Pager [phone]

## 2016-10-28 ENCOUNTER — Inpatient Hospital Stay (HOSPITAL_COMMUNITY): Payer: Medicare Other

## 2016-10-28 DIAGNOSIS — I482 Chronic atrial fibrillation: Secondary | ICD-10-CM

## 2016-10-28 DIAGNOSIS — G459 Transient cerebral ischemic attack, unspecified: Secondary | ICD-10-CM

## 2016-10-28 DIAGNOSIS — I6789 Other cerebrovascular disease: Secondary | ICD-10-CM

## 2016-10-28 LAB — VAS US CAROTID
LCCADSYS: -75 cm/s
LCCAPDIAS: 18 cm/s
LCCAPSYS: 123 cm/s
LEFT ECA DIAS: -18 cm/s
LEFT VERTEBRAL DIAS: 15 cm/s
Left CCA dist dias: -17 cm/s
Left ICA dist dias: 20 cm/s
Left ICA dist sys: 71 cm/s
Left ICA prox dias: -11 cm/s
Left ICA prox sys: -44 cm/s
RCCADSYS: -88 cm/s
RCCAPDIAS: 14 cm/s
RCCAPSYS: 76 cm/s
RIGHT ECA DIAS: -16 cm/s
RIGHT VERTEBRAL DIAS: 9 cm/s

## 2016-10-28 LAB — GLUCOSE, CAPILLARY
Glucose-Capillary: 129 mg/dL — ABNORMAL HIGH (ref 65–99)
Glucose-Capillary: 151 mg/dL — ABNORMAL HIGH (ref 65–99)

## 2016-10-28 LAB — ECHOCARDIOGRAM COMPLETE
Height: 73 in
Weight: 3680 oz

## 2016-10-28 MED ORDER — APIXABAN 5 MG PO TABS: 5.0000 mg | ORAL_TABLET | Freq: Two times a day (BID) | ORAL | 0 refills | Status: DC

## 2016-10-28 MED ORDER — PERFLUTREN LIPID MICROSPHERE
1.0000 mL | INTRAVENOUS | Status: AC | PRN
  Administered 2016-10-28: 2 mL via INTRAVENOUS
  Filled 2016-10-28: qty 10

## 2016-10-28 NOTE — Discharge Summary (Signed)
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 any discharge medications will be authorized once you are discharged, as it is imperative that you return to your primary care physician (or establish a relationship with a primary care physician if you do not have one) for your post hospital discharge needs so that they can reassess your need for medications and monitor your lab values.  Total Time spent coordinating discharge including counseling, education and face to face time equals 35 minutes.  SignedOren Binet 10/28/2016 10:18 AM

## 2016-10-28 NOTE — Care Management Note (Signed)
Case Management Note  Patient Details  Name: William [name] MRN: [id number] Date of Birth: [birth year]  Subjective/Objective:                    Action/Plan: Pt discharging home later today. CM consulted for outpatient therapy. CM met with the patients wife and she would like patient to attend outpatient therapy at [facility]. Orders placed in EPIC and information on the AVS.  Pt also discharging on Eliquis. Benefits check revealed a cost of $161/ month d/t patient being in the coverage gap. CM spoke to Mrs Footman and she believes the gap will be done December 31st. Wife given 30 day free card and encouraged to ask the pharmacy the cost of the medication starting in January after the gap. Pt has appointment with cardiology next week and CM encouraged the wife to let the cardiologist know if Eliquis is going to be too expensive in January. Wife verbalized understanding.   Expected Discharge Date:  10/29/17               Expected Discharge Plan:  Home/Self Care  In-House Referral:     Discharge planning Services  CM Consult  Post Acute Care Choice:    Choice offered to:     DME Arranged:    DME Agency:     HH Arranged:    HH Agency:     Status of Service:  Completed, signed off  If discussed at [employer] of Stay Meetings, dates discussed:    Additional Comments:  Pollie Friar, RN 10/28/2016, 11:36 AM

## 2016-10-28 NOTE — Progress Notes (Signed)
STROKE TEAM PROGRESS NOTE   HISTORY OF PRESENT ILLNESS (per record) William [name] is an 82 y.o. male with a history of diabetes mellitus, hypertension and hyperlipidemia presenting following an episode of facial droop and slurred speech lasting about 15 minutes. Onset was at 9:15 AM on 10/26/2016. He has no previous history of stroke nor TIA. He's been taking aspirin 81 mg per day. CT scan of the head showed no acute intracranial abnormality. NIH stroke score at the time of this evaluation was 0. He had no weakness of right upper and lower extremities.  LSN: 9:30 AM on 10/26/2016 tPA Given: No: Deficits resolved mRankin:   SUBJECTIVE (INTERVAL HISTORY) His  Family is not at the bedside.  Overall he feels his condition is completely resolved.  He is in atrial fibrillation which is a new finding. t   OBJECTIVE Temp:  [97.3 F (36.3 C)-98.5 F (36.9 C)] 98 F (36.7 C) (12/01 0511) Pulse Rate:  [52-80] 66 (12/01 0511) Cardiac Rhythm: Atrial fibrillation;Atrial flutter (11/30 2044) Resp:  [17-20] 20 (12/01 0511) BP: (99-158)/(49-91) 153/89 (12/01 0511) SpO2:  [96 %-99 %] 99 % (12/01 0511) Weight:  [104.3 kg (230 lb)] 104.3 kg (230 lb) (11/30 0840)  CBC:   Recent Labs Lab 10/26/16 1033 10/26/16 1044 10/27/16 1044  WBC 10.4  --  10.7*  NEUTROABS 8.0*  --   --   HGB 15.2 15.6 15.7  HCT 45.4 46.0 45.7  MCV 95.0  --  94.2  PLT 196  --  [id number]    Basic Metabolic Panel:   Recent Labs Lab 10/26/16 1033 10/26/16 1044 10/27/16 0341  NA 141 145  --   K 4.3 4.3  --   CL 111 111  --   CO2 22  --   --   GLUCOSE 162* 157*  --   BUN 34* 32*  --   CREATININE 1.06 1.00 0.95  CALCIUM 9.6  --   --     Lipid Panel:     Component Value Date/Time   CHOL 125 10/27/2016 0341   TRIG 124 10/27/2016 0341   HDL 28 (L) 10/27/2016 0341   CHOLHDL 4.5 10/27/2016 0341   VLDL 25 10/27/2016 0341   LDLCALC 72 10/27/2016 0341   HgbA1c:  Lab Results  Component Value Date   HGBA1C 6.8 (H)  10/27/2016   Urine Drug Screen: No results found for: LABOPIA, COCAINSCRNUR, LABBENZ, AMPHETMU, THCU, LABBARB    IMAGING  Dg Chest 2 View 10/27/2016 Stable mild cardiomegaly.  No active lung disease.   Mr William [name] Head/brain Wo Cm 10/27/2016 Punctate acute infarction affecting the surface of the brain at the left frontoparietal junction at the vertex. No mass effect or hemorrhage. Chronic small-vessel ischemic changes elsewhere throughout the brain. Old left frontal cortical and subcortical infarction.  MRA does not show any proximal stenosis or occlusion. There are slightly less numerous distal MCA branches demonstrated on the left than the right. This could be technical in nature.     Ct Head Code Stroke W/o Cm 10/26/2016 1. No acute finding. No intracranial hemorrhage. 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 Punctate hyperdensity in that region could be secondary to an old calcified embolus.  2. ASPECTS is 10.     PHYSICAL EXAM Obese elderly Caucasian male not in distress HEENT-  Normocephalic,  Neck supple with no masses, nodes, nodules or enlargement. Cardiovascular - regular rate and rhythm, S1, S2 normal, no murmur,   Lungs - chest  clear, no wheezing, rales  Abdomen - soft, non-tender; bowel sounds normal;  Extremities - no joint deformities,  Neurologic Examination: Mental Status: Alert, oriented, thought content appropriate.  Speech fluent without evidence of aphasia. Able to follow commands without difficulty. Cranial Nerves: II-Visual fields were normal. III/IV/VI-Pupils were equal and reacted normally to light. Extraocular movements were full and conjugate.    V/VII-no facial numbness and no facial weakness. VIII-normal. X-normal speech and symmetrical palatal movement. XI: trapezius strength/neck flexion strength normal bilaterally XII-midline tongue extension with normal strength. Motor: 5/5 bilaterally with normal tone and  bulk Sensory: Normal throughout. Deep Tendon Reflexes: 1+ and symmetric. Plantars: Mute bilaterally Cerebellar: Normal finger-to-nose testing. Carotid auscultation: Normal      ASSESSMENT/PLAN [name] is a 83 y.o. male with history of sleep apnea, prostate cancer, hypertension, hyperlipidemia, and diabetes mellitus presenting with transient facial droop and slurred speech. The patient was found to be in atrial fibrillation.  He did not receive IV t-PA due to resolution of deficits.   Stroke:  Dominant infarct embolic secondary to atrial fibrillation without anticoagulation.  Resultant resolution of deficits  MRI - Punctate acute infarction affecting the surface of the brain at the left frontoparietal junction at the vertex.   MRA - MRA does not show any proximal stenosis or occlusion.  Carotid Doppler 1-39% internal carotid artery stenosis bilaterally. Vertebral arteries are patent with antegrade flow  2D Echo pending  LDL - 72  HgbA1c 6.8  VTE prophylaxis - Eliquis Diet Carb Modified Fluid consistency: Thin; Room service appropriate? Yes  aspirin 81 mg daily prior to admission, now on Eliquis (apixaban) daily  Patient counseled to be compliant with his antithrombotic medications  Ongoing aggressive stroke risk factor management  Therapy recommendations: Outpatient PT recommended. OT evaluation pending. Disposition:  Home Hypertension  Stable  Permissive hypertension (OK if < 220/120) but gradually normalize in 5-7 days  Long-term BP goal normotensive  Hyperlipidemia  Home meds: Pravachol 40 mg daily resumed in hospital  LDL 72, goal < 70  Continue statin at discharge  Diabetes  HgbA1c pending, goal < 7.0  Controlled   Other Stroke Risk Factors  Advanced age  Former cigarette smoker. The patient quit smoking 33 years ago.  ETOH use, advised to drink no more than 1 - 2 drink(s) a day  Obesity, Body mass index is 30.34 kg/m., recommend  weight loss, diet and exercise as appropriate   Hx stroke - old infarcts by MRI.  Coronary artery disease - EKG concerning for previous MIs.  Obstructive sleep apnea, not on CPAP at home  Atrial fibrillation - new diagnosis  Other Active Problems  Atrial fibrillation - new diagnosis - now on Eliquis  Abnormal EKG - possible remote MIs  Mildly elevated BUN - 34 -> 32 - probable dehydration  Mild leukocytosis - 10.7  Hospital day # 2  I have personally examined this patient, reviewed notes, independently viewed imaging studies, participated in medical decision making and plan of care.ROS completed by me personally and pertinent positives fully documented  I have made any additions or clarifications directly to the above note. Recommend start eliquis for anticoagulation and continue ongoing stroke evaluation. Agree with discharging patient home on eliquis and outpatient follow-up with cardiologist Dr.Ganji. Stroke team will sign off. Follow-up in the stroke clinic in 6 weeks. discussed with Dr. Lambert Keto, [place] Pager: [phone] 10/28/2016 2:53 PM   To contact Stroke Continuity provider, please refer to  [email or web address] After hours, contact General Neurology

## 2016-10-28 NOTE — Progress Notes (Signed)
*  PRELIMINARY RESULTS* Vascular Ultrasound Carotid Duplex (Doppler) has been completed.  Findings suggest 1-39% internal carotid artery stenosis bilaterally. Vertebral arteries are patent with antegrade flow.  10/28/2016 12:24 PM Maudry Mayhew, BS, RVT, RDCS, RDMS

## 2016-10-28 NOTE — Progress Notes (Signed)
  Echocardiogram 2D Echocardiogram with Definity has been performed.  Diamond Nickel 10/28/2016, 12:47 PM

## 2016-10-28 NOTE — Progress Notes (Signed)
Patient is being d/c home. Dc instructions given and patient and caregiver verbalized understanding.

## 2016-11-02 DIAGNOSIS — H903 Sensorineural hearing loss, bilateral: Secondary | ICD-10-CM | POA: Diagnosis not present

## 2016-11-04 DIAGNOSIS — Z09 Encounter for follow-up examination after completed treatment for conditions other than malignant neoplasm: Secondary | ICD-10-CM | POA: Diagnosis not present

## 2016-11-04 DIAGNOSIS — Z8673 Personal history of transient ischemic attack (TIA), and cerebral infarction without residual deficits: Secondary | ICD-10-CM | POA: Diagnosis not present

## 2016-11-04 DIAGNOSIS — I10 Essential (primary) hypertension: Secondary | ICD-10-CM | POA: Diagnosis not present

## 2016-11-04 DIAGNOSIS — I4891 Unspecified atrial fibrillation: Secondary | ICD-10-CM | POA: Diagnosis not present

## 2016-11-04 DIAGNOSIS — E119 Type 2 diabetes mellitus without complications: Secondary | ICD-10-CM | POA: Diagnosis not present

## 2016-11-04 DIAGNOSIS — E785 Hyperlipidemia, unspecified: Secondary | ICD-10-CM | POA: Diagnosis not present

## 2016-11-08 DIAGNOSIS — I10 Essential (primary) hypertension: Secondary | ICD-10-CM | POA: Diagnosis not present

## 2016-11-08 DIAGNOSIS — E039 Hypothyroidism, unspecified: Secondary | ICD-10-CM | POA: Diagnosis not present

## 2016-11-08 DIAGNOSIS — E119 Type 2 diabetes mellitus without complications: Secondary | ICD-10-CM | POA: Diagnosis not present

## 2016-11-10 ENCOUNTER — Ambulatory Visit: Payer: Medicare Other | Attending: Internal Medicine | Admitting: Physical Therapy

## 2016-11-10 DIAGNOSIS — M6281 Muscle weakness (generalized): Secondary | ICD-10-CM | POA: Diagnosis not present

## 2016-11-10 DIAGNOSIS — R2689 Other abnormalities of gait and mobility: Secondary | ICD-10-CM | POA: Diagnosis not present

## 2016-11-10 DIAGNOSIS — R2681 Unsteadiness on feet: Secondary | ICD-10-CM | POA: Insufficient documentation

## 2016-11-10 NOTE — Therapy (Signed)
[facility] [address] [place], [place], [address] Phone: [phone]   Fax:  [phone]  Physical Therapy Evaluation  Patient Details  Name: William [name] MRN: [id number] Date of Birth: 10/18/1934 Referring Provider: Jonetta Osgood (hospitalist); PCP: Jani Gravel, MD  Encounter Date: 11/10/2016      PT End of Session - 11/10/16 0932    Visit Number 1   Number of Visits 9   Date for PT Re-Evaluation 12/08/16   Authorization Type Medicare   PT Start Time 0845   PT Stop Time 0921   PT Time Calculation (min) 36 min   Equipment Utilized During Treatment Gait belt   Activity Tolerance Patient tolerated treatment well   Behavior During Therapy [facility] for tasks assessed/performed      Past Medical History:  Diagnosis Date  . Arthritis   . Diabetes mellitus without complication ([place])   . Hypercholesteremia   . Hypertension   . Prostate cancer [facility])    with radiation treatment  . Sleep apnea 2/14   "mild per patient" hasnt gotten CPAP machine    Past Surgical History:  Procedure Laterality Date  . ADENOIDECTOMY    . FLEXIBLE SIGMOIDOSCOPY N/A 09/12/2013   Procedure: FLEXIBLE SIGMOIDOSCOPY;  Surgeon: Milus Banister, MD;  Location: [facility] ENDOSCOPY;  Service: Endoscopy;  Laterality: N/A;  . HERNIA REPAIR    . HOT HEMOSTASIS N/A 09/12/2013   Procedure: HOT HEMOSTASIS (ARGON PLASMA COAGULATION/BICAP);  Surgeon: Milus Banister, MD;  Location: [facility] ENDOSCOPY;  Service: Endoscopy;  Laterality: N/A;  . LIPOMA EXCISION    . OTHER SURGICAL HISTORY     s/p prostate radiation  . TONSILLECTOMY    . TOTAL KNEE ARTHROPLASTY Left 03/25/2014   Procedure: LEFT TOTAL KNEE ARTHROPLASTY;  Surgeon: [name], MD;  Location: [facility] ORS;  Service: Orthopedics;  Laterality: Left;  . TOTAL KNEE ARTHROPLASTY Right 05/06/2014   Procedure: RIGHT TOTAL KNEE ARTHROPLASTY;  Surgeon: [name], MD;  Location: [facility] ORS;  Service: Orthopedics;   Laterality: Right;    There were no vitals filed for this visit.       Subjective Assessment - 11/10/16 0847    Subjective Pt is an 82 y/o male who presents to [place] s/p CVA on 10/26/16.  Pt reports initial symptoms of slurred speech and facial droop.  Pt denies any extremity weakness and symptoms lasted only 30 min.  Pt and wife feel pt is back to baseline except for fatigue.  While in hospital PT discovered some hip weakness prior to admission and recommended some additional therapy to address.   Pertinent History arthritis, DM, HLD, HTN, a fib   Patient Stated Goals return to tai chi, get back to regular activity   Currently in Pain? No/denies            [facility] PT Assessment - 11/10/16 0851      Assessment   Medical Diagnosis CVA   Referring Provider Jonetta Osgood (hospitalist); PCP: Jani Gravel, MD   Onset Date/Surgical Date 10/26/16   Hand Dominance Left   Next MD Visit 11/11/16   Prior Therapy in hospital; and after TKAs     Precautions   Precautions None     Restrictions   Weight Bearing Restrictions No     Balance Screen   Has the patient fallen in the past 6 months Yes   How many times? 1 (2 weeks prior to CVA-wife thinks it's because pt was shuffling feet)  Has the patient had a decrease in activity level because of a fear of falling?  No   Is the patient reluctant to leave their home because of a fear of falling?  No     Home Environment   Living Environment Private residence   Living Arrangements Spouse/significant other   Type of [place] to enter   Entrance Stairs-Number of Steps 1   [place] One level     Prior Function   Level of Independence Independent   Vocation Retired   Biomedical scientist retired from [employer]   Leisure art class, tai chi, travel, shopping     Observation/Other Assessments   Focus on Therapeutic Outcomes ([place])  58 (42% limited; predicted 29% limited)    Stroke Impact Scale  Mobility: 91.7%     Strength   Strength Assessment Site Hip;Knee;Ankle   Right Hip Flexion 3/5   Left Hip Flexion 3/5   Right/Left Knee Right;Left   Right Knee Flexion 5/5   Right Knee Extension 5/5   Left Knee Flexion 4+/5   Left Knee Extension 5/5   Right Ankle Dorsiflexion 5/5   Left Ankle Dorsiflexion 4-/5     Ambulation/Gait   Ambulation/Gait Yes   Ambulation/Gait Assistance 5: Supervision   Ambulation Distance (Feet) 200 Feet   Assistive device None   Gait Pattern Poor foot clearance - left;Poor foot clearance - right;Lateral hip instability;Trendelenburg  L foot slap, decreased hip extension R >L   Gait velocity 2.85 ft/sec  11.5   Gait Comments wife reports she has noticed increased shuffling pattern at home - not observed in clinic today     Standardized Balance Assessment   Standardized Balance Assessment Berg Balance Test;Dynamic Gait Index;Timed Up and Go Test     Berg Balance Test   Sit to Stand Able to stand without using hands and stabilize independently   Standing Unsupported Able to stand safely 2 minutes   Sitting with Back Unsupported but Feet Supported on Floor or Stool Able to sit safely and securely 2 minutes   Stand to Sit Sits safely with minimal use of hands   Transfers Able to transfer safely, minor use of hands   Standing Unsupported with Eyes Closed Able to stand 10 seconds safely   Standing Ubsupported with Feet Together Able to place feet together independently and stand 1 minute safely   From Standing, Reach Forward with Outstretched Arm Can reach confidently >25 cm (10")   From Standing Position, Pick up Object from Floor Able to pick up shoe safely and easily   From Standing Position, Turn to Look Behind Over each Shoulder Looks behind from both sides and weight shifts well   Turn 360 Degrees Able to turn 360 degrees safely but slowly   Standing Unsupported, Alternately Place Feet on Step/Stool Able to complete 4 steps  without aid or supervision   Standing Unsupported, One Foot in Front Able to plae foot ahead of the other independently and hold 30 seconds   Standing on One Leg Tries to lift leg/unable to hold 3 seconds but remains standing independently   Total Score 48     Dynamic Gait Index   Level Surface Mild Impairment   Change in Gait Speed Mild Impairment   Gait with Horizontal Head Turns Moderate Impairment   Gait with Vertical Head Turns Moderate Impairment   Gait and Pivot Turn Normal   Step Over Obstacle Mild Impairment  Step Around Obstacles Normal   Steps Mild Impairment   Total Score 16     Timed Up and Go Test   Normal TUG (seconds) 14.84                           PT Education - 11/10/16 0932    Education provided Yes   Education Details clinical findings, POC, goals of care   Person(s) Educated Patient;Spouse   Methods Explanation   Comprehension Verbalized understanding             PT Long Term Goals - 11/10/16 0935      PT LONG TERM GOAL #1   Title verbalize understanding of CVA risk factors/warning signs (Target Date for all LTGs: 12/08/16)   Time 4   Period Weeks   Status New     PT LONG TERM GOAL #2   Title independent with HEP    Status New     PT LONG TERM GOAL #3   Title improve BERG balance score to >/= 52/56 for improved balance    Status New     PT LONG TERM GOAL #4   Title improve timed up and go to < 13 sec for improved function   Status New     PT LONG TERM GOAL #5   Title improve dynamic gait index to >/= 20/24 for improved mobility and decreased fall risk   Status New               Plan - 11/10/16 0933    Clinical Impression Statement Pt presents to OPPT for moderate complexity PT eval for decreased balance and moblity following CVA.  Pt demonstrates decreased strength, balance and gait abnormalities affecting safe, functional mobility.  Pt will benefit from PT to address deficits listed.   Rehab Potential Good    PT Frequency 2x / week   PT Duration 4 weeks   PT Treatment/Interventions ADLs/Self Care Home Management;Cryotherapy;Moist Heat;Neuromuscular re-education;Electrical Stimulation;Balance training;Therapeutic exercise;Therapeutic activities;Functional mobility training;Stair training;Gait training;Patient/family education;Vestibular   PT Next Visit Plan establish HEP for balance and strengthening, CVA ed, give handout on fatigue after CVA   Recommended Other Services ? speech consult; will monitor for need and pt/wife to discuss with PCP at 12/15 visit   Consulted and Agree with Plan of Care Patient;Family member/caregiver   Family Member Consulted wife      Patient will benefit from skilled therapeutic intervention in order to improve the following deficits and impairments:  Abnormal gait, Decreased balance, Decreased mobility, Difficulty walking, Decreased strength  Visit Diagnosis: Other abnormalities of gait and mobility - Plan: PT plan of care cert/re-cert  Muscle weakness (generalized) - Plan: PT plan of care cert/re-cert  Unsteadiness on feet - Plan: PT plan of care cert/re-cert      G-Codes - 03/00/92 3300    Functional Assessment Tool Used clinical judgement; BERG, DGI, TUG   Functional Limitation Mobility: Walking and moving around   Mobility: Walking and Moving Around Current Status (T6226) At least 20 percent but less than 40 percent impaired, limited or restricted   Mobility: Walking and Moving Around Goal Status [id number]) At least 1 percent but less than 20 percent impaired, limited or restricted       Problem List Patient Active Problem List   Diagnosis Date Noted  . Stroke ([place]) 10/26/2016  . Obese 05/07/2014  . Expected blood loss anemia 05/07/2014  . S/P right TKA 05/06/2014  . DJD (degenerative  joint disease) of knee 03/25/2014  . Preop cardiovascular exam 03/19/2014  . Abnormal ECG 03/19/2014  . Hypertension   . Diabetes mellitus without complication ([place])    . Hypercholesteremia   . Arthritis   . Prostate cancer ([place])   . Sleep apnea       Laureen Abrahams, PT, DPT 11/10/16 9:40 AM    [facility] [address] [place], [place], [address] Phone: [phone]   Fax:  [phone]  Name: GEORGI NAVARRETE MRN: [id number] Date of Birth: 10-10-1934

## 2016-11-11 DIAGNOSIS — E119 Type 2 diabetes mellitus without complications: Secondary | ICD-10-CM | POA: Diagnosis not present

## 2016-11-11 DIAGNOSIS — R0683 Snoring: Secondary | ICD-10-CM | POA: Diagnosis not present

## 2016-11-11 DIAGNOSIS — I10 Essential (primary) hypertension: Secondary | ICD-10-CM | POA: Diagnosis not present

## 2016-11-11 DIAGNOSIS — I480 Paroxysmal atrial fibrillation: Secondary | ICD-10-CM | POA: Diagnosis not present

## 2016-11-14 ENCOUNTER — Encounter: Payer: Self-pay | Admitting: Physical Therapy

## 2016-11-14 ENCOUNTER — Ambulatory Visit: Payer: Medicare Other | Admitting: Physical Therapy

## 2016-11-14 VITALS — HR 70

## 2016-11-14 DIAGNOSIS — R2689 Other abnormalities of gait and mobility: Secondary | ICD-10-CM | POA: Diagnosis not present

## 2016-11-14 DIAGNOSIS — R2681 Unsteadiness on feet: Secondary | ICD-10-CM | POA: Diagnosis not present

## 2016-11-14 DIAGNOSIS — M6281 Muscle weakness (generalized): Secondary | ICD-10-CM | POA: Diagnosis not present

## 2016-11-14 NOTE — Therapy (Signed)
[facility] [address] [place], [place], [address] Phone: [phone]   Fax:  [phone]  Physical Therapy Treatment  Patient Details  Name: William [name] MRN: [id number] Date of Birth: June 07, 1934 Referring Provider: Jonetta Osgood (hospitalist); PCP: Jani Gravel, MD  Encounter Date: 11/14/2016      PT End of Session - 11/14/16 1317    Visit Number 2   Number of Visits 9   Date for PT Re-Evaluation 12/08/16   Authorization Type Medicare   PT Start Time 1238   PT Stop Time 1316   PT Time Calculation (min) 38 min   Equipment Utilized During Treatment Gait belt   Activity Tolerance Patient tolerated treatment well   Behavior During Therapy [facility] for tasks assessed/performed      Past Medical History:  Diagnosis Date  . Arthritis   . Diabetes mellitus without complication ([place])   . Hypercholesteremia   . Hypertension   . Prostate cancer [facility])    with radiation treatment  . Sleep apnea 2/14   "mild per patient" hasnt gotten CPAP machine    Past Surgical History:  Procedure Laterality Date  . ADENOIDECTOMY    . FLEXIBLE SIGMOIDOSCOPY N/A 09/12/2013   Procedure: FLEXIBLE SIGMOIDOSCOPY;  Surgeon: Milus Banister, MD;  Location: [facility];  Service: Endoscopy;  Laterality: N/A;  . HERNIA REPAIR    . HOT HEMOSTASIS N/A 09/12/2013   Procedure: HOT HEMOSTASIS (ARGON PLASMA COAGULATION/BICAP);  Surgeon: Milus Banister, MD;  Location: [facility];  Service: Endoscopy;  Laterality: N/A;  . LIPOMA EXCISION    . OTHER SURGICAL HISTORY     s/p prostate radiation  . TONSILLECTOMY    . TOTAL KNEE ARTHROPLASTY Left 03/25/2014   Procedure: LEFT TOTAL KNEE ARTHROPLASTY;  Surgeon: [name], MD;  Location: [facility];  Service: Orthopedics;  Laterality: Left;  . TOTAL KNEE ARTHROPLASTY Right 05/06/2014   Procedure: RIGHT TOTAL KNEE ARTHROPLASTY;  Surgeon: [name], MD;  [facility]: [facility];  Service: Orthopedics;   Laterality: Right;    Vitals:   11/14/16 1240  Pulse: 70  SpO2: 97%        Subjective Assessment - 11/14/16 1235    Subjective Pt plans to get back into Tai Chi in about a month.  Used to walk 45 min around the neighborhood 4-5 years ago.   Pertinent History arthritis, DM, HLD, HTN, a fib   Patient Stated Goals return to tai chi, get back to regular activity   Currently in Pain? No/denies                         [facility] Adult PT Treatment/Exercise - 11/14/16 0001      Ambulation/Gait   Ambulation/Gait Yes   Ambulation/Gait Assistance 6: Modified independent (Device/Increase time)  no LOB, worked on changes in speed, direction and visual scanning   Ambulation Distance (Feet) 400 Feet   Assistive device None   Gait Pattern Lateral hip instability;Trendelenburg   Ambulation Surface Level;Indoor     Exercises   Exercises Knee/Hip  Performed hip strengthening- ABD, ADD, Flex, Ext with red theraband (only had time to do with R LE) 10x each, cues for technique.  See pt instruction for detail.     Knee/Hip Exercises: Aerobic   Stepper Sci Fit seated- Level 4.0 to 6.0 back to 5.0 at 5 min maintained speed around 50 RPM, 10 min total  PT Education - 11/14/16 1330    Education provided Yes   Education Details HEP for standing hip strengthening.   Person(s) Educated Patient   Methods Explanation;Demonstration;Verbal cues;Handout   Comprehension Verbalized understanding;Returned demonstration;Need further instruction             PT Long Term Goals - 11/10/16 0935      PT LONG TERM GOAL #1   Title verbalize understanding of CVA risk factors/warning signs (Target Date for all LTGs: 12/08/16)   Time 4   Period Weeks   Status New     PT LONG TERM GOAL #2   Title independent with HEP    Status New     PT LONG TERM GOAL #3   Title improve BERG balance score to >/= 52/56 for improved balance    Status New     PT LONG TERM GOAL #4    Title improve timed up and go to < 13 sec for improved function   Status New     PT LONG TERM GOAL #5   Title improve dynamic gait index to >/= 20/24 for improved mobility and decreased fall risk   Status New               Plan - 11/14/16 1331    Clinical Impression Statement Initiated HEP for standing hip strengthening with red theraband.  Pt reports endurance was challenged with Scit Fit seated stepper activity followed by gait training but pt had no LOB or shuffling gait.   Rehab Potential Good   PT Frequency 2x / week   PT Duration 4 weeks   PT Treatment/Interventions ADLs/Self Care Home Management;Cryotherapy;Moist Heat;Neuromuscular re-education;Electrical Stimulation;Balance training;Therapeutic exercise;Therapeutic activities;Functional mobility training;Stair training;Gait training;Patient/family education;Vestibular   PT Next Visit Plan Review HEP for balance and strengthening, CVA ed, give handout on fatigue after CVA   Consulted and Agree with Plan of Care Patient;Family member/caregiver   Family Member Consulted wife      Patient will benefit from skilled therapeutic intervention in order to improve the following deficits and impairments:  Abnormal gait, Decreased balance, Decreased mobility, Difficulty walking, Decreased strength  Visit Diagnosis: Other abnormalities of gait and mobility  Muscle weakness (generalized)  Unsteadiness on feet     Problem List Patient Active Problem List   Diagnosis Date Noted  . Stroke ([place]) 10/26/2016  . Obese 05/07/2014  . Expected blood loss anemia 05/07/2014  . S/P right TKA 05/06/2014  . DJD (degenerative joint disease) of knee 03/25/2014  . Preop cardiovascular exam 03/19/2014  . Abnormal ECG 03/19/2014  . Hypertension   . Diabetes mellitus without complication ([place])   . Hypercholesteremia   . Arthritis   . Prostate cancer ([place])   . Sleep apnea     Bjorn Loser, PTA  11/14/16, 1:38 PM [facility] [address] [place], [place], [address] Phone: [phone]   Fax:  [phone]  Name: SHARON STAPEL MRN: [id number] Date of Birth: [birth year]

## 2016-11-14 NOTE — Patient Instructions (Addendum)
    Copyright  VHI. All rights reserved.  Strengthening: Hip Abduction - Resisted    With tubing around right leg, other side toward anchor, extend leg out from side. Repeat _10___ times per set. Do _1___ sets per session. Do _1-2___ sessions per day. Strengthening: Hip Extension - Resisted    With tubing around right ankle, face anchor and pull leg straight back. Repeat __10__ times per set. Do 1____ sets per session. Do _1-2___ sessions per day.  http://orth.exer.us/636   http://orth.exer.us/634   Copyright  VHI. All rights reserved.  Strengthening: Hip Adduction - Resisted    With tubing around left leg, bring leg across body. Repeat __10__ times per set. Do _1___ sets per session. Do __1-2__ sessions per day.  http://orth.exer.us/632   Copyright  VHI. All rights reserved.   Copyright  VHI. All rights reserved.  Strengthening: Hip Flexion - Resisted    With tubing around left ankle, anchor behind, bring leg forward, keeping knee straight. Repeat __10__ times per set. Do ___1_ sets per session. Do _1-2___ sessions per day.  http://orth.exer.us/638   Copyright  VHI. All rights reserved.

## 2016-11-15 DIAGNOSIS — Z136 Encounter for screening for cardiovascular disorders: Secondary | ICD-10-CM | POA: Diagnosis not present

## 2016-11-17 DIAGNOSIS — I639 Cerebral infarction, unspecified: Secondary | ICD-10-CM | POA: Diagnosis not present

## 2016-11-17 DIAGNOSIS — I4891 Unspecified atrial fibrillation: Secondary | ICD-10-CM | POA: Diagnosis not present

## 2016-11-18 ENCOUNTER — Encounter: Payer: Self-pay | Admitting: Physical Therapy

## 2016-11-18 ENCOUNTER — Ambulatory Visit: Payer: Medicare Other | Admitting: Physical Therapy

## 2016-11-18 VITALS — BP 170/84

## 2016-11-18 DIAGNOSIS — R2681 Unsteadiness on feet: Secondary | ICD-10-CM | POA: Diagnosis not present

## 2016-11-18 DIAGNOSIS — M6281 Muscle weakness (generalized): Secondary | ICD-10-CM | POA: Diagnosis not present

## 2016-11-18 DIAGNOSIS — R2689 Other abnormalities of gait and mobility: Secondary | ICD-10-CM

## 2016-11-18 NOTE — Therapy (Signed)
[facility] [address] [place], [place], [address] Phone: [phone]   Fax:  [phone]  Physical Therapy Treatment  Patient Details  Name: GUILLERMO BRUNKHORST MRN: [id number] Date of Birth: Jul 26, 1934 Referring Provider: Jonetta Osgood (hospitalist); PCP: Jani Gravel, MD  Encounter Date: 11/18/2016      PT End of Session - 11/18/16 0833    Visit Number 3   Number of Visits 9   Date for PT Re-Evaluation 12/08/16   Authorization Type Medicare   PT Start Time 0801   PT Stop Time 0840   PT Time Calculation (min) 39 min   Activity Tolerance Patient tolerated treatment well   Behavior During Therapy [facility] for tasks assessed/performed      Past Medical History:  Diagnosis Date  . Arthritis   . Diabetes mellitus without complication ([place])   . Hypercholesteremia   . Hypertension   . Prostate cancer [facility])    with radiation treatment  . Sleep apnea 2/14   "mild per patient" hasnt gotten CPAP machine    Past Surgical History:  Procedure Laterality Date  . ADENOIDECTOMY    . FLEXIBLE SIGMOIDOSCOPY N/A 09/12/2013   Procedure: FLEXIBLE SIGMOIDOSCOPY;  Surgeon: Milus Banister, MD;  [facility];  Service: Endoscopy;  Laterality: N/A;  . HERNIA REPAIR    . HOT HEMOSTASIS N/A 09/12/2013   Procedure: HOT HEMOSTASIS (ARGON PLASMA COAGULATION/BICAP);  Surgeon: Milus Banister, MD;  Location: [facility];  Service: Endoscopy;  Laterality: N/A;  . LIPOMA EXCISION    . OTHER SURGICAL HISTORY     s/p prostate radiation  . TONSILLECTOMY    . TOTAL KNEE ARTHROPLASTY Left 03/25/2014   Procedure: LEFT TOTAL KNEE ARTHROPLASTY;  Surgeon: [name], MD;  [facility];  Service: Orthopedics;  Laterality: Left;  . TOTAL KNEE ARTHROPLASTY Right 05/06/2014   Procedure: RIGHT TOTAL KNEE ARTHROPLASTY;  Surgeon: [name], MD;  [facility];  Service: Orthopedics;  Laterality: Right;    Vitals:   11/18/16 0806   BP: (!) 170/84        Subjective Assessment - 11/18/16 0806    Subjective got a parking violation yesterday so is stressed about having to take care of that.  doing well otherwise.  exercises were challenging.                         [facility] Adult PT Treatment/Exercise - 11/18/16 0820      Self-Care   Self-Care Other Self-Care Comments   Other Self-Care Comments  educated on CVA risk factors/warning signs and feeling fatigued after CVA     Knee/Hip Exercises: Aerobic   Stepper Sci Fit seated- Level 5.0 x 10 min total ; goal to keep RPM > 40     Knee/Hip Exercises: Standing   Hip Flexion Both;10 reps;Knee straight   Hip Flexion Limitations red theraband   Hip ADduction Both;10 reps   Hip ADduction Limitations red theraband   Hip Abduction Both;10 reps;Knee straight   Abduction Limitations red theraband   Hip Extension Both;10 reps;Knee straight   Extension Limitations red theraband   Other Standing Knee Exercises mod cues needed for all standing exercises                PT Education - 11/18/16 0832    Education provided Yes   Education Details see self care   Person(s) Educated Patient   Methods Explanation;Handout   Comprehension Verbalized  understanding             PT Long Term Goals - 11/18/16 [id number]      PT LONG TERM GOAL #1   Title verbalize understanding of CVA risk factors/warning signs (Target Date for all LTGs: 12/08/16)   Status On-going     PT LONG TERM GOAL #2   Title independent with HEP    Status On-going     PT LONG TERM GOAL #3   Title improve BERG balance score to >/= 52/56 for improved balance    Status On-going     PT LONG TERM GOAL #4   Title improve timed up and go to < 13 sec for improved function   Status On-going     PT LONG TERM GOAL #5   Title improve dynamic gait index to >/= 20/24 for improved mobility and decreased fall risk   Status On-going               Plan - 11/18/16 [id number]    Clinical  Impression Statement Pt needed mod cues to perform HEP correctly so unsure how compliant pt is with HEP at home.  Will monitor to see if pt needs simpler exercises.  Will conitnue to benefit from PT to maximize function.   PT Treatment/Interventions ADLs/Self Care Home Management;Cryotherapy;Moist Heat;Neuromuscular re-education;Electrical Stimulation;Balance training;Therapeutic exercise;Therapeutic activities;Functional mobility training;Stair training;Gait training;Patient/family education;Vestibular   PT Next Visit Plan continue strengthening, gait training, balance   Consulted and Agree with Plan of Care Patient      Patient will benefit from skilled therapeutic intervention in order to improve the following deficits and impairments:  Abnormal gait, Decreased balance, Decreased mobility, Difficulty walking, Decreased strength  Visit Diagnosis: Other abnormalities of gait and mobility  Muscle weakness (generalized)  Unsteadiness on feet     Problem List Patient Active Problem List   Diagnosis Date Noted  . Stroke ([place]) 10/26/2016  . Obese 05/07/2014  . Expected blood loss anemia 05/07/2014  . S/P right TKA 05/06/2014  . DJD (degenerative joint disease) of knee 03/25/2014  . Preop cardiovascular exam 03/19/2014  . Abnormal ECG 03/19/2014  . Hypertension   . Diabetes mellitus without complication ([place])   . Hypercholesteremia   . Arthritis   . Prostate cancer ([place])   . Sleep apnea        Laureen Abrahams, PT, DPT 11/18/16 8:41 AM    [facility] [address] [place], [place], [address] Phone: [phone]   Fax:  [phone]  Name: ALVEY CHUKWU MRN: [id number] Date of Birth: [birth year]

## 2016-11-23 ENCOUNTER — Ambulatory Visit: Payer: Medicare Other | Admitting: Physical Therapy

## 2016-11-23 ENCOUNTER — Encounter: Payer: Self-pay | Admitting: Physical Therapy

## 2016-11-23 DIAGNOSIS — R2689 Other abnormalities of gait and mobility: Secondary | ICD-10-CM | POA: Diagnosis not present

## 2016-11-23 DIAGNOSIS — M6281 Muscle weakness (generalized): Secondary | ICD-10-CM

## 2016-11-23 DIAGNOSIS — R2681 Unsteadiness on feet: Secondary | ICD-10-CM | POA: Diagnosis not present

## 2016-11-23 NOTE — Therapy (Signed)
[facility] [address] [place], [place], [address] Phone: [phone]   Fax:  [phone]  Physical Therapy Treatment  Patient Details  Name: William [name] MRN: [id number] Date of Birth: 1934-01-14 Referring Provider: Jonetta Osgood (hospitalist); PCP: Jani Gravel, MD  Encounter Date: 11/23/2016      PT End of Session - 11/23/16 0930    Visit Number 4   Number of Visits 9   Date for PT Re-Evaluation 12/08/16   Authorization Type Medicare   PT Start Time 0847   PT Stop Time 0930   PT Time Calculation (min) 43 min   Activity Tolerance Patient tolerated treatment well   Behavior During Therapy [facility] for tasks assessed/performed      Past Medical History:  Diagnosis Date  . Arthritis   . Diabetes mellitus without complication ([place])   . Hypercholesteremia   . Hypertension   . Prostate cancer [facility])    with radiation treatment  . Sleep apnea 2/14   "mild per patient" hasnt gotten CPAP machine    Past Surgical History:  Procedure Laterality Date  . ADENOIDECTOMY    . FLEXIBLE SIGMOIDOSCOPY N/A 09/12/2013   Procedure: FLEXIBLE SIGMOIDOSCOPY;  Surgeon: Milus Banister, MD;  [facility];  Service: Endoscopy;  Laterality: N/A;  . HERNIA REPAIR    . HOT HEMOSTASIS N/A 09/12/2013   Procedure: HOT HEMOSTASIS (ARGON PLASMA COAGULATION/BICAP);  Surgeon: Milus Banister, MD;  Location: [facility];  Service: Endoscopy;  Laterality: N/A;  . LIPOMA EXCISION    . OTHER SURGICAL HISTORY     s/p prostate radiation  . TONSILLECTOMY    . TOTAL KNEE ARTHROPLASTY Left 03/25/2014   Procedure: LEFT TOTAL KNEE ARTHROPLASTY;  Surgeon: [name], MD;  [facility];  Service: Orthopedics;  Laterality: Left;  . TOTAL KNEE ARTHROPLASTY Right 05/06/2014   Procedure: RIGHT TOTAL KNEE ARTHROPLASTY;  Surgeon: [name], MD;  [facility];  Service: Orthopedics;  Laterality: Right;    There were no vitals  filed for this visit.      Subjective Assessment - 11/23/16 0849    Subjective Wife checks pt's BP at home and it seems to be fine. Pt has been working on hip exercises at home.                         [facility] Adult PT Treatment/Exercise - 11/23/16 0001      Knee/Hip Exercises: Supine   Bridges Limitations 2x5; stopped multiple times to stretch LEs due to cramping.           PWR [facility]) - 11/23/16 0853 STANDING   PWR! Up x20   PWR! Rock [employer] Twist x20   PWR Step x20   Basic 4 Flow x20   Comments cues for technique.          Balance Exercises - 11/23/16 0930      Balance Exercises: Standing   Stepping Strategy Anterior;Posterior;Foam/compliant surface  min guard to supervision, cues for body mechanics.           PT Education - 11/23/16 0909    Education provided Yes   Education Details PWR! Moves standing for HEP   Person(s) Educated Patient   Methods Explanation;Demonstration;Verbal cues;Handout   Comprehension Verbalized understanding;Returned demonstration;Verbal cues required             PT Long Term Goals - 11/18/16 [id number]  PT LONG TERM GOAL #1   Title verbalize understanding of CVA risk factors/warning signs (Target Date for all LTGs: 12/08/16)   Status On-going     PT LONG TERM GOAL #2   Title independent with HEP    Status On-going     PT LONG TERM GOAL #3   Title improve BERG balance score to >/= 52/56 for improved balance    Status On-going     PT LONG TERM GOAL #4   Title improve timed up and go to < 13 sec for improved function   Status On-going     PT LONG TERM GOAL #5   Title improve dynamic gait index to >/= 20/24 for improved mobility and decreased fall risk   Status On-going               Plan - 11/23/16 0918    Clinical Impression Statement Progressed HEP to include PWR! Moves standing for balance and activity tolerance.  Pt performed Hip strengthening exercises including bridging; note pt  having difficulty due to bilateral cramping.   PT Treatment/Interventions ADLs/Self Care Home Management;Cryotherapy;Moist Heat;Neuromuscular re-education;Electrical Stimulation;Balance training;Therapeutic exercise;Therapeutic activities;Functional mobility training;Stair training;Gait training;Patient/family education;Vestibular   PT Next Visit Plan continue strengthening ( BRIDGING), gait training, balance   Consulted and Agree with Plan of Care Patient      Patient will benefit from skilled therapeutic intervention in order to improve the following deficits and impairments:  Abnormal gait, Decreased balance, Decreased mobility, Difficulty walking, Decreased strength  Visit Diagnosis: Other abnormalities of gait and mobility  Muscle weakness (generalized)  Unsteadiness on feet     Problem List Patient Active Problem List   Diagnosis Date Noted  . Stroke ([place]) 10/26/2016  . Obese 05/07/2014  . Expected blood loss anemia 05/07/2014  . S/P right TKA 05/06/2014  . DJD (degenerative joint disease) of knee 03/25/2014  . Preop cardiovascular exam 03/19/2014  . Abnormal ECG 03/19/2014  . Hypertension   . Diabetes mellitus without complication ([place])   . Hypercholesteremia   . Arthritis   . Prostate cancer ([place])   . Sleep apnea     Bjorn Loser, PTA  11/23/16, 12:43 PM [facility] [address] [place], [place], [address] Phone: [phone]   Fax:  [phone]  Name: William [name] MRN: [id number] Date of Birth: 04/18/1934

## 2016-11-30 ENCOUNTER — Ambulatory Visit: Payer: Medicare Other | Admitting: Physical Therapy

## 2016-12-02 ENCOUNTER — Ambulatory Visit: Payer: Medicare Other | Attending: Internal Medicine | Admitting: Physical Therapy

## 2016-12-02 DIAGNOSIS — R2689 Other abnormalities of gait and mobility: Secondary | ICD-10-CM | POA: Diagnosis not present

## 2016-12-02 DIAGNOSIS — R2681 Unsteadiness on feet: Secondary | ICD-10-CM | POA: Diagnosis not present

## 2016-12-02 DIAGNOSIS — M6281 Muscle weakness (generalized): Secondary | ICD-10-CM | POA: Diagnosis not present

## 2016-12-02 NOTE — Therapy (Signed)
[facility] [address] [place], [place], [address] Phone: [phone]   Fax:  [phone]  Physical Therapy Treatment  Patient Details  Name: William [name] MRN: [id number] Date of Birth: October 11, 1934 Referring Provider: Jonetta [place]sg[place] (hospitalist); PCP: Jani Gravel, MD  Encounter Date: 12/02/2016      PT End of Session - 12/02/16 0856    Visit Number 5   Number of Visits 9   Date for PT Re-Evaluation 12/08/16   Authorization Type Medicare   PT Start Time 0802   PT Stop Time 0850   PT Time Calculation (min) 48 min   Activity Tolerance Patient tolerated treatment well   Behavior During Therapy [facility] for tasks assessed/performed      Past Medical History:  Diagnosis Date  . Arthritis   . Diabetes mellitus without complication ([place])   . Hypercholesteremia   . Hypertension   . Prostate cancer [facility])    with radiation treatment  . Sleep apnea 2/14   "mild per patient" hasnt gotten CPAP machine    Past Surgical History:  Procedure Laterality Date  . ADENOIDECTOMY    . FLEXIBLE SIGMOIDOSCOPY N/A 09/12/2013   Procedure: FLEXIBLE SIGMOIDOSCOPY;  Surgeon: Milus Banister, MD;  Location: [facility] ENDOSCOPY;  Service: Endoscopy;  Laterality: N/A;  . HERNIA REPAIR    . HOT HEMOSTASIS N/A 09/12/2013   Procedure: HOT HEMOSTASIS (ARGON PLASMA COAGULATION/BICAP);  Surgeon: Milus Banister, MD;  Location: [facility];  Service: Endoscopy;  Laterality: N/A;  . LIPOMA EXCISION    . OTHER SURGICAL HISTORY     s/p prostate radiation  . TONSILLECTOMY    . TOTAL KNEE ARTHROPLASTY Left 03/25/2014   Procedure: LEFT TOTAL KNEE ARTHROPLASTY;  Surgeon: [name], MD;  Location: [facility] ORS;  Service: Orthopedics;  Laterality: Left;  . TOTAL KNEE ARTHROPLASTY Right 05/06/2014   Procedure: RIGHT TOTAL KNEE ARTHROPLASTY;  Surgeon: [name], MD;  Location: [facility] OR[facility];  Service: Orthopedics;  Laterality: Right;    There were no vitals filed  for this visit.      Subjective Assessment - 12/02/16 0807    Subjective Pt reports he did not get to do his exercises this week because he made a trip to [facility], has a cold and has had to stay at his daughter's house because his furnace went out.  Pt wishes to start with HEP and "take it easy" today due to feeling weak.   Pertinent History arthritis, DM, HLD, HTN, a fib   Patient Stated Goals return to tai chi, get back to regular activity   Currently in Pain? No/denies                         [facility] Adult PT Treatment/Exercise - 12/02/16 0814      Knee/Hip Exercises: Standing   Hip Flexion Both;10 reps;Knee straight   Hip Flexion Limitations red theraband   Hip ADduction Both;10 reps   Hip ADduction Limitations red theraband   Hip Abduction Both;10 reps;Knee straight   Abduction Limitations red theraband   Hip Extension Both;10 reps;Knee straight   Extension Limitations red theraband     Knee/Hip Exercises: Supine   Bridges Limitations 2x5 with LE propped on peanut ball   Bridges with Clamshell 2 sets;5 reps  red theraband             Balance Exercises - 12/02/16 0833      Balance Exercises: Standing  Sidestepping Theraband  red theraband; 10'x2reps each direction   Other Standing Exercises Performed Tai Chi Opening sequence x 2 standing on red compliant mat with close supervision during stepping           PT Education - 12/02/16 0855    Education provided Yes   Education Details Reviewed HEP and correct body mechanics; assessed pt safety and balance with Tai Chi   Person(s) Educated Patient   Methods Explanation;Demonstration   Comprehension Verbalized understanding;Returned demonstration             PT Long Term Goals - 11/18/16 9024      PT LONG TERM GOAL #1   Title verbalize understanding of CVA risk factors/warning signs (Target Date for all LTGs: 12/08/16)   Status On-going     PT LONG TERM GOAL #2   Title independent  with HEP    Status On-going     PT LONG TERM GOAL #3   Title improve BERG balance score to >/= 52/56 for improved balance    Status On-going     PT LONG TERM GOAL #4   Title improve timed up and go to < 13 sec for improved function   Status On-going     PT LONG TERM GOAL #5   Title improve dynamic gait index to >/= 20/24 for improved mobility and decreased fall risk   Status On-going               Plan - 12/02/16 0901    Clinical Impression Statement Pt continues to require verbal and tactile cues for effective performance of HEP.  Initiated [place] today with pt performing at supervision level.  Pt would be able to return to [place] when he feels ready and is able to transport himself.  Plan to begin to re-assess pt progress and goals at next visit and determine if additional visits are needed to continue to address balance, gait and strength deficits.     PT Treatment/Interventions ADLs/Self Care Home Management;Cryotherapy;Moist Heat;Neuromuscular re-education;Electrical Stimulation;Balance training;Therapeutic exercise;Therapeutic activities;Functional mobility training;Stair training;Gait training;Patient/family education;Vestibular   PT Next Visit Plan Reassessment of outcome measures: BERG, TUG and DGI.  Check patient's independence with HEP   Consulted and Agree with Plan of Care Patient      Patient will benefit from skilled therapeutic intervention in order to improve the following deficits and impairments:  Abnormal gait, Decreased balance, Decreased mobility, Difficulty walking, Decreased strength  Visit Diagnosis: Other abnormalities of gait and mobility  Muscle weakness (generalized)  Unsteadiness on feet     Problem List Patient Active Problem List   Diagnosis Date Noted  . Stroke ([place]) 10/26/2016  . Obese 05/07/2014  . Expected blood loss anemia 05/07/2014  . S/P right TKA 05/06/2014  . DJD (degenerative joint disease) of knee 03/25/2014   . Preop cardiovascular exam 03/19/2014  . Abnormal ECG 03/19/2014  . Hypertension   . Diabetes mellitus without complication ([place])   . Hypercholesteremia   . Arthritis   . Prostate cancer ([place])   . Sleep apnea     Laureen Abrahams, PT, DPT 12/02/16 9:09 AM  [facility] [address] [place], [place], [address] Phone: [phone]   Fax:  [phone]  Name: William [name] MRN: [id number] Date of Birth: 11-04-1934

## 2016-12-07 ENCOUNTER — Encounter: Payer: Self-pay | Admitting: Physical Therapy

## 2016-12-07 ENCOUNTER — Ambulatory Visit: Payer: Medicare Other | Admitting: Physical Therapy

## 2016-12-07 DIAGNOSIS — R2681 Unsteadiness on feet: Secondary | ICD-10-CM | POA: Diagnosis not present

## 2016-12-07 DIAGNOSIS — M6281 Muscle weakness (generalized): Secondary | ICD-10-CM

## 2016-12-07 DIAGNOSIS — R2689 Other abnormalities of gait and mobility: Secondary | ICD-10-CM

## 2016-12-07 NOTE — Therapy (Signed)
[facility] [address] [place], [place], [address] Phone: [phone]   Fax:  [phone]  Physical Therapy Treatment  Patient Details  Name: William [name] MRN: [id number] Date of Birth: 1934-10-06 Referring Provider: Jonetta Osgood (hospitalist); PCP: Jani Gravel, MD  Encounter Date: 12/07/2016      PT End of Session - 12/07/16 0929    Visit Number 6   Number of Visits 9   Date for PT Re-Evaluation 12/08/16   Authorization Type Medicare   PT Start Time 0845   PT Stop Time 0929   PT Time Calculation (min) 44 min   Activity Tolerance Patient tolerated treatment well   Behavior During Therapy [facility] for tasks assessed/performed      Past Medical History:  Diagnosis Date  . Arthritis   . Diabetes mellitus without complication ([place])   . Hypercholesteremia   . Hypertension   . Prostate cancer [facility])    with radiation treatment  . Sleep apnea 2/14   "mild per patient" hasnt gotten CPAP machine    Past Surgical History:  Procedure Laterality Date  . ADENOIDECTOMY    . FLEXIBLE SIGMOIDOSCOPY N/A 09/12/2013   Procedure: FLEXIBLE SIGMOIDOSCOPY;  Surgeon: Milus Banister, MD;  [facility];  Service: Endoscopy;  Laterality: N/A;  . HERNIA REPAIR    . HOT HEMOSTASIS N/A 09/12/2013   Procedure: HOT HEMOSTASIS (ARGON PLASMA COAGULATION/BICAP);  Surgeon: Milus Banister, MD;  Location: [facility];  Service: Endoscopy;  Laterality: N/A;  . LIPOMA EXCISION    . OTHER SURGICAL HISTORY     s/p prostate radiation  . TONSILLECTOMY    . TOTAL KNEE ARTHROPLASTY Left 03/25/2014   Procedure: LEFT TOTAL KNEE ARTHROPLASTY;  Surgeon: [name], MD;  Location: [facility];  Service: Orthopedics;  Laterality: Left;  . TOTAL KNEE ARTHROPLASTY Right 05/06/2014   Procedure: RIGHT TOTAL KNEE ARTHROPLASTY;  Surgeon: [name], MD;  [facility];  Service: Orthopedics;  Laterality: Right;    There were no vitals  filed for this visit.      Subjective Assessment - 12/07/16 0848    Subjective Pt is still at her daughter's due to the furnace not being fixed, and is still feeling sick including diarrhea.   Pertinent History arthritis, DM, HLD, HTN, a fib   Patient Stated Goals return to tai chi, get back to regular activity   Currently in Pain? No/denies            [facility] PT Assessment - 12/07/16 0001      Berg Balance Test   Sit to Stand Able to stand without using hands and stabilize independently   Standing Unsupported Able to stand safely 2 minutes   Sitting with Back Unsupported but Feet Supported on Floor or Stool Able to sit safely and securely 2 minutes   Stand to Sit Sits safely with minimal use of hands   Transfers Able to transfer safely, minor use of hands   Standing Unsupported with Eyes Closed Able to stand 10 seconds safely   Standing Ubsupported with Feet Together Able to place feet together independently and stand 1 minute safely   From Standing, Reach Forward with Outstretched Arm Can reach confidently >25 cm (10")   From Standing Position, Pick up Object from Floor Able to pick up shoe safely and easily   From Standing Position, Turn to Look Behind Over each Shoulder Looks behind from both sides and weight shifts well   Turn  360 Degrees Able to turn 360 degrees safely one side only in 4 seconds or less   Standing Unsupported, Alternately Place Feet on Step/Stool Able to stand independently and safely and complete 8 steps in 20 seconds   Standing Unsupported, One Foot in Front Able to plae foot ahead of the other independently and hold 30 seconds   Standing on One Leg Tries to lift leg/unable to hold 3 seconds but remains standing independently   Total Score 51     Dynamic Gait Index   Level Surface Mild Impairment   Change in Gait Speed Normal   Gait with Horizontal Head Turns Normal   Gait with Vertical Head Turns Normal   Gait and Pivot Turn Mild Impairment   Step Over  Obstacle Normal   Step Around Obstacles Normal   Steps Moderate Impairment   Total Score 20     Timed Up and Go Test   TUG Normal TUG   Normal TUG (seconds) 12.81                     [facility] Adult PT Treatment/Exercise - 12/07/16 0001      Transfers   Five time sit to stand comments  16.35 seconds                PT Education - 12/07/16 1252    Education provided Yes   Education Details Discussed  goals checked, including signs and symptoms of a CVA, renewal and scheduling.   Person(s) Educated Patient   Methods Explanation;Verbal cues   Comprehension Verbalized understanding             PT Long Term Goals - 12/07/16 0929      PT LONG TERM GOAL #1   Title verbalize understanding of CVA risk factors/warning signs (Target Date for all LTGs: 12/08/16)   Baseline Met; 12/07/16.   Status Achieved     PT LONG TERM GOAL #2   Title independent with HEP    Baseline Pt very little performance due to sickness, 12/07/16.   Status Partially Met     PT LONG TERM GOAL #3   Title improve BERG balance score to >/= 52/56 for improved balance    Baseline Scored 51/56, 12/07/16.   Status Not Met     PT LONG TERM GOAL #4   Title improve timed up and go to < 13 sec for improved function   Baseline 12.81 seconds, 12/07/16.   Status Achieved     PT LONG TERM GOAL #5   Title improve dynamic gait index to >/= 20/24 for improved mobility and decreased fall risk   Baseline 20/24 12/07/16.   Status Achieved               Plan - 12/07/16 0927    Clinical Impression Statement Pt has had poor HEP performance due to being sick for multiple weeks, would benefit from continued hip strengthening and updated HEP including standing balance exercises.  Pt demonstrated having to use step- to pattern with one rail with stair negotiation, reporting imbalance.  Pt also demonstrates wide BOS with gait indicating possible imbalance when initiating gait and gait on unlevel surface.  PT Treatment/Interventions ADLs/Self Care Home Management;Cryotherapy;Moist Heat;Neuromuscular re-education;Electrical Stimulation;Balance training;Therapeutic exercise;Therapeutic activities;Functional mobility training;Stair training;Gait training;Patient/family education;Vestibular   PT Next Visit Plan Have pt schedule more appointment; renew.   Consulted and Agree with Plan of Care Patient      Patient will benefit from skilled therapeutic intervention in order to improve the following deficits and impairments:  Abnormal gait, Decreased balance, Decreased mobility, Difficulty walking, Decreased strength  Visit Diagnosis: Other abnormalities of gait and mobility  Muscle weakness (generalized)  Unsteadiness on feet     Problem List Patient Active Problem List   Diagnosis Date Noted  . Stroke ([place]) 10/26/2016  . Obese 05/07/2014  . Expected blood loss anemia 05/07/2014  . S/P right TKA 05/06/2014  . DJD (degenerative joint disease) of knee 03/25/2014  . Preop cardiovascular exam 03/19/2014  . Abnormal ECG 03/19/2014  . Hypertension   . Diabetes mellitus without complication ([place])   . Hypercholesteremia   . Arthritis   . Prostate cancer ([place])   . Sleep apnea    Bjorn Loser, PTA  12/07/16, 1:02 PM [facility] [address] [place], [place], [address] Phone: [phone]   Fax:  [phone]  Name: William [name] MRN: [id number] Date of Birth: 1933/12/27

## 2016-12-07 NOTE — Patient Instructions (Signed)
Stroke Prevention Some medical conditions and behaviors are associated with an increased chance of having a stroke. You may prevent a stroke by making healthy choices and managing medical conditions. How can I reduce my risk of having a stroke?  Stay physically active. Get at least 30 minutes of activity on most or all days.  Do not smoke. It may also be helpful to avoid exposure to secondhand smoke.  Limit alcohol use. Moderate alcohol use is considered to be:  No more than 2 drinks per day for men.  No more than 1 drink per day for nonpregnant women.  Eat healthy foods. This involves:  Eating 5 or more servings of fruits and vegetables a day.  Making dietary changes that address high blood pressure (hypertension), high cholesterol, diabetes, or obesity.  Manage your cholesterol levels.  Making food choices that are high in fiber and low in saturated fat, trans fat, and cholesterol may control cholesterol levels.  Take any prescribed medicines to control cholesterol as directed by your health care provider.  Manage your diabetes.  Controlling your carbohydrate and sugar intake is recommended to manage diabetes.  Take any prescribed medicines to control diabetes as directed by your health care provider.  Control your hypertension.  Making food choices that are low in salt (sodium), saturated fat, trans fat, and cholesterol is recommended to manage hypertension.  Ask your health care provider if you need treatment to lower your blood pressure. Take any prescribed medicines to control hypertension as directed by your health care provider.  If you are 18-39 years of age, have your blood pressure checked every 3-5 years. If you are 40 years of age or older, have your blood pressure checked every year.  Maintain a healthy weight.  Reducing calorie intake and making food choices that are low in sodium, saturated fat, trans fat, and cholesterol are recommended to manage  weight.  Stop drug abuse.  Avoid taking birth control pills.  Talk to your health care provider about the risks of taking birth control pills if you are over 35 years old, smoke, get migraines, or have ever had a blood clot.  Get evaluated for sleep disorders (sleep apnea).  Talk to your health care provider about getting a sleep evaluation if you snore a lot or have excessive sleepiness.  Take medicines only as directed by your health care provider.  For some people, aspirin or blood thinners (anticoagulants) are helpful in reducing the risk of forming abnormal blood clots that can lead to stroke. If you have the irregular heart rhythm of atrial fibrillation, you should be on a blood thinner unless there is a good reason you cannot take them.  Understand all your medicine instructions.  Make sure that other conditions (such as anemia or atherosclerosis) are addressed. Get help right away if:  You have sudden weakness or numbness of the face, arm, or leg, especially on one side of the body.  Your face or eyelid droops to one side.  You have sudden confusion.  You have trouble speaking (aphasia) or understanding.  You have sudden trouble seeing in one or both eyes.  You have sudden trouble walking.  You have dizziness.  You have a loss of balance or coordination.  You have a sudden, severe headache with no known cause.  You have new chest pain or an irregular heartbeat. Any of these symptoms may represent a serious problem that is an emergency. Do not wait to see if the symptoms will go away.   Get medical help at once. Call your local emergency services (911 in [place] Do not drive yourself to the hospital. This information is not intended to replace advice given to you by your health care provider. Make sure you discuss any questions you have with your health care provider. Document Released: 12/22/2004 Document Revised: 04/21/2016 Document Reviewed: 05/17/2013 Elsevier  Interactive Patient Education  2017 [employer]  

## 2016-12-08 DIAGNOSIS — Z8673 Personal history of transient ischemic attack (TIA), and cerebral infarction without residual deficits: Secondary | ICD-10-CM | POA: Diagnosis not present

## 2016-12-08 DIAGNOSIS — I77811 Abdominal aortic ectasia: Secondary | ICD-10-CM | POA: Diagnosis not present

## 2016-12-08 DIAGNOSIS — I10 Essential (primary) hypertension: Secondary | ICD-10-CM | POA: Diagnosis not present

## 2016-12-08 DIAGNOSIS — I4891 Unspecified atrial fibrillation: Secondary | ICD-10-CM | POA: Diagnosis not present

## 2016-12-09 ENCOUNTER — Encounter: Payer: Self-pay | Admitting: Physical Therapy

## 2016-12-09 ENCOUNTER — Ambulatory Visit: Payer: Medicare Other | Admitting: Physical Therapy

## 2016-12-09 DIAGNOSIS — R2681 Unsteadiness on feet: Secondary | ICD-10-CM | POA: Diagnosis not present

## 2016-12-09 DIAGNOSIS — R2689 Other abnormalities of gait and mobility: Secondary | ICD-10-CM | POA: Diagnosis not present

## 2016-12-09 DIAGNOSIS — M6281 Muscle weakness (generalized): Secondary | ICD-10-CM

## 2016-12-09 NOTE — Therapy (Signed)
[facility] [address] [place], [place], [address] Phone: [phone]   Fax:  [phone]  Physical Therapy Treatment  Patient Details  Name: William [name] MRN: [id number] Date of Birth: 12/24/1933 Referring Provider: Jonetta Osgood (hospitalist); PCP: Jani Gravel, MD  Encounter Date: 12/09/2016      PT End of Session - 12/09/16 1152    Visit Number 7  G1   Number of Visits 13   Date for PT Re-Evaluation 01/08/17   Authorization Type Medicare   Authorization Time Period 12/09/16 to 02/07/17   PT Start Time 0802   PT Stop Time 0847   PT Time Calculation (min) 45 min   Activity Tolerance Patient limited by fatigue   Behavior During Therapy [facility] for tasks assessed/performed      Past Medical History:  Diagnosis Date  . Arthritis   . Diabetes mellitus without complication ([place])   . Hypercholesteremia   . Hypertension   . Prostate cancer [facility])    with radiation treatment  . Sleep apnea 2/14   "mild per patient" hasnt gotten CPAP machine    Past Surgical History:  Procedure Laterality Date  . ADENOIDECTOMY    . FLEXIBLE SIGMOIDOSCOPY N/A 09/12/2013   Procedure: FLEXIBLE SIGMOIDOSCOPY;  Surgeon: Milus Banister, MD;  Location: [facility];  Service: Endoscopy;  Laterality: N/A;  . HERNIA REPAIR    . HOT HEMOSTASIS N/A 09/12/2013   Procedure: HOT HEMOSTASIS (ARGON PLASMA COAGULATION/BICAP);  Surgeon: Milus Banister, MD;  Location: [facility];  Service: Endoscopy;  Laterality: N/A;  . LIPOMA EXCISION    . OTHER SURGICAL HISTORY     s/p prostate radiation  . TONSILLECTOMY    . TOTAL KNEE ARTHROPLASTY Left 03/25/2014   Procedure: LEFT TOTAL KNEE ARTHROPLASTY;  Surgeon: [name], MD;  Location: [facility];  Service: Orthopedics;  Laterality: Left;  . TOTAL KNEE ARTHROPLASTY Right 05/06/2014   Procedure: RIGHT TOTAL KNEE ARTHROPLASTY;  Surgeon: [name], MD;  Location: [facility];  Service: Orthopedics;   Laterality: Right;    There were no vitals filed for this visit.      Subjective Assessment - 12/09/16 0804    Subjective Feeling much better. Continues to have problems with fatigue and feels this is his most limiting factor. He reports fatigue was not an issue prior to CVA. He was told by MD not to drive for 4 weeks post-CVA, which would put him at end of January to drive. (Spoke with wife after session who reports her concern with driving is 1. his reaction times, 2. not able to find his way or remember where he is going).    Pertinent History arthritis, DM, HLD, HTN, a fib   Patient Stated Goals return to tai chi, get back to regular activity   Currently in Pain? No/denies                         [facility] Adult PT Treatment/Exercise - 12/09/16 0939      Bed Mobility   Bed Mobility Rolling Right;Rolling Left;Left Sidelying to Sit;Sit to Supine   Rolling Right 6: Modified independent (Device/Increase time)  incr time and effort   Rolling Left 6: Modified independent (Device/Increase time)  incr time and effort   Left Sidelying to Sit 6: Modified independent (Device/Increase time)  incr time and effort   Sit to Supine 6: Modified independent (Device/Increase time)  incr time and effort  Transfers   Transfers Sit to Stand;Stand to Sit   Sit to Stand 7: Independent   Stand to Sit 7: Independent     Ambulation/Gait   Ambulation/Gait Assistance 6: Modified independent (Device/Increase time)   Ambulation Distance (Feet) 480 Feet   Assistive device None   Gait Pattern Lateral hip instability;Step-through pattern;Lateral trunk lean to right;Lateral trunk lean to left;Wide base of support   Ambulation Surface Level;Indoor   Gait Comments Patient noted to decr velocity and even stops when holding conversation while walking. Reports this is due to fatigue and dyspnea. Excessive lateral lean bil due to hip weakness (leans rt as advancing LLE; wife reports this was  slightly present since bil TKR, however is worse now).     Knee/Hip Exercises: Standing   Hip Abduction Stengthening;Both;1 set;10 reps;Knee straight   Abduction Limitations educated to stand sideways to counter, 1-2 finger support; hold x 2 seconds focusing on strong glut contraction and upright torso; pt fatigued after 4-5 reps and required tactile cues for posture             Balance Exercises - 12/09/16 0947      Balance Exercises: Standing   Standing Eyes Opened Narrow base of support (BOS);Head turns;Foam/compliant surface;Solid surface  solid x 60 sec; foam x 60 sec; head turns horz and vert x 10   Standing Eyes Closed Narrow base of support (BOS);Wide (BOA);Head turns;Foam/compliant surface;Solid surface  solid x 60 sec; foam x 60 sec; head turns horz and vert x 10           PT Education - 12/09/16 0955    Education provided Yes   Education Details Educated on prioritizing HEP exercises (he has been too fatigued to do them all). Prioritized standing hip abdct for strengthening and increasing walking distance. Educated on PT plan moving forward with goal to return pt to tai chi class at least 1x/week the week of 12/26/16.    Person(s) Educated Patient   Methods Explanation;Demonstration   Comprehension Verbalized understanding;Returned demonstration             PT Long Term Goals - 12/09/16 1206      PT LONG TERM GOAL #1   Title verbalize understanding of CVA risk factors/warning signs (Target Date for all LTGs: 12/08/16)   Baseline Met; 12/07/16.   Status Achieved     PT LONG TERM GOAL #2   Title independent with HEP / upgrade to independent with updated HEP (target 01/08/17)   Baseline Pt very little performance due to sickness, 12/07/16.   Time 4   Period Weeks   Status Revised     PT LONG TERM GOAL #3   Title improve BERG balance score to >/= 52/56 for improved balance    Baseline Scored 51/56, 12/07/16.   Time 4   Status On-going     PT LONG TERM GOAL  #4   Title improve timed up and go to < 13 sec for improved function   Baseline 12.81 seconds, 12/07/16.   Status Achieved     PT LONG TERM GOAL #5   Title improve dynamic gait index to >/= 20/24 for improved mobility and decreased fall risk   Baseline 20/24 12/07/16.   Status Achieved     Additional Long Term Goals   Additional Long Term Goals Yes     PT LONG TERM GOAL #6   Title Patient will demonstrate improved confidence in his balance as demonstrated by successful return to his tai chi class  at least 1 time per week.   Baseline Patient has not returned for any classes (Dec 23, 2016). Previously did tai chi 3x/week x 13 years.   Time 4   Period Weeks   Status New               Plan - 2016-12-23 1159    Clinical Impression Statement Patient presented to Physical Therapy 11/10/16 s/p CVA with balance and gait deficits. Patient has achieved 3 of 5 long-term goals. His recovery has been complicated by several weeks of illness (pt missed an entire week, and then was only able to attend 1 visit per week the following week). Despite his recent illness, he has shown improvement in his balance and gait as evidenced by his improved functional outcome measures. Berg Balance 48/56 to 52/56; Timed up and Go 14.84 sec to 12.81 sec; Dynamic Gait Index 16/24 to 20/24. Patient is highly motivated to improve his balance and gait to decrease his fall risk and increase ability to access the community. Long-term goals updated and will seek re-certification for 4 additional weeks of PT.   PT Treatment/Interventions ADLs/Self Care Home Management;Cryotherapy;Moist Heat;Neuromuscular re-education;Electrical Stimulation;Balance training;Therapeutic exercise;Therapeutic activities;Functional mobility training;Stair training;Gait training;Patient/family education;Vestibular   PT Next Visit Plan Print and give exercises documented in pt instructions 23-Dec-2022 [employer] Hawking forgot); 6 MWT (pt continues to complain of fatigue  limiting his activity); hip abductor strengthening!!   Consulted and Agree with Plan of Care Patient      Patient will benefit from skilled therapeutic intervention in order to improve the following deficits and impairments:  Abnormal gait, Decreased balance, Decreased mobility, Difficulty walking, Decreased strength, Decreased activity tolerance  Visit Diagnosis: Other abnormalities of gait and mobility - Plan: PT plan of care cert/re-cert  Muscle weakness (generalized) - Plan: PT plan of care cert/re-cert       G-Codes - 12/23/16 02/17/1208    Functional Assessment Tool Used clinical judgement; BERG, DGI, TUG   Functional Limitation Mobility: Walking and moving around   Mobility: Walking and Moving Around Current Status [phone]) At least 20 percent but less than 40 percent impaired, limited or restricted   Mobility: Walking and Moving Around Goal Status [phone]) At least 1 percent but less than 20 percent impaired, limited or restricted      Problem List Patient Active Problem List   Diagnosis Date Noted  . Stroke ([place]) 10/26/2016  . Obese 05/07/2014  . Expected blood loss anemia 05/07/2014  . S/P right TKA 05/06/2014  . DJD (degenerative joint disease) of knee 03/25/2014  . Preop cardiovascular exam 03/19/2014  . Abnormal ECG 03/19/2014  . Hypertension   . Diabetes mellitus without complication ([place])   . Hypercholesteremia   . Arthritis   . Prostate cancer ([place])   . Sleep apnea     Rexanne Mano, PT 23-Dec-2016, 3:55 PM  [facility] [address] [place], [place], [address] Phone: [phone]   Fax:  [phone]  Name: KIRSTEN MCKONE MRN: [id number] Date of Birth: August 27, 1934

## 2016-12-09 NOTE — Patient Instructions (Addendum)
ABDUCTION: Standing - Resistance Band (Active)    Stand sideways at counter, feet flat (NO band around legs). Use 1-2 fingers on counter. Lift right leg out to side. Hold 2 counts. Complete _2__ sets of _10__ repetitions. Perform _1-2__ sessions per day. Do 5 days per week.  When able to do 10 reps with 2 second hold, may begin using red band for increase strengthening.   Copyright  VHI. All rights reserved.       ** Always do these exercises while standing in a corner with your back to the walls and a chair in front of you (for arm support if needed).  EXERCISES WITH EYES OPEN   Balance: Eyes Open - Bilateral (Varied Surfaces)    Stand on compliant surface: foam/pillows, feet shoulder width, eyes open. Maintain balance 30 seconds. Repeat 1 times per set. Do 1 sets per session. Do 5 sessions per week.       Feet Apart (Compliant Surface) Head Motion - Eyes Open    With eyes open, standing on compliant surface: feet shoulder width apart, move head slowly: left and right x 10 reps; up and down x 10 reps  Do 1 sessions per day. Do 5 days per week.  Copyright  VHI. All rights reserved.     Feet Together (Compliant Surface) Head Motion - Eyes Open    With eyes open, standing on compliant surface: feet together, move head slowly: right and left x 10; up and down x 10 Do 1 sessions per day. Do 5 days per week.  Copyright  VHI. All rights reserved.     Exercises with Eyes Closed   Balance: Eyes Closed - Bilateral (Varied Surfaces)    Stand on pillows, feet shoulder width, close eyes. Maintain balance 30 seconds. Do 1 sets per session. Do 5 sessions per week.   Copyright  VHI. All rights reserved.      Feet Apart (Compliant Surface) Head Motion - Eyes Closed    Stand on compliant surface  with feet shoulder width apart. Close eyes and move head slowly, right and left x 10, up and down x 10 Repeat 1 times per session. Do 1 sessions per day. Do 5  days per week.     Feet Together (Compliant Surface) Head Motion - Eyes Closed    Stand on compliant surface with feet together. Close eyes and move head slowly, right and left x 10, up and down x 10. Repeat 1 times per session. Do 1 sessions per day. Do 5 days per week.  Copyright  VHI. All rights reserved.

## 2016-12-12 ENCOUNTER — Ambulatory Visit: Payer: Medicare Other | Admitting: Physical Therapy

## 2016-12-12 DIAGNOSIS — R2681 Unsteadiness on feet: Secondary | ICD-10-CM | POA: Diagnosis not present

## 2016-12-12 DIAGNOSIS — R2689 Other abnormalities of gait and mobility: Secondary | ICD-10-CM

## 2016-12-12 DIAGNOSIS — M6281 Muscle weakness (generalized): Secondary | ICD-10-CM

## 2016-12-12 NOTE — Therapy (Signed)
[facility] [address] [place], [place], [address] Phone: [phone]   Fax:  [phone]  Physical Therapy Treatment  Patient Details  Name: William [name] MRN: [id number] Date of Birth: 08-03-1934 Referring Provider: Jonetta Osgood (hospitalist); PCP: Jani Gravel, MD  Encounter Date: 12/12/2016      PT End of Session - 12/12/16 1251    Visit Number 8  G2   Number of Visits 13   Date for PT Re-Evaluation 01/08/17   Authorization Type Medicare   Authorization Time Period 12/09/16 to 02/07/17   PT Start Time 1150   PT Stop Time 1230   PT Time Calculation (min) 40 min   Activity Tolerance Patient tolerated treatment well   Behavior During Therapy [facility] for tasks assessed/performed      Past Medical History:  Diagnosis Date  . Arthritis   . Diabetes mellitus without complication ([place])   . Hypercholesteremia   . Hypertension   . Prostate cancer [facility])    with radiation treatment  . Sleep apnea 2/14   "mild per patient" hasnt gotten CPAP machine    Past Surgical History:  Procedure Laterality Date  . ADENOIDECTOMY    . FLEXIBLE SIGMOIDOSCOPY N/A 09/12/2013   Procedure: FLEXIBLE SIGMOIDOSCOPY;  Surgeon: Milus Banister, MD;  [facility];  Service: Endoscopy;  Laterality: N/A;  . HERNIA REPAIR    . HOT HEMOSTASIS N/A 09/12/2013   Procedure: HOT HEMOSTASIS (ARGON PLASMA COAGULATION/BICAP);  Surgeon: Milus Banister, MD;  Location: [facility];  Service: Endoscopy;  Laterality: N/A;  . LIPOMA EXCISION    . OTHER SURGICAL HISTORY     s/p prostate radiation  . TONSILLECTOMY    . TOTAL KNEE ARTHROPLASTY Left 03/25/2014   Procedure: LEFT TOTAL KNEE ARTHROPLASTY;  Surgeon: [name], MD;  [facility];  Service: Orthopedics;  Laterality: Left;  . TOTAL KNEE ARTHROPLASTY Right 05/06/2014   Procedure: RIGHT TOTAL KNEE ARTHROPLASTY;  Surgeon: [name], MD;  [facility]: [facility];  Service:  Orthopedics;  Laterality: Right;    There were no vitals filed for this visit.      Subjective Assessment - 12/12/16 1153    Subjective Has not done corner balance exercises due to no pillows to stand on. Encouraged to do even without pillows. States recent MD office visit and weight down 12 lbs (mostly due to recent illness).    Pertinent History arthritis, DM, HLD, HTN, a fib   Patient Stated Goals return to tai chi, get back to regular activity   Currently in Pain? No/denies            [facility] PT Assessment - 12/12/16 1237      6 Minute Walk- Baseline   6 Minute Walk- Baseline yes   HR (bpm) 79   02 Sat (%RA) 97 %     6 Minute walk- Post Test   6 Minute Walk Post Test yes   HR (bpm) 77   02 Sat (%RA) 98 %     6 minute walk test results    Aerobic Endurance Distance Walked 1210   Endurance additional comments 6962 ft norm for 42-89 yr old men                     [facility] Adult PT Treatment/Exercise - 12/12/16 1237      Transfers   Transfers Sit to Stand;Stand to Sit   Sit to Stand 7: Independent   Stand to  Sit 7: Independent     Ambulation/Gait   Ambulation/Gait Assistance 6: Modified independent (Device/Increase time)   Ambulation Distance (Feet) 1210 Feet   Assistive device None   Gait Pattern Lateral hip instability;Step-through pattern;Lateral trunk lean to right;Lateral trunk lean to left;Wide base of support   Ambulation Surface Level;Indoor   Gait Comments see also 6MWT     Exercises   Exercises Knee/Hip     Knee/Hip Exercises: Standing   Hip Abduction Stengthening;Both;1 set;10 reps;Knee straight  single UE support   Abduction Limitations educated to stand sideways to counter, 1-2 finger support; hold x 2 seconds focusing on strong glut contraction and upright torso; pt fatigued after 4-5 reps and required tactile cues for posture             Balance Exercises - 12/12/16 1239      Balance Exercises: Standing   Standing, One Foot on  a Step Eyes open;Foam/compliant surface;6 inch  10 reps rt, then lt; 2 sets; on doubled red mat, tap cone   Rockerboard Anterior/posterior;Lateral;Head turns;EO;EC;Intermittent UE support  x 2 minutes each direction of board,    Step Ups Lateral;6 inch;UE support 1;Intermittent UE support           PT Education - 12/12/16 1249    Education provided Yes   Education Details provided written handout for HEP from last visit. Reviewed instructions and pt with no questions.    Person(s) Educated Patient   Methods Explanation;Handout   Comprehension Verbalized understanding;Need further instruction             PT Long Term Goals - 12/12/16 1325      PT LONG TERM GOAL #1   Title verbalize understanding of CVA risk factors/warning signs (Target Date for all LTGs: 12/08/16)   Baseline Met; 12/07/16.   Status Achieved     PT LONG TERM GOAL #2   Title independent with HEP / upgrade to independent with updated HEP (target 01/08/17)   Baseline Pt very little performance due to sickness, 12/07/16.   Time 4   Period Weeks   Status Revised     PT LONG TERM GOAL #3   Title improve BERG balance score to >/= 52/56 for improved balance    Baseline Scored 51/56, 12/07/16.   Time 4   Status On-going     PT LONG TERM GOAL #4   Title improve timed up and go to < 13 sec for improved function   Baseline 12.81 seconds, 12/07/16.   Status Achieved     PT LONG TERM GOAL #5   Title improve dynamic gait index to >/= 20/24 for improved mobility and decreased fall risk   Baseline 20/24 12/07/16.   Status Achieved     PT LONG TERM GOAL #6   Title Patient will demonstrate improved confidence in his balance as demonstrated by successful return to his tai chi class at least 1 time per week.   Baseline Patient has not returned for any classes (12/09/16). Previously did tai chi 3x/week x 13 years.   Time 4   Period Weeks   Status New               Plan - 12/12/16 1253    Clinical Impression  Statement Patient continues to make improvements in balance, gait, and muscular/cardiovascular endurance. 6 minute walk test assessed due to patient's continued reports of generalized fatigue limiting his activity. He was able to complete 1210 ft which is below the 1338 ft normal for  45-89 yr old men. HIs HR and SaO2 were normal during testing and he held conversation majority of the six minutes. Bil hip abductor strength (lt < rt) continues to impair his balance and gait with emphasis on strengthening and balance tasks this date.   PT Treatment/Interventions ADLs/Self Care Home Management;Cryotherapy;Moist Heat;Neuromuscular re-education;Electrical Stimulation;Balance training;Therapeutic exercise;Therapeutic activities;Functional mobility training;Stair training;Gait training;Patient/family education;Vestibular   PT Next Visit Plan Pt continues with push back re: returning to doing tai chi (altough a goal)--have him do sequence/routine; balance and hip abductor strengthening!!   Consulted and Agree with Plan of Care Patient      Patient will benefit from skilled therapeutic intervention in order to improve the following deficits and impairments:  Abnormal gait, Decreased balance, Decreased mobility, Difficulty walking, Decreased strength, Decreased activity tolerance  Visit Diagnosis: Other abnormalities of gait and mobility  Muscle weakness (generalized)     Problem List Patient Active Problem List   Diagnosis Date Noted  . Stroke ([place]) 10/26/2016  . Obese 05/07/2014  . Expected blood loss anemia 05/07/2014  . S/P right TKA 05/06/2014  . DJD (degenerative joint disease) of knee 03/25/2014  . Preop cardiovascular exam 03/19/2014  . Abnormal ECG 03/19/2014  . Hypertension   . Diabetes mellitus without complication ([place])   . Hypercholesteremia   . Arthritis   . Prostate cancer ([place])   . Sleep apnea     Rexanne Mano, PT 12/12/2016, 1:28 PM  [facility] [address] [place], [place], [address] Phone: [phone]   Fax:  [phone]  Name: William [name] MRN: [id number] Date of Birth: 1934/05/30

## 2016-12-12 NOTE — Patient Instructions (Addendum)
Printed exercise program from last visit as pt did not get a copy that day.    ABDUCTION: Standing - Resistance Band (Active)    Stand sideways at counter, feet flat (NO band around legs). Use 1-2 fingers on counter. Lift right leg out to side. Hold 2 counts. Complete _2__ sets of _10__ repetitions. Perform _1-2__ sessions per day. Do 5 days per week.  When able to do 10 reps with 2 second hold, may begin using red band for increase strengthening.   Copyright  VHI. All rights reserved.       ** Always do these exercises while standing in a corner with your back to the walls and a chair in front of you (for arm support if needed).  EXERCISES WITH EYES OPEN   Balance: Eyes Open - Bilateral (Varied Surfaces)    Stand on compliant surface: foam/pillows, feet shoulder width, eyes open. Maintain balance 30 seconds. Repeat 1 times per set. Do 1 sets per session. Do 5 sessions per week.       Feet Apart (Compliant Surface) Head Motion - Eyes Open    With eyes open, standing on compliant surface: feet shoulder width apart, move head slowly: left and right x 10 reps; up and down x 10 reps  Do 1 sessions per day. Do 5 days per week.  Copyright  VHI. All rights reserved.    Feet Together (Compliant Surface) Head Motion - Eyes Open    With eyes open, standing on compliant surface: feet together, move head slowly: right and left x 10; up and down x 10 Do 1 sessions per day. Do 5 days per week.  Copyright  VHI. All rights reserved.    Exercises with Eyes Closed   Balance: Eyes Closed - Bilateral (Varied Surfaces)    Stand on pillows, feet shoulder width, close eyes. Maintain balance 30 seconds. Do 1 sets per session. Do 5 sessions per week.   Copyright  VHI. All rights reserved.      Feet Apart (Compliant Surface) Head Motion - Eyes Closed    Stand on compliant surface  with feet shoulder width apart. Close eyes and move  head slowly, right and left x 10, up and down x 10 Repeat 1 times per session. Do 1 sessions per day. Do 5 days per week.     Feet Together (Compliant Surface) Head Motion - Eyes Closed    Stand on compliant surface with feet together. Close eyes and move head slowly, right and left x 10, up and down x 10. Repeat 1 times per session. Do 1 sessions per day. Do 5 days per week.  Copyright  VHI. All rights reserved.

## 2016-12-15 ENCOUNTER — Ambulatory Visit: Payer: PRIVATE HEALTH INSURANCE | Admitting: Physical Therapy

## 2016-12-19 ENCOUNTER — Encounter: Payer: Self-pay | Admitting: Physical Therapy

## 2016-12-19 ENCOUNTER — Ambulatory Visit: Payer: Medicare Other | Admitting: Physical Therapy

## 2016-12-19 DIAGNOSIS — R2689 Other abnormalities of gait and mobility: Secondary | ICD-10-CM

## 2016-12-19 DIAGNOSIS — R2681 Unsteadiness on feet: Secondary | ICD-10-CM

## 2016-12-19 DIAGNOSIS — M6281 Muscle weakness (generalized): Secondary | ICD-10-CM

## 2016-12-19 NOTE — Therapy (Signed)
[facility] [address] [place], [place], [address] Phone: [phone]   Fax:  [phone]  Physical Therapy Treatment  Patient Details  Name: William [name] MRN: [id number] Date of Birth: 01-21-1934 Referring Provider: Jonetta Osgood (hospitalist); PCP: Jani Gravel, MD  Encounter Date: 12/19/2016      PT End of Session - 12/19/16 1458    Visit Number 9   Number of Visits 13   Date for PT Re-Evaluation 01/08/17   Authorization Type Medicare   Authorization Time Period 12/09/16 to 02/07/17   Authorization - Visit Number 3   Authorization - Number of Visits 10  G Code Counter   PT Start Time 4158   PT Stop Time 1015   PT Time Calculation (min) 43 min   Activity Tolerance Patient tolerated treatment well   Behavior During Therapy [facility] for tasks assessed/performed      Past Medical History:  Diagnosis Date  . Arthritis   . Diabetes mellitus without complication ([place])   . Hypercholesteremia   . Hypertension   . Prostate cancer [facility])    with radiation treatment  . Sleep apnea 2/14   "mild per patient" hasnt gotten CPAP machine    Past Surgical History:  Procedure Laterality Date  . ADENOIDECTOMY    . FLEXIBLE SIGMOIDOSCOPY N/A 09/12/2013   Procedure: FLEXIBLE SIGMOIDOSCOPY;  Surgeon: Milus Banister, MD;  Location: [facility] ENDOSCOPY;  Service: Endoscopy;  Laterality: N/A;  . HERNIA REPAIR    . HOT HEMOSTASIS N/A 09/12/2013   Procedure: HOT HEMOSTASIS (ARGON PLASMA COAGULATION/BICAP);  Surgeon: Milus Banister, MD;  Location: [facility];  Service: Endoscopy;  Laterality: N/A;  . LIPOMA EXCISION    . OTHER SURGICAL HISTORY     s/p prostate radiation  . TONSILLECTOMY    . TOTAL KNEE ARTHROPLASTY Left 03/25/2014   Procedure: LEFT TOTAL KNEE ARTHROPLASTY;  Surgeon: [name], MD;  Location: [facility]S;  Service: Orthopedics;  Laterality: Left;  . TOTAL KNEE ARTHROPLASTY Right 05/06/2014   Procedure: RIGHT TOTAL  KNEE ARTHROPLASTY;  Surgeon: [name], MD;  Location: [facility];  Service: Orthopedics;  Laterality: Right;    There were no vitals filed for this visit.      Subjective Assessment - 12/19/16 0939    Subjective Reports feeling better and has returned to performing HEP at home but not "as often as I should."  Reports performing exercises this am and experienced a pulling in R quad; had to back off exercise.  Still has not returned to [place]; shooting to return in mid February.  Pt found a pillow at home to use with balance exercises; reports performing them standing at kitchen sink.   Pertinent History arthritis, DM, HLD, HTN, a fib   Patient Stated Goals return to tai chi, get back to regular activity   Currently in Pain? No/denies                         [facility] Adult PT Treatment/Exercise - 12/19/16 0944      Knee/Hip Exercises: Aerobic   Nustep level 7 x 8 minutes with bilat UE and LE for strength/endurance  HR 60; medium intensity             Balance Exercises - 12/19/16 0946      Balance Exercises: Standing   Standing Eyes Opened Wide (BOA);Narrow base of support (BOS);Head turns;Foam/compliant surface;Other reps (comment);5 reps  10 reps vertical and  horizontal head turns; add UE movemen   Standing Eyes Closed Narrow base of support (BOS);Wide (BOA);Head turns;Foam/compliant surface;Other reps (comment)  10 resps each   Tandem Gait Forward;Upper extremity support;4 reps           PT Education - 12/19/16 0943    Education provided Yes   Education Details educated pt on importance of performing balance exercises in corner for safety and prevent falling posterior.   Person(s) Educated Patient   Methods Explanation;Demonstration   Comprehension Verbalized understanding;Returned demonstration             PT Long Term Goals - 12/12/16 1325      PT LONG TERM GOAL #1   Title verbalize understanding of CVA risk factors/warning signs (Target  Date for all LTGs: 12/08/16)   Baseline Met; 12/07/16.   Status Achieved     PT LONG TERM GOAL #2   Title independent with HEP / upgrade to independent with updated HEP (target 01/08/17)   Baseline Pt very little performance due to sickness, 12/07/16.   Time 4   Period Weeks   Status Revised     PT LONG TERM GOAL #3   Title improve BERG balance score to >/= 52/56 for improved balance    Baseline Scored 51/56, 12/07/16.   Time 4   Status On-going     PT LONG TERM GOAL #4   Title improve timed up and go to < 13 sec for improved function   Baseline 12.81 seconds, 12/07/16.   Status Achieved     PT LONG TERM GOAL #5   Title improve dynamic gait index to >/= 20/24 for improved mobility and decreased fall risk   Baseline 20/24 12/07/16.   Status Achieved     PT LONG TERM GOAL #6   Title Patient will demonstrate improved confidence in his balance as demonstrated by successful return to his tai chi class at least 1 time per week.   Baseline Patient has not returned for any classes (12/09/16). Previously did tai chi 3x/week x 13 years.   Time 4   Period Weeks   Status New               Plan - 12/19/16 1458    Clinical Impression Statement Pt continues to make progress with balance, LE strength and gait.  Still reporting difficulty with standing hip exercises; will continue to assess and progress as able.  Focus today on endurance, progressing corner exercises to include UE movements with head movements and addition of tandem gait for hip strengthening and balance.  Will continue to address strength, balance and gait impairments.   PT Treatment/Interventions ADLs/Self Care Home Management;Cryotherapy;Moist Heat;Neuromuscular re-education;Electrical Stimulation;Balance training;Therapeutic exercise;Therapeutic activities;Functional mobility training;Stair training;Gait training;Patient/family education;Vestibular   PT Next Visit Plan Review standing hip flexion from HEP; pt reporting pain  in R hip joint with flexion.   Consulted and Agree with Plan of Care Patient      Patient will benefit from skilled therapeutic intervention in order to improve the following deficits and impairments:  Abnormal gait, Decreased balance, Decreased mobility, Difficulty walking, Decreased strength, Decreased activity tolerance  Visit Diagnosis: Other abnormalities of gait and mobility  Muscle weakness (generalized)  Unsteadiness on feet     Problem List Patient Active Problem List   Diagnosis Date Noted  . Stroke ([place]) 10/26/2016  . Obese 05/07/2014  . Expected blood loss anemia 05/07/2014  . S/P right TKA 05/06/2014  . DJD (degenerative joint disease) of knee 03/25/2014  .  Preop cardiovascular exam 03/19/2014  . Abnormal ECG 03/19/2014  . Hypertension   . Diabetes mellitus without complication ([place])   . Hypercholesteremia   . Arthritis   . Prostate cancer ([place])   . Sleep apnea    Raylene Everts, PT, DPT 12/19/16    3:05 PM    [facility] [address] [place], [place], [address] Phone: [phone]   Fax:  [phone]  Name: William [name] MRN: [id number] Date of Birth: [birth year]

## 2016-12-22 ENCOUNTER — Encounter: Payer: Self-pay | Admitting: Physical Therapy

## 2016-12-22 ENCOUNTER — Ambulatory Visit: Payer: Medicare Other | Admitting: Physical Therapy

## 2016-12-22 DIAGNOSIS — R2681 Unsteadiness on feet: Secondary | ICD-10-CM

## 2016-12-22 DIAGNOSIS — R2689 Other abnormalities of gait and mobility: Secondary | ICD-10-CM

## 2016-12-22 DIAGNOSIS — M6281 Muscle weakness (generalized): Secondary | ICD-10-CM

## 2016-12-22 NOTE — Therapy (Signed)
[facility] [address] [place], [place], [address] Phone: [phone]   Fax:  [phone]  Physical Therapy Treatment  Patient Details  Name: William [name] MRN: [id number] Date of Birth: 1934/06/14 Referring Provider: Jonetta Osgood (hospitalist); PCP: Jani Gravel, MD  Encounter Date: 12/22/2016      PT End of Session - 12/22/16 1141    Visit Number 10  G4   Number of Visits 13   Date for PT Re-Evaluation 01/08/17   Authorization Type Medicare   Authorization Time Period 12/09/16 to 02/07/17   Authorization - Visit Number 4   Authorization - Number of Visits 10  G Code Counter   PT Start Time 0845   PT Stop Time 0940   PT Time Calculation (min) 55 min   Activity Tolerance Patient tolerated treatment well   Behavior During Therapy [facility] for tasks assessed/performed      Past Medical History:  Diagnosis Date  . Arthritis   . Diabetes mellitus without complication ([place])   . Hypercholesteremia   . Hypertension   . Prostate cancer [facility])    with radiation treatment  . Sleep apnea 2/14   "mild per patient" hasnt gotten CPAP machine    Past Surgical History:  Procedure Laterality Date  . ADENOIDECTOMY    . FLEXIBLE SIGMOIDOSCOPY N/A 09/12/2013   Procedure: FLEXIBLE SIGMOIDOSCOPY;  Surgeon: Milus Banister, MD;  [facility]: [facility];  Service: Endoscopy;  Laterality: N/A;  . HERNIA REPAIR    . HOT HEMOSTASIS N/A 09/12/2013   Procedure: HOT HEMOSTASIS (ARGON PLASMA COAGULATION/BICAP);  Surgeon: Milus Banister, MD;  Location: [facility];  Service: Endoscopy;  Laterality: N/A;  . LIPOMA EXCISION    . OTHER SURGICAL HISTORY     s/p prostate radiation  . TONSILLECTOMY    . TOTAL KNEE ARTHROPLASTY Left 03/25/2014   Procedure: LEFT TOTAL KNEE ARTHROPLASTY;  Surgeon: [name], MD;  [facility]: [facility];  Service: Orthopedics;  Laterality: Left;  . TOTAL KNEE ARTHROPLASTY Right 05/06/2014   Procedure: RIGHT  TOTAL KNEE ARTHROPLASTY;  Surgeon: [name], MD;  Location[facility];  Service: Orthopedics;  Laterality: Right;    There were no vitals filed for this visit.      Subjective Assessment - 12/22/16 0851    Subjective He reports no falls. His exercises are going well.    Pertinent History arthritis, DM, HLD, HTN, a fib   Patient Stated Goals return to tai chi, get back to regular activity   Currently in Pain? No/denies     Therapeutic Exercise: See pt instruction / pt education.                             PT Education - 12/22/16 0900    Education provided Yes   Education Details consider going to [place] class & performing only easier exercises near counter.  Updated HEP: corner vestibular activities, alternate LE kicks without support near counter, red theraband hip with UE support and modified single leg stance at counter with Tai Chi UE movements   Person(s) Educated Patient   Methods Explanation;Demonstration;Tactile cues;Verbal cues;Handout   Comprehension Verbalized understanding;Returned demonstration;Verbal cues required;Tactile cues required;Need further instruction             PT Long Term Goals - 12/12/16 1325      PT LONG TERM GOAL #1   Title verbalize understanding of CVA risk factors/warning signs (Target  Date for all LTGs: 12/08/16)   Baseline Met; 12/07/16.   Status Achieved     PT LONG TERM GOAL #2   Title independent with HEP / upgrade to independent with updated HEP (target 01/08/17)   Baseline Pt very little performance due to sickness, 12/07/16.   Time 4   Period Weeks   Status Revised     PT LONG TERM GOAL #3   Title improve BERG balance score to >/= 52/56 for improved balance    Baseline Scored 51/56, 12/07/16.   Time 4   Status On-going     PT LONG TERM GOAL #4   Title improve timed up and go to < 13 sec for improved function   Baseline 12.81 seconds, 12/07/16.   Status Achieved     PT LONG TERM GOAL #5   Title  improve dynamic gait index to >/= 20/24 for improved mobility and decreased fall risk   Baseline 20/24 12/07/16.   Status Achieved     PT LONG TERM GOAL #6   Title Patient will demonstrate improved confidence in his balance as demonstrated by successful return to his tai chi class at least 1 time per week.   Baseline Patient has not returned for any classes (12/09/16). Previously did tai chi 3x/week x 13 years.   Time 4   Period Weeks   Status New               Plan - 12/22/16 1142    Clinical Impression Statement Patient appears to understand updated HEP. Patient appears ready to start some Tai Chi movements with limiting reps & complexity of exercises. He was planning to wait until after PT discharge but understands recommendation for SOFT start.    PT Treatment/Interventions ADLs/Self Care Home Management;Cryotherapy;Moist Heat;Neuromuscular re-education;Electrical Stimulation;Balance training;Therapeutic exercise;Therapeutic activities;Functional mobility training;Stair training;Gait training;Patient/family education;Vestibular   PT Next Visit Plan Review standing hip flexion from HEP; pt reporting pain in R hip joint with flexion.   Consulted and Agree with Plan of Care Patient      Patient will benefit from skilled therapeutic intervention in order to improve the following deficits and impairments:  Abnormal gait, Decreased balance, Decreased mobility, Difficulty walking, Decreased strength, Decreased activity tolerance  Visit Diagnosis: Other abnormalities of gait and mobility  Muscle weakness (generalized)  Unsteadiness on feet     Problem List Patient Active Problem List   Diagnosis Date Noted  . Stroke ([place]) 10/26/2016  . Obese 05/07/2014  . Expected blood loss anemia 05/07/2014  . S/P right TKA 05/06/2014  . DJD (degenerative joint disease) of knee 03/25/2014  . Preop cardiovascular exam 03/19/2014  . Abnormal ECG 03/19/2014  . Hypertension   . Diabetes  mellitus without complication ([place])   . Hypercholesteremia   . Arthritis   . Prostate cancer ([place])   . Sleep apnea     Larron Armor  PT, DPT 12/22/2016, 11:53 AM  [facility] [address] [place], [place], [address] Phone: [phone]   Fax:  [phone]  Name: William [name] MRN: [id number] Date of Birth: [birth year]

## 2016-12-22 NOTE — Patient Instructions (Addendum)
Feet Together, Head Motion - Eyes Open    With eyes open, feet together, move head slowly: up/down, right /left, up-right/down-left and up-left/down-right. Repeat __10__ times each direction.   Copyright  VHI. All rights reserved.  Feet Apart, Head Motion - Eyes Closed    With eyes closed and feet shoulder width apart, move head slowly,  up/down, right /left, up-right/down-left and up-left/down-right. Repeat __10__ times each direction.   Copyright  VHI. All rights reserved.  Feet Apart (Compliant Surface) Head Motion - Eyes Open    With eyes open, standing on compliant surface: __pillow or foam______, feet shoulder width apart, move head slowly:  up/down, right /left, up-right/down-left and up-left/down-right. Repeat __10__ times each direction.   Copyright  VHI. All rights reserved.  Feet Apart (Compliant Surface) Head Motion - Eyes Closed    Stand on compliant surface: __pillow or foam______ with feet shoulder width apart and LIGHT fingertip touch on chair. Close eyes and move head slowly,  up/down, right /left, up-right/down-left and up-left/down-right. Repeat __10__ times each direction.   Copyright  VHI. All rights reserved.    DO NOT HOLD ON BUT HAVE SUPPORT CLOSE FOR KICKS WITHOUT BAND.  ALTERNATE LEGS.   Standing Hip Flexion    While standing on one leg, lift your other leg forward with a straight knee as shown. Return to starting position and repeat 10 times   Use your arms for support if needed for balance and safety.  HIP / KNEE: Flexion - Standing    Raise knee to chest. Keep back straight. Perform slowly. __10_ reps  Copyright  VHI. All rights reserved.   Standing Hip Abduction    While standing, raise your leg out to the side. Keep your knee straight and maintain your toes pointed forward the entire time.  Repeat 10 timesUse your arms for support if needed for balance and safety.  Standing Hip Extension    While standing, balance on  one leg and move your other leg in a backward direction. Do not swing the leg. Perform smooth and controlled movements.   Keep your trunk stable and without arching during the movement.  Repeat 10 times   Use your arms for support if needed for balance and safety.  Standing Hip Adduction    Using support, move leg across body to opposite side and back to starting position. Do 10 times. Repeat with other leg.  http://gt2.exer.us/771   Copyright  VHI. All rights reserved.   HOLD SUPPORT WITH BAND AND UPRIGHT POSTURE.   Resisted - Four Way Hip      Face away from anchor in wide stride stance, tubing looped around ankle. Flex hip and knee forward. Repeat _10_ times http://tub.exer.us/57   Copyright  VHI. All rights reserved.    With theraband around right ankle, balance on left leg and complete leg kicks in the following directions:  1. Facing toward theraband, extend right leg behind you with knee straight. Avoid bending forward at your hips. Repeat 10 times. 2. Turn to right, slowly kick leg out to the side while keeping your toes facing forward. Avoid leaning to the side. Repeat 10 times. 3. Turn to face away from theraband, kick leg straight forward keeping knee straight. Repeat 10 times. 4. Back to band, raise hip & knee upward. Repeat 10 times.  5. Turn to the right and take a step away from the theraband. Pull right leg in and slightly in front of left. Repeat 10 times.  Then repeat the 4 positions  with the band around the other ankle.    Standing at counter or sink: place forefoot of one leg in lower cabinet.  Perform arm Ti chi motions Repeat with other foot in cabinet.   Then switch to different arm motions

## 2016-12-23 ENCOUNTER — Encounter: Payer: Self-pay | Admitting: Podiatry

## 2016-12-23 ENCOUNTER — Ambulatory Visit (INDEPENDENT_AMBULATORY_CARE_PROVIDER_SITE_OTHER): Payer: Medicare Other | Admitting: Podiatry

## 2016-12-23 DIAGNOSIS — M79676 Pain in unspecified toe(s): Secondary | ICD-10-CM

## 2016-12-23 DIAGNOSIS — B351 Tinea unguium: Secondary | ICD-10-CM | POA: Diagnosis not present

## 2016-12-23 DIAGNOSIS — E119 Type 2 diabetes mellitus without complications: Secondary | ICD-10-CM | POA: Diagnosis not present

## 2016-12-23 NOTE — Progress Notes (Signed)
Patient ID: William [name], male   DOB: 10/24/1934, 82 y.o.   MRN: [id number] Complaint:  Visit Type: Patient returns to my office for continued preventative foot care services. Complaint: Patient states" my nails have grown long and thick and become painful to walk and wear shoes" Patient has been diagnosed with DM with no foot complications. The patient presents for preventative foot care services. No changes to ROS  Podiatric Exam: Vascular: dorsalis pedis and posterior tibial pulses are palpable bilateral. Capillary return is immediate. Temperature gradient is WNL. Skin turgor WNL  Sensorium: Normal Semmes Weinstein monofilament test. Normal tactile sensation bilaterally. Nail Exam: Pt has thick disfigured discolored nails with subungual debris noted bilateral entire nail hallux through fifth toenails Ulcer Exam: There is no evidence of ulcer or pre-ulcerative changes or infection. Orthopedic Exam: Muscle tone and strength are WNL. No limitations in general ROM. No crepitus or effusions noted. Foot type and digits show no abnormalities. Bony prominences are unremarkable. Skin: No Porokeratosis. No infection or ulcers  Diagnosis:  Onychomycosis, , Pain in right toe, pain in left toes  Treatment & Plan Procedures and Treatment: Consent by patient was obtained for treatment procedures. The patient understood the discussion of treatment and procedures well. All questions were answered thoroughly reviewed. Debridement of mycotic and hypertrophic toenails, 1 through 5 bilateral and clearing of subungual debris. No ulceration, no infection noted.  Return Visit-Office Procedure: Patient instructed to return to the office for a follow up visit 3 months for continued evaluation and treatment.    Iyona Pehrson DPM 

## 2016-12-29 ENCOUNTER — Encounter: Payer: Self-pay | Admitting: Physical Therapy

## 2016-12-29 ENCOUNTER — Encounter: Payer: Self-pay | Admitting: Neurology

## 2016-12-29 ENCOUNTER — Ambulatory Visit: Payer: Medicare Other | Attending: Internal Medicine | Admitting: Physical Therapy

## 2016-12-29 ENCOUNTER — Ambulatory Visit (INDEPENDENT_AMBULATORY_CARE_PROVIDER_SITE_OTHER): Payer: Medicare Other | Admitting: Neurology

## 2016-12-29 VITALS — BP 127/70 | HR 74 | Ht 73.0 in | Wt 228.7 lb

## 2016-12-29 DIAGNOSIS — I63412 Cerebral infarction due to embolism of left middle cerebral artery: Secondary | ICD-10-CM | POA: Diagnosis not present

## 2016-12-29 DIAGNOSIS — M6281 Muscle weakness (generalized): Secondary | ICD-10-CM

## 2016-12-29 DIAGNOSIS — R2681 Unsteadiness on feet: Secondary | ICD-10-CM | POA: Diagnosis not present

## 2016-12-29 DIAGNOSIS — R2689 Other abnormalities of gait and mobility: Secondary | ICD-10-CM | POA: Diagnosis not present

## 2016-12-29 NOTE — Progress Notes (Signed)
[facility] [address] [place]. [place] [address] [phone]       OFFICE FOLLOW-UP NOTE  William. William [name] Date of Birth:  1934/04/23 Medical Record Number:  [id number]   HPI: William [name] is a pleasant elderly Caucasian male seen today for first office follow-up visit for in-hospital admission for stroke in November 2017. He is accompanied by his wife. History is obtained from the patient and review of hospital records.William [name] an 83 y.o.malewith a history of diabetes mellitus, hypertension and hyperlipidemia presenting following an episode of facial droop and slurred speech lasting about 15 minutes. Onset was at 9:15 AM on 10/26/2016. He has no previous history of stroke nor TIA. He's been taking aspirin 81 mg per day. CT scan of the head showed no acute intracranial abnormality. NIH stroke score at the time of this evaluation was 0. He had no weakness of right upper and lower extremities. LSN:9:30 AM on 10/26/2016 tPA Given:No: Deficits resolved. MRI scan of the brain showed tiny punctate left frontal cortical infarct. MRA of the brain showed no large vessel stenosis. Carotid ultrasound showed no significant extrinsic stenosis. Transthoracic echo showed normal ejection fraction. Hemoglobin A1c was 6.8. LDL cholesterol was 72 mg percent. Patient was started on eliquis for stroke prevention. He states is tolerating it well but does get bruised easily. He states his blood pressure is under good control and today it is 127/70. He had lipid lipid profile checked on 11/08/16 which showed LDL cholesterol 78 and a hemoglobin A1c 6.7. He is currently part is getting outpatient physical therapy for balance. He states he has had no recurrent stroke or TIA symptoms. He has no other new complaints today.  ROS:   14 system review of systems is positive for  cold intolerance, easy bruising, bleeding and all other systems negative  PMH:  Past Medical History:  Diagnosis Date    . Arthritis   . Diabetes mellitus without complication ([place])   . Hypercholesteremia   . Hypertension   . Prostate cancer [facility])    with radiation treatment  . Sleep apnea 2/14   "mild per patient" hasnt gotten CPAP machine  . Stroke [facility])     Social History:  Social History   Social History  . Marital status: Married    Spouse name: N/A  . Number of children: N/A  . Years of education: N/A   Occupational History  . Not on file.   Social History Main Topics  . Smoking status: Former Smoker    Start date: 11/28/1982  . Smokeless tobacco: Never Used  . Alcohol use 0.6 oz/week    1 Shots of liquor per week     Comment: occasionally  . Drug use: No  . Sexual activity: Not on file   Other Topics Concern  . Not on file   Social History Narrative  . No narrative on file    Medications:   Current Outpatient Prescriptions on File Prior to Visit  Medication Sig Dispense Refill  . amoxicillin (AMOXIL) 500 MG capsule Take 2,000 mg by mouth as directed. 1 hour prior to dental procedures.    Marland Kitchen apixaban (ELIQUIS) 5 MG TABS tablet Take 1 tablet (5 mg total) by mouth 2 (two) times daily. 30 tablet 0  . co-enzyme Q-10 30 MG capsule Take 300 mg by mouth daily.    . Glucosamine-Chondroitin 1500-1200 MG/30ML LIQD Take 1 tablet by mouth 2 (two) times daily.    Marland Kitchen HUMALOG KWIKPEN 100 UNIT/ML  KiwkPen Inject 2-8 Units into the skin 3 (three) times daily.     Marland Kitchen LEVEMIR FLEXTOUCH 100 UNIT/ML Pen Inject 16 Units into the skin daily at 10 pm. As directed.     Marland Kitchen lisinopril-hydrochlorothiazide (PRINZIDE,ZESTORETIC) 10-12.5 MG per tablet Take 1 tablet by mouth every morning.     . metFORMIN (GLUCOPHAGE) 1000 MG tablet Take 1,000 mg by mouth 2 (two) times daily with a meal.    . ONGLYZA 5 MG TABS tablet Take 5 mg by mouth at bedtime.     . pravastatin (PRAVACHOL) 40 MG tablet Take 40 mg by mouth daily with supper.      No current facility-administered medications on file prior to visit.      Allergies:   Allergies  Allergen Reactions  . Glyburide Other (See Comments)    Hypoglycemia     Physical Exam General: well developed, well nourished elderly Caucasian male, seated, in no evident distress Head: head normocephalic and atraumatic.  Neck: supple with no carotid or supraclavicular bruits Cardiovascular: regular rate and rhythm, no murmurs Musculoskeletal: no deformity Skin:  no rash/petichiae Vascular:  Normal pulses all extremities Vitals:   12/29/16 1612  BP: 127/70  Pulse: 74   Neurologic Exam Mental Status: Awake and fully alert. Oriented to place and time. Recent and remote memory intact. Attention span, concentration and fund of knowledge appropriate. Mood and affect appropriate.  Cranial Nerves: Fundoscopic exam reveals sharp disc margins. Pupils equal, briskly reactive to light. Extraocular movements full without nystagmus. Visual fields full to confrontation. Hearing decreased slightly in the right ear despite hearing aid.. Facial sensation intact. Face, tongue, palate moves normally and symmetrically.  Motor: Normal bulk and tone. Normal strength in all tested extremity muscles. Sensory.: intact to touch ,pinprick .position and vibratory sensation.  Coordination: Rapid alternating movements normal in all extremities. Finger-to-nose and heel-to-shin performed accurately bilaterally. Gait and Station: Arises from chair without difficulty. Stance is normal. Gait demonstrates normal stride length and balance . Able to heel, toe and tandem walk without difficulty.  Reflexes: 1+ and symmetric. Toes downgoing.   NIHSS  0 Modified Rankin  0   ASSESSMENT: 83 year Caucasian male with embolic left frontal MCA branch infarct from atrial fibrillation in November 2017 who is doing very well with without any residual deficits. Vascular risk factors of atrial fibrillation, hypertension, diabetes and hyperlipidemia.    PLAN: I had a long d/w patient and his wife  about his recent  Embolic stroke from atrial fibrillation, , risk for recurrent stroke/TIAs, personally independently reviewed imaging studies and stroke evaluation results and answered questions.Continue Eliquis (apixaban) daily  for secondary stroke prevention and maintain strict control of hypertension with blood pressure goal below 130/90, diabetes with hemoglobin A1c goal below 6.5% and lipids with LDL cholesterol goal below 70 mg/dL. I also advised the patient to eat a healthy diet with plenty of whole grains, cereals, fruits and vegetables, exercise regularly and maintain ideal body weight Followup in the future with my nurse practitioner in 6 months or call earlier if necessary Greater than 50% of time during this 25 minute visit was spent on counseling,explanation of diagnosis, planning of further management, discussion with patient and family and coordination of care Antony Contras, MD  [facility] [address] [place], [place] [address]  Phone [phone] Fax [phone] Note: This document was prepared with digital dictation and possible smart phrase technology. Any transcriptional errors that result from this process are unintentional

## 2016-12-29 NOTE — Patient Instructions (Signed)
I had a long d/w patient and his wife about his recent  Embolic stroke from atrial fibrillation, , risk for recurrent stroke/TIAs, personally independently reviewed imaging studies and stroke evaluation results and answered questions.Continue Eliquis (apixaban) daily  for secondary stroke prevention and maintain strict control of hypertension with blood pressure goal below 130/90, diabetes with hemoglobin A1c goal below 6.5% and lipids with LDL cholesterol goal below 70 mg/dL. I also advised the patient to eat a healthy diet with plenty of whole grains, cereals, fruits and vegetables, exercise regularly and maintain ideal body weight Followup in the future with my nurse practitioner in 6 months or call earlier if necessary  Stroke Prevention Some medical conditions and behaviors are associated with an increased chance of having a stroke. You may prevent a stroke by making healthy choices and managing medical conditions. How can I reduce my risk of having a stroke?  Stay physically active. Get at least 30 minutes of activity on most or all days.  Do not smoke. It may also be helpful to avoid exposure to secondhand smoke.  Limit alcohol use. Moderate alcohol use is considered to be:  No more than 2 drinks per day for men.  No more than 1 drink per day for nonpregnant women.  Eat healthy foods. This involves:  Eating 5 or more servings of fruits and vegetables a day.  Making dietary changes that address high blood pressure (hypertension), high cholesterol, diabetes, or obesity.  Manage your cholesterol levels.  Making food choices that are high in fiber and low in saturated fat, trans fat, and cholesterol may control cholesterol levels.  Take any prescribed medicines to control cholesterol as directed by your health care provider.  Manage your diabetes.  Controlling your carbohydrate and sugar intake is recommended to manage diabetes.  Take any prescribed medicines to control diabetes as  directed by your health care provider.  Control your hypertension.  Making food choices that are low in salt (sodium), saturated fat, trans fat, and cholesterol is recommended to manage hypertension.  Ask your health care provider if you need treatment to lower your blood pressure. Take any prescribed medicines to control hypertension as directed by your health care provider.  If you are 5-32 years of age, have your blood pressure checked every 3-5 years. If you are 65 years of age or older, have your blood pressure checked every year.  Maintain a healthy weight.  Reducing calorie intake and making food choices that are low in sodium, saturated fat, trans fat, and cholesterol are recommended to manage weight.  Stop drug abuse.  Avoid taking birth control pills.  Talk to your health care provider about the risks of taking birth control pills if you are over 35 years old, smoke, get migraines, or have ever had a blood clot.  Get evaluated for sleep disorders (sleep apnea).  Talk to your health care provider about getting a sleep evaluation if you snore a lot or have excessive sleepiness.  Take medicines only as directed by your health care provider.  For some people, aspirin or blood thinners (anticoagulants) are helpful in reducing the risk of forming abnormal blood clots that can lead to stroke. If you have the irregular heart rhythm of atrial fibrillation, you should be on a blood thinner unless there is a good reason you cannot take them.  Understand all your medicine instructions.  Make sure that other conditions (such as anemia or atherosclerosis) are addressed. Get help right away if:  You have  sudden weakness or numbness of the face, arm, or leg, especially on one side of the body.  Your face or eyelid droops to one side.  You have sudden confusion.  You have trouble speaking (aphasia) or understanding.  You have sudden trouble seeing in one or both eyes.  You have  sudden trouble walking.  You have dizziness.  You have a loss of balance or coordination.  You have a sudden, severe headache with no known cause.  You have new chest pain or an irregular heartbeat. Any of these symptoms may represent a serious problem that is an emergency. Do not wait to see if the symptoms will go away. Get medical help at once. Call your local emergency services (911 in [place] Do not drive yourself to the hospital.  This information is not intended to replace advice given to you by your health care provider. Make sure you discuss any questions you have with your health care provider. Document Released: 12/22/2004 Document Revised: 04/21/2016 Document Reviewed: 05/17/2013 Elsevier Interactive Patient Education  2017 [employer].

## 2016-12-30 NOTE — Therapy (Signed)
[facility] [address] [place], [place], [address] Phone: [phone]   Fax:  [phone]  Physical Therapy Treatment  Patient Details  Name: William [name] MRN: [id number] Date of Birth: 21-Mar-1934 Referring Provider: Jonetta Osgood (hospitalist); PCP: Jani Gravel, MD  Encounter Date: 12/29/2016      PT End of Session - 12/29/16 1452    Visit Number 11  G5   Number of Visits 13   Date for PT Re-Evaluation 01/08/17   Authorization Type Medicare   Authorization Time Period 12/09/16 to 02/07/17   Authorization - Visit Number 5   Authorization - Number of Visits 10  G Code Counter   PT Start Time 1572   PT Stop Time 1530   PT Time Calculation (min) 42 min   Equipment Utilized During Treatment Gait belt   Activity Tolerance Patient tolerated treatment well   Behavior During Therapy WFL for tasks assessed/performed      Past Medical History:  Diagnosis Date  . Arthritis   . Diabetes mellitus without complication ([place])   . Hypercholesteremia   . Hypertension   . Prostate cancer [facility])    with radiation treatment  . Sleep apnea 2/14   "mild per patient" hasnt gotten CPAP machine  . Stroke [facility])     Past Surgical History:  Procedure Laterality Date  . ADENOIDECTOMY    . FLEXIBLE SIGMOIDOSCOPY N/A 09/12/2013   Procedure: FLEXIBLE SIGMOIDOSCOPY;  Surgeon: Milus Banister, MD;  Location: [facility] ENDOSCOPY;  Service: Endoscopy;  Laterality: N/A;  . HERNIA REPAIR    . HOT HEMOSTASIS N/A 09/12/2013   Procedure: HOT HEMOSTASIS (ARGON PLASMA COAGULATION/BICAP);  Surgeon: Milus Banister, MD;  Location: [facility];  Service: Endoscopy;  Laterality: N/A;  . LIPOMA EXCISION    . OTHER SURGICAL HISTORY     s/p prostate radiation  . TONSILLECTOMY    . TOTAL KNEE ARTHROPLASTY Left 03/25/2014   Procedure: LEFT TOTAL KNEE ARTHROPLASTY;  Surgeon: [name], MD;  Location: [facility] ORS;  Service: Orthopedics;  Laterality: Left;   . TOTAL KNEE ARTHROPLASTY Right 05/06/2014   Procedure: RIGHT TOTAL KNEE ARTHROPLASTY;  Surgeon: [name], MD;  Location: [facility] ORS;  Service: Orthopedics;  Laterality: Right;    There were no vitals filed for this visit.      Subjective Assessment - 12/29/16 1450    Subjective No new complaints. No falls or pain to report. Has returned to [facility] for one class: performed warm up, seated rest for 30 sec's, then got up and performed rest of hour long class without resting. Was tired when he got home with some neck soreness. Reports he recovered by next day. Going back on Friday.                                      Pertinent History arthritis, DM, HLD, HTN, a fib   Patient Stated Goals return to tai chi, get back to regular activity   Currently in Pain? No/denies             [facility] Adult PT Treatment/Exercise - 12/29/16 1503      High Level Balance   High Level Balance Activities Marching forwards;Marching backwards;Tandem walking  toe walk fwd/bwd, tandem walk fwd/bwd   High Level Balance Comments red mats next to counter top: 3 laps each/each way with intermittent UE support on counter and  cues on posture, ex form/weight shifting. min guard to min assist for balance.      Exercises   Other Exercises  with red band around ankles: hip abduction, hip extension and hip flexion with knee extension x 10 bil legs, UE support on coutner top.           Balance Exercises - 12/29/16 1515      Balance Exercises: Standing   Balance Beam seated with feet across blue foam beam: sit<>stands x 10 reps with cues on weight shifting and base of support with up to min assist needed: standing with feet across blue foam beam: alternating forward stepping to floor and back onto foam x 10 reps each leg, alternating backward stepping to floor and back onto beam x 10 reps, min to mod assist for balance with mirror used for visual feedback so to step high enough to clear foam with return stepping.                             PT Long Term Goals - 12/12/16 1325      PT LONG TERM GOAL #1   Title verbalize understanding of CVA risk factors/warning signs (Target Date for all LTGs: 12/08/16)   Baseline Met; 12/07/16.   Status Achieved     PT LONG TERM GOAL #2   Title independent with HEP / upgrade to independent with updated HEP (target 01/08/17)   Baseline Pt very little performance due to sickness, 12/07/16.   Time 4   Period Weeks   Status Revised     PT LONG TERM GOAL #3   Title improve BERG balance score to >/= 52/56 for improved balance    Baseline Scored 51/56, 12/07/16.   Time 4   Status On-going     PT LONG TERM GOAL #4   Title improve timed up and go to < 13 sec for improved function   Baseline 12.81 seconds, 12/07/16.   Status Achieved     PT LONG TERM GOAL #5   Title improve dynamic gait index to >/= 20/24 for improved mobility and decreased fall risk   Baseline 20/24 12/07/16.   Status Achieved     PT LONG TERM GOAL #6   Title Patient will demonstrate improved confidence in his balance as demonstrated by successful return to his tai chi class at least 1 time per week.   Baseline Patient has not returned for any classes (12/09/16). Previously did tai chi 3x/week x 13 years.   Time 4   Period Weeks   Status New            Plan - 12/29/16 1453    Clinical Impression Statement Today's skilled session reviewed the portion of HEP issued last session that pt has not been performing at home. Remainder of session focused on high level balance activities without any issues reported. Pt is making steady progress and on track to discharge next week per PT plan of care.   PT Treatment/Interventions ADLs/Self Care Home Management;Cryotherapy;Moist Heat;Neuromuscular re-education;Electrical Stimulation;Balance training;Therapeutic exercise;Therapeutic activities;Functional mobility training;Stair training;Gait training;Patient/family education;Vestibular   PT Next Visit Plan  check LTGs for anticipated discharge at next visit   Consulted and Agree with Plan of Care Patient      Patient will benefit from skilled therapeutic intervention in order to improve the following deficits and impairments:  Abnormal gait, Decreased balance, Decreased mobility, Difficulty walking, Decreased strength, Decreased activity tolerance  Visit Diagnosis: Other  abnormalities of gait and mobility  Muscle weakness (generalized)  Unsteadiness on feet     Problem List Patient Active Problem List   Diagnosis Date Noted  . Stroke ([place]) 10/26/2016  . Obese 05/07/2014  . Expected blood loss anemia 05/07/2014  . S/P right TKA 05/06/2014  . DJD (degenerative joint disease) of knee 03/25/2014  . Preop cardiovascular exam 03/19/2014  . Abnormal ECG 03/19/2014  . Hypertension   . Diabetes mellitus without complication ([place])   . Hypercholesteremia   . Arthritis   . Prostate cancer ([place])   . Sleep apnea     [place], [place], [facility] [address], [place], [place] [address] [phone] 12/30/16, 10:54 AM   Name: William [name] MRN: [id number] Date of Birth: Apr 05, 1934

## 2017-01-02 DIAGNOSIS — H35033 Hypertensive retinopathy, bilateral: Secondary | ICD-10-CM | POA: Diagnosis not present

## 2017-01-02 DIAGNOSIS — H2513 Age-related nuclear cataract, bilateral: Secondary | ICD-10-CM | POA: Diagnosis not present

## 2017-01-02 DIAGNOSIS — H40013 Open angle with borderline findings, low risk, bilateral: Secondary | ICD-10-CM | POA: Diagnosis not present

## 2017-01-02 DIAGNOSIS — E113293 Type 2 diabetes mellitus with mild nonproliferative diabetic retinopathy without macular edema, bilateral: Secondary | ICD-10-CM | POA: Diagnosis not present

## 2017-01-03 ENCOUNTER — Ambulatory Visit: Payer: Medicare Other | Admitting: Physical Therapy

## 2017-01-03 DIAGNOSIS — M6281 Muscle weakness (generalized): Secondary | ICD-10-CM

## 2017-01-03 DIAGNOSIS — R2689 Other abnormalities of gait and mobility: Secondary | ICD-10-CM

## 2017-01-03 DIAGNOSIS — R2681 Unsteadiness on feet: Secondary | ICD-10-CM | POA: Diagnosis not present

## 2017-01-03 NOTE — Therapy (Signed)
[facility] [address] [place], [place], [address] Phone: [phone]   Fax:  [phone]  Physical Therapy Treatment  Patient Details  Name: William [name] MRN: [id number] Date of Birth: 1934-05-11 Referring Provider: Jonetta Osgood (hospitalist); PCP: Jani Gravel, MD  Encounter Date: 01/03/2017      PT End of Session - 01/03/17 2230    Visit Number 12  G5   Number of Visits 13   Date for PT Re-Evaluation 01/08/17   Authorization Type Medicare   Authorization Time Period 12/09/16 to 02/07/17   Authorization - Visit Number 5   Authorization - Number of Visits 10  G Code Counter   PT Start Time 0848   PT Stop Time 0927   PT Time Calculation (min) 39 min   Equipment Utilized During Treatment Gait belt   Activity Tolerance Patient tolerated treatment well   Behavior During Therapy [facility] for tasks assessed/performed      Past Medical History:  Diagnosis Date  . Arthritis   . Diabetes mellitus without complication ([place])   . Hypercholesteremia   . Hypertension   . Prostate cancer [facility])    with radiation treatment  . Sleep apnea 2/14   "mild per patient" hasnt gotten CPAP machine  . Stroke [facility])     Past Surgical History:  Procedure Laterality Date  . ADENOIDECTOMY    . FLEXIBLE SIGMOIDOSCOPY N/A 09/12/2013   Procedure: FLEXIBLE SIGMOIDOSCOPY;  Surgeon: Milus Banister, MD;  Location: [facility] ENDOSCOPY;  Service: Endoscopy;  Laterality: N/A;  . HERNIA REPAIR    . HOT HEMOSTASIS N/A 09/12/2013   Procedure: HOT HEMOSTASIS (ARGON PLASMA COAGULATION/BICAP);  Surgeon: Milus Banister, MD;  Location: [facility];  Service: Endoscopy;  Laterality: N/A;  . LIPOMA EXCISION    . OTHER SURGICAL HISTORY     s/p prostate radiation  . TONSILLECTOMY    . TOTAL KNEE ARTHROPLASTY Left 03/25/2014   Procedure: LEFT TOTAL KNEE ARTHROPLASTY;  Surgeon: [name], MD;  Location: [facility] ORS;  Service: Orthopedics;  Laterality: Left;   . TOTAL KNEE ARTHROPLASTY Right 05/06/2014   Procedure: RIGHT TOTAL KNEE ARTHROPLASTY;  Surgeon: [name], MD;  Location: [facility];  Service: Orthopedics;  Laterality: Right;    There were no vitals filed for this visit.      Subjective Assessment - 01/03/17 0852    Subjective No new complaints. Has been to [place] classes and is very tired after classes. Reports he is back to doing all activities he was doing prior to CVA. He still gets a bit more fatigued than previously, however he has been "easing" back into his activities.    Pertinent History arthritis, DM, HLD, HTN, a fib   Patient Stated Goals return to tai chi, get back to regular activity   Currently in Pain? No/denies                         [facility] Adult PT Treatment/Exercise - 01/03/17 0001      Bed Mobility   Bed Mobility Supine to Sit;Sit to Supine   Rolling Right 7: Independent     Transfers   Transfers Sit to Stand;Stand to Sit   Sit to Stand 7: Independent   Stand to Sit 7: Independent     Ambulation/Gait   Ambulation/Gait Assistance 7: Independent   Ambulation Distance (Feet) 330 Feet   Assistive device None   Gait Pattern Lateral hip instability;Step-through  pattern;Lateral trunk lean to right;Lateral trunk lean to left;Wide base of support   Ambulation Surface Level;Indoor     Standardized Balance Assessment   Standardized Balance Assessment Berg Balance Test     Berg Balance Test   Sit to Stand Able to stand without using hands and stabilize independently   Standing Unsupported Able to stand safely 2 minutes   Sitting with Back Unsupported but Feet Supported on Floor or Stool Able to sit safely and securely 2 minutes   Stand to Sit Sits safely with minimal use of hands   Transfers Able to transfer safely, minor use of hands   Standing Unsupported with Eyes Closed Able to stand 10 seconds safely   Standing Ubsupported with Feet Together Able to place feet together independently and  stand 1 minute safely   From Standing, Reach Forward with Outstretched Arm Can reach confidently >25 cm (10")   From Standing Position, Pick up Object from Floor Able to pick up shoe safely and easily   From Standing Position, Turn to Look Behind Over each Shoulder Looks behind from both sides and weight shifts well   Turn 360 Degrees Able to turn 360 degrees safely in 4 seconds or less   Standing Unsupported, Alternately Place Feet on Step/Stool Able to stand independently and safely and complete 8 steps in 20 seconds   Standing Unsupported, One Foot in Front Able to place foot tandem independently and hold 30 seconds   Standing on One Leg Tries to lift leg/unable to hold 3 seconds but remains standing independently   Total Score 53     Knee/Hip Exercises: Standing   Hip Flexion Stengthening;Both;10 reps;Knee straight  alternating; 1 second hold   Hip Abduction Stengthening;Both;10 reps;Knee straight  1 second hold   Hip Extension Stengthening;Both;10 reps;Knee straight  alternating; 1 second hold     Knee/Hip Exercises: Supine   Bridges Limitations 10 reps wiht 5 sec hold; vc for breathing                PT Education - 01/03/17 2227    Education provided Yes   Education Details After discharge plan for continued exercises and gradual increase in his activity level to tolerance (i.e. his church commitment to open/close building; return to [employer] 3x/wk).    Person(s) Educated Patient   Methods Explanation;Demonstration   Comprehension Verbalized understanding;Returned demonstration             PT Long Term Goals - 01/03/17 2234      PT LONG TERM GOAL #1   Title verbalize understanding of CVA risk factors/warning signs (Target Date for all LTGs: 12/08/16)   Baseline Met; 12/07/16.   Status Achieved     PT LONG TERM GOAL #2   Title independent with HEP / upgrade to independent with updated HEP (target 01/08/17)   Baseline Pt very little performance due to sickness,  12/07/16.   Time 4   Period Weeks   Status Achieved     PT LONG TERM GOAL #3   Title improve BERG balance score to >/= 52/56 for improved balance    Baseline Scored 51/56, 12/07/16. scored 53/56 01/03/17   Time 4   Status Achieved     PT LONG TERM GOAL #4   Title improve timed up and go to < 13 sec for improved function   Baseline 12.81 seconds, 12/07/16.   Status Achieved     PT LONG TERM GOAL #5   Title improve dynamic gait index to >/=  20/24 for improved mobility and decreased fall risk   Baseline 20/24 12/07/16.   Status Achieved     PT LONG TERM GOAL #6   Title Patient will demonstrate improved confidence in his balance as demonstrated by successful return to his tai chi class at least 1 time per week.   Baseline Patient has not returned for any classes (12/09/16). Previously did tai chi 3x/week x 13 years.   Time 4   Period Weeks   Status Achieved               Plan - 01/03/17 2230    Clinical Impression Statement Patient has made excellent progress. Final LTG's assessed today and all goals met (6 of 6 goals). Pt has returned to [place] classes with good results.    Rehab Potential Good   PT Treatment/Interventions ADLs/Self Care Home Management;Cryotherapy;Moist Heat;Neuromuscular re-education;Electrical Stimulation;Balance training;Therapeutic exercise;Therapeutic activities;Functional mobility training;Stair training;Gait training;Patient/family education;Vestibular   PT Next Visit Plan check LTGs for anticipated discharge at next visit   Consulted and Agree with Plan of Care Patient      Patient will benefit from skilled therapeutic intervention in order to improve the following deficits and impairments:  Abnormal gait, Decreased balance, Decreased mobility, Difficulty walking, Decreased strength, Decreased activity tolerance  Visit Diagnosis: Other abnormalities of gait and mobility  Muscle weakness (generalized)     Problem List Patient Active Problem  List   Diagnosis Date Noted  . Stroke ([place]) 10/26/2016  . Obese 05/07/2014  . Expected blood loss anemia 05/07/2014  . S/P right TKA 05/06/2014  . DJD (degenerative joint disease) of knee 03/25/2014  . Preop cardiovascular exam 03/19/2014  . Abnormal ECG 03/19/2014  . Hypertension   . Diabetes mellitus without complication ([place])   . Hypercholesteremia   . Arthritis   . Prostate cancer ([place])   . Sleep apnea    PHYSICAL THERAPY DISCHARGE SUMMARY  Visits from Start of Care: 12  Current functional level related to goals / functional outcomes: See above   Remaining deficits: Patient with continued bil hip weakness which impairs his gait. Patient has HEP to continue to strengthen hips.   Education / Equipment: Theraband; printed HEP   Plan: Patient agrees to discharge.  Patient goals were met. Patient is being discharged due to meeting the stated rehab goals.  ?????        Rexanne Mano, PT 01/03/2017, 10:38 PM  [facility] [address] [place], [place], [address] Phone: [phone]   Fax:  [phone]  Name: William [name] MRN: [id number] Date of Birth: 1934-03-06

## 2017-01-05 ENCOUNTER — Ambulatory Visit (INDEPENDENT_AMBULATORY_CARE_PROVIDER_SITE_OTHER): Payer: Medicare Other | Admitting: Neurology

## 2017-01-05 ENCOUNTER — Encounter: Payer: Self-pay | Admitting: Neurology

## 2017-01-05 VITALS — BP 173/88 | HR 68 | Resp 18 | Ht 73.0 in | Wt 230.0 lb

## 2017-01-05 DIAGNOSIS — G4733 Obstructive sleep apnea (adult) (pediatric): Secondary | ICD-10-CM | POA: Diagnosis not present

## 2017-01-05 DIAGNOSIS — I480 Paroxysmal atrial fibrillation: Secondary | ICD-10-CM | POA: Diagnosis not present

## 2017-01-05 DIAGNOSIS — E1151 Type 2 diabetes mellitus with diabetic peripheral angiopathy without gangrene: Secondary | ICD-10-CM | POA: Diagnosis not present

## 2017-01-05 DIAGNOSIS — I63412 Cerebral infarction due to embolism of left middle cerebral artery: Secondary | ICD-10-CM | POA: Diagnosis not present

## 2017-01-05 NOTE — Progress Notes (Signed)
SLEEP MEDICINE CLINIC   Provider:  Larey Seat, M D  Referring Provider: Jani Gravel, MD Primary Care Physician:  Jani Gravel, MD  Chief Complaint  Patient presents with  . Sleep Consult    Rm 11. Patient states that he had a sleep study "many years ago". Pt wakes up many times during the night, h/o snoring until getting a mattress that raises the head, recently had a stroke in December.     HPI:  William [name] is a 83 y.o. male , seen here as a referral from Dr. Maudie [name] for a sleep study.  Dear Dr. Maudie [name],  Allow me to summarize William [name] health history, recent stroke within the last 90 days, diabetes mellitus, newly diagnosed atrial fibrillation, insulin-dependent diabetes, hyperlipidemia, chronic anticoagulation, urinary frequency on medications. Prostate cancer status post radiation in 2012 with Dr. Janae [name], history of surgery in 1960 for ventral hernia and later for left inguinal hernia. Dr. Maudie [name], you  initiated this referral for sleep medicine consultation based on the patient's new diagnosis of atrial fibrillation suffering a stroke on 10/29/2016.  There have been sleep concerns for while including snoring, fragmented sleep, and multiple nocturia. He reports also some recent memory loss, a tendency to bruise and hearing loss. William [name] had undergone a sleep study at [facility] before he suffered stroke, atrial fibrillation on 06/06/2013, at the time his AHI was 9.1, REM associated apnea moderate snoring, severe limb jerks with arousals, excessive daytime sleepiness was quoted at an Epworth score of 12 and his BMI was 34 at the time. Oxygen desaturation nadir was 76%. It was recommended to pursue weight loss was positional therapy and REM suppressants. CPAP was the first line of treatment, which the patient declined . Interestingly there was a second home sleep test from 01/09/2013 at the time his AHI was 30, RDI 32, oxygen desaturation index was interestingly lower than the AHI, which is  highly unusual he did have bradycardia and tachycardia but no significant clinically relevant oxygen desaturations. It seems that the home sleep test was a screening test , and followed by the in lab study in July of the same year.  I quote here this patient's last encounter with his stroke physician : "[name] with embolic left frontal MCA branch infarct from atrial fibrillation in December  2017 who is doing very well with without any residual deficits. Vascular risk factors of atrial fibrillation, hypertension, diabetes and hyperlipidemia. Embolic stroke on [id number]  from atrial fibrillation, newly diagnosed atrial fibrillation posing risk for recurrent stroke/TIAs, Dr. Leonie [name] asked the patient to continue Eliquis (apixaban) daily for secondary stroke prevention and maintain strict control of hypertension with blood pressure goal below 130/90, diabetes with hemoglobin A1c goal below 6.5% and lipids with LDL cholesterol goal below 70 mg/dL.  Dr William [name]  advised the patient to eat a healthy diet with plenty of whole grains, cereals, fruits and vegetables, exercise regularly and maintain ideal body weight Followup in the future with  nurse practitioner in 6 months or call earlier if necessary. William Contras, MD  Sleep habits are as follows: Usual bedtime  is between 9:15 9:45 PM,  His wife follows later - at 84 PM , normally the patient is asleep promptly, he uses a bed that has the head of bed elevated, sleeps on one pillow, sleeps mostly on his side. Prefers his left the bedroom is cool, quiet and dark, his sleep is however interrupted multiple times at night by bathroom calls.  His wife kept a chart of his sleep habits and his bathroom breaks, nocturia is as frequent as 7 times at night !, Now it has also become a daytime frequency problem. The patient has been followed by urology ever since being diagnosed with prostate cancer. Myrbetriq and Flomax were not helpful.  He rises around 5:15 AM  every morning including weekends. His estimated sleep time is less than 6 hours given that he has multiple interventions. His wife sleeps until 8 AM>  But he dozes off in the afternoon after lunchtime and in the evening. His wife feels that from 4:00 on he is more likely to be asleep than awake. If he is not mentally stimulated or physically active in the afternoons, he is very likely to doze off.  Social history:  Married, no Tobacco use, no ETOH use, caffeine use ; 1 cup of coffee in AM, no sodas ( quit 8 years ago ) .  Review of Systems: Out of a complete 14 system review, the patient complains of only the following symptoms, and all other reviewed systems are negative.  Nocturia, urinary frequency, both knees replaced, snoring, memory loss, easy bruising, hearing loss, recent stroke with no residual deficits.  Epworth score 8 , Fatigue severity score 14  , depression score 1/15    Social History   Social History  . Marital status: Married    Spouse name: N/A  . Number of children: N/A  . Years of education: N/A   Occupational History  . Not on file.   Social History Main Topics  . Smoking status: Former Smoker    Start date: 11/28/1982  . Smokeless tobacco: Never Used  . Alcohol use 0.6 oz/week    1 Shots of liquor per week     Comment: occasionally  . Drug use: No  . Sexual activity: Not on file   Other Topics Concern  . Not on file   Social History Narrative  . No narrative on file    Family History  Problem Relation Age of Onset  . Colon cancer Father   . Stroke Maternal Grandmother   . Esophageal cancer Neg Hx   . Rectal cancer Neg Hx   . Stomach cancer Neg Hx     Past Medical History:  Diagnosis Date  . Arthritis   . Diabetes mellitus without complication ([place])   . Hypercholesteremia   . Hypertension   . Prostate cancer [facility])    with radiation treatment  . Sleep apnea 2/14   "mild per patient" hasnt gotten CPAP machine  . Stroke [facility])     Past  Surgical History:  Procedure Laterality Date  . ADENOIDECTOMY    . FLEXIBLE SIGMOIDOSCOPY N/A 09/12/2013   Procedure: FLEXIBLE SIGMOIDOSCOPY;  Surgeon: Milus Banister, MD;  [facility];  Service: Endoscopy;  Laterality: N/A;  . HERNIA REPAIR    . HOT HEMOSTASIS N/A 09/12/2013   Procedure: HOT HEMOSTASIS (ARGON PLASMA COAGULATION/BICAP);  Surgeon: Milus Banister, MD;  Location: [facility];  Service: Endoscopy;  Laterality: N/A;  . LIPOMA EXCISION    . OTHER SURGICAL HISTORY     s/p prostate radiation  . TONSILLECTOMY    . TOTAL KNEE ARTHROPLASTY Left 03/25/2014   Procedure: LEFT TOTAL KNEE ARTHROPLASTY;  Surgeon: [name], MD;  [facility];  Service: Orthopedics;  Laterality: Left;  . TOTAL KNEE ARTHROPLASTY Right 05/06/2014   Procedure: RIGHT TOTAL KNEE ARTHROPLASTY;  Surgeon: [name], MD;  [facility];  Service: Orthopedics;  Laterality: Right;    Current Outpatient Prescriptions  Medication Sig Dispense Refill  . amoxicillin (AMOXIL) 500 MG capsule Take 2,000 mg by mouth as directed. 1 hour prior to dental procedures.    Marland Kitchen apixaban (ELIQUIS) 5 MG TABS tablet Take 1 tablet (5 mg total) by mouth 2 (two) times daily. 30 tablet 0  . co-enzyme Q-10 30 MG capsule Take 300 mg by mouth daily.    . Glucosamine-Chondroitin 1500-1200 MG/30ML LIQD Take 1 tablet by mouth 2 (two) times daily.    Marland Kitchen HUMALOG KWIKPEN 100 UNIT/ML KiwkPen Inject 2-8 Units into the skin 3 (three) times daily.     Marland Kitchen LEVEMIR FLEXTOUCH 100 UNIT/ML Pen Inject 16 Units into the skin daily at 10 pm. As directed.     Marland Kitchen lisinopril-hydrochlorothiazide (PRINZIDE,ZESTORETIC) 10-12.5 MG per tablet Take 1 tablet by mouth every morning.     . metFORMIN (GLUCOPHAGE) 1000 MG tablet Take 1,000 mg by mouth 2 (two) times daily with a meal.    . ONGLYZA 5 MG TABS tablet Take 5 mg by mouth at bedtime.     . pravastatin (PRAVACHOL) 40 MG tablet Take 40 mg by mouth daily with supper.      No current  facility-administered medications for this visit.     Allergies as of 01/05/2017 - Review Complete 01/05/2017  Allergen Reaction Noted  . Glyburide Other (See Comments) 08/06/2013    Vitals: BP (!) 173/88   Pulse 68   Resp 18   Ht 6\' 1"  (1.854 m)   Wt 230 lb (104.3 kg)   BMI 30.34 kg/m  Last Weight:  Wt Readings from Last 1 Encounters:  01/05/17 230 lb (104.3 kg)   [id number] mass index is 30.34 kg/m.     Last Height:   Ht Readings from Last 1 Encounters:  01/05/17 6\' 1"  (1.854 m)    Physical exam:  General: The patient is awake, alert and appears not in acute distress. The patient is well groomed. Head: Normocephalic, atraumatic. Neck is supple. Mallampati 3,  neck circumference:16.5. Nasal airflow  patent. Retrognathia is seen.  Cardiovascular:  Regular rate and rhythm at this time , without  murmurs or carotid bruit, and without distended neck veins. Respiratory: Lungs are clear to auscultation. Skin:  Without evidence of edema, or rash Trunk: BMI is 30. The patient's posture is erect  Neurologic exam : The patient is awake and alert, oriented to place and time.   Mood and affect are appropriate.  Cranial nerves: Pupils are equal and briskly reactive to light. Funduscopic exam - Cataract developing Extraocular movements  in vertical and horizontal planes intact and without nystagmus. Visual fields by finger perimetry are intact.Hearing to finger rub intact with hearing aids. Facial sensation intact to fine touch.Facial motor strength is symmetric and tongue and uvula move midline. Shoulder shrug was symmetrical.   Motor exam:   Normal tone, but atrophy of muscle bulk- in triceps and deltoid.  - there is less  strength in lower  Extremities, hip flexion. No fasciculation , strong grip.   Sensory:  Fine touch, pinprick and vibration were tested in all extremities. Proprioception tested in the upper extremities was normal.  Coordination: Rapid alternating movements in the  fingers/hands was normal.  Finger-to-nose maneuver  normal without evidence of ataxia, dysmetria or tremor.  Gait and station: Patient walks without assistive device and is able unassisted to climb up to the exam table. Strength within normal limits.  Stance is stable and normal.  Deep tendon reflexes: in the  upper and lower extremities are symmetric and intact. Babinski maneuver response is downgoing.  The patient was advised of the nature of the diagnosed sleep disorder , the treatment options and risks for general a health and wellness arising from not treating the condition.  I spent more than 40 minutes of face to face time with the patient. Greater than 50% of time was spent in counseling and coordination of care. We have discussed the diagnosis and differential and I answered the patient's questions.     Assessment:  After physical and neurologic examination, review of laboratory studies,  Personal review of imaging studies, reports of other /same  Imaging studies ,  Results of polysomnography/ neurophysiology testing and pre-existing records as far as provided in visit., my assessment is   1) Mr. Padley recent diagnosis of embolic stroke following paroxysmal onset of atrial fibrillation makes and attended sleep study necessary. Untreated obstructive sleep apnea is one of the key reasons for the onset of atrial fibrillation. He had already tachycardia and bradycardia cardia noted in a home sleep test in 2014 which could have been a hint. His acute stroke workup of December 2017 included echocardiogram, cardiac monitor, lipid panel, metabolic panel CBC and differential and the recommendation for ongoing anticoagulation. The imaging studies also show a former remote infarction that was clinically silent.  PSG SPLIT ordered, with special attention to heart rate,  Nocturia is very bothersome to him and fragments his night. It has been attributed to his prostate cancer but this frequency may also be  exacerbated by untreated sleep apnea. Therefore an attended split-night polysomnography will be ordered.   Asencion Partridge Elwyn Klosinski MD  01/05/2017   CC: Jani Gravel, Md [address] [place], [place] [address]   CC William Contras, MD

## 2017-01-09 ENCOUNTER — Ambulatory Visit (INDEPENDENT_AMBULATORY_CARE_PROVIDER_SITE_OTHER): Payer: Medicare Other | Admitting: Neurology

## 2017-01-09 DIAGNOSIS — G4733 Obstructive sleep apnea (adult) (pediatric): Secondary | ICD-10-CM | POA: Diagnosis not present

## 2017-01-09 DIAGNOSIS — I480 Paroxysmal atrial fibrillation: Secondary | ICD-10-CM

## 2017-01-09 DIAGNOSIS — E1151 Type 2 diabetes mellitus with diabetic peripheral angiopathy without gangrene: Secondary | ICD-10-CM

## 2017-01-09 DIAGNOSIS — I63412 Cerebral infarction due to embolism of left middle cerebral artery: Secondary | ICD-10-CM

## 2017-01-17 ENCOUNTER — Telehealth: Payer: Self-pay | Admitting: Neurology

## 2017-01-17 ENCOUNTER — Encounter: Payer: Self-pay | Admitting: Neurology

## 2017-01-17 DIAGNOSIS — G4733 Obstructive sleep apnea (adult) (pediatric): Secondary | ICD-10-CM

## 2017-01-17 DIAGNOSIS — I6310 Cerebral infarction due to embolism of unspecified precerebral artery: Secondary | ICD-10-CM

## 2017-01-17 DIAGNOSIS — G4734 Idiopathic sleep related nonobstructive alveolar hypoventilation: Secondary | ICD-10-CM

## 2017-01-17 DIAGNOSIS — I480 Paroxysmal atrial fibrillation: Secondary | ICD-10-CM

## 2017-01-17 NOTE — Telephone Encounter (Signed)
William [name], please share the results with the referring physician as always.  I have ordered a CPAP titration.   IMPRESSION:  1. Obstructive Sleep Apnea(OSA) with an AHI of 37.9 - severe - and positional Ahi of 67.9 in supine. 2. Hypoxemia and brief period of atrial PACs, and bradycardia.  3. Few PLM arousals. 4. Loud snoring, also positional dependent.  5. 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 Advise full-night, attended, CPAP titration study to optimize therapy.   2. Positional therapy is advised. 3. Avoid sedative-hypnotics which may worsen sleep apnea, alcohol and tobacco (as applicable). 4. Further information regarding OSA may be obtained from [employer] (www.sleepfoundation.org) or American Sleep Apnea Association (www.sleepapnea.org). 5. A follow up appointment will be scheduled in the Sleep Clinic at [facility]. The referring provider will be notified of the results.      I certify that I have reviewed the entire raw data recording prior to the issuance of this report in accordance with the Standards of Accreditation of the [place] [employer] (AASM)

## 2017-01-17 NOTE — Procedures (Signed)
PATIENT'S NAME:  William [name], William [name] DOB:      10-01-1934      MR#:    [id number]     DATE OF RECORDING: 01/09/2017 REFERRING M.D.:  Jani Gravel, MD Study Performed:   Baseline Polysomnogram HISTORY: William [name] is a 83 y.o. male , seen here with recent stroke within the last 26 days, diabetes mellitus, newly diagnosed atrial fibrillation, insulin-dependent diabetes, hyperlipidemia, chronic anticoagulation, urinary frequency on medications. Prostate cancer status post radiation in 2012, history of surgery in 1960 for ventral hernia and later for left inguinal hernia. Dr. Maudie Mercury initiated this referral for sleep medicine consultation based on the patient's new diagnosis of atrial fibrillation suffering a stroke on 10/29/2016.  There have been sleep concerns for while including snoring, fragmented sleep, and Nocturia.  William [name] had undergone  a home sleep test on 01/09/2013 ,at the time his AHI was 30, RDI 32,. It seems that the home sleep test was a screening test, and followed by the in lab study 06/06/2013. At that time his AHI was 9.1, REM associated apnea, moderate snoring, and severe limb jerks with arousals. Oxygen desaturation nadir was 76%. CPAP was the first line of treatment, which the patient at that time declined. It was recommended to pursue weight loss, positional therapy and REM suppressants.  The patient endorsed the Epworth Sleepiness Scale at 8/24 points.    The patient's weight 230 pounds with a height of 73 (inches), resulting in a BMI of 30.4 kg/m2.  The patient's neck circumference measured 16.5 inches.  CURRENT MEDICATIONS: Amoxil, Co-Enzyme, Glucosamine-Chondroitin, Humalog Kwikpen, Levermir Flex touch, Prinizide, Glucophage, Onglyza, Pravachol.   PROCEDURE:  This is a multichannel digital polysomnogram utilizing the Somnostar 11.2 system.  Electrodes and sensors were applied and monitored per AASM Specifications.   EEG, EOG, Chin and Limb EMG, were sampled at 200 Hz.  ECG, Snore and  Nasal Pressure, Thermal Airflow, Respiratory Effort, CPAP Flow and Pressure, Oximetry was sampled at 50 Hz. Digital video and audio were recorded.      BASELINE STUDY  Lights Out was at 21:49 and Lights On at 04:55.  Total recording time (TRT) was 426 minutes, with a total sleep time (TST) of  353.5 minutes.   The patient's sleep latency was 13 minutes.  REM latency was 79 minutes.  The sleep efficiency was 83. %.     SLEEP ARCHITECTURE: WASO (Wake after sleep onset) was 66.5 minutes.  There were 20.5 minutes in Stage N1, 281.5 minutes Stage N2, 0 minutes Stage N3 and 51.5 minutes in Stage REM.  The percentage of Stage N1 was 5.8%, Stage N2 was 79.6%, Stage N3 was 0% and Stage R (REM sleep) was 14.6%.   RESPIRATORY ANALYSIS:  There were 223 respiratory events:  76 obstructive apneas, 0 central apneas and 147 hypopneas with 0 respiratory event related arousals (RERAs).    The total APNEA/HYPOPNEA INDEX (AHI) was 37.9/hour and the total RESPIRATORY DISTURBANCE INDEX was 37.9 /hour.  11 events occurred in REM sleep and 278 events in NREM.  The REM AHI was 12.8 /hour, versus a non-REM AHI of 42.1. The patient spent 11.5 minutes of total sleep time in the supine position and 342 minutes in non-supine.  The supine AHI was 67.9 versus a non-supine AHI of 36.8.  OXYGEN SATURATION & C02:  The Wake baseline 02 saturation was 95%, with the lowest being 72%. Time spent below 89% saturation equaled 32 minutes.   PERIODIC LIMB MOVEMENTS:   The patient had  a total of 173 Periodic Limb Movements.  The Periodic Limb Movement (PLM) index was 29.4 and the PLM Arousal index was 1.5/hour. The arousals were noted as: 45 were spontaneous, 9 were associated with PLMs, and 13 were associated with respiratory events.   Audio and video analysis did not show any abnormal or unusual movements, behaviors, phonations or vocalizations.  Loud Snoring was noted.  EKG was in keeping with normal sinus rhythm (NSR) until the patient  entered REM sleep. There were variable QRS intervals noted.  IMPRESSION:  1. Obstructive Sleep Apnea(OSA) with hypoxemia and brief period of atrial PACs, and bradycardia.  2. Few PLM arousals. 3. Loud snoring, also positional dependent.  4. 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 Advise full-night, attended, CPAP titration study to optimize therapy.   2. Positional therapy <choose one> be advised. 3. Avoid sedative-hypnotics which may worsen sleep apnea, alcohol and tobacco (as applicable). 4. Further information regarding OSA may be obtained from [employer] (www.sleepfoundation.org) or American Sleep Apnea Association (www.sleepapnea.org). 5. A follow up appointment will be scheduled in the Sleep Clinic at [facility]. The referring provider will be notified of the results.      I certify that I have reviewed the entire raw data recording prior to the issuance of this report in accordance with the Standards of Accreditation of the American Academy of Sleep Medicine (AASM)      Larey Seat, MD  01-17-2017  Diplomat, American Board of Psychiatry and Neurology  Diplomat, American Board of [place] Director, [place] [facility] at [employer]

## 2017-01-18 NOTE — Telephone Encounter (Signed)
-----   Message from Larey Seat, MD sent at 01/17/2017 12:18 PM EST ----- IMPRESSION:  1. Obstructive Sleep Apnea(OSA) with an AHI of 37.9 - severe - and positional Ahi of 67.9 in supine. 2. Hypoxemia and brief period of atrial PACs, and bradycardia.  3. Few PLM arousals. 4. Loud snoring, also positional dependent.  5. 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 Advise full-night, attended, CPAP titration study to optimize therapy.   2. Positional therapy is advised. 3. Avoid sedative-hypnotics which may worsen sleep apnea, alcohol and tobacco (as applicable). 4. Further information regarding OSA may be obtained from [employer] (www.sleepfoundation.org) or American Sleep Apnea Association (www.sleepapnea.org). 5. A follow up appointment will be scheduled in the Sleep Clinic at [facility]. The referring provider will be notified of the results.      I certify that I have reviewed the entire raw data recording prior to the issuance of this report in accordance with the Standards of Accreditation of the [place] Academy of Sleep Medicine (AASM)

## 2017-01-18 NOTE — Telephone Encounter (Signed)
I spoke to daughter and gave results and recommendations. She voiced understanding and will relay to the patient. She is aware that our sleep lab will call to schedule titration study.  I sent copy of report to PCP.

## 2017-01-26 ENCOUNTER — Ambulatory Visit (INDEPENDENT_AMBULATORY_CARE_PROVIDER_SITE_OTHER): Payer: Medicare Other | Admitting: Neurology

## 2017-01-26 DIAGNOSIS — G4733 Obstructive sleep apnea (adult) (pediatric): Secondary | ICD-10-CM

## 2017-01-26 DIAGNOSIS — G4734 Idiopathic sleep related nonobstructive alveolar hypoventilation: Secondary | ICD-10-CM

## 2017-01-26 DIAGNOSIS — I6310 Cerebral infarction due to embolism of unspecified precerebral artery: Secondary | ICD-10-CM

## 2017-01-26 DIAGNOSIS — I480 Paroxysmal atrial fibrillation: Secondary | ICD-10-CM

## 2017-01-30 ENCOUNTER — Telehealth: Payer: Self-pay | Admitting: Neurology

## 2017-01-30 DIAGNOSIS — G4731 Primary central sleep apnea: Secondary | ICD-10-CM

## 2017-01-30 DIAGNOSIS — G4739 Other sleep apnea: Secondary | ICD-10-CM

## 2017-01-30 NOTE — Telephone Encounter (Signed)
Patient called office returning nurse's call in reference to sleep study results.  Per patient if no one answers please leave a message.

## 2017-01-30 NOTE — Telephone Encounter (Signed)
I called pt to discuss his sleep study results. No answer, left a message asking him to call me back. 

## 2017-01-30 NOTE — Telephone Encounter (Signed)
The arousals were noted as: 59 were spontaneous, 6 were associated with PLMs, and 67 were associated with respiratory events.  Audio and video analysis did not show any abnormal or unusual movements, behaviors, phonations or vocalizations.   The patient took 7 (!) bathroom breaks. Snoring was noted. EKG was not in sinus rhythm, rate controlled atrial fibrillation.  The Patient was fitted with a ResMed AirTouch interface ( FFM) in medium size.  DIAGNOSIS 1. Complex apnea, with a dominant form of Central Sleep Apnea, treatment emergent.  2.  Reduced sleep efficiency, sleep fragmentation. 3. Partial response to BiPAP ST, but time for titration was limited.    PLANS/RECOMMENDATIONS: return for full night titration ASV versus BiPAP ST.

## 2017-01-30 NOTE — Telephone Encounter (Signed)
I called pt. I advised him that his sleep study revealed complex and central sleep apnea which needs to be treated with either a bipap or an ASV. Pt says that he usually goes to the bathroom 6-7 times per night, so the 7 bathroom breaks that he had during his sleep study is normal for him at this time. Pt is agreeable to returning for a bipap/asv titration study. Pt knows that our sleep lab will call him to get this study scheduled. Pt verbalized understanding of results. Pt had no questions at this time but was encouraged to call back if questions arise.

## 2017-01-30 NOTE — Procedures (Signed)
PATIENT'S NAME:  William [name], William [name] DOB:      December 17, 1933      MR#:    [id number]     DATE OF RECORDING: 01/26/2017 REFERRING M.D.:  Jani Gravel, MD Study Performed:   CPAP  Titration HISTORY:  Mr. Weightman, a patient with left MCA stroke and newly diagnosed atrial fibrillation, returns for a CPAP titration following a PSG performed on 01/09/2017, which resulted in AHI of 37.9, REM AHI of 12.8, supine AHI of 67.9 with SPO2 nadir of 72%. SpO2 Desaturation time was 32 minutes.  Medical conditions of Arthritis, Diabetes type 2, Hypocholesteremia, Hypertension, Sleep apnea, Stroke. The patient endorsed the Epworth Sleepiness Scale at 8 points.  The patient's weight 229 pounds with a height of 73 (inches), resulting in a BMI of 30.4 kg/m2.The patient's neck circumference measured 16 inches.  CURRENT MEDICATIONS: Amoxil, Co-Enzyme, Glucosamine-Chondroitin, Humalog Kwikpen, Levemir Prinizide, Glucophage, Onglyza, Pravachol.    PROCEDURE:  This is a multichannel digital polysomnogram utilizing the SomnoStar 11.2 system.  Electrodes and sensors were applied and monitored per AASM Specifications.   EEG, EOG, Chin and Limb EMG, were sampled at 200 Hz.  ECG, Snore and Nasal Pressure, Thermal Airflow, Respiratory Effort, CPAP Flow and Pressure, Oximetry was sampled at 50 Hz. Digital video and audio were recorded.       CPAP was initiated at 5 cmH20 with heated humidity per AASM split night standards and pressure was advanced to 11cmH20, but failed to resolve or alleviate the AHI. The Technologist advanced to BiPAP settings of 12/8 cm water, but the AHI remained in the 2 digits.  Finally, she attempted to use BiPAP ST, and was met with some success. The best results were seen at 18 ST , with 12/8 cm water and a nadir of 90% , AHI of 0.0, but the sleep time was only 13 minutes.  Lights Out was at 21:02 and Lights On at 04:43. Total recording time (TRT) was 462 minutes, with a total sleep time (TST) of 314 minutes. The  patient's sleep latency was 24.5 minutes with 3 minutes of wake time after sleep onset. REM latency was 124 minutes.  The sleep efficiency was 68. %.    SLEEP ARCHITECTURE: WASO (Wake after sleep onset) was 139 minutes.  There were 69.5 minutes in Stage N1, 176 minutes Stage N2, 33.5 minutes Stage N3 and 35 minutes in Stage REM.  The percentage of Stage N1 was 22.1%, Stage N2 was 56.1%, Stage N3 was 10.7% and Stage R (REM sleep) was 11.1%. The sleep architecture was notable for fragmented sleep.  RESPIRATORY ANALYSIS:  There was a total of 111 respiratory events: 0 obstructive apneas, 20 central apneas and 0 mixed apneas with a total of 20 apneas and an apnea index (AI) of 3.8 /hour.  There were 91 hypopneas with a hypopnea index of 17.4/hour. The patient also had 6 respiratory event related arousals (RERAs).     The total APNEA/HYPOPNEA INDEX (AHI) was 21.2 /hour and the total RESPIRATORY DISTURBANCE INDEX was 22.4/hr.  5 events occurred in REM sleep and 106 events in NREM. The REM AHI was 8.6 /hour versus a non-REM AHI of 22.8 /hour.  The patient spent 314 minutes of total sleep time in the supine position and 0 minutes in non-supine. The supine AHI was 21.2, versus a non-supine AHI of 0.0.  OXYGEN SATURATION & C02:  The baseline 02 saturation was 96%, with the lowest being 84%. Time spent below 89% saturation equaled 15 minutes.  PERIODIC LIMB  MOVEMENTS:   The patient had a total of 64 Periodic Limb Movements. The Periodic Limb Movement (PLM) index was 12.2 and the PLM Arousal index was 1.1 /hour. The arousals were noted as: 59 were spontaneous, 6 were associated with PLMs, and 67 were associated with respiratory events.  Audio and video analysis did not show any abnormal or unusual movements, behaviors, phonations or vocalizations.   The patient took 7 (!) bathroom breaks. Snoring was noted. EKG was not in sinus rhythm, rate controlled atrial fibrillation.  The Patient was fitted with a ResMed  AirTouch interface ( FFM) in medium size.  DIAGNOSIS 1. Complex apnea, with a dominant form of Central Sleep Apnea, treatment emergent.  2.  Reduced sleep efficiency, sleep fragmentation. 3. Partial response to BiPAP ST, but time for titration was limited.    PLANS/RECOMMENDATIONS: return for full night titration ASV versus BiPAP ST.   A follow up appointment will be scheduled in the Sleep Clinic at [facility].   Please call [phone] with any questions.      I certify that I have reviewed the entire raw data recording prior to the issuance of this report in accordance with the Standards of Accreditation of the American Academy of Sleep Medicine (AASM)      Larey Seat, M.D.  01-30-2017 Diplomat, American Board of Psychiatry and Neurology  Diplomat, [place] of Sleep Medicine Medical Director, [place] [facility] at [facility]

## 2017-02-06 DIAGNOSIS — E119 Type 2 diabetes mellitus without complications: Secondary | ICD-10-CM | POA: Diagnosis not present

## 2017-02-06 DIAGNOSIS — I10 Essential (primary) hypertension: Secondary | ICD-10-CM | POA: Diagnosis not present

## 2017-02-14 ENCOUNTER — Ambulatory Visit (INDEPENDENT_AMBULATORY_CARE_PROVIDER_SITE_OTHER): Payer: Medicare Other | Admitting: Neurology

## 2017-02-14 ENCOUNTER — Telehealth: Payer: Self-pay

## 2017-02-14 DIAGNOSIS — G4739 Other sleep apnea: Secondary | ICD-10-CM | POA: Diagnosis not present

## 2017-02-14 DIAGNOSIS — G4731 Primary central sleep apnea: Secondary | ICD-10-CM

## 2017-02-21 ENCOUNTER — Telehealth: Payer: Self-pay

## 2017-02-21 NOTE — Addendum Note (Signed)
Addended by: Larey Seat on: 02/21/2017 01:02 PM   Modules accepted: Orders

## 2017-02-21 NOTE — Telephone Encounter (Signed)
-----   Message from Larey Seat, MD sent at 02/21/2017  1:02 PM EDT ----- DIAGNOSIS 1. Complex Sleep Apnea failed to respond to CPAP, BiPAP but patient tolerated ASV.  2. Periodic Limb Movement Disorder persisted, 8.7 arousals per hour (severe).  3. Reduced sleep efficiency, secondary to Nocturia and PLMs.   PLANS/RECOMMENDATIONS: ASV with settings of : EPAP 8 cm water, max PS of 15 cm water, minimum at 3 cm water, on FFM Airtouch F 20 in large size.  Patient slept 157 minutes with nadir at 84% SpO2, AHI of 2.3 .   DISCUSSION: 1)Follow up after 90 days of ASV use.  2)The PLMs need to be treated separately if these do not respond to PAP therapy. Dopaminergic agonists can be used in treatment, iron studies will be ordered.

## 2017-02-21 NOTE — Procedures (Signed)
PATIENT'S NAME:  William [name], William [name] DOB:      09-13-1934      MR#:    [id number]     DATE OF RECORDING: 02/14/2017 REFERRING M.D.:  Jani Gravel, MD Study Performed:   ASV - PAP Titration HISTORY:  William [name], a patient with left MCA stroke and newly diagnosed atrial fibrillation, returns for a CPAP titration following a PSG performed on 01/09/2017 at [facility], which resulted in AHI of 37.9, REM AHI of 12.8, supine AHI of 67.9 with SPO2 nadir of 72%. SpO2 Desaturation time was 32 minutes. A CPAP trial failed to control apneas and he returns now for ASV.     Medical conditions of Arthritis, Diabetes type 2, Hypocholesteremia, Hypertension, Sleep apnea, Stroke.   The patient endorsed the Epworth Sleepiness Scale at 8 points.  The patient's weight 229 pounds with a height of 73 (inches), resulting in a BMI of 30.4 kg/m2.The patient's neck circumference measured 16 inches.  CURRENT MEDICATIONS: Amoxil, Co-Enzyme, Glucosamine-Chondroitin,  Humalog Kwikpen, Levermir, Prinizide, Glucophage, Onglyza,  Pravachol.   PROCEDURE:  This is a multichannel digital polysomnogram utilizing the SomnoStar 11.2 system.  Electrodes and sensors were applied and monitored per AASM Specifications.   EEG, EOG, Chin and Limb EMG, were sampled at 200 Hz.  ECG, Snore and Nasal Pressure, Thermal Airflow, Respiratory Effort, CPAP Flow and Pressure, Oximetry was sampled at 50 Hz. Digital video and audio were recorded.      ASV PAP was initiated at 5 cmH20 with heated humidity per AASM split night standards and pressure was advanced to 15/8 cmH20 because of hypopneas, apneas and desaturations.  At an ASV  PAP pressure of EPAP 8 cm water, Maximum Pressure support of 15 and minimum of 3 cm water there was a reduction in AHI to 2.3/hr. The patient used a ResMed Airtouch F20 FFM in large size.    Lights Out was at 21:24 and Lights On at 05:06. Total recording time (TRT) was 462 minutes, with a total sleep time (TST) of 296 minutes. The patient's  sleep latency was 40.5 minutes with 8.5 minutes of wake time after sleep onset. REM latency was 144.5 minutes.  The sleep efficiency was 64.1 %.    SLEEP ARCHITECTURE: WASO (Wake after sleep onset) was 143 minutes.  There were 14.5 minutes in Stage N1, 238 minutes Stage N2, 7.5 minutes Stage N3 and 36 minutes in Stage REM.  The percentage of Stage N1 was 4.9%, Stage N2 was 80.4%, Stage N3 was 2.5% and Stage R (REM sleep) was 12.2%.    RESPIRATORY ANALYSIS:  There was a total of 23 respiratory events: 1 obstructive apneas, 0 central apneas and 0 mixed apneas with a total of 1 apneas and an apnea index (AI) of .2 /hour. There were 22 hypopneas with a hypopnea index of 4.5/hour. The patient also had 0 respiratory event related arousals (RERAs).   The total APNEA/HYPOPNEA INDEX  (AHI) was 4.7 /hour and the total RESPIRATORY DISTURBANCE INDEX was 4.7 .hour  0 events occurred in REM sleep and 23 events in NREM.  The REM AHI was 0 /hour versus a non-REM AHI of 5.3 /hour.  The patient spent 96.5 minutes of total sleep time in the supine position and 200 minutes in non-supine.  The supine AHI was 3.7, versus a non-supine AHI of 5.1.  OXYGEN SATURATION & C02:  The baseline 02 saturation was 97%, with the lowest being 83%. Time spent below 89% saturation equaled 9 minutes. CO2 was not measured.  PERIODIC LIMB MOVEMENTS:    The patient had a total of 554 Periodic Limb Movements. The Periodic Limb Movement (PLM) index was 112.3 and the PLM Arousal index was 8.7 /hour. The arousals were noted as: 17 were spontaneous, 43 were associated with PLMs, and 20 were associated with respiratory events. Audio and video analysis did not show any abnormal or unusual movements, behaviors, phonations or vocalizations.  The patient took 5 bathroom breaks. Snoring was noted. EKG was irregular.   DIAGNOSIS 1. Obstructive Sleep Apnea failed to respond to CPAP, but patient tolerated ASV.  2. Periodic Limb Movement Disorder  persisted, 8.7 arousals per hour (severe).  3. Reduced sleep efficiency, secondary to Nocturia and PLMs.   PLANS/RECOMMENDATIONS: ASV with settings of : EPAP 8 cm water, max PS of 15 cm water, minimum at 3 cm water, on FFM Airtouch F 20 in large size.  Patient slept 157 minutes with nadir at 84% SpO2, AHI of 2.3 .   DISCUSSION: 1)Follow up after 90 days of ASV use.  2)The PLMs need to be treated separately if these do not respond to PAP therapy. Dopaminergic agonists can be used in treatment, iron studies will be ordered.   A follow up appointment will be scheduled in the Sleep Clinic at [facility].   Please call [phone] with any questions.      I certify that I have reviewed the entire raw data recording prior to the issuance of this report in accordance with the Standards of Accreditation of the American Academy of Sleep Medicine (AASM)    Larey Seat, M.D.  02-21-2017  Diplomat, American Board of Psychiatry and Neurology  Diplomat, [place] of Sleep Medicine Medical Director, [place] [facility] at [facility]

## 2017-02-22 NOTE — Telephone Encounter (Signed)
I called pt, spoke to pt's wife, Mardene Celeste, per DPR. I advised her that Dr. Brett Fairy reviewed pt's sleep study and recommended that pt start an ASV at home. Pt's wife is agreeable to this. Pt's wife also noticed reduced sleep efficiency which was related to pt's nocturia and PLMs. I advised pt's wife that I will send the order for pt's ASV to [facility], and they will call pt within about a week to get it set up. I reviewed ASV compliance expectations with the pt's wife. A follow up appt was made for 06/22/2017 at 8:30am. Pt's wife verbalized understanding of results. Pt's wife had no questions at this time but was encouraged to call back if questions arise.

## 2017-03-24 ENCOUNTER — Ambulatory Visit (INDEPENDENT_AMBULATORY_CARE_PROVIDER_SITE_OTHER): Payer: Medicare Other | Admitting: Podiatry

## 2017-03-24 ENCOUNTER — Encounter: Payer: Self-pay | Admitting: Podiatry

## 2017-03-24 DIAGNOSIS — B351 Tinea unguium: Secondary | ICD-10-CM

## 2017-03-24 DIAGNOSIS — E119 Type 2 diabetes mellitus without complications: Secondary | ICD-10-CM

## 2017-03-24 DIAGNOSIS — M79676 Pain in unspecified toe(s): Secondary | ICD-10-CM | POA: Diagnosis not present

## 2017-03-24 NOTE — Progress Notes (Signed)
Patient ID: SHAMON COTHRAN, male   DOB: Dec 22, 1933, 83 y.o.   MRN: [id number] Complaint:  Visit Type: Patient returns to my office for continued preventative foot care services. Complaint: Patient states" my nails have grown long and thick and become painful to walk and wear shoes" Patient has been diagnosed with DM with no foot complications. The patient presents for preventative foot care services. No changes to ROS  Podiatric Exam: Vascular: dorsalis pedis and posterior tibial pulses are palpable bilateral. Capillary return is immediate. Temperature gradient is WNL. Skin turgor WNL  Sensorium: Normal Semmes Weinstein monofilament test. Normal tactile sensation bilaterally. Nail Exam: Pt has thick disfigured discolored nails with subungual debris noted bilateral entire nail hallux through fifth toenails Ulcer Exam: There is no evidence of ulcer or pre-ulcerative changes or infection. Orthopedic Exam: Muscle tone and strength are WNL. No limitations in general ROM. No crepitus or effusions noted. Foot type and digits show no abnormalities. Bony prominences are unremarkable. Skin: No Porokeratosis. No infection or ulcers  Diagnosis:  Onychomycosis, , Pain in right toe, pain in left toes  Treatment & Plan Procedures and Treatment: Consent by patient was obtained for treatment procedures. The patient understood the discussion of treatment and procedures well. All questions were answered thoroughly reviewed. Debridement of mycotic and hypertrophic toenails, 1 through 5 bilateral and clearing of subungual debris. No ulceration, no infection noted.  Return Visit-Office Procedure: Patient instructed to return to the office for a follow up visit 3 months for continued evaluation and treatment.    Gardiner Barefoot DPM

## 2017-04-06 DIAGNOSIS — R319 Hematuria, unspecified: Secondary | ICD-10-CM | POA: Diagnosis not present

## 2017-04-06 DIAGNOSIS — R946 Abnormal results of thyroid function studies: Secondary | ICD-10-CM | POA: Diagnosis not present

## 2017-04-06 DIAGNOSIS — R351 Nocturia: Secondary | ICD-10-CM | POA: Diagnosis not present

## 2017-04-06 DIAGNOSIS — R350 Frequency of micturition: Secondary | ICD-10-CM | POA: Diagnosis not present

## 2017-04-06 DIAGNOSIS — Z8546 Personal history of malignant neoplasm of prostate: Secondary | ICD-10-CM | POA: Diagnosis not present

## 2017-04-06 DIAGNOSIS — N390 Urinary tract infection, site not specified: Secondary | ICD-10-CM | POA: Diagnosis not present

## 2017-04-10 DIAGNOSIS — N179 Acute kidney failure, unspecified: Secondary | ICD-10-CM | POA: Diagnosis not present

## 2017-04-10 DIAGNOSIS — Z794 Long term (current) use of insulin: Secondary | ICD-10-CM | POA: Diagnosis not present

## 2017-04-10 DIAGNOSIS — E118 Type 2 diabetes mellitus with unspecified complications: Secondary | ICD-10-CM | POA: Diagnosis not present

## 2017-04-10 DIAGNOSIS — R5383 Other fatigue: Secondary | ICD-10-CM | POA: Diagnosis not present

## 2017-04-11 ENCOUNTER — Other Ambulatory Visit: Payer: Self-pay | Admitting: Internal Medicine

## 2017-04-11 DIAGNOSIS — R634 Abnormal weight loss: Secondary | ICD-10-CM

## 2017-04-14 ENCOUNTER — Ambulatory Visit
Admission: RE | Admit: 2017-04-14 | Discharge: 2017-04-14 | Disposition: A | Discharge status: Home / Self Care | Payer: Medicare Other | Source: Ambulatory Visit | Attending: Internal Medicine | Admitting: Internal Medicine

## 2017-04-14 ENCOUNTER — Ambulatory Visit
Admission: RE | Admit: 2017-04-14 | Discharge: 2017-04-14 | Disposition: A | Discharge status: Home / Self Care | Payer: PRIVATE HEALTH INSURANCE | Source: Ambulatory Visit | Attending: Internal Medicine | Admitting: Internal Medicine

## 2017-04-14 DIAGNOSIS — N132 Hydronephrosis with renal and ureteral calculous obstruction: Secondary | ICD-10-CM | POA: Diagnosis not present

## 2017-04-14 DIAGNOSIS — R634 Abnormal weight loss: Secondary | ICD-10-CM

## 2017-04-14 DIAGNOSIS — R918 Other nonspecific abnormal finding of lung field: Secondary | ICD-10-CM | POA: Diagnosis not present

## 2017-04-20 DIAGNOSIS — N390 Urinary tract infection, site not specified: Secondary | ICD-10-CM | POA: Diagnosis not present

## 2017-04-20 DIAGNOSIS — N179 Acute kidney failure, unspecified: Secondary | ICD-10-CM | POA: Diagnosis not present

## 2017-04-20 DIAGNOSIS — R5383 Other fatigue: Secondary | ICD-10-CM | POA: Diagnosis not present

## 2017-04-20 DIAGNOSIS — R319 Hematuria, unspecified: Secondary | ICD-10-CM | POA: Diagnosis not present

## 2017-04-26 DIAGNOSIS — N1330 Unspecified hydronephrosis: Secondary | ICD-10-CM | POA: Diagnosis not present

## 2017-04-26 DIAGNOSIS — D890 Polyclonal hypergammaglobulinemia: Secondary | ICD-10-CM | POA: Diagnosis not present

## 2017-04-26 DIAGNOSIS — N289 Disorder of kidney and ureter, unspecified: Secondary | ICD-10-CM | POA: Diagnosis not present

## 2017-04-26 DIAGNOSIS — R350 Frequency of micturition: Secondary | ICD-10-CM | POA: Diagnosis not present

## 2017-05-06 ENCOUNTER — Inpatient Hospital Stay (HOSPITAL_COMMUNITY): Payer: Medicare Other

## 2017-05-06 ENCOUNTER — Emergency Department (HOSPITAL_COMMUNITY): Payer: Medicare Other

## 2017-05-06 ENCOUNTER — Inpatient Hospital Stay (HOSPITAL_COMMUNITY)
Admission: EM | Admit: 2017-05-06 | Discharge: 2017-05-13 | DRG: 682 | Disposition: A | Discharge status: Home Health | Payer: Medicare Other | Attending: Internal Medicine | Admitting: Internal Medicine

## 2017-05-06 ENCOUNTER — Encounter (HOSPITAL_COMMUNITY): Payer: Self-pay | Admitting: Emergency Medicine

## 2017-05-06 DIAGNOSIS — R627 Adult failure to thrive: Secondary | ICD-10-CM | POA: Diagnosis present

## 2017-05-06 DIAGNOSIS — IMO0001 Reserved for inherently not codable concepts without codable children: Secondary | ICD-10-CM

## 2017-05-06 DIAGNOSIS — E875 Hyperkalemia: Secondary | ICD-10-CM | POA: Diagnosis present

## 2017-05-06 DIAGNOSIS — Z419 Encounter for procedure for purposes other than remedying health state, unspecified: Secondary | ICD-10-CM

## 2017-05-06 DIAGNOSIS — Z8673 Personal history of transient ischemic attack (TIA), and cerebral infarction without residual deficits: Secondary | ICD-10-CM

## 2017-05-06 DIAGNOSIS — E119 Type 2 diabetes mellitus without complications: Secondary | ICD-10-CM | POA: Diagnosis not present

## 2017-05-06 DIAGNOSIS — Z23 Encounter for immunization: Secondary | ICD-10-CM | POA: Diagnosis not present

## 2017-05-06 DIAGNOSIS — G4700 Insomnia, unspecified: Secondary | ICD-10-CM | POA: Diagnosis present

## 2017-05-06 DIAGNOSIS — W19XXXA Unspecified fall, initial encounter: Secondary | ICD-10-CM | POA: Diagnosis present

## 2017-05-06 DIAGNOSIS — F05 Delirium due to known physiological condition: Secondary | ICD-10-CM | POA: Diagnosis present

## 2017-05-06 DIAGNOSIS — N189 Chronic kidney disease, unspecified: Secondary | ICD-10-CM | POA: Diagnosis present

## 2017-05-06 DIAGNOSIS — I4891 Unspecified atrial fibrillation: Secondary | ICD-10-CM | POA: Diagnosis not present

## 2017-05-06 DIAGNOSIS — R7309 Other abnormal glucose: Secondary | ICD-10-CM | POA: Diagnosis not present

## 2017-05-06 DIAGNOSIS — B957 Other staphylococcus as the cause of diseases classified elsewhere: Secondary | ICD-10-CM | POA: Diagnosis present

## 2017-05-06 DIAGNOSIS — Z7984 Long term (current) use of oral hypoglycemic drugs: Secondary | ICD-10-CM

## 2017-05-06 DIAGNOSIS — Z7901 Long term (current) use of anticoagulants: Secondary | ICD-10-CM

## 2017-05-06 DIAGNOSIS — Z96653 Presence of artificial knee joint, bilateral: Secondary | ICD-10-CM | POA: Diagnosis present

## 2017-05-06 DIAGNOSIS — G4733 Obstructive sleep apnea (adult) (pediatric): Secondary | ICD-10-CM | POA: Diagnosis present

## 2017-05-06 DIAGNOSIS — N390 Urinary tract infection, site not specified: Secondary | ICD-10-CM | POA: Diagnosis not present

## 2017-05-06 DIAGNOSIS — Z923 Personal history of irradiation: Secondary | ICD-10-CM

## 2017-05-06 DIAGNOSIS — Z466 Encounter for fitting and adjustment of urinary device: Secondary | ICD-10-CM | POA: Diagnosis not present

## 2017-05-06 DIAGNOSIS — S0181XA Laceration without foreign body of other part of head, initial encounter: Secondary | ICD-10-CM | POA: Diagnosis present

## 2017-05-06 DIAGNOSIS — R634 Abnormal weight loss: Secondary | ICD-10-CM | POA: Diagnosis present

## 2017-05-06 DIAGNOSIS — M1990 Unspecified osteoarthritis, unspecified site: Secondary | ICD-10-CM | POA: Diagnosis present

## 2017-05-06 DIAGNOSIS — Z87891 Personal history of nicotine dependence: Secondary | ICD-10-CM | POA: Diagnosis not present

## 2017-05-06 DIAGNOSIS — N201 Calculus of ureter: Secondary | ICD-10-CM | POA: Diagnosis not present

## 2017-05-06 DIAGNOSIS — R310 Gross hematuria: Secondary | ICD-10-CM | POA: Diagnosis present

## 2017-05-06 DIAGNOSIS — Z888 Allergy status to other drugs, medicaments and biological substances status: Secondary | ICD-10-CM

## 2017-05-06 DIAGNOSIS — D631 Anemia in chronic kidney disease: Secondary | ICD-10-CM | POA: Diagnosis not present

## 2017-05-06 DIAGNOSIS — Z8546 Personal history of malignant neoplasm of prostate: Secondary | ICD-10-CM

## 2017-05-06 DIAGNOSIS — E870 Hyperosmolality and hypernatremia: Secondary | ICD-10-CM | POA: Diagnosis not present

## 2017-05-06 DIAGNOSIS — D649 Anemia, unspecified: Secondary | ICD-10-CM | POA: Diagnosis not present

## 2017-05-06 DIAGNOSIS — Z96652 Presence of left artificial knee joint: Secondary | ICD-10-CM | POA: Diagnosis present

## 2017-05-06 DIAGNOSIS — N131 Hydronephrosis with ureteral stricture, not elsewhere classified: Secondary | ICD-10-CM | POA: Diagnosis not present

## 2017-05-06 DIAGNOSIS — I440 Atrioventricular block, first degree: Secondary | ICD-10-CM | POA: Diagnosis present

## 2017-05-06 DIAGNOSIS — G4730 Sleep apnea, unspecified: Secondary | ICD-10-CM

## 2017-05-06 DIAGNOSIS — E785 Hyperlipidemia, unspecified: Secondary | ICD-10-CM | POA: Diagnosis present

## 2017-05-06 DIAGNOSIS — E8809 Other disorders of plasma-protein metabolism, not elsewhere classified: Secondary | ICD-10-CM | POA: Diagnosis present

## 2017-05-06 DIAGNOSIS — E1122 Type 2 diabetes mellitus with diabetic chronic kidney disease: Secondary | ICD-10-CM | POA: Diagnosis present

## 2017-05-06 DIAGNOSIS — D62 Acute posthemorrhagic anemia: Secondary | ICD-10-CM | POA: Diagnosis not present

## 2017-05-06 DIAGNOSIS — D7289 Other specified disorders of white blood cells: Secondary | ICD-10-CM | POA: Diagnosis not present

## 2017-05-06 DIAGNOSIS — R195 Other fecal abnormalities: Secondary | ICD-10-CM | POA: Diagnosis not present

## 2017-05-06 DIAGNOSIS — D72829 Elevated white blood cell count, unspecified: Secondary | ICD-10-CM | POA: Diagnosis not present

## 2017-05-06 DIAGNOSIS — E43 Unspecified severe protein-calorie malnutrition: Secondary | ICD-10-CM | POA: Insufficient documentation

## 2017-05-06 DIAGNOSIS — I129 Hypertensive chronic kidney disease with stage 1 through stage 4 chronic kidney disease, or unspecified chronic kidney disease: Secondary | ICD-10-CM | POA: Diagnosis present

## 2017-05-06 DIAGNOSIS — C61 Malignant neoplasm of prostate: Secondary | ICD-10-CM | POA: Diagnosis not present

## 2017-05-06 DIAGNOSIS — N179 Acute kidney failure, unspecified: Secondary | ICD-10-CM | POA: Diagnosis not present

## 2017-05-06 DIAGNOSIS — G479 Sleep disorder, unspecified: Secondary | ICD-10-CM | POA: Diagnosis not present

## 2017-05-06 DIAGNOSIS — I10 Essential (primary) hypertension: Secondary | ICD-10-CM | POA: Diagnosis not present

## 2017-05-06 DIAGNOSIS — T8383XA Hemorrhage of genitourinary prosthetic devices, implants and grafts, initial encounter: Secondary | ICD-10-CM | POA: Diagnosis not present

## 2017-05-06 DIAGNOSIS — Z79899 Other long term (current) drug therapy: Secondary | ICD-10-CM | POA: Diagnosis not present

## 2017-05-06 DIAGNOSIS — N1330 Unspecified hydronephrosis: Secondary | ICD-10-CM

## 2017-05-06 DIAGNOSIS — N132 Hydronephrosis with renal and ureteral calculous obstruction: Secondary | ICD-10-CM | POA: Diagnosis present

## 2017-05-06 DIAGNOSIS — I4892 Unspecified atrial flutter: Secondary | ICD-10-CM | POA: Diagnosis present

## 2017-05-06 DIAGNOSIS — Z9109 Other allergy status, other than to drugs and biological substances: Secondary | ICD-10-CM | POA: Diagnosis not present

## 2017-05-06 DIAGNOSIS — Z794 Long term (current) use of insulin: Secondary | ICD-10-CM

## 2017-05-06 DIAGNOSIS — N182 Chronic kidney disease, stage 2 (mild): Secondary | ICD-10-CM | POA: Diagnosis not present

## 2017-05-06 DIAGNOSIS — N2581 Secondary hyperparathyroidism of renal origin: Secondary | ICD-10-CM | POA: Diagnosis not present

## 2017-05-06 DIAGNOSIS — F411 Generalized anxiety disorder: Secondary | ICD-10-CM | POA: Diagnosis not present

## 2017-05-06 DIAGNOSIS — N183 Chronic kidney disease, stage 3 (moderate): Secondary | ICD-10-CM | POA: Diagnosis not present

## 2017-05-06 DIAGNOSIS — H9190 Unspecified hearing loss, unspecified ear: Secondary | ICD-10-CM | POA: Diagnosis present

## 2017-05-06 DIAGNOSIS — E7800 Pure hypercholesterolemia, unspecified: Secondary | ICD-10-CM | POA: Diagnosis present

## 2017-05-06 DIAGNOSIS — R5381 Other malaise: Secondary | ICD-10-CM | POA: Diagnosis not present

## 2017-05-06 DIAGNOSIS — N200 Calculus of kidney: Secondary | ICD-10-CM

## 2017-05-06 DIAGNOSIS — Z4682 Encounter for fitting and adjustment of non-vascular catheter: Secondary | ICD-10-CM | POA: Diagnosis not present

## 2017-05-06 LAB — URINALYSIS, ROUTINE W REFLEX MICROSCOPIC
Bilirubin Urine: NEGATIVE
GLUCOSE, UA: NEGATIVE mg/dL
KETONES UR: NEGATIVE mg/dL
Nitrite: NEGATIVE
Protein, ur: NEGATIVE mg/dL
SPECIFIC GRAVITY, URINE: 1.012 (ref 1.005–1.030)
pH: 5 (ref 5.0–8.0)

## 2017-05-06 LAB — CBC
HCT: 40.3 % (ref 39.0–52.0)
Hemoglobin: 13.7 g/dL (ref 13.0–17.0)
MCH: 30.6 pg (ref 26.0–34.0)
MCHC: 34 g/dL (ref 30.0–36.0)
MCV: 90 fL (ref 78.0–100.0)
PLATELETS: 243 10*3/uL (ref 150–400)
RBC: 4.48 MIL/uL (ref 4.22–5.81)
RDW: 14.5 % (ref 11.5–15.5)
WBC: 15.6 10*3/uL — AB (ref 4.0–10.5)

## 2017-05-06 LAB — CBG MONITORING, ED: Glucose-Capillary: 119 mg/dL — ABNORMAL HIGH (ref 65–99)

## 2017-05-06 LAB — GLUCOSE, CAPILLARY: GLUCOSE-CAPILLARY: 107 mg/dL — AB (ref 65–99)

## 2017-05-06 LAB — BASIC METABOLIC PANEL
Anion gap: 11 (ref 5–15)
BUN: 89 mg/dL — AB (ref 6–20)
CALCIUM: 9.1 mg/dL (ref 8.9–10.3)
CO2: 19 mmol/L — ABNORMAL LOW (ref 22–32)
CREATININE: 5.29 mg/dL — AB (ref 0.61–1.24)
Chloride: 103 mmol/L (ref 101–111)
GFR calc Af Amer: 11 mL/min — ABNORMAL LOW (ref >60)
GFR calc non Af Amer: 9 mL/min — ABNORMAL LOW (ref >60)
Glucose, Bld: 92 mg/dL (ref 65–99)
Potassium: 5.6 mmol/L — ABNORMAL HIGH (ref 3.5–5.1)
Sodium: 133 mmol/L — ABNORMAL LOW (ref 135–145)

## 2017-05-06 LAB — POC OCCULT BLOOD, ED: Fecal Occult Bld: POSITIVE — AB

## 2017-05-06 LAB — PROTEIN / CREATININE RATIO, URINE
Creatinine, Urine: 87.87 mg/dL
PROTEIN CREATININE RATIO: 0.25 mg/mg{creat} — AB (ref 0.00–0.15)
TOTAL PROTEIN, URINE: 22 mg/dL

## 2017-05-06 LAB — SODIUM, URINE, RANDOM: SODIUM UR: 15 mmol/L

## 2017-05-06 LAB — TSH: TSH: 4.424 u[IU]/mL (ref 0.350–4.500)

## 2017-05-06 MED ORDER — HEPARIN (PORCINE) IN NACL 100-0.45 UNIT/ML-% IJ SOLN
1150.0000 [IU]/h | INTRAMUSCULAR | Status: DC
  Administered 2017-05-06: 1300 [IU]/h via INTRAVENOUS
  Filled 2017-05-06 (×3): qty 250

## 2017-05-06 MED ORDER — SODIUM CHLORIDE 0.9 % IV SOLN
Freq: Once | INTRAVENOUS | Status: AC
  Administered 2017-05-06: 22:00:00 via INTRAVENOUS

## 2017-05-06 MED ORDER — TETANUS-DIPHTH-ACELL PERTUSSIS 5-2.5-18.5 LF-MCG/0.5 IM SUSP
0.5000 mL | Freq: Once | INTRAMUSCULAR | Status: AC
  Administered 2017-05-06: 0.5 mL via INTRAMUSCULAR
  Filled 2017-05-06: qty 0.5

## 2017-05-06 MED ORDER — PRAVASTATIN SODIUM 40 MG PO TABS
40.0000 mg | ORAL_TABLET | Freq: Every day | ORAL | Status: DC
Start: 2017-05-07 — End: 2017-05-13
  Administered 2017-05-07 – 2017-05-12 (×6): 40 mg via ORAL
  Filled 2017-05-06 (×6): qty 1

## 2017-05-06 MED ORDER — SODIUM POLYSTYRENE SULFONATE 15 GM/60ML PO SUSP
15.0000 g | Freq: Once | ORAL | Status: AC
  Administered 2017-05-06: 15 g via ORAL
  Filled 2017-05-06: qty 60

## 2017-05-06 MED ORDER — SODIUM CHLORIDE 0.9% FLUSH
3.0000 mL | Freq: Two times a day (BID) | INTRAVENOUS | Status: DC
  Administered 2017-05-07 – 2017-05-13 (×6): 3 mL via INTRAVENOUS

## 2017-05-06 MED ORDER — SODIUM CHLORIDE 0.9 % IV SOLN
Freq: Once | INTRAVENOUS | Status: AC
  Administered 2017-05-06: 17:00:00 via INTRAVENOUS

## 2017-05-06 MED ORDER — DEXTROSE 5 % IV SOLN
1.0000 g | INTRAVENOUS | Status: AC
  Administered 2017-05-06 – 2017-05-07 (×3): 1 g via INTRAVENOUS
  Filled 2017-05-06 (×3): qty 10

## 2017-05-06 MED ORDER — INSULIN ASPART 100 UNIT/ML ~~LOC~~ SOLN
0.0000 [IU] | Freq: Three times a day (TID) | SUBCUTANEOUS | Status: DC
  Administered 2017-05-07 – 2017-05-08 (×4): 2 [IU] via SUBCUTANEOUS
  Administered 2017-05-09: 1 [IU] via SUBCUTANEOUS
  Administered 2017-05-10 (×2): 2 [IU] via SUBCUTANEOUS
  Administered 2017-05-10: 1 [IU] via SUBCUTANEOUS
  Administered 2017-05-11: 3 [IU] via SUBCUTANEOUS
  Administered 2017-05-11 – 2017-05-12 (×4): 2 [IU] via SUBCUTANEOUS
  Administered 2017-05-12 – 2017-05-13 (×2): 3 [IU] via SUBCUTANEOUS
  Administered 2017-05-13: 2 [IU] via SUBCUTANEOUS

## 2017-05-06 NOTE — ED Notes (Signed)
Pt provided with urinal to provide sample.

## 2017-05-06 NOTE — Consult Note (Signed)
Consult: bilateral hydronephrosis, AKI Requested by: Dr. Bonner Puna   History of Present Illness:  83 yo with 1-2 weeks of generalized weakness, urinary frequency and nocturia (even more than baseline). Labs 04/06/17 showed SCr 2.1 (up from baseline ~ 1.0) and BUN 43. CT was done 5/18 which revealed left hydronephrosis with dilation of the ureter down to the pelvic brim or the iliac and then the ureter appeared to taper to a normal caliber. There may been some subtle right hydronephrosis. Ultrasound today revealed continued moderate left hydronephrosis (this appeared to be the collecting system and not parapelvic cysts), mild right hydronephrosis and a decompressed bladder. His Labs on admission: Wbc 15.6 Bun 89 Cr 5.29 UA rare bac, tntc wbc, tntc rbc  A BMP was rechecked this evening with a BUN of 90 and a creatinine of 5. He has no dysuria. Clear urine in bag (has a condom cath). On rocephin.   Past GU hx: Intermediate risk prostate cancer treated with IMRT completed Sept 2012 for a Gleason 3+4, PSA 6.59, 41 gram prostate. His PSA < 0.06 May 2016. H/o frequency, nocturia and nl PVR. Tried tamsulosin which worsened frequency. He had gross hematuria evaluated in 2016. He smoked >1 pack a day for 20 years and quit smoking 35 years ago. CT scan for microhematuria revealed cholelithiasis, left lower pole renal calculi measuring 4 and 6 mm (adjacent), left parapelvic cyst, left inguinal hernia. Cystoscopy showed no bladder stone or tumor.     Past Medical History:  Diagnosis Date  . Arthritis   . Diabetes mellitus without complication ([place])   . Hypercholesteremia   . Hypertension   . Prostate cancer [facility])    with radiation treatment  . Sleep apnea 2/14   "mild per patient" hasnt gotten CPAP machine  . Stroke [facility])    Past Surgical History:  Procedure Laterality Date  . ADENOIDECTOMY    . FLEXIBLE SIGMOIDOSCOPY N/A 09/12/2013   Procedure: FLEXIBLE SIGMOIDOSCOPY;  Surgeon: Milus Banister, MD;   Location: [facility] ENDOSCOPY;  Service: Endoscopy;  Laterality: N/A;  . HERNIA REPAIR    . HOT HEMOSTASIS N/A 09/12/2013   Procedure: HOT HEMOSTASIS (ARGON PLASMA COAGULATION/BICAP);  Surgeon: Milus Banister, MD;  Location: [facility];  Service: Endoscopy;  Laterality: N/A;  . LIPOMA EXCISION    . OTHER SURGICAL HISTORY     s/p prostate radiation  . TONSILLECTOMY    . TOTAL KNEE ARTHROPLASTY Left 03/25/2014   Procedure: LEFT TOTAL KNEE ARTHROPLASTY;  Surgeon: [name], MD;  Location: [facility] ORS;  Service: Orthopedics;  Laterality: Left;  . TOTAL KNEE ARTHROPLASTY Right 05/06/2014   Procedure: RIGHT TOTAL KNEE ARTHROPLASTY;  Surgeon: [name], MD;  Location: [facility] ORS;  Service: Orthopedics;  Laterality: Right;    Home Medications:  Prescriptions Prior to Admission  Medication Sig Dispense Refill Last Dose  . acetaminophen (TYLENOL) 325 MG tablet Take 325-650 mg by mouth every 6 (six) hours as needed for mild pain.   PRN at PRN  . amoxicillin (AMOXIL) 500 MG capsule Take 2,000 mg by mouth See admin instructions. ONE HOUR PRIOR TO DENTAL PROCEDURES   Past Week at Unknown time  . apixaban (ELIQUIS) 5 MG TABS tablet Take 1 tablet (5 mg total) by mouth 2 (two) times daily. 30 tablet 0 05/06/2017 at 0800  . Coenzyme Q10 (CO Q-10) 300 MG CAPS Take 300 mg by mouth daily with breakfast.   05/06/2017 at am  . Glucosamine-Chondroit-Vit C-Mn (GLUCOSAMINE-CHONDROITIN) TABS Take 1 tablet by mouth  2 (two) times daily.   05/06/2017 at am  . HUMALOG KWIKPEN 100 UNIT/ML KiwkPen Inject 6 Units into the skin 3 (three) times daily before meals.    05/06/2017 at am  . loperamide (IMODIUM A-D) 2 MG tablet Take 2 mg by mouth 4 (four) times daily as needed for diarrhea or loose stools.   PRN at PRN  . ONGLYZA 5 MG TABS tablet Take 5 mg by mouth every morning.    05/06/2017 at 0800  . pravastatin (PRAVACHOL) 40 MG tablet Take 40 mg by mouth daily with supper.    05/05/2017 at pm  . LEVEMIR FLEXTOUCH 100 UNIT/ML Pen Inject 16  Units into the skin at bedtime.    ON HOLD at ON HOLD  . lisinopril-hydrochlorothiazide (PRINZIDE,ZESTORETIC) 10-12.5 MG per tablet Take 1 tablet by mouth every morning.    ON HOLD at ON HOLD  . metFORMIN (GLUCOPHAGE) 1000 MG tablet Take 1,000 mg by mouth 2 (two) times daily with a meal.   ON HOLD at ON HOLD   Allergies:  Allergies  Allergen Reactions  . Glyburide Other (See Comments)    Hypoglycemia     Family History  Problem Relation Age of Onset  . Colon cancer Father   . Stroke Maternal Grandmother   . Esophageal cancer Neg Hx   . Rectal cancer Neg Hx   . Stomach cancer Neg Hx    Social History:  reports that he has quit smoking. He started smoking about 34 years ago. He has never used smokeless tobacco. He reports that he drinks about 0.6 oz of alcohol per week . He reports that he does not use drugs.  ROS: A complete review of systems was performed.  All systems are negative except for pertinent findings as noted. Review of Systems  All other systems reviewed and are negative.    Physical Exam:  Vital signs in last 24 hours: Temp:  [97.6 F (36.4 C)-97.7 F (36.5 C)] 97.7 F (36.5 C) (06/09 2014) Pulse Rate:  [68-79] 72 (06/09 2014) Resp:  [16-36] 21 (06/09 2014) BP: (114-160)/(63-90) 151/89 (06/09 2014) SpO2:  [98 %-100 %] 99 % (06/09 2014) Weight:  [93.2 kg (205 lb 7.5 oz)] 93.2 kg (205 lb 7.5 oz) (06/09 2014) General:  Alert and oriented, No acute distress HEENT: Normocephalic, atraumatic Neck: No JVD or lymphadenopathy Cardiovascular: Regular rate and rhythm Lungs: Regular rate and effort Abdomen: Soft, nontender, nondistended, no abdominal masses Back: No CVA tenderness Extremities: No edema Neurologic: Grossly intact  Laboratory Data:  Results for orders placed or performed during the hospital encounter of 05/06/17 (from the past 24 hour(s))  Basic metabolic panel     Status: Abnormal   Collection Time: 05/06/17 12:24 PM  Result Value Ref Range    Sodium 133 (L) 135 - 145 mmol/L   Potassium 5.6 (H) 3.5 - 5.1 mmol/L   Chloride 103 101 - 111 mmol/L   CO2 19 (L) 22 - 32 mmol/L   Glucose, Bld 92 65 - 99 mg/dL   BUN 89 (H) 6 - 20 mg/dL   Creatinine, Ser 5.29 (H) 0.61 - 1.24 mg/dL   Calcium 9.1 8.9 - 10.3 mg/dL   GFR calc non Af Amer 9 (L) >60 mL/min   GFR calc Af Amer 11 (L) >60 mL/min   Anion gap 11 5 - 15  CBC     Status: Abnormal   Collection Time: 05/06/17 12:24 PM  Result Value Ref Range   WBC 15.6 (H) 4.0 -  10.5 K/uL   RBC 4.48 4.22 - 5.81 MIL/uL   Hemoglobin 13.7 13.0 - 17.0 g/dL   HCT 40.3 39.0 - 52.0 %   MCV 90.0 78.0 - 100.0 fL   MCH 30.6 26.0 - 34.0 pg   MCHC 34.0 30.0 - 36.0 g/dL   RDW 14.5 11.5 - 15.5 %   Platelets 243 150 - 400 K/uL  CBG monitoring, ED     Status: Abnormal   Collection Time: 05/06/17  3:48 PM  Result Value Ref Range   Glucose-Capillary 119 (H) 65 - 99 mg/dL  Urinalysis, Routine w reflex microscopic     Status: Abnormal   Collection Time: 05/06/17  4:12 PM  Result Value Ref Range   Color, Urine YELLOW YELLOW   APPearance CLOUDY (A) CLEAR   Specific Gravity, Urine 1.012 1.005 - 1.030   pH 5.0 5.0 - 8.0   Glucose, UA NEGATIVE NEGATIVE mg/dL   Hgb urine dipstick LARGE (A) NEGATIVE   Bilirubin Urine NEGATIVE NEGATIVE   Ketones, ur NEGATIVE NEGATIVE mg/dL   Protein, ur NEGATIVE NEGATIVE mg/dL   Nitrite NEGATIVE NEGATIVE   Leukocytes, UA MODERATE (A) NEGATIVE   RBC / HPF TOO NUMEROUS TO COUNT 0 - 5 RBC/hpf   WBC, UA TOO NUMEROUS TO COUNT 0 - 5 WBC/hpf   Bacteria, UA RARE (A) NONE SEEN   Squamous Epithelial / LPF 0-5 (A) NONE SEEN   Mucous PRESENT   Sodium, urine, random     Status: None   Collection Time: 05/06/17  4:12 PM  Result Value Ref Range   Sodium, Ur 15 mmol/L  Protein / creatinine ratio, urine     Status: Abnormal   Collection Time: 05/06/17  4:12 PM  Result Value Ref Range   Creatinine, Urine 87.87 mg/dL   Total Protein, Urine 22 mg/dL   Protein Creatinine Ratio 0.25 (H) 0.00  - 0.15 mg/mg[Cre]  POC occult blood, ED Provider will collect     Status: Abnormal   Collection Time: 05/06/17  4:22 PM  Result Value Ref Range   Fecal Occult Bld POSITIVE (A) NEGATIVE  Glucose, capillary     Status: Abnormal   Collection Time: 05/06/17  8:13 PM  Result Value Ref Range   Glucose-Capillary 107 (H) 65 - 99 mg/dL   No results found for this or any previous visit (from the past 240 hour(s)). Creatinine:  Recent Labs  05/06/17 1224  CREATININE 5.29*    Impression/Assessment/plan: AKI - progressive kidney failure with L > R hydronephrosis  - I discussed with the patient and his daughter the CT and US findings and the nature, potential benefits, risks and alternatives to cystoscopy, RGP's/bilateral ureteral stent placement, including side effects of the proposed treatment, the likelihood of the patient achieving the goals of the procedure, and any potential problems that might occur during the procedure or recuperation. We also discussed continued surveillance and PCNx's. They elected to proceed with cysto/stents. We did discuss risks such as bleeding, infection, CV risks, stent pain, worsening LUTS. Also, discussed possible failure to find UO and/or gain RG access. All questions answered. Patient elects to proceed. He is stable and looks well. I don;t feel it needs to be done urgently tonight.    Nour Rodrigues 05/06/2017, 8:51 PM

## 2017-05-06 NOTE — Progress Notes (Signed)
ANTICOAGULATION CONSULT NOTE - Initial Consult  Pharmacy Consult for heparin Indication: atrial fibrillation  Allergies  Allergen Reactions  . Glyburide Other (See Comments)    Hypoglycemia    Patient Measurements: Height: 6\' 1"  (185.4 cm) Weight: 205 lb 7.5 oz (93.2 kg) IBW/kg (Calculated) : 79.9 Heparin Dosing Weight: 93 Kg  Vital Signs: Temp: 97.7 F (36.5 C) (06/09 2014) Temp Source: Oral (06/09 2014) BP: 151/89 (06/09 2014) Pulse Rate: 72 (06/09 2014)  Labs:  Recent Labs  05/06/17 1224  HGB 13.7  HCT 40.3  PLT 243  CREATININE 5.29*   Estimated Creatinine Clearance: 12.6 mL/min (A) (by C-G formula based on SCr of 5.29 mg/dL (H)).  Medical History: Past Medical History:  Diagnosis Date  . Arthritis   . Diabetes mellitus without complication ([place])   . Hypercholesteremia   . Hypertension   . Prostate cancer [facility])    with radiation treatment  . Sleep apnea 2/14   "mild per patient" hasnt gotten CPAP machine  . Stroke [facility])    Medications:  Prescriptions Prior to Admission  Medication Sig Dispense Refill Last Dose  . acetaminophen (TYLENOL) 325 MG tablet Take 325-650 mg by mouth every 6 (six) hours as needed for mild pain.   PRN at PRN  . amoxicillin (AMOXIL) 500 MG capsule Take 2,000 mg by mouth See admin instructions. ONE HOUR PRIOR TO DENTAL PROCEDURES   Past Week at Unknown time  . apixaban (ELIQUIS) 5 MG TABS tablet Take 1 tablet (5 mg total) by mouth 2 (two) times daily. 30 tablet 0 05/06/2017 at 0800  . Coenzyme Q10 (CO Q-10) 300 MG CAPS Take 300 mg by mouth daily with breakfast.   05/06/2017 at am  . Glucosamine-Chondroit-Vit C-Mn (GLUCOSAMINE-CHONDROITIN) TABS Take 1 tablet by mouth 2 (two) times daily.   05/06/2017 at am  . HUMALOG KWIKPEN 100 UNIT/ML KiwkPen Inject 6 Units into the skin 3 (three) times daily before meals.    05/06/2017 at am  . loperamide (IMODIUM A-D) 2 MG tablet Take 2 mg by mouth 4 (four) times daily as needed for diarrhea or loose  stools.   PRN at PRN  . ONGLYZA 5 MG TABS tablet Take 5 mg by mouth every morning.    05/06/2017 at 0800  . pravastatin (PRAVACHOL) 40 MG tablet Take 40 mg by mouth daily with supper.    05/05/2017 at pm  . LEVEMIR FLEXTOUCH 100 UNIT/ML Pen Inject 16 Units into the skin at bedtime.    ON HOLD at ON HOLD  . lisinopril-hydrochlorothiazide (PRINZIDE,ZESTORETIC) 10-12.5 MG per tablet Take 1 tablet by mouth every morning.    ON HOLD at ON HOLD  . metFORMIN (GLUCOPHAGE) 1000 MG tablet Take 1,000 mg by mouth 2 (two) times daily with a meal.   ON HOLD at ON HOLD    Assessment: William [name] is a 83 y.o. male with a history of AFib on eliquis PTA admitted with generalized weakness leading to recurrent falls. Last dose PTA 05/06/17 at 0800. Baseline CBC WNL, however FOBT is positive. MD aware of lab results and would like to start heparin instead of Eliquis as patient has a significant AKI. No work-up for GI bleed ordered at this time. Will avoid bolus in this patient and initiate heparin infusion conservatively.   Goal of Therapy:  Heparin level 0.3-0.7 units/ml Monitor platelets by anticoagulation protocol: Yes   Plan:  Start heparin infusion at 1300 units/hr Check anti-Xa level in 8 hours and daily while on heparin Continue  to monitor H&H and platelets Monitor for s/sx of bleeding  Georga Bora, PharmD Clinical Pharmacist 05/06/2017 9:02 PM

## 2017-05-06 NOTE — ED Provider Notes (Signed)
[facility] DEPT Provider Note   CSN: [id number] Arrival date & time: 05/06/17  1217     History   Chief Complaint Chief Complaint  Patient presents with  . Dizziness  . Weakness    HPI William [name] is a 83 y.o. male with history of diabetes, HTN, HLD, prostate cancer, and stroke in November 2017 with chief complaint gradual onset, progressively worsening weakness, weight loss, SOB and DOE, and painless hematuria. He is accompanied by his wife and his daughter, who state that for the past several weeks he has been increasingly fatigued, with decreased appetite. He developed urinary frequency 2 weeks ago, with patient's wife stating that he used the restroom every 10-15 minutes nightly. Most recently, he developed increased generalized weakness which resulted in 2 falls yesterday and the day before. The first fall happened Thursday morning, in which the patient states that his legs gave out, and he landed backwards on his relative. He denies hitting his head or loss of consciousness at that time. He remained strength shortly thereafter and was ambulatory. Yesterday, he fell forward off of a toilet, obtaining a laceration to his forehead. Tetanus not UTD. Bleeding was controlled at that time, and he denies loss of consciousness. He is on eliquis secondary to A. fib which was discovered in November 2017 when patient was found to have an embolic stroke involving the pre-cerebral artery. He has had 40 pound weight loss since the stroke in November. He does not have any residual weakness from the stroke. His primary care has been managing the symptoms, and he is scheduled for an upcoming echo, follow-up with urology (including cystoscopy for workup of painless hematuria), and hematology due to an M-spike noted on his lab work. Workup has also showed acute renal insufficiency.   The history is provided by the patient, a relative and the spouse.    Past Medical History:  Diagnosis Date  .  Arthritis   . Diabetes mellitus without complication ([place])   . Hypercholesteremia   . Hypertension   . Prostate cancer [facility])    with radiation treatment  . Sleep apnea 2/14   "mild per patient" hasnt gotten CPAP machine  . Stroke [facility])     Patient Active Problem List   Diagnosis Date Noted  . Stroke ([place]) 10/26/2016  . Obese 05/07/2014  . Expected blood loss anemia 05/07/2014  . S/P right TKA 05/06/2014  . DJD (degenerative joint disease) of knee 03/25/2014  . Preop cardiovascular exam 03/19/2014  . Abnormal ECG 03/19/2014  . Hypertension   . Diabetes mellitus without complication ([place])   . Hypercholesteremia   . Arthritis   . Prostate cancer ([place])   . Sleep apnea     Past Surgical History:  Procedure Laterality Date  . ADENOIDECTOMY    . FLEXIBLE SIGMOIDOSCOPY N/A 09/12/2013   Procedure: FLEXIBLE SIGMOIDOSCOPY;  Surgeon: Milus Banister, MD;  [facility];  Service: Endoscopy;  Laterality: N/A;  . HERNIA REPAIR    . HOT HEMOSTASIS N/A 09/12/2013   Procedure: HOT HEMOSTASIS (ARGON PLASMA COAGULATION/BICAP);  Surgeon: Milus Banister, MD;  Location: [facility];  Service: Endoscopy;  Laterality: N/A;  . LIPOMA EXCISION    . OTHER SURGICAL HISTORY     s/p prostate radiation  . TONSILLECTOMY    . TOTAL KNEE ARTHROPLASTY Left 03/25/2014   Procedure: LEFT TOTAL KNEE ARTHROPLASTY;  Surgeon: [name], MD;  [facility] ORS;  Service: Orthopedics;  Laterality: Left;  . TOTAL KNEE ARTHROPLASTY Right  05/06/2014   Procedure: RIGHT TOTAL KNEE ARTHROPLASTY;  Surgeon: [name], MD;  Location[facility];  Service: Orthopedics;  Laterality: Right;       Home Medications    Prior to Admission medications   Medication Sig Start Date End Date Taking? Authorizing Provider  amoxicillin (AMOXIL) 500 MG capsule Take 2,000 mg by mouth as directed. 1 hour prior to dental procedures. 12/21/15   [provider]  apixaban (ELIQUIS) 5 MG TABS tablet Take 1 tablet (5  mg total) by mouth 2 (two) times daily. 10/28/16   Ghimire, Henreitta Leber, MD  co-enzyme Q-10 30 MG capsule Take 300 mg by mouth daily.    [provider]  Glucosamine-Chondroitin 1500-1200 MG/30ML LIQD Take 1 tablet by mouth 2 (two) times daily.    [provider]  HUMALOG KWIKPEN 100 UNIT/ML KiwkPen Inject 2-8 Units into the skin 3 (three) times daily.  12/11/15   [provider]  LEVEMIR FLEXTOUCH 100 UNIT/ML Pen Inject 16 Units into the skin daily at 10 pm. As directed.  08/08/16   [provider]  lisinopril-hydrochlorothiazide (PRINZIDE,ZESTORETIC) 10-12.5 MG per tablet Take 1 tablet by mouth every morning.     [provider]  metFORMIN (GLUCOPHAGE) 1000 MG tablet Take 1,000 mg by mouth 2 (two) times daily with a meal.    [provider]  ONGLYZA 5 MG TABS tablet Take 5 mg by mouth at bedtime.  05/11/15   [provider]  pravastatin (PRAVACHOL) 40 MG tablet Take 40 mg by mouth daily with supper.     [provider]    Family History Family History  Problem Relation Age of Onset  . Colon cancer Father   . Stroke Maternal Grandmother   . Esophageal cancer Neg Hx   . Rectal cancer Neg Hx   . Stomach cancer Neg Hx     Social History Social History  Substance Use Topics  . Smoking status: Former Smoker    Start date: 11/28/1982  . Smokeless tobacco: Never Used  . Alcohol use 0.6 oz/week    1 Shots of liquor per week     Comment: occasionally     Allergies   Glyburide   Review of Systems Review of Systems  Constitutional: Positive for appetite change, chills and fatigue. Negative for fever.  Respiratory: Positive for shortness of breath.   Cardiovascular: Negative for chest pain.  Gastrointestinal: Negative for abdominal pain, diarrhea, nausea and vomiting.  Genitourinary: Positive for frequency and hematuria. Negative for difficulty urinating, dysuria and flank pain.  Musculoskeletal: Negative for back pain.    Neurological: Negative for light-headedness, numbness and headaches.  Psychiatric/Behavioral: Negative for confusion.  All other systems reviewed and are negative.    Physical Exam Updated Vital Signs BP (!) 150/85   Pulse 69   Temp 97.6 F (36.4 C) (Oral)   Resp 20   SpO2 100%   Physical Exam  Constitutional: He is oriented to person, place, and time. He appears well-developed and well-nourished. No distress.  HENT:  Head: Normocephalic.  Right Ear: External ear normal.  Left Ear: External ear normal.  Mouth/Throat: Oropharynx is clear and moist.  2cm linear laceration to the forehead near the hairline. Bleeding is controlled. No tenderness to palpation. No deformity or crepitus noted.  Eyes: Conjunctivae and EOM are normal. Pupils are equal, round, and reactive to light. Right eye exhibits no discharge. Left eye exhibits no discharge. No scleral icterus.  Neck: Normal range of motion. No JVD  present. No tracheal deviation present.  Cardiovascular: Normal rate, regular rhythm, normal heart sounds and intact distal pulses.   2+ radial and DP/PT pulses bl, negative Homan's bl   Pulmonary/Chest: Effort normal and breath sounds normal. He exhibits no tenderness.  Increased work of breathing noted when patient told himself upright. Equal rise and fall of chest  Abdominal: Soft. Bowel sounds are normal. He exhibits no distension. There is no tenderness.  Genitourinary:  Genitourinary Comments: No CVA tenderness. Examination performed in the presence of chaperone. No frank blood noted at the rectum. No fissures or tears. Internal hemorrhoid palpated. Prostate is not boggy or tender.   Musculoskeletal: He exhibits no edema or tenderness.  Neurological: He is alert and oriented to person, place, and time. No cranial nerve deficit or sensory deficit.  Fluent speech, no facial droop, sensation intact to light touch globally, slow shuffling gait, patient has difficulty with heel walk and  toe walk without assistance. Pronator drift absent.  Skin: Skin is warm and dry. Capillary refill takes less than 2 seconds.  Psychiatric: He has a normal mood and affect. His behavior is normal.     ED Treatments / Results  Labs (all labs ordered are listed, but only abnormal results are displayed) Labs Reviewed  BASIC METABOLIC PANEL - Abnormal; Notable for the following:       Result Value   Sodium 133 (*)    Potassium 5.6 (*)    CO2 19 (*)    BUN 89 (*)    Creatinine, Ser 5.29 (*)    GFR calc non Af Amer 9 (*)    GFR calc Af Amer 11 (*)    All other components within normal limits  CBC - Abnormal; Notable for the following:    WBC 15.6 (*)    All other components within normal limits  URINALYSIS, ROUTINE W REFLEX MICROSCOPIC - Abnormal; Notable for the following:    APPearance CLOUDY (*)    Hgb urine dipstick LARGE (*)    Leukocytes, UA MODERATE (*)    Bacteria, UA RARE (*)    Squamous Epithelial / LPF 0-5 (*)    All other components within normal limits  CBG MONITORING, ED - Abnormal; Notable for the following:    Glucose-Capillary 119 (*)    All other components within normal limits  POC OCCULT BLOOD, ED - Abnormal; Notable for the following:    Fecal Occult Bld POSITIVE (*)    All other components within normal limits  URINE CULTURE  CULTURE, BLOOD (ROUTINE X 2)  CULTURE, BLOOD (ROUTINE X 2)  SODIUM, URINE, RANDOM  PROTEIN / CREATININE RATIO, URINE    EKG  EKG Interpretation  Date/Time:  Saturday May 06 2017 12:26:56 EDT Ventricular Rate:  80 PR Interval:    QRS Duration: 74 QT Interval:  366 QTC Calculation: 422 R Axis:   114 Text Interpretation:  Atrial flutter with variable A-V block Left posterior fascicular block Anteroseptal infarct , age undetermined Abnormal ECG No significant change since last tracing Confirmed by Orlie Dakin [id number]) on 05/06/2017 4:26:51 PM       Radiology No results found.  Procedures Procedures (including critical  care time)  Medications Ordered in ED Medications  0.9 %  sodium chloride infusion (not administered)  Tdap (BOOSTRIX) injection 0.5 mL (not administered)     Initial Impression / Assessment and Plan / ED Course  I have reviewed the triage vital signs and the nursing notes.  Pertinent labs & imaging results that  were available during my care of the patient were reviewed by me and considered in my medical decision making (see chart for details).     Patient with worsening general fatigue and weakness with associated hematuria, weight loss, shortness of breath/DOE. 2 falls in the past 2 days. Afebrile, hypertensive and somewhat tachypneic while in the ED. No focal neurological deficits. Low suspicion of stroke, ICH, or TBI. Tetanus updated due to well-healing laceration on forehead. EKG with no significant change from last and shows known arrhythmia. Low suspicion of ACS or MI. Labwork shows acute worsening of renal function, with creatinine bumped to 5.29 from 2.1 one month ago, as well as UA concerning for UTI and leukocytosis of 15.6. Urine sent for culture. Bladder scan shows 0 ml's post void. Hospitalist consulted, spoke with Dr. Vilma Meckel who will bring patient into the hospital for further workup and management of ARF.   Final Clinical Impressions(s) / ED Diagnoses   Final diagnoses:  Acute renal failure (ARF) ([place])  Occult blood in stools    New Prescriptions New Prescriptions   No medications on file     Debroah Baller 05/06/17 1800    Orlie Dakin, MD 05/07/17 [phone]

## 2017-05-06 NOTE — H&P (Signed)
History and Physical   William [name] ZOX:[id number] DOB: January 05, 1934 DOA: 05/06/2017  Referring MD/NP/PA: Rodell Perna, PA, EDP PCP: Jani Gravel, MD Outpatient Specialists: Neurology, Dr. Leonie Man; Sleep Medicine Dr. Brett Fairy, Hematology, Dr. Beryle Beams; GI, Dr. Ardis Hughs Patient coming from: Home  Chief Complaint: Falls, weakness  HPI: William [name] is a 83 y.o. male with a history of AFib on eliquis, CVA in Nov 2017, HTN, IDDM, prostate CA,and  OA s/p bilateral TKA who was brought to the ED by his family due to progressive generalized weakness leading to recurrent falls.   History, provided by the patient and family, is of about 1 - 2 weeks of progressive constant generalized weakness, feeling "blah," per the patient, associated with increased urinary frequency (every 10 minutes overnight, up from 4 - 5 times/night at baseline). He feels he is emptying his bladder, but has also had overflow dribbling incontinence after urination. He fell twice 2 days ago and once yesterday, episodes described as his legs giving out, slowly controlling a fall to the ground, one time landed backward with a relative breaking his fall, and yesterday fell forward trying to lean forward to propel himself off the toilet seat. He denies LOC, had minor head trauma with quickly controlled bleeding. He comes to the ED today because of failure of his symptoms to improve.   This has been in the context of an overall decline in health over the past 7 months since being diagnosed with en embolic CVA. He had PT and feels free of any focal deficits, but has slowly been losing appetite, decreased taste, with concomitant 40lbs unintentional weight loss in that time. He remained relatively functional, however, having spent several days hiking with his family in [place], [place] as recently as April. His PCP has been very involved in his care, helping refer him to specialists, having him hold lisinopril and metformin in the setting of continued poor per  oral intake. Labs 04/06/17 showed SCr 2.1 (up from baseline) and BUN 43. He was to see urology for painless hematuria, establish care with hematology for M-spike, and see cardiology for follow up echocardiogram in the coming weeks.   ED Course: On arrival, he appeared nontoxic, afebrile, in rate-controlled, asymptomatic AFlutter. Labwork revealed hyperkalemia (5.6) associated with acute renal failure, creatinine 5.29, BUN 89, and leukocytosis (15.6k) with significant leukocytes and RBCs on a clean catch UA. FOBT also positive, though pt asymptomatic and not anemic. [facility] called to admit. Blood cultures, bladder scan, and urine culture requested.   Review of Systems: Denies fever, chills, changes in vision or hearing, headache, cough, sore throat, chest pain, palpitations, current shortness of breath, abdominal pain, nausea, vomiting, changes in bowel habits, blood in stool, change in bladder habits, myalgias, arthralgias, and rash, and per HPI. All others reviewed and are negative.   Past Medical History:  Diagnosis Date  . Arthritis   . Diabetes mellitus without complication ([place])   . Hypercholesteremia   . Hypertension   . Prostate cancer [facility])    with radiation treatment  . Sleep apnea 2/14   "mild per patient" hasnt gotten CPAP machine  . Stroke [facility])    Past Surgical History:  Procedure Laterality Date  . ADENOIDECTOMY    . FLEXIBLE SIGMOIDOSCOPY N/A 09/12/2013   Procedure: FLEXIBLE SIGMOIDOSCOPY;  Surgeon: Milus Banister, MD;  Location: [facility] ENDOSCOPY;  Service: Endoscopy;  Laterality: N/A;  . HERNIA REPAIR    . HOT HEMOSTASIS N/A 09/12/2013   Procedure: HOT HEMOSTASIS (ARGON PLASMA COAGULATION/BICAP);  Surgeon: Milus Banister, MD;  Location: [facility];  Service: Endoscopy;  Laterality: N/A;  . LIPOMA EXCISION    . OTHER SURGICAL HISTORY     s/p prostate radiation  . TONSILLECTOMY    . TOTAL KNEE ARTHROPLASTY Left 03/25/2014   Procedure: LEFT TOTAL KNEE ARTHROPLASTY;  Surgeon:  [name], MD;  Location: [facility] ORS;  Service: Orthopedics;  Laterality: Left;  . TOTAL KNEE ARTHROPLASTY Right 05/06/2014   Procedure: RIGHT TOTAL KNEE ARTHROPLASTY;  Surgeon: [name], MD;  Location: [facility] ORS;  Service: Orthopedics;  Laterality: Right;   - Remote former smoker, minimal EtOH, very active with family, lives in [place], retired.   Allergies  Allergen Reactions  . Glyburide Other (See Comments)    Hypoglycemia    Family History  Problem Relation Age of Onset  . Colon cancer Father   . Stroke Maternal Grandmother   . Esophageal cancer Neg Hx   . Rectal cancer Neg Hx   . Stomach cancer Neg Hx    - Family history otherwise reviewed and not pertinent.  Prior to Admission medications   Medication Sig Start Date End Date Taking? Authorizing Provider  amoxicillin (AMOXIL) 500 MG capsule Take 2,000 mg by mouth as directed. 1 hour prior to dental procedures. 12/21/15   [provider]  apixaban (ELIQUIS) 5 MG TABS tablet Take 1 tablet (5 mg total) by mouth 2 (two) times daily. 10/28/16   Ghimire, Henreitta Leber, MD  co-enzyme Q-10 30 MG capsule Take 300 mg by mouth daily.    [provider]  Glucosamine-Chondroitin 1500-1200 MG/30ML LIQD Take 1 tablet by mouth 2 (two) times daily.    [provider]  HUMALOG KWIKPEN 100 UNIT/ML KiwkPen Inject 2-8 Units into the skin 3 (three) times daily.  12/11/15   [provider]  LEVEMIR FLEXTOUCH 100 UNIT/ML Pen Inject 16 Units into the skin daily at 10 pm. As directed.  08/08/16   [provider]  lisinopril-hydrochlorothiazide (PRINZIDE,ZESTORETIC) 10-12.5 MG per tablet Take 1 tablet by mouth every morning.     [provider]  metFORMIN (GLUCOPHAGE) 1000 MG tablet Take 1,000 mg by mouth 2 (two) times daily with a meal.    [provider]  ONGLYZA 5 MG TABS tablet Take 5 mg by mouth at bedtime.  05/11/15   [provider]  pravastatin (PRAVACHOL) 40 MG tablet Take 40 mg by  mouth daily with supper.     [provider]    Physical Exam: Vitals:   05/06/17 1630 05/06/17 1645 05/06/17 1700 05/06/17 1730  BP: (!) 160/78 (!) 150/81 (!) 157/81 (!) 157/78  Pulse: 70 70 72 68  Resp: (!) 27 (!) 26 (!) 22 (!) 22  Temp:      TempSrc:      SpO2: 100% 100% 100% 100%   Constitutional: 83 y.o. male in no distress, calm demeanor Eyes: Lids and conjunctivae normal, PERRL ENMT: Mucous membranes are tacky. Posterior pharynx clear of any exudate or lesions. Fair dentition.  Neck: normal, supple, no masses, no thyromegaly Respiratory: Non-labored breathing room air without accessory muscle use. Clear breath sounds to auscultation bilaterally Cardiovascular: Irreg, rate in 70's, no murmurs, rubs, or gallops. No carotid bruits. No JVD. No LE edema. 2+ pedal pulses. Abdomen: Normoactive bowel sounds. No tenderness with attention suprapubically, non-distended, and no masses palpated. No hepatosplenomegaly. GU: No indwelling catheter Musculoskeletal: No clubbing / cyanosis. No joint deformity upper and lower extremities. Good ROM, no contractures. Normal muscle tone.  Skin: Warm, dry. Scattered SebK's. Ecchymosis on right AC fossa, faint ecchymosis on left flank and bilateral abdomen. Superficial transverse laceration at hairline on central/left forehead/scalp, hemostatic without erythema.  Neurologic: CN II-XII grossly intact. Gait not assessed. Speech normal, slow. No focal deficits in motor strength or sensation in all extremities. DTRs 2+. No asterixis.  Psychiatric: Alert and oriented x3. Normal judgment and insight. Short-term memory seems mildly impaired. Mood euthymic, witty with conversation, with congruent affect.   CBC:  Recent Labs Lab 05/06/17 1224  WBC 15.6*  HGB 13.7  HCT 40.3  MCV 90.0  PLT 902   Basic Metabolic Panel:  Recent Labs Lab 05/06/17 1224  NA 133*  K 5.6*  CL 103  CO2 19*  GLUCOSE 92  BUN 89*  CREATININE 5.29*  CALCIUM 9.1    Urine analysis:    Component Value Date/Time   COLORURINE YELLOW 05/06/2017 1612   APPEARANCEUR CLOUDY (A) 05/06/2017 1612   LABSPEC 1.012 05/06/2017 1612   PHURINE 5.0 05/06/2017 1612   GLUCOSEU NEGATIVE 05/06/2017 1612   HGBUR LARGE (A) 05/06/2017 1612   BILIRUBINUR NEGATIVE 05/06/2017 1612   KETONESUR NEGATIVE 05/06/2017 1612   PROTEINUR NEGATIVE 05/06/2017 1612   UROBILINOGEN 1.0 04/29/2014 1033   NITRITE NEGATIVE 05/06/2017 1612   LEUKOCYTESUR MODERATE (A) 05/06/2017 1612   EKG: Independently reviewed. Rate-controlled atrial flutter similar to previous tracings. T waves not more prominent/peaked. QTc 435msec.  Assessment/Plan Active Problems:   IDDM (insulin dependent diabetes mellitus) ([place])   Prostate cancer ([facility])   Sleep apnea   Acute renal failure (ARF) ([facility])   Acute lower UTI   Occult GI bleeding   UTI: - Monitor urine culture - Blood cultures drawn, so will start empiric ceftriaxone  Acute renal failure: Suspect chronic element to kidney disease with HTN, DM, but acutely worsened pre- and possibly post-renally. FENa 0.68% - Bladder scan showed no retention, so ok to give IVF's given low FENa. Will be judicious with G2DD on last echo. - Give kayexalate for mild hyperkalemia without ECG changes.  - Strict I/O, monitor UOP - Renal US - Continue to hold lisinopril and metformin as he has wisely been doing.  - Hold eliquis while GFR so impaired - Monitor BMP in AM - If not improving, would seek nephrology input  Weakness, falls: No syncope. Suspect due subacutely to deconditioning post-CVA, with low grade uremia contributing and acutely worsened by infection.  - Pt consult - Check TSH - Treat conditions as above  History of prostate CA: With increased LUTS, UTI, unintentional weight loss and reported painless hematuria.  - Check PSA - Will need urology follow up  Occult GI bleeding: +FOBT in the setting of NOAC + renal failure. Fortunately no anemia. With  h/o radiation proctitis, last Cscope by [facility] reportedly negative, no f/u recommended at that time.  - Discussed risks/benefits of holding anticoagulation in this setting, will start heparin.  - No indication for urgent work up. Will follow up with Dr. Ardis Hughs  IDT2DM: HbA1c 6.8% in 2017, controlled for age. - Hold levemir for now with poor po and renal failure.  - SSI, titrate long-acting if needed  Atrial flutter: Rate controlled - Continue anticoagulation with heparin for now, restart eliquis once GFR improves. - Not on rate-controlling medications. Note ECGs prior to A flutter onset had prolonged PR interval, so if rate remains controlled, would prefer to avoid BB/CCBs.   Essential HTN: Mildly elevated in ED - Holding lisinopril and thiazide diuretic due to renal  failure - Hydralazine prn ordered for severe BP elevations  M-spike: Reported, I do not see that result. 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 No anemia and normal Ca reassuring.  - Hematology referral as outpatient in progress  History of OSA: Does not tolerate PAP qHS, so this will not be ordered.  DVT prophylaxis: IV heparin Code Status: Full confirmed at admission  Family Communication: Wife and daughter at bedside Disposition Plan: Admit to inpatient, telemetry. Therapy evaluation ordered. Uncertain of ultimate disposition at this time Consults called: None   Vance Gather, MD [facility] Pager [phone]  If 7PM-7AM, please contact night-coverage [email or web address] Password [email or web address] 05/06/2017, 5:58 PM

## 2017-05-06 NOTE — ED Notes (Signed)
ED Provider at bedside. 

## 2017-05-06 NOTE — ED Notes (Signed)
Pt ambulatory across the room with slightly unsteady gait but was able to walk without assistance

## 2017-05-06 NOTE — ED Triage Notes (Signed)
Family reports that pt has had weakness for past 3 weeks. Pt currently under eval and testing with multiple doctors. Pt weakness has increased past few days where his legs have given out on him causing falls.

## 2017-05-06 NOTE — ED Notes (Signed)
Patient transported to US 

## 2017-05-06 NOTE — ED Notes (Signed)
Report attempted x 1

## 2017-05-06 NOTE — ED Provider Notes (Signed)
Complains of generalized weakness for the past 2 weeks. He's been noted to have He's had no nausea or vomiting. He's fallen several times over the past 2 weeks. Not striking his head except for the last time where he eased himself to the ground and struck his forehead gently. He had bloodwork performed as an outpatient by his PMD on 04/06/2017 which showed BUN of 43 creatinine 2.1 He denies any headache denies neck pain denies loss of consciousness. On exam he is alert nontoxic chronically ill-appearing not acutely ill-appearing HEENT exam there is a tiny abrasion at the center of his forehead without surrounding hematoma or swelling. Neck supple lungs clear auscultation heart regular rate and rhythm abdomen nondistended nontender.   Orlie Dakin, MD 05/06/17 [phone]

## 2017-05-07 ENCOUNTER — Inpatient Hospital Stay (HOSPITAL_COMMUNITY): Payer: Medicare Other | Admitting: Anesthesiology

## 2017-05-07 ENCOUNTER — Inpatient Hospital Stay (HOSPITAL_COMMUNITY): Payer: Medicare Other

## 2017-05-07 ENCOUNTER — Encounter (HOSPITAL_COMMUNITY)
Admission: EM | Disposition: A | Discharge status: Home Health | Payer: Self-pay | Source: Home / Self Care | Attending: Internal Medicine

## 2017-05-07 ENCOUNTER — Encounter (HOSPITAL_COMMUNITY): Payer: Self-pay | Admitting: *Deleted

## 2017-05-07 DIAGNOSIS — N131 Hydronephrosis with ureteral stricture, not elsewhere classified: Secondary | ICD-10-CM

## 2017-05-07 HISTORY — PX: CYSTOSCOPY W/ URETERAL STENT PLACEMENT: SHX1429

## 2017-05-07 LAB — BASIC METABOLIC PANEL
Anion gap: 10 (ref 5–15)
Anion gap: 7 (ref 5–15)
BUN: 92 mg/dL — ABNORMAL HIGH (ref 6–20)
BUN: 74 mg/dL — AB (ref 6–20)
CHLORIDE: 106 mmol/L (ref 101–111)
CO2: 20 mmol/L — ABNORMAL LOW (ref 22–32)
CO2: 17 mmol/L — ABNORMAL LOW (ref 22–32)
Calcium: 8.8 mg/dL — ABNORMAL LOW (ref 8.9–10.3)
Calcium: 6.8 mg/dL — ABNORMAL LOW (ref 8.9–10.3)
Chloride: 116 mmol/L — ABNORMAL HIGH (ref 101–111)
Creatinine, Ser: 5.01 mg/dL — ABNORMAL HIGH (ref 0.61–1.24)
Creatinine, Ser: 3.35 mg/dL — ABNORMAL HIGH (ref 0.61–1.24)
GFR calc Af Amer: 19 mL/min — ABNORMAL LOW (ref >60)
GFR, EST AFRICAN AMERICAN: 11 mL/min — AB (ref >60)
GFR, EST NON AFRICAN AMERICAN: 10 mL/min — AB (ref >60)
GFR, EST NON AFRICAN AMERICAN: 16 mL/min — AB (ref >60)
GLUCOSE: 177 mg/dL — AB (ref 65–99)
Glucose, Bld: 155 mg/dL — ABNORMAL HIGH (ref 65–99)
POTASSIUM: 5.4 mmol/L — AB (ref 3.5–5.1)
Potassium: 3.9 mmol/L (ref 3.5–5.1)
SODIUM: 136 mmol/L (ref 135–145)
SODIUM: 140 mmol/L (ref 135–145)

## 2017-05-07 LAB — CBC
HCT: 34.5 % — ABNORMAL LOW (ref 39.0–52.0)
Hemoglobin: 11.7 g/dL — ABNORMAL LOW (ref 13.0–17.0)
MCH: 30.3 pg (ref 26.0–34.0)
MCHC: 33.9 g/dL (ref 30.0–36.0)
MCV: 89.4 fL (ref 78.0–100.0)
PLATELETS: 217 10*3/uL (ref 150–400)
RBC: 3.86 MIL/uL — AB (ref 4.22–5.81)
RDW: 14.5 % (ref 11.5–15.5)
WBC: 12.6 10*3/uL — AB (ref 4.0–10.5)

## 2017-05-07 LAB — HEPARIN LEVEL (UNFRACTIONATED)
HEPARIN UNFRACTIONATED: 1.78 [IU]/mL — AB (ref 0.30–0.70)
Heparin Unfractionated: 1.27 IU/mL — ABNORMAL HIGH (ref 0.30–0.70)

## 2017-05-07 LAB — GLUCOSE, CAPILLARY
GLUCOSE-CAPILLARY: 148 mg/dL — AB (ref 65–99)
GLUCOSE-CAPILLARY: 176 mg/dL — AB (ref 65–99)
GLUCOSE-CAPILLARY: 165 mg/dL — AB (ref 65–99)
Glucose-Capillary: 158 mg/dL — ABNORMAL HIGH (ref 65–99)
Glucose-Capillary: 199 mg/dL — ABNORMAL HIGH (ref 65–99)

## 2017-05-07 LAB — SURGICAL PCR SCREEN
MRSA, PCR: NEGATIVE
Staphylococcus aureus: NEGATIVE

## 2017-05-07 LAB — APTT
APTT: 111 s — AB (ref 24–36)
aPTT: 41 seconds — ABNORMAL HIGH (ref 24–36)

## 2017-05-07 SURGERY — CYSTOSCOPY, WITH RETROGRADE PYELOGRAM AND URETERAL STENT INSERTION
Anesthesia: General | Laterality: Bilateral

## 2017-05-07 MED ORDER — METHYLENE BLUE 0.5 % INJ SOLN
INTRAVENOUS | Status: AC
  Filled 2017-05-07: qty 10

## 2017-05-07 MED ORDER — METHYLENE BLUE 0.5 % INJ SOLN
INTRAVENOUS | Status: DC | PRN
  Administered 2017-05-07: 25 mg via INTRAVENOUS

## 2017-05-07 MED ORDER — LABETALOL HCL 5 MG/ML IV SOLN
INTRAVENOUS | Status: AC
  Administered 2017-05-07: 5 mg via INTRAVENOUS
  Filled 2017-05-07: qty 4

## 2017-05-07 MED ORDER — IOPAMIDOL (ISOVUE-300) INJECTION 61%
INTRAVENOUS | Status: DC | PRN
  Administered 2017-05-07: 50 mL via INTRAVENOUS

## 2017-05-07 MED ORDER — ORAL CARE MOUTH RINSE
15.0000 mL | Freq: Two times a day (BID) | OROMUCOSAL | Status: DC
  Administered 2017-05-07 – 2017-05-13 (×10): 15 mL via OROMUCOSAL

## 2017-05-07 MED ORDER — LABETALOL HCL 5 MG/ML IV SOLN
5.0000 mg | INTRAVENOUS | Status: DC | PRN
  Administered 2017-05-07: 5 mg via INTRAVENOUS

## 2017-05-07 MED ORDER — IOPAMIDOL (ISOVUE-300) INJECTION 61%
INTRAVENOUS | Status: AC
  Filled 2017-05-07: qty 50

## 2017-05-07 MED ORDER — SODIUM CHLORIDE 0.9 % IV SOLN
INTRAVENOUS | Status: DC
  Administered 2017-05-07 – 2017-05-08 (×2): via INTRAVENOUS

## 2017-05-07 MED ORDER — FENTANYL CITRATE (PF) 100 MCG/2ML IJ SOLN: 25.0000 ug | INTRAMUSCULAR | Status: DC | PRN

## 2017-05-07 MED ORDER — PROPOFOL 10 MG/ML IV BOLUS
INTRAVENOUS | Status: DC | PRN
  Administered 2017-05-07: 100 mg via INTRAVENOUS
  Administered 2017-05-07: 20 mg via INTRAVENOUS
  Administered 2017-05-07: 30 mg via INTRAVENOUS
  Administered 2017-05-07: 20 mg via INTRAVENOUS

## 2017-05-07 MED ORDER — FENTANYL CITRATE (PF) 250 MCG/5ML IJ SOLN
INTRAMUSCULAR | Status: AC
  Filled 2017-05-07: qty 5

## 2017-05-07 MED ORDER — STERILE WATER FOR IRRIGATION IR SOLN
Status: DC | PRN
  Administered 2017-05-07: 3000 mL

## 2017-05-07 MED ORDER — SODIUM CHLORIDE 0.9 % IV SOLN
INTRAVENOUS | Status: DC | PRN
  Administered 2017-05-07: 13:00:00 via INTRAVENOUS

## 2017-05-07 MED ORDER — SODIUM POLYSTYRENE SULFONATE 15 GM/60ML PO SUSP: 45.0000 g | Freq: Once | ORAL | Status: DC

## 2017-05-07 MED ORDER — FENTANYL CITRATE (PF) 100 MCG/2ML IJ SOLN
INTRAMUSCULAR | Status: DC | PRN
  Administered 2017-05-07: 25 ug via INTRAVENOUS
  Administered 2017-05-07: 50 ug via INTRAVENOUS
  Administered 2017-05-07 (×7): 25 ug via INTRAVENOUS

## 2017-05-07 MED ORDER — PROPOFOL 10 MG/ML IV BOLUS
INTRAVENOUS | Status: AC
  Filled 2017-05-07: qty 20

## 2017-05-07 MED ORDER — ONDANSETRON HCL 4 MG/2ML IJ SOLN
INTRAMUSCULAR | Status: DC | PRN
Start: 2017-05-07 — End: 2017-05-07
  Administered 2017-05-07: 4 mg via INTRAVENOUS

## 2017-05-07 SURGICAL SUPPLY — 25 items
APL SKNCLS STERI-STRIP NONHPOA (GAUZE/BANDAGES/DRESSINGS)
BAG URINE DRAINAGE (UROLOGICAL SUPPLIES) ×3 IMPLANT
BAG URO CATCHER STRL LF (MISCELLANEOUS) ×3 IMPLANT
BENZOIN TINCTURE PRP APPL 2/3 (GAUZE/BANDAGES/DRESSINGS) IMPLANT
CATH FOLEY 2WAY SLVR  5CC 16FR (CATHETERS)
CATH FOLEY 2WAY SLVR 5CC 16FR (CATHETERS) IMPLANT
CATH URET 5FR 28IN OPEN ENDED (CATHETERS) ×2 IMPLANT
GLOVE BIO SURGEON STRL SZ7.5 (GLOVE) ×3 IMPLANT
GOWN STRL REUS W/ TWL LRG LVL3 (GOWN DISPOSABLE) ×1 IMPLANT
GOWN STRL REUS W/TWL LRG LVL3 (GOWN DISPOSABLE) ×3
GUIDEWIRE ANG ZIPWIRE 038X150 (WIRE) ×4 IMPLANT
GUIDEWIRE STR DUAL SENSOR (WIRE) ×3 IMPLANT
KIT ROOM TURNOVER OR (KITS) ×3 IMPLANT
MANIFOLD NEPTUNE WASTE (CANNULA) ×3 IMPLANT
NS IRRIG 1000ML POUR BTL (IV SOLUTION) ×6 IMPLANT
PACK CYSTO (CUSTOM PROCEDURE TRAY) ×3 IMPLANT
PAD ARMBOARD 7.5X6 YLW CONV (MISCELLANEOUS) ×6 IMPLANT
PLUG CATH AND CAP STER (CATHETERS) IMPLANT
STENT URET 6FRX24 CONTOUR (STENTS) IMPLANT
STENT URET 6FRX26 CONTOUR (STENTS) ×4 IMPLANT
SYR CONTROL 10ML LL (SYRINGE) ×3 IMPLANT
SYRINGE TOOMEY DISP (SYRINGE) IMPLANT
UNDERPAD 30X30 (UNDERPADS AND DIAPERS) ×3 IMPLANT
WATER STERILE IRR 1000ML POUR (IV SOLUTION) ×3 IMPLANT
WIRE COONS/BENSON .038X145CM (WIRE) IMPLANT

## 2017-05-07 NOTE — OR Nursing (Addendum)
Dr. Junious Silk at bedside. Dr. Junious Silk assessed William [name] and is aware of hematuria.  I asked for clarification regarding heparin gtt.  Dr. Junious Silk stated it was okay to start it back.  Heparin restarted at 1150 units per hour.

## 2017-05-07 NOTE — Transfer of Care (Signed)
Immediate Anesthesia Transfer of Care Note  Patient: William [name]  Procedure(s) Performed: Procedure(s): CYSTOSCOPY WITH BILATERAL RETROGRADE PYELOGRAM CYSTOGRAM BILATERAL URETERAL STENT PLACEMENT (Bilateral)  Patient Location: PACU  Anesthesia Type:General  Level of Consciousness: awake and alert   Airway & Oxygen Therapy: Patient Spontanous Breathing and Patient connected to nasal cannula oxygen  Post-op Assessment: Report given to RN and Post -op Vital signs reviewed and stable  Post vital signs: Reviewed and stable  Last Vitals:  Vitals:   05/07/17 0445 05/07/17 1508  BP: (!) 147/72 (!) 188/108  Pulse: 79 79  Resp: 19 (!) 23  Temp: 37 C 36.3 C    Last Pain:  Vitals:   05/07/17 0800  TempSrc:   PainSc: 0-No pain         Complications: No apparent anesthesia complications

## 2017-05-07 NOTE — Evaluation (Signed)
Physical Therapy Evaluation Patient Details Name: William [name] MRN: [id number] DOB: 10-Nov-1934 Today's Date: 05/07/2017   History of Present Illness  William [name] is a 83 y.o. male with a history of AFib on eliquis, CVA in Nov 2017, HTN, IDDM, prostate CA,and  OA s/p bilateral TKA who was brought to the ED by his family due to progressive generalized weakness leading to recurrent falls. Complex UTI, for cytoscopy  Clinical Impression   Pt admitted with above diagnosis. Pt currently with functional limitations due to the deficits listed below (see PT Problem List). Presents with generalized weakness and tends to reach out for UE support while walking; will continue to follow and discern most optimal assistive device (cane vs RW) for William [name]; As he improves medically, I anticipate good mobility gains as well; worth considering HHPT follow up, but may progress well enough to not need it;  Pt will benefit from skilled PT to increase their independence and safety with mobility to allow discharge to the venue listed below.       Follow Up Recommendations Home health PT;Supervision/Assistance - 24 hour;Other (comment) (if progresses well, likely won't need HHPT; will monitor)    Equipment Recommendations  Rolling walker with 5" wheels;Cane (RW versus cane -- to be determined) noted has had bil TKAs -- may already have equipment   Recommendations for Other Services       Precautions / Restrictions Precautions Precautions: Fall      Mobility  Bed Mobility Overal bed mobility: Needs Assistance Bed Mobility: Supine to Sit     Supine to sit: Min assist     General bed mobility comments: Min handheld assist to pull to sit  Transfers Overall transfer level: Needs assistance Equipment used: None Transfers: Sit to/from Stand Sit to Stand: Min assist         General transfer comment: min assist to steady at initial stand; noted he tends to reach out for UE  support  Ambulation/Gait Ambulation/Gait assistance: Min assist Ambulation Distance (Feet): 15 Feet Assistive device: 1 person hand held assist (and pushing RW) Gait Pattern/deviations: Step-through pattern     General Gait Details: Noted he reaches out for UE supoprt; got support from pushing IV pole on L and handheld assist on R  Stairs            Wheelchair Mobility    Modified Rankin (Stroke Patients Only)       Balance Overall balance assessment: Needs assistance           Standing balance-Leahy Scale: Poor Standing balance comment: Tending to reach out for UE support in standing                             Pertinent Vitals/Pain Pain Assessment: No/denies pain    Home Living Family/patient expects to be discharged to:: Private residence Living Arrangements: Spouse/significant other Available Help at Discharge: Family;Available 24 hours/day Type of Home: House Home Access: Stairs to enter Entrance Stairs-Rails: None Entrance Stairs-Number of Steps: 1 Home Layout: One level Home Equipment: Grab bars - tub/shower;Cane - single point;Shower seat - built in      Prior Function Level of Independence: Independent         Comments: drives, takes Proofreader, has been [employer] for a few years, loves to travel     Hand Dominance   Dominant Hand: Left    Extremity/Trunk Assessment   Upper Extremity Assessment Upper  Extremity Assessment: Generalized weakness    Lower Extremity Assessment Lower Extremity Assessment: Generalized weakness       Communication   Communication: HOH  Cognition Arousal/Alertness: Awake/alert Behavior During Therapy: WFL for tasks assessed/performed Overall Cognitive Status: Within Functional Limits for tasks assessed                                        General Comments General comments (skin integrity, edema, etc.): Pt and family are quite encouraged that he moves  considerably better than at admission    Exercises     Assessment/Plan    PT Assessment Patient needs continued PT services  PT Problem List Decreased strength;Decreased activity tolerance;Decreased balance;Decreased mobility;Decreased knowledge of use of DME;Decreased knowledge of precautions       PT Treatment Interventions DME instruction;Gait training;Stair training;Functional mobility training;Therapeutic activities;Therapeutic exercise;Patient/family education;Balance training    PT Goals (Current goals can be found in the Care Plan section)  Acute Rehab PT Goals Patient Stated Goal: back to normal PT Goal Formulation: With patient Time For Goal Achievement: 05/21/17 Potential to Achieve Goals: Good    Frequency Min 3X/week   Barriers to discharge        Co-evaluation               AM-PAC PT "6 Clicks" Daily Activity  Outcome Measure Difficulty turning over in bed (including adjusting bedclothes, sheets and blankets)?: A Little Difficulty moving from lying on back to sitting on the side of the bed? : A Lot Difficulty sitting down on and standing up from a chair with arms (e.g., wheelchair, bedside commode, etc,.)?: Total Help needed moving to and from a bed to chair (including a wheelchair)?: A Little Help needed walking in hospital room?: A Little Help needed climbing 3-5 steps with a railing? : A Lot 6 Click Score: 14    End of Session Equipment Utilized During Treatment: Gait belt Activity Tolerance: Patient tolerated treatment well Patient left: in bed;with call bell/phone within reach;with nursing/sitter in room (Nursing staff prepping him for porcedure) Nurse Communication: Mobility status PT Visit Diagnosis: Unsteadiness on feet (R26.81);Other abnormalities of gait and mobility (R26.89);Muscle weakness (generalized) (M62.81)    Time: 6808-8110 PT Time Calculation (min) (ACUTE ONLY): 19 min   Charges:   PT Evaluation $PT Eval Moderate Complexity:  1 Procedure     PT G Codes:        Roney Marion, PT  Acute Rehabilitation Services Pager [phone] Office [phone]   [place] 05/07/2017, 10:54 AM

## 2017-05-07 NOTE — Anesthesia Preprocedure Evaluation (Addendum)
Anesthesia Evaluation  Patient identified by MRN, date of birth, ID band Patient awake    Reviewed: Allergy & Precautions, NPO status , Patient's Chart, lab work & pertinent test results  Airway Mallampati: II  TM Distance: >3 FB     Dental   Pulmonary sleep apnea , former smoker,    breath sounds clear to auscultation       Cardiovascular hypertension,  Rhythm:Regular Rate:Normal     Neuro/Psych    GI/Hepatic negative GI ROS, Neg liver ROS,   Endo/Other  diabetes  Renal/GU Renal disease     Musculoskeletal  (+) Arthritis ,   Abdominal   Peds  Hematology  (+) anemia ,   Anesthesia Other Findings   Reproductive/Obstetrics                            Anesthesia Physical Anesthesia Plan  ASA: III  Anesthesia Plan: General   Post-op Pain Management:    Induction: Intravenous  PONV Risk Score and Plan: 4 or greater and Ondansetron, Dexamethasone, Propofol, Midazolam, Scopolamine patch - Pre-op and Treatment may vary due to age or medical condition  Airway Management Planned: LMA  Additional Equipment:   Intra-op Plan: Delibrate Circulatory arrest per surgeon request  Post-operative Plan: Extubation in OR  Informed Consent: I have reviewed the patients History and Physical, chart, labs and discussed the procedure including the risks, benefits and alternatives for the proposed anesthesia with the patient or authorized representative who has indicated his/her understanding and acceptance.   Dental advisory given  Plan Discussed with: CRNA and Anesthesiologist  Anesthesia Plan Comments:       Anesthesia Quick Evaluation

## 2017-05-07 NOTE — Progress Notes (Signed)
[name] Subjective: Patient reports no complaints. This is a late entry. Pt was seen and examined pre-operatively with his other daughter in pre-op.   Objective: Vital signs in last 24 hours: Temp:  [97.6 F (36.4 C)-98.6 F (37 C)] 98.6 F (37 C) (06/10 0445) Pulse Rate:  [68-79] 79 (06/10 0445) Resp:  [17-36] 19 (06/10 0445) BP: (141-160)/(69-89) 147/72 (06/10 0445) SpO2:  [97 %-100 %] 97 % (06/10 0445) Weight:  [93.2 kg (205 lb 7.5 oz)] 93.2 kg (205 lb 7.5 oz) (06/10 0457)  Intake/Output from previous day: 06/09 0701 - 06/10 0700 In: 1281.4 [P.O.:120; I.V.:1111.4; IV Piggyback:50] Out: 50 [Urine:50] Intake/Output this shift: Total I/O In: 800 [I.V.:800] Out: 50 [Blood:50]  Physical Exam:  NAD Alert  abd - soft, NT Urine clear   Lab Results:  Recent Labs  05/06/17 1224 05/07/17 0403  HGB 13.7 11.7*  HCT 40.3 34.5*   BMET  Recent Labs  05/06/17 2040 05/07/17 0403  NA 133* 136  K 5.2* 5.4*  CL 105 106  CO2 16* 20*  GLUCOSE 118* 155*  BUN 90* 92*  CREATININE 5.00* 5.01*  CALCIUM 8.8* 8.8*   No results for input(s): LABPT, INR in the last 72 hours. No results for input(s): LABURIN in the last 72 hours. Results for orders placed or performed during the hospital encounter of 05/06/17  Culture, blood (routine x 2)     Status: None (Preliminary result)   Collection Time: 05/06/17  4:53 PM  Result Value Ref Range Status   Specimen Description BLOOD RIGHT FOREARM  Final   Special Requests   Final    BOTTLES DRAWN AEROBIC AND ANAEROBIC Blood Culture adequate volume   Culture NO GROWTH < 24 HOURS  Final   Report Status PENDING  Incomplete  Culture, blood (routine x 2)     Status: None (Preliminary result)   Collection Time: 05/06/17  5:10 PM  Result Value Ref Range Status   Specimen Description BLOOD LEFT ANTECUBITAL  Final   Special Requests   Final    BOTTLES DRAWN AEROBIC AND ANAEROBIC Blood Culture adequate volume   Culture NO GROWTH < 24 HOURS   Final   Report Status PENDING  Incomplete  Surgical pcr screen     Status: None   Collection Time: 05/07/17 10:31 AM  Result Value Ref Range Status   MRSA, PCR NEGATIVE NEGATIVE Final   Staphylococcus aureus NEGATIVE NEGATIVE Final    Comment:        The Xpert SA Assay (FDA approved for NASAL specimens in patients over 84 years of age), is one component of a comprehensive surveillance program.  Test performance has been validated by [facility] for patients greater than or equal to 51 year old. It is not intended to diagnose infection nor to guide or monitor treatment.     Studies/Results: US Renal  Result Date: 05/06/2017 CLINICAL DATA:  Acute renal failure EXAM: RENAL / URINARY TRACT ULTRASOUND COMPLETE COMPARISON:  CT abdomen/ pelvis dated 04/14/2017 FINDINGS: Right Kidney: Length: 13.8 cm.  Moderate right hydronephrosis, new. Left Kidney: Length: 14.8 cm.  Moderate left hydronephrosis, unchanged Bladder: Not discretely visualized. Additional comments:  Right pleural effusion.  Ascites. IMPRESSION: Moderate left hydronephrosis, unchanged from prior CT. Moderate right hydronephrosis, new. Bladder is not discretely visualized. Electronically Signed   By: Julian Hy M.D.   On: 05/06/2017 18:56  I reviewed the renal ultrasound and CT images again.  Assessment/Plan: Left greater than right hydronephrosis and a patient with  prior radiation therapy for prostate cancer.-His creatinine remains elevated. Urine output only recorded as 50 mL, but pt is incontinent. The patient's daughter got her mother on the cell phone and over speaker I went over again the nature risks benefits and alternatives to ureteral stents including doing nothing or nephrostomy tubes. We discussed it might be difficult to find the ureteral orifices and that stents can fail. All questions answered and they elected to proceed.   LOS: 1 day   Tyreque Finken 05/07/2017, 2:57 PM

## 2017-05-07 NOTE — Progress Notes (Signed)
New Admission Note:   Arrival Method:   Via stretcher from the ED Mental Orientation:  A & O x 4 Telemetry:   Placed Tele Box [phone] Assessment: Completed Skin:  See assessment IV:  RAFA Pain:   Denies Tubes:  N/A Safety Measures: Safety Fall Prevention Plan has been given, discussed and signed Admission: Completed [address] [place] Orientation: Patient has been orientated to the room, unit and staff.  Family:  Multiple family members at bedside including wife and daughter  Orders have been reviewed and implemented. Will continue to monitor the patient. Call light has been placed within reach and bed alarm has been activated.   Earleen Reaper RN- [place], [place] Phone number: [phone]

## 2017-05-07 NOTE — Progress Notes (Signed)
ANTICOAGULATION CONSULT NOTE - Follow Up Consult  Pharmacy Consult for Heparin (apixaban on hold) Indication: atrial fibrillation  Allergies  Allergen Reactions  . Glyburide Other (See Comments)    Hypoglycemia     Patient Measurements: Height: 6\' 1"  (185.4 cm) Weight: 205 lb 7.5 oz (93.2 kg) IBW/kg (Calculated) : 79.9  Vital Signs: Temp: 98.6 F (37 C) (06/10 0445) Temp Source: Oral (06/10 0445) BP: 147/72 (06/10 0445) Pulse Rate: 79 (06/10 0445)  Labs:  Recent Labs  05/06/17 1224 05/06/17 2040 05/07/17 0403  HGB 13.7  --  11.7*  HCT 40.3  --  34.5*  PLT 243  --  217  APTT  --   --  111*  CREATININE 5.29* 5.00* 5.01*    Estimated Creatinine Clearance: 13.3 mL/min (A) (by C-G formula based on SCr of 5.01 mg/dL (H)).   Assessment: 83 y/o M on heparin for afib while apixaban on hold, aPTT is elevated this AM, using aPTT to dose for now given apixaban influence on anti-Xa levels.   Goal of Therapy:  Heparin level 0.3-0.7 units/ml aPTT 66-102 seconds Monitor platelets by anticoagulation protocol: Yes   Plan:  -Dec heparin to 1150 units/hr -1500 aPTT/HL  Narda Bonds 05/07/2017,6:42 AM

## 2017-05-07 NOTE — Op Note (Signed)
Preoperative diagnosis: Bilateral hydronephrosis, acute renal failure Postoperative diagnosis: Same  Procedure: Exam under anesthesia, cystoscopy with bilateral retrograde pyelogram and bilateral ureteral stent placement, cystogram  Surgeon: Junious Silk  Anesthesia: Gen.  Indication for procedure: 83 year old with progressive left greater than right hydronephrosis over the past couple months with a creatinine rising from 1-5. CT showed may 18th 2018 showed left hydronephrosis and an ultrasound done 05/06/2017 revealed new right-sided hydroureteronephrosis and moderate left-sided hydroureteronephrosis.  Findings: On exam under anesthesia the penis uncircumcised and without mass or lesion, the testicles were descended bilaterally and palpably normal, there was a small left hydrocele. On digital rectal exam the prostate was smooth and indurated consistent with prior radiation. No masses were palpable.  On cystoscopy there was a moderate stricture of the bulbar urethra, the prostate was widely patent and nonobstructive the bladder was small capacity and noncompliant. Visualization was difficult because the bladder could not be adequately irrigated and the mucosa had bullous edema and radiation effect with a lot of neovascularity that bled easily. No specific mass was noted. There was a more nodular area up in the right. But difficult to tell. The bladder held about 30 mL. The ureteral orifices were located in the normal orthotopic position but were tiny and difficult to find. The extremely limited capacity certainly explains a lot of his frequency and nocturia.  Right retrograde pyelogram-this outlined a single ureter single collecting system unit. There was some dilation of the distal ureter which narrowed into a narrow mid ureter which expanded above the iliacs into a dilated proximal ureter with a tortuous proximal segment and look for secondary UPJ obstruction. The collecting system was mildly dilated.  No obvious filling defect was noted.  Left retrograde pyelogram-this outlined a single ureter single collecting system unit with a normal distal ureter but the mid ureter was very narrow even more so than the right. He transitioned proximally into a dilated ureter and again there was a proximal tortuous turn that they have had a filling defect or was simply the wall of the ureter returning into a more severely dilated collecting system than the right.  Cystogram-this outlined the bladder with about a 30-40 mL capacity. The stent coils were in good position in the Foley balloon was visible as a filling defect within the bladder. There was no extravasation.   Description of procedure: After consent was obtained patient brought to the operating room. After adequate anesthesia he was placed in lithotomy position and prepped and draped in the usual sterile fashion. A timeout was performed to confirm the patient and procedure. The cystoscope was passed per urethra and the bladder examined with the 30 and 70 lens. Visualization was cloudy and bloody and had to continually fill and empty the bladder. Bladder capacity severely reduced which made irrigation difficult. I then took a 5 [place] open-ended catheter and tried to locate the right ureteral orifice but could not. I then probed around for the right ureteral orifice with a sensor wire through the 5 French catheter was able to locate it. Advanced catheter into the distal ureter and remove the wire and retrograde injection of contrast was performed. A Glidewire was then replaced and coiled in the upper pole collecting system. Over the Glidewire 6 x 26 cm right ureteral stent was advanced and the wire removed. A good coil seen in the kidney and a good coil in the bladder.  I then turned my attention to the left ureteral orifice. The trigone neck and had a lot of  erythema and edema in the ureteral orifices were very tiny. But I was able to finally locate the left  ureteral orifice advanced a sensor wire and the 5 French catheter into the left distal ureter. The wire was removed and retrograde injection of contrast was performed. I then advanced a Glidewire into the collecting system and removed the 5 [place] catheter. A 6 x 26 cm stent was advanced and the wire removed with a good coil seen in the kidney and a good coil in the bladder. The scope was removed and I placed an 59 [place] coud catheter but it wouldn't go all the way in and did not irrigated either. I could not tell if it was improperly positioned orifice the bladder was truly that small. Therefore I removed it and replaced it and it seemed to go and but I could not irrigate. Therefore I put the scope back and slid the Glidewire and and then over the Glidewire passed a 16 French [place] catheter and it did the same thing but irrigated normally would only with about 30-40 mL before he would void around the catheter. A quick cystogram confirm the catheter was in the bladder but his bladder capacity was just extremely limited. The patient was then awakened and taken to the recovery room in stable condition.  Complications: None  Blood loss: Minimal  Specimens: None  Drains: 16 French Foley catheter Bilateral 6 x 26 cm ureteral stents  Disposition: pt stable to PACU

## 2017-05-07 NOTE — Progress Notes (Addendum)
PROGRESS NOTE  William [name]  RKY:[id number] DOB: 1934-04-30 DOA: 05/06/2017 PCP: Jani Gravel, MD  Outpatient Specialists: Neurology, Dr. Leonie Man; Sleep Medicine Dr. Brett Fairy, Hematology, Dr. Beryle Beams; GI, Dr. Ardis Hughs  Brief Narrative: William [name] is an 83 y.o. male with a history of AFib on eliquis, CVA in Nov 2017, HTN, IDDM, prostate CA, and OA s/p bilateral TKA who was brought to the ED by his family  on 6/9 due to progressive generalized weakness, worsening in the past few weeks, leading to recurrent falls. Labwork revealed hyperkalemia (5.6) associated with acute renal failure, creatinine 5.29, up from 2.1 last month, BUN 89, and leukocytosis (15.6k) with evidence of UTI. Renal U/S demonstrated bilateral hydronephrosis and urology was consulted, placed ureteral stents 6/10.   Assessment & Plan: Principal Problem:   Acute lower UTI Active Problems:   IDDM (insulin dependent diabetes mellitus) ([place])   Prostate cancer (HCC)   Sleep apnea   Acute renal failure (ARF) (HCC)   Occult GI bleeding  Acute renal failure: Suspect chronic element to kidney disease with HTN, DM, but acutely worsened due to postrenal obstruction. FENa 0.68%. No uremic symptoms at this time. - Ureteral stents placed, urethral catheter placed, so continue IVF's. Will be judicious with G2DD on last echo. - Repeat kayexalate for mild hyperkalemia without ECG changes.  - Strict I/O, monitor UOP - Continue to hold lisinopril and metformin  - Hold eliquis while GFR is so impaired - Monitor BMP this PM then in AM. Discussed w/Dr. Jonnie Finner: If not improving, would seek nephrology input 6/11  Bilateral hydronephrosis: Bilateral L (known) > R (new) hydronephrosis seen on [place]. Not clear etiology ?post-radiation fibrosis - Urology consulted, cystoscopy, ureteral stents 6/10. Op note states bladder was edematous, neovascularized, bleeding easily with extremely limited capacity (~30cc), explaining nocturia/frequency and  hematuria.   UTI: - Monitor urine culture (50k colonies of unidentified organism?) and blood cultures drawn at admission - Continue ceftriaxone  Weakness, falls: No syncope. Suspect due subacutely to deconditioning post-CVA, with low grade uremia contributing and acutely worsened by infection. TSH wnl. - Pt consult - Treat conditions as above  History of prostate CA s/p IMRT Sep 2012: With increased LUTS, UTI, unintentional weight loss and reported painless hematuria.  - Repeat PSA pending (<0.06 May 2016)  Occult GI bleeding: +FOBT in the setting of NOAC + renal failure. Fortunately no anemia on admission. With h/o radiation proctitis, last Cscope by [facility] reportedly negative, no f/u recommended at that time.  - Discussed risks/benefits of holding anticoagulation in this setting, will give heparin for now.  - No indication for urgent work up. Will follow up with Dr. Ardis Hughs  IDT2DM: HbA1c 6.8% in 2017, controlled for age. - Hold levemir for now with poor po/NPO for surgery, and renal failure.  - SSI, titrate long-acting as needed  Atrial flutter: Rate controlled - Continue anticoagulation with heparin for now, restart eliquis once GFR improves. - Not on rate-controlling medications. Note ECGs prior to A flutter onset had prolonged PR interval, so if rate remains controlled, would prefer to avoid BB/CCBs.   Essential HTN: Mildly elevated in ED - Holding lisinopril and thiazide diuretic due to renal failure - Hydralazine prn ordered for severe BP elevations  M-spike: Reported, I do not see that result. 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 No anemia and normal Ca reassuring.  - Hematology referral to Dr. Beryle Beams as outpatient in progress. Family asked if we could facilitate this. Told them we could consider calling  at discharge, but this is not urgent.  OSA: Does not tolerate PAP qHS, so this will not be ordered.  DVT prophylaxis: IV heparin Code Status: Full Family  Communication: Discussed with wife and 3 daughters at bedside. Disposition Plan: Anticipate home once improved.   Consultants:   Urology  Discussed with nephrology, Dr. Jonnie Finner, by phone 6/10, no formal consult.  Procedures:   Cystoscopy and ureteral stents placement 05/07/2017 by Dr. Junious Silk  Antimicrobials:  Ceftriaxone 6/9 >>    Subjective: Pt tired after procedure, denies pain. Interviewed in PACU.  Objective: Vitals:   05/06/17 1800 05/06/17 2014 05/07/17 0445 05/07/17 0457  BP: (!) 148/72 (!) 151/89 (!) 147/72   Pulse: 68 72 79   Resp: (!) 36 (!) 21 19   Temp:  97.7 F (36.5 C) 98.6 F (37 C)   TempSrc:  Oral Oral   SpO2: 99% 99% 97%   Weight:  93.2 kg (205 lb 7.5 oz)  93.2 kg (205 lb 7.5 oz)  Height:  6\' 1"  (1.854 m)      Intake/Output Summary (Last 24 hours) at 05/07/17 1300 Last data filed at 05/07/17 0900  Gross per 24 hour  Intake          1281.42 ml  Output               50 ml  Net          1231.42 ml   Filed Weights   05/06/17 2014 05/07/17 0457  Weight: 93.2 kg (205 lb 7.5 oz) 93.2 kg (205 lb 7.5 oz)   Examination: General exam: 83 y.o. male in no distress Respiratory system: Non-labored breathing room air. Clear to auscultation bilaterally.  Cardiovascular system: Irreg. No murmur, rub, or gallop. No JVD, and no pedal edema. Gastrointestinal system: Abdomen soft, non-tender, non-distended, with normoactive bowel sounds. No organomegaly or masses felt. GU: Urethral catheter in situ, draining light red urine. No urethral abrasion/bleeding. Central nervous system: Alert, oriented. No focal neurological deficits. Extremities: Warm, no deformities Skin: Scattered SebK's. Ecchymosis on right AC fossa, faint ecchymosis on left flank and bilateral abdomen. Superficial transverse laceration at hairline on central/left forehead/scalp, hemostatic without erythema, well-apposed. Psychiatry: Judgement and insight appear intact. Mood & affect appropriate.    Data Reviewed: I have personally reviewed following labs and imaging studies  CBC:  Recent Labs Lab 05/06/17 1224 05/07/17 0403  WBC 15.6* 12.6*  HGB 13.7 11.7*  HCT 40.3 34.5*  MCV 90.0 89.4  PLT 243 563   Basic Metabolic Panel:  Recent Labs Lab 05/06/17 1224 05/06/17 2040 05/07/17 0403  NA 133* 133* 136  K 5.6* 5.2* 5.4*  CL 103 105 106  CO2 19* 16* 20*  GLUCOSE 92 118* 155*  BUN 89* 90* 92*  CREATININE 5.29* 5.00* 5.01*  CALCIUM 9.1 8.8* 8.8*   GFR: Estimated Creatinine Clearance: 13.3 mL/min (A) (by C-G formula based on SCr of 5.01 mg/dL (H)). Liver Function Tests: No results for input(s): AST, ALT, ALKPHOS, BILITOT, PROT, ALBUMIN in the last 168 hours. No results for input(s): LIPASE, AMYLASE in the last 168 hours. No results for input(s): AMMONIA in the last 168 hours. Coagulation Profile: No results for input(s): INR, PROTIME in the last 168 hours. Cardiac Enzymes: No results for input(s): CKTOTAL, CKMB, CKMBINDEX, TROPONINI in the last 168 hours. BNP (last 3 results) No results for input(s): PROBNP in the last 8760 hours. HbA1C: No results for input(s): HGBA1C in the last 72 hours. CBG:  Recent Labs Lab 05/06/17  1548 05/06/17 2013 05/07/17 0811 05/07/17 1043  GLUCAP 119* 107* 148* 176*   Lipid Profile: No results for input(s): CHOL, HDL, LDLCALC, TRIG, CHOLHDL, LDLDIRECT in the last 72 hours. Thyroid Function Tests:  Recent Labs  05/06/17 2040  TSH 4.424   Anemia Panel: No results for input(s): VITAMINB12, FOLATE, FERRITIN, TIBC, IRON, RETICCTPCT in the last 72 hours. Urine analysis:    Component Value Date/Time   COLORURINE YELLOW 05/06/2017 1612   APPEARANCEUR CLOUDY (A) 05/06/2017 1612   LABSPEC 1.012 05/06/2017 1612   PHURINE 5.0 05/06/2017 1612   GLUCOSEU NEGATIVE 05/06/2017 1612   HGBUR LARGE (A) 05/06/2017 1612   BILIRUBINUR NEGATIVE 05/06/2017 1612   KETONESUR NEGATIVE 05/06/2017 1612   PROTEINUR NEGATIVE 05/06/2017 1612    UROBILINOGEN 1.0 04/29/2014 1033   NITRITE NEGATIVE 05/06/2017 1612   LEUKOCYTESUR MODERATE (A) 05/06/2017 1612   Recent Results (from the past 240 hour(s))  Surgical pcr screen     Status: None   Collection Time: 05/07/17 10:31 AM  Result Value Ref Range Status   MRSA, PCR NEGATIVE NEGATIVE Final   Staphylococcus aureus NEGATIVE NEGATIVE Final    Comment:        The Xpert SA Assay (FDA approved for NASAL specimens in patients over 92 years of age), is one component of a comprehensive surveillance program.  Test performance has been validated by [facility] for patients greater than or equal to 37 year old. It is not intended to diagnose infection nor to guide or monitor treatment.       Radiology Studies: US Renal  Result Date: 05/06/2017 CLINICAL DATA:  Acute renal failure EXAM: RENAL / URINARY TRACT ULTRASOUND COMPLETE COMPARISON:  CT abdomen/ pelvis dated 04/14/2017 FINDINGS: Right Kidney: Length: 13.8 cm.  Moderate right hydronephrosis, new. Left Kidney: Length: 14.8 cm.  Moderate left hydronephrosis, unchanged Bladder: Not discretely visualized. Additional comments:  Right pleural effusion.  Ascites. IMPRESSION: Moderate left hydronephrosis, unchanged from prior CT. Moderate right hydronephrosis, new. Bladder is not discretely visualized. Electronically Signed   By: Julian Hy M.D.   On: 05/06/2017 18:56    Scheduled Meds: . [MAR Hold] insulin aspart  0-9 Units Subcutaneous TID WC  . [MAR Hold] mouth rinse  15 mL Mouth Rinse BID  . [MAR Hold] pravastatin  40 mg Oral Q supper  . [MAR Hold] sodium chloride flush  3 mL Intravenous Q12H  . [MAR Hold] sodium polystyrene  45 g Oral Once   Continuous Infusions: . [MAR Hold] cefTRIAXone (ROCEPHIN)  IV Stopped (05/06/17 2214)  . heparin 1,150 Units/hr (05/07/17 0729)     LOS: 1 day   Time spent: 25 minutes.  Vance Gather, MD [facility] Pager [phone]  If 7PM-7AM, please contact  night-coverage [email or web address] Password [email or web address] 05/07/2017, 1:00 PM

## 2017-05-07 NOTE — OR Nursing (Signed)
Pt arrived to pacu from OR on a bed.  Pt is moaning and not answering questions at this time.  Fentanyl given for perceived pain by R. Flowers Immunologist.  Reportedly Heparin infusion was stopped for surgery and is currently off on arrival to pacu.  Handoff report received.

## 2017-05-07 NOTE — Anesthesia Procedure Notes (Signed)
Procedure Name: LMA Insertion Date/Time: 05/07/2017 1:10 PM Performed by: Raphael Gibney T Pre-anesthesia Checklist: Patient identified, Emergency Drugs available, Suction available, Patient being monitored and Timeout performed Patient Re-evaluated:Patient Re-evaluated prior to inductionOxygen Delivery Method: Circle system utilized and Simple face mask Preoxygenation: Pre-oxygenation with 100% oxygen Intubation Type: IV induction Ventilation: Mask ventilation without difficulty LMA: LMA inserted LMA Size: 5.0 Number of attempts: 1 Airway Equipment and Method: Patient positioned with wedge pillow Placement Confirmation: positive ETCO2 and breath sounds checked- equal and bilateral Tube secured with: Tape Dental Injury: Teeth and Oropharynx as per pre-operative assessment

## 2017-05-08 ENCOUNTER — Encounter (HOSPITAL_COMMUNITY): Payer: Self-pay | Admitting: Urology

## 2017-05-08 ENCOUNTER — Inpatient Hospital Stay (HOSPITAL_COMMUNITY): Payer: Medicare Other

## 2017-05-08 LAB — URINE CULTURE

## 2017-05-08 LAB — CBC
HCT: 35.7 % — ABNORMAL LOW (ref 39.0–52.0)
HEMOGLOBIN: 11.8 g/dL — AB (ref 13.0–17.0)
MCH: 29.9 pg (ref 26.0–34.0)
MCHC: 33.1 g/dL (ref 30.0–36.0)
MCV: 90.6 fL (ref 78.0–100.0)
Platelets: 202 10*3/uL (ref 150–400)
RBC: 3.94 MIL/uL — AB (ref 4.22–5.81)
RDW: 14.6 % (ref 11.5–15.5)
WBC: 11.9 10*3/uL — AB (ref 4.0–10.5)

## 2017-05-08 LAB — GLUCOSE, CAPILLARY
GLUCOSE-CAPILLARY: 162 mg/dL — AB (ref 65–99)
GLUCOSE-CAPILLARY: 173 mg/dL — AB (ref 65–99)
GLUCOSE-CAPILLARY: 193 mg/dL — AB (ref 65–99)
GLUCOSE-CAPILLARY: 169 mg/dL — AB (ref 65–99)
Glucose-Capillary: 158 mg/dL — ABNORMAL HIGH (ref 65–99)

## 2017-05-08 LAB — BASIC METABOLIC PANEL
Anion gap: 9 (ref 5–15)
BUN: 86 mg/dL — ABNORMAL HIGH (ref 6–20)
CALCIUM: 8.5 mg/dL — AB (ref 8.9–10.3)
CHLORIDE: 112 mmol/L — AB (ref 101–111)
CO2: 18 mmol/L — AB (ref 22–32)
CREATININE: 3.83 mg/dL — AB (ref 0.61–1.24)
GFR calc non Af Amer: 14 mL/min — ABNORMAL LOW (ref >60)
GFR, EST AFRICAN AMERICAN: 16 mL/min — AB (ref >60)
GLUCOSE: 163 mg/dL — AB (ref 65–99)
Potassium: 4.8 mmol/L (ref 3.5–5.1)
Sodium: 139 mmol/L (ref 135–145)

## 2017-05-08 LAB — APTT
aPTT: 80 seconds — ABNORMAL HIGH (ref 24–36)
aPTT: 77 seconds — ABNORMAL HIGH (ref 24–36)

## 2017-05-08 LAB — HEPARIN LEVEL (UNFRACTIONATED): HEPARIN UNFRACTIONATED: 0.95 [IU]/mL — AB (ref 0.30–0.70)

## 2017-05-08 MED ORDER — HEPARIN (PORCINE) IN NACL 100-0.45 UNIT/ML-% IJ SOLN
1150.0000 [IU]/h | INTRAMUSCULAR | Status: DC
  Administered 2017-05-08: 1150 [IU]/h via INTRAVENOUS

## 2017-05-08 MED ORDER — DOXYCYCLINE HYCLATE 100 MG PO TABS
100.0000 mg | ORAL_TABLET | Freq: Two times a day (BID) | ORAL | Status: DC
  Administered 2017-05-08 – 2017-05-12 (×10): 100 mg via ORAL
  Filled 2017-05-08 (×10): qty 1

## 2017-05-08 MED ORDER — SODIUM BICARBONATE 650 MG PO TABS
1300.0000 mg | ORAL_TABLET | Freq: Two times a day (BID) | ORAL | Status: DC
  Administered 2017-05-08 (×2): 1300 mg via ORAL
  Filled 2017-05-08 (×2): qty 2

## 2017-05-08 MED ORDER — FENTANYL CITRATE (PF) 100 MCG/2ML IJ SOLN: 25.0000 ug | INTRAMUSCULAR | Status: DC | PRN

## 2017-05-08 NOTE — Progress Notes (Signed)
PT Cancellation Note  Patient Details Name: William [name] MRN: [id number] DOB: 1934-05-11   Cancelled Treatment:    Reason Eval/Treat Not Completed: Patient at procedure or test/unavailable   Will follow up later today as time allows;  Otherwise, will follow up for PT tomorrow;   Thank you,  Roney Marion, PT  Acute Rehabilitation Services Pager [phone] Office [place] 05/08/2017, 12:00 PM

## 2017-05-08 NOTE — Progress Notes (Signed)
Rehab Admissions Coordinator Note:  Patient was screened by Cleatrice Burke for appropriateness for an Inpatient Acute Rehab Consult per PT recommendation.   At this time, we are recommending Inpatient Rehab consult if pt would like to be considered for an admission to inpt rehab. Please advise.  Cleatrice Burke 05/08/2017, 7:20 PM  I can be reached at [phone].

## 2017-05-08 NOTE — Progress Notes (Addendum)
ANTICOAGULATION CONSULT NOTE - Follow Up Consult  Pharmacy Consult for heparin (apixaban on hold) Indication: atrial fibrillation  Allergies  Allergen Reactions  . Glyburide Other (See Comments)    Hypoglycemia     Patient Measurements: Height: 6\' 1"  (185.4 cm) Weight: 202 lb 9.6 oz (91.9 kg) IBW/kg (Calculated) : 79.9  Vital Signs: Temp: 97.4 F (36.3 C) (06/11 0911) Temp Source: Oral (06/11 0911) BP: 145/75 (06/11 0911) Pulse Rate: 75 (06/11 0911)  Labs:  Recent Labs  05/06/17 1224  05/07/17 0403 05/07/17 0404 05/07/17 1524 05/07/17 2038 05/08/17 0142 05/08/17 0442  HGB 13.7  --  11.7*  --   --   --   --  11.8*  HCT 40.3  --  34.5*  --   --   --   --  35.7*  PLT 243  --  217  --   --   --   --  202  APTT  --   --  111*  --  41*  --  80*  --   HEPARINUNFRC  --   --   --  1.78* 1.27*  --  0.95*  --   CREATININE 5.29*  < > 5.01*  --   --  3.35*  --  3.83*  < > = values in this interval not displayed.  Estimated Creatinine Clearance: 17.4 mL/min (A) (by C-G formula based on SCr of 3.83 mg/dL (H)).   Assessment: 83 y/o M on heparin drip for Afib while apixaban on hold. He is now s/p bilateral ureteral stents for hydronephrosis and AKI.   Heparin level is elevated at 0.95, PTT is therapeutic at 80 - they are not correlating yet so will continue to monitor PTT. Hematuria noted but expected with stent and foley per urology, Hgb stable in 11's, platelets are normal.  Goal of Therapy:  Heparin level 0.3-0.7 units/ml aPTT 66-102 seconds Monitor platelets by anticoagulation protocol: Yes   Plan:  - Continue heparin drip at 1150 units/hr - Confirmatory PTT at 12:00 - Daily heparin level, PTT, CBC - Monitor for s/sx of bleeding   Renold Genta, PharmD, BCPS Clinical Pharmacist Phone for today - [facility] - [phone] 05/08/2017 11:15 AM    Addendum: Confirmatory PTT is 77 and therapeutic.  Continue heparin drip at 1150 units/hr  [facility],  PharmD, BCPS Clinical Pharmacist 05/08/2017 3:42 PM

## 2017-05-08 NOTE — Progress Notes (Signed)
PROGRESS NOTE    William BIERNAT  VWP:[id number] DOB: 23-Aug-1934 DOA: 05/06/2017 PCP: Jani Gravel, MD     Brief Narrative:  William Quevedo [name] an 83 y.o.malewith a history of AFib on eliquis, CVA in Nov 2017, HTN, IDDM, prostate CA, andOA s/p bilateral TKA who was brought to the ED by his family on 6/9 due to progressive generalized weakness, worsening in the past few weeks, leading to recurrent falls. Workup revealed acute kidney injury, leukocytosis with evidence of UTI, bilateral hydronephrosis on renal ultrasound. Urology was consulted and patient underwent ureteral stent placement on 6/10.   Assessment & Plan:   Principal Problem:   Acute lower UTI Active Problems:   IDDM (insulin dependent diabetes mellitus) ([place])   Prostate cancer (HCC)   Sleep apnea   Acute renal failure (ARF) (HCC)   Occult GI bleeding  Acute renal failure with bilateral hydronephrosis and UTI: Suspect chronic element to kidney disease with HTN, DM, but acutely worsened due to postrenal obstruction. FENa 0.68%. No uremic symptoms [name] this time. - Ureteral stents placed 6/10. Op note states bladder was edematous, neovascularized, bleeding easily with extremely limited capacity (~30cc), explaining nocturia/frequency and hematuria.  - Repeat renal US today with persistent moderate bilateral hydronephrosis, right ureteral stent not well visualized - defer to Urology team for further course of care  - Cr with initial improvement after procedure then worsening today, consult Nephrology   Coag negative staph UTI - Blood cultures pending - Switch to Vanco as patient resistant to multiple organisms, with renal failure   Weakness, falls: No syncope. Suspect due subacutely to deconditioning post-CVA, with low grade uremia contributing and acutely worsened by infection. TSH wnl. - PT consult  History of prostate CA s/p IMRT Sep 2012: With increased LUTS, UTI, unintentional weight loss and reported painless hematuria.    - Repeat PSA pending (<0.06 May 2016)  Occult GI bleeding: +FOBT in the setting of NOAC + renal failure. Fortunately no anemia on admission. With h/o radiation proctitis, last colonoscopy by [facility] reportedly negative, no f/u recommended [name] that time.  - Discussed risks/benefits of holding anticoagulation in this setting, will give heparin for now.  - No indication for urgent work up. Will follow up with Dr. Ardis Hughs  IDT2DM: HbA1c 6.8% in 2017, controlled for age. - Hold levemir for now with poor po - SSI, titrate long-acting as needed  Atrial flutter - Continue anticoagulation with heparin for now, restart eliquis once GFR improves. - Currently NSR   Essential HTN - Holding lisinopril and thiazide diuretic due to renal failure - Hydralazine prn ordered for severe BP elevations  M-spike: Reported - Hematology referral to Dr. Beryle Beams as outpatient in progress  OSA: - Does not tolerate PAP qHS   DVT prophylaxis: IV heparin, takes eliquis as outpatient for A Fib Code Status: Full Family Communication: Family [name] bedside Disposition Plan: Pending improvement   Consultants:   Urology Dr. Junious Silk  Nephrology   Procedures:   Cystoscopy and ureteral stents placement 05/07/2017 by Dr. Junious Silk  Antimicrobials:   Ceftriaxone 6/9 >>    Subjective: Patient has no appetite but otherwise no complaints. He denies any pain, headaches, fevers, chest pain, shortness of breath, nausea, vomiting, abdominal pain. Foley in place draining bloody urine.  Objective: Vitals:   05/07/17 2101 05/08/17 0457 05/08/17 0500 05/08/17 0911  BP: 139/70 (!) 158/67  (!) 145/75  Pulse: 69 67  75  Resp: 17 16  17   Temp: 97.8 F (36.6 C) 98 F (  36.7 C)  97.4 F (36.3 C)  TempSrc:    Oral  SpO2: 100% 97%  97%  Weight: 91.9 kg (202 lb 8 oz)  91.9 kg (202 lb 9.6 oz)   Height:        Intake/Output Summary (Last 24 hours) [name] 05/08/17 1410 Last data filed [name] 05/08/17 1300  Gross  per 24 hour  Intake          2758.42 ml  Output             1850 ml  Net           908.42 ml   Filed Weights   05/07/17 0457 05/07/17 2101 05/08/17 0500  Weight: 93.2 kg (205 lb 7.5 oz) 91.9 kg (202 lb 8 oz) 91.9 kg (202 lb 9.6 oz)    Examination:  General exam: Appears calm and comfortable  Respiratory system: Clear to auscultation. Respiratory effort normal. Cardiovascular system: S1 & S2 heard, RRR. No JVD, murmurs, rubs, gallops or clicks. No pedal edema. Gastrointestinal system: Abdomen is nondistended, soft and nontender. No organomegaly or masses felt. Normal bowel sounds heard. Central nervous system: Alert and oriented. No focal neurological deficits. Extremities: Symmetric 5 x 5 power. Skin: No rashes, lesions or ulcers Psychiatry: Judgement and insight appear normal. Mood & affect appropriate.   Data Reviewed: I have personally reviewed following labs and imaging studies  CBC:  Recent Labs Lab 05/06/17 1224 05/07/17 0403 05/08/17 0442  WBC 15.6* 12.6* 11.9*  HGB 13.7 11.7* 11.8*  HCT 40.3 34.5* 35.7*  MCV 90.0 89.4 90.6  PLT 243 217 440   Basic Metabolic Panel:  Recent Labs Lab 05/06/17 1224 05/06/17 2040 05/07/17 0403 05/07/17 2038 05/08/17 0442  NA 133* 133* 136 140 139  K 5.6* 5.2* 5.4* 3.9 4.8  CL 103 105 106 116* 112*  CO2 19* 16* 20* 17* 18*  GLUCOSE 92 118* 155* 177* 163*  BUN 89* 90* 92* 74* 86*  CREATININE 5.29* 5.00* 5.01* 3.35* 3.83*  CALCIUM 9.1 8.8* 8.8* 6.8* 8.5*   GFR: Estimated Creatinine Clearance: 17.4 mL/min (A) (by C-G formula based on SCr of 3.83 mg/dL (H)). Liver Function Tests: No results for input(s): AST, ALT, ALKPHOS, BILITOT, PROT, ALBUMIN in the last 168 hours. No results for input(s): LIPASE, AMYLASE in the last 168 hours. No results for input(s): AMMONIA in the last 168 hours. Coagulation Profile: No results for input(s): INR, PROTIME in the last 168 hours. Cardiac Enzymes: No results for input(s): CKTOTAL, CKMB,  CKMBINDEX, TROPONINI in the last 168 hours. BNP (last 3 results) No results for input(s): PROBNP in the last 8760 hours. HbA1C: No results for input(s): HGBA1C in the last 72 hours. CBG:  Recent Labs Lab 05/07/17 1655 05/07/17 2049 05/08/17 0825 05/08/17 1252 05/08/17 1315  GLUCAP 165* 199* 162* 193* 173*   Lipid Profile: No results for input(s): CHOL, HDL, LDLCALC, TRIG, CHOLHDL, LDLDIRECT in the last 72 hours. Thyroid Function Tests:  Recent Labs  05/06/17 2040  TSH 4.424   Anemia Panel: No results for input(s): VITAMINB12, FOLATE, FERRITIN, TIBC, IRON, RETICCTPCT in the last 72 hours. Sepsis Labs: No results for input(s): PROCALCITON, LATICACIDVEN in the last 168 hours.  Recent Results (from the past 240 hour(s))  Urine Culture     Status: Abnormal   Collection Time: 05/06/17  4:43 PM  Result Value Ref Range Status   Specimen Description URINE, RANDOM  Final   Special Requests NONE  Final   Culture (A)  Final  50,000 COLONIES/mL STAPHYLOCOCCUS SPECIES (COAGULASE NEGATIVE)   Report Status 05/08/2017 FINAL  Final   Organism ID, Bacteria STAPHYLOCOCCUS SPECIES (COAGULASE NEGATIVE) (A)  Final      Susceptibility   Staphylococcus species (coagulase negative) - MIC*    CIPROFLOXACIN >=8 RESISTANT Resistant     GENTAMICIN <=0.5 SENSITIVE Sensitive     NITROFURANTOIN <=16 SENSITIVE Sensitive     OXACILLIN >=4 RESISTANT Resistant     TETRACYCLINE <=1 SENSITIVE Sensitive     VANCOMYCIN <=0.5 SENSITIVE Sensitive     TRIMETH/SULFA <=10 SENSITIVE Sensitive     CLINDAMYCIN >=8 RESISTANT Resistant     RIFAMPIN <=0.5 SENSITIVE Sensitive     Inducible Clindamycin NEGATIVE Sensitive     * 50,000 COLONIES/mL STAPHYLOCOCCUS SPECIES (COAGULASE NEGATIVE)  Culture, blood (routine x 2)     Status: None (Preliminary result)   Collection Time: 05/06/17  4:53 PM  Result Value Ref Range Status   Specimen Description BLOOD RIGHT FOREARM  Final   Special Requests   Final    BOTTLES  DRAWN AEROBIC AND ANAEROBIC Blood Culture adequate volume   Culture NO GROWTH < 24 HOURS  Final   Report Status PENDING  Incomplete  Culture, blood (routine x 2)     Status: None (Preliminary result)   Collection Time: 05/06/17  5:10 PM  Result Value Ref Range Status   Specimen Description BLOOD LEFT ANTECUBITAL  Final   Special Requests   Final    BOTTLES DRAWN AEROBIC AND ANAEROBIC Blood Culture adequate volume   Culture NO GROWTH < 24 HOURS  Final   Report Status PENDING  Incomplete  Surgical pcr screen     Status: None   Collection Time: 05/07/17 10:31 AM  Result Value Ref Range Status   MRSA, PCR NEGATIVE NEGATIVE Final   Staphylococcus aureus NEGATIVE NEGATIVE Final    Comment:        The Xpert SA Assay (FDA approved for NASAL specimens in patients over 33 years of age), is one component of a comprehensive surveillance program.  Test performance has been validated by [facility] for patients greater than or equal to 42 year old. It is not intended to diagnose infection nor to guide or monitor treatment.        Radiology Studies: Dg Retrograde Pyelogram  Result Date: 05/07/2017 CLINICAL DATA:  Nephrolithiasis EXAM: RETROGRADE PYELOGRAM; DG C-ARM 61-120 MIN COMPARISON:  CT abdomen and pelvis Apr 14, 2017 FLUOROSCOPY TIME:  2 minutes 38 seconds; 7 acquired images FINDINGS: Retrograde filling of both ureters and collecting systems was performed intraoperatively. On the right, there is dilatation of the distal right ureter with an apparent stricture in the ureter [name] the mid sacral level. There is a rather abrupt band in the proximal right ureter just distal to the right ureteropelvic junction. No calculus is seen on this study on the right. On the left, there is a filling defect in the proximal left ureter consistent with a calculus. There is also a calculus in a lower pole calyx on the left. There is fullness of the left renal pelvis. Double-J stents were subsequently placed  bilaterally. IMPRESSION: Calculi in the proximal left ureter in lower pole left renal calyx with double-J stent subsequently placed on the left. On the right, apparent stricture in the right ureter [name] the mid sacral level. Abrupt turn with possible kink in the proximal right ureter with double-J stent placed on the right. Electronically Signed   By: Lowella Grip III M.D.   On:  05/07/2017 15:14   US Renal  Result Date: 05/08/2017 CLINICAL DATA:  Hydronephrosis.  History of prostate cancer. EXAM: RENAL / URINARY TRACT ULTRASOUND COMPLETE COMPARISON:  Ultrasound of May 06, 2017. FINDINGS: Right Kidney: Length: 12.9 cm. Echogenicity within normal limits. Right ureteral stent is not well visualized. No mass visualized. Moderate hydronephrosis is noted. Left Kidney: Length: 14.3 cm. Echogenicity within normal limits. No mass visualized. Moderate hydronephrosis is noted. Left ureteral stent is noted. Bladder: Foley catheter is noted in urinary bladder. Bladder appears to be otherwise normal for degree of distension. IMPRESSION: Moderate bilateral hydronephrosis is noted. Left ureteral stent is noted. Right ureteral stent is not well visualized. Electronically Signed   By: Marijo Conception, M.D.   On: 05/08/2017 12:29   US Renal  Result Date: 05/06/2017 CLINICAL DATA:  Acute renal failure EXAM: RENAL / URINARY TRACT ULTRASOUND COMPLETE COMPARISON:  CT abdomen/ pelvis dated 04/14/2017 FINDINGS: Right Kidney: Length: 13.8 cm.  Moderate right hydronephrosis, new. Left Kidney: Length: 14.8 cm.  Moderate left hydronephrosis, unchanged Bladder: Not discretely visualized. Additional comments:  Right pleural effusion.  Ascites. IMPRESSION: Moderate left hydronephrosis, unchanged from prior CT. Moderate right hydronephrosis, new. Bladder is not discretely visualized. Electronically Signed   By: Julian Hy M.D.   On: 05/06/2017 18:56   Dg C-arm 1-60 Min  Result Date: 05/07/2017 CLINICAL DATA:  Nephrolithiasis  EXAM: RETROGRADE PYELOGRAM; DG C-ARM 61-120 MIN COMPARISON:  CT abdomen and pelvis Apr 14, 2017 FLUOROSCOPY TIME:  2 minutes 38 seconds; 7 acquired images FINDINGS: Retrograde filling of both ureters and collecting systems was performed intraoperatively. On the right, there is dilatation of the distal right ureter with an apparent stricture in the ureter [name] the mid sacral level. There is a rather abrupt band in the proximal right ureter just distal to the right ureteropelvic junction. No calculus is seen on this study on the right. On the left, there is a filling defect in the proximal left ureter consistent with a calculus. There is also a calculus in a lower pole calyx on the left. There is fullness of the left renal pelvis. Double-J stents were subsequently placed bilaterally. IMPRESSION: Calculi in the proximal left ureter in lower pole left renal calyx with double-J stent subsequently placed on the left. On the right, apparent stricture in the right ureter [name] the mid sacral level. Abrupt turn with possible kink in the proximal right ureter with double-J stent placed on the right. Electronically Signed   By: Lowella Grip III M.D.   On: 05/07/2017 15:14      Scheduled Meds: . insulin aspart  0-9 Units Subcutaneous TID WC  . mouth rinse  15 mL Mouth Rinse BID  . pravastatin  40 mg Oral Q supper  . sodium bicarbonate  1,300 mg Oral BID  . sodium chloride flush  3 mL Intravenous Q12H  . sodium polystyrene  45 g Oral Once   Continuous Infusions: . sodium chloride 75 mL/hr [name] 05/08/17 0553  . heparin 1,150 Units/hr (05/07/17 2019)     LOS: 2 days    Time spent: 40 minutes   Dessa Phi, DO [facility] [email or web address] Password [email or web address] 05/08/2017, 2:10 PM

## 2017-05-08 NOTE — Progress Notes (Signed)
Stopped by to trouble shot catheter and ensure that it was not occluded.  UOP since 1pm (10 hours) has been ~250cc of dark bloody urine.  Catheter didn't irrigate very well, but I was able to irrigate with 30-40cc NS and didn't get back any clots.  I deflated his foley balloon and pushed his catheter in further which was not helpful.  There was only ~7cc of sterile water in his catheter.    I suspect that he remains obstructed from his distal/UVJ ureteral strictures - part of the process that has left him a bladder cripple that has extended into this ureters.  Bilateral neph tubes should be considered at this point if his creatinine does not improve by the morning.    I have made the patient NPO p MN.

## 2017-05-08 NOTE — Consult Note (Signed)
Reason for Consult:AKI  Referring Physician: Dr. Gust Rung is an 83 y.o. male.  HPI: 83 yr male with hx DM and HTN both over 26 yr, Prostate Ca 4 yr ago tx with Radrx, Afib 2 yr OSA, ^ lipids, DJD with 2 TKRs, CVA 10/17. Now admitted with 2 mon of FTT, 40 lb wgt loss, 7-10 d of severe weakness, falls, SOB, poor intake, lethargy, admitted with Cr of 5.3 and found to have bilat hydronephrosis, secondary to occlusion close to bladder.  Has very small bladder also (sx of severe frequency).  Cr in 11/17 was  .9 and 04/06/17 2.1.   Has been on Lisinopril, metformen and occ NSAID.   Cr after stents placed by Drl Eskridge, went down to 5 then 3.35, but today up to 3.85.  This correlates with reduced urine outuput last 12 h.        He is poor historian.  Daughter relates itching, SOB with minimal exertion.   Constitutional: as above Eyes: negative Ears, nose, mouth, throat, and face: negative Respiratory: as above, no cough, or wheezing Cardiovascular: negative Gastrointestinal: negative Genitourinary:itching Integument/breast: as above Hematologic/lymphatic: m-spike in notes Musculoskeletal:DJD, 2 TKRs Neurological: CVA with dizziness Endocrine: DM Allergic/Immunologic: ? glyburide   Past Medical History:  Diagnosis Date  . Arthritis   . Diabetes mellitus without complication ([place])   . Hypercholesteremia   . Hypertension   . Prostate cancer [facility])    with radiation treatment  . Sleep apnea 2/14   "mild per patient" hasnt gotten CPAP machine  . Stroke [facility])     Past Surgical History:  Procedure Laterality Date  . ADENOIDECTOMY    . CYSTOSCOPY W/ URETERAL STENT PLACEMENT Bilateral 05/07/2017   Procedure: CYSTOSCOPY WITH BILATERAL RETROGRADE PYELOGRAM CYSTOGRAM BILATERAL URETERAL STENT PLACEMENT;  Surgeon: Festus Aloe, MD;  Location: [facility];  Service: Urology;  Laterality: Bilateral;  . FLEXIBLE SIGMOIDOSCOPY N/A 09/12/2013   Procedure: FLEXIBLE SIGMOIDOSCOPY;  Surgeon:  Milus Banister, MD;  Location: [facility];  Service: Endoscopy;  Laterality: N/A;  . HERNIA REPAIR    . HOT HEMOSTASIS N/A 09/12/2013   Procedure: HOT HEMOSTASIS (ARGON PLASMA COAGULATION/BICAP);  Surgeon: Milus Banister, MD;  Location: [facility];  Service: Endoscopy;  Laterality: N/A;  . LIPOMA EXCISION    . OTHER SURGICAL HISTORY     s/p prostate radiation  . TONSILLECTOMY    . TOTAL KNEE ARTHROPLASTY Left 03/25/2014   Procedure: LEFT TOTAL KNEE ARTHROPLASTY;  Surgeon: [name], MD;  Location: [facility];  Service: Orthopedics;  Laterality: Left;  . TOTAL KNEE ARTHROPLASTY Right 05/06/2014   Procedure: RIGHT TOTAL KNEE ARTHROPLASTY;  Surgeon: [name], MD;  Location: [facility];  Service: Orthopedics;  Laterality: Right;    Family History  Problem Relation Age of Onset  . Colon cancer Father   . Stroke Maternal Grandmother   . Esophageal cancer Neg Hx   . Rectal cancer Neg Hx   . Stomach cancer Neg Hx     Social History:  reports that he has quit smoking. He started smoking about 34 years ago. He has never used smokeless tobacco. He reports that he drinks about 0.6 oz of alcohol per week . He reports that he does not use drugs.  Allergies:  Allergies  Allergen Reactions  . Glyburide Other (See Comments)    Hypoglycemia     Medications:  I have reviewed the patient's current medications. Prior to Admission:  Prescriptions Prior to Admission  Medication  Sig Dispense Refill Last Dose  . acetaminophen (TYLENOL) 325 MG tablet Take 325-650 mg by mouth every 6 (six) hours as needed for mild pain.   PRN at PRN  . amoxicillin (AMOXIL) 500 MG capsule Take 2,000 mg by mouth See admin instructions. ONE HOUR PRIOR TO DENTAL PROCEDURES   Past Week at Unknown time  . apixaban (ELIQUIS) 5 MG TABS tablet Take 1 tablet (5 mg total) by mouth 2 (two) times daily. 30 tablet 0 05/06/2017 at 0800  . Coenzyme Q10 (CO Q-10) 300 MG CAPS Take 300 mg by mouth daily with breakfast.   05/06/2017 at  am  . Glucosamine-Chondroit-Vit C-Mn (GLUCOSAMINE-CHONDROITIN) TABS Take 1 tablet by mouth 2 (two) times daily.   05/06/2017 at am  . HUMALOG KWIKPEN 100 UNIT/ML KiwkPen Inject 6 Units into the skin 3 (three) times daily before meals.    05/06/2017 at am  . loperamide (IMODIUM A-D) 2 MG tablet Take 2 mg by mouth 4 (four) times daily as needed for diarrhea or loose stools.   PRN at PRN  . ONGLYZA 5 MG TABS tablet Take 5 mg by mouth every morning.    05/06/2017 at 0800  . pravastatin (PRAVACHOL) 40 MG tablet Take 40 mg by mouth daily with supper.    05/05/2017 at pm  . LEVEMIR FLEXTOUCH 100 UNIT/ML Pen Inject 16 Units into the skin at bedtime.    ON HOLD at ON HOLD  . lisinopril-hydrochlorothiazide (PRINZIDE,ZESTORETIC) 10-12.5 MG per tablet Take 1 tablet by mouth every morning.    ON HOLD at ON HOLD  . metFORMIN (GLUCOPHAGE) 1000 MG tablet Take 1,000 mg by mouth 2 (two) times daily with a meal.   ON HOLD at [place]    Results for orders placed or performed during the hospital encounter of 05/06/17 (from the past 48 hour(s))  CBG monitoring, ED     Status: Abnormal   Collection Time: 05/06/17  3:48 PM  Result Value Ref Range   Glucose-Capillary 119 (H) 65 - 99 mg/dL  Urinalysis, Routine w reflex microscopic     Status: Abnormal   Collection Time: 05/06/17  4:12 PM  Result Value Ref Range   Color, Urine YELLOW YELLOW   APPearance CLOUDY (A) CLEAR   Specific Gravity, Urine 1.012 1.005 - 1.030   pH 5.0 5.0 - 8.0   Glucose, UA NEGATIVE NEGATIVE mg/dL   Hgb urine dipstick LARGE (A) NEGATIVE   Bilirubin Urine NEGATIVE NEGATIVE   Ketones, ur NEGATIVE NEGATIVE mg/dL   Protein, ur NEGATIVE NEGATIVE mg/dL   Nitrite NEGATIVE NEGATIVE   Leukocytes, UA MODERATE (A) NEGATIVE   RBC / HPF TOO NUMEROUS TO COUNT 0 - 5 RBC/hpf   WBC, UA TOO NUMEROUS TO COUNT 0 - 5 WBC/hpf   Bacteria, UA RARE (A) NONE SEEN   Squamous Epithelial / LPF 0-5 (A) NONE SEEN   Mucous PRESENT   Sodium, urine, random     Status: None    Collection Time: 05/06/17  4:12 PM  Result Value Ref Range   Sodium, Ur 15 mmol/L  Protein / creatinine ratio, urine     Status: Abnormal   Collection Time: 05/06/17  4:12 PM  Result Value Ref Range   Creatinine, Urine 87.87 mg/dL   Total Protein, Urine 22 mg/dL    Comment: NO NORMAL RANGE ESTABLISHED FOR THIS TEST   Protein Creatinine Ratio 0.25 (H) 0.00 - 0.15 mg/mg[Cre]  POC occult blood, ED Provider will collect     Status: Abnormal  Collection Time: 05/06/17  4:22 PM  Result Value Ref Range   Fecal Occult Bld POSITIVE (A) NEGATIVE  Urine Culture     Status: Abnormal   Collection Time: 05/06/17  4:43 PM  Result Value Ref Range   Specimen Description URINE, RANDOM    Special Requests NONE    Culture (A)     50,000 COLONIES/mL STAPHYLOCOCCUS SPECIES (COAGULASE NEGATIVE)   Report Status 05/08/2017 FINAL    Organism ID, Bacteria STAPHYLOCOCCUS SPECIES (COAGULASE NEGATIVE) (A)       Susceptibility   Staphylococcus species (coagulase negative) - MIC*    CIPROFLOXACIN >=8 RESISTANT Resistant     GENTAMICIN <=0.5 SENSITIVE Sensitive     NITROFURANTOIN <=16 SENSITIVE Sensitive     OXACILLIN >=4 RESISTANT Resistant     TETRACYCLINE <=1 SENSITIVE Sensitive     VANCOMYCIN <=0.5 SENSITIVE Sensitive     TRIMETH/SULFA <=10 SENSITIVE Sensitive     CLINDAMYCIN >=8 RESISTANT Resistant     RIFAMPIN <=0.5 SENSITIVE Sensitive     Inducible Clindamycin NEGATIVE Sensitive     * 50,000 COLONIES/mL STAPHYLOCOCCUS SPECIES (COAGULASE NEGATIVE)  Culture, blood (routine x 2)     Status: None (Preliminary result)   Collection Time: 05/06/17  4:53 PM  Result Value Ref Range   Specimen Description BLOOD RIGHT FOREARM    Special Requests      BOTTLES DRAWN AEROBIC AND ANAEROBIC Blood Culture adequate volume   Culture NO GROWTH < 24 HOURS    Report Status PENDING   Culture, blood (routine x 2)     Status: None (Preliminary result)   Collection Time: 05/06/17  5:10 PM  Result Value Ref Range    Specimen Description BLOOD LEFT ANTECUBITAL    Special Requests      BOTTLES DRAWN AEROBIC AND ANAEROBIC Blood Culture adequate volume   Culture NO GROWTH < 24 HOURS    Report Status PENDING   Glucose, capillary     Status: Abnormal   Collection Time: 05/06/17  8:13 PM  Result Value Ref Range   Glucose-Capillary 107 (H) 65 - 99 mg/dL  TSH     Status: None   Collection Time: 05/06/17  8:40 PM  Result Value Ref Range   TSH 4.424 0.350 - 4.500 uIU/mL    Comment: Performed by a 3rd Generation assay with a functional sensitivity of <=0.01 uIU/mL.  Basic metabolic panel     Status: Abnormal   Collection Time: 05/06/17  8:40 PM  Result Value Ref Range   Sodium 133 (L) 135 - 145 mmol/L   Potassium 5.2 (H) 3.5 - 5.1 mmol/L   Chloride 105 101 - 111 mmol/L   CO2 16 (L) 22 - 32 mmol/L   Glucose, Bld 118 (H) 65 - 99 mg/dL   BUN 90 (H) 6 - 20 mg/dL   Creatinine, Ser 5.00 (H) 0.61 - 1.24 mg/dL   Calcium 8.8 (L) 8.9 - 10.3 mg/dL   GFR calc non Af Amer 10 (L) >60 mL/min   GFR calc Af Amer 11 (L) >60 mL/min    Comment: (NOTE) The eGFR has been calculated using the CKD EPI equation. This calculation has not been validated in all clinical situations. eGFR's persistently <60 mL/min signify possible Chronic Kidney Disease.    Anion gap 12 5 - 15  Basic metabolic panel     Status: Abnormal   Collection Time: 05/07/17  4:03 AM  Result Value Ref Range   Sodium 136 135 - 145 mmol/L   Potassium 5.4 (H)  3.5 - 5.1 mmol/L   Chloride 106 101 - 111 mmol/L   CO2 20 (L) 22 - 32 mmol/L   Glucose, Bld 155 (H) 65 - 99 mg/dL   BUN 92 (H) 6 - 20 mg/dL   Creatinine, Ser 5.01 (H) 0.61 - 1.24 mg/dL   Calcium 8.8 (L) 8.9 - 10.3 mg/dL   GFR calc non Af Amer 10 (L) >60 mL/min   GFR calc Af Amer 11 (L) >60 mL/min    Comment: (NOTE) The eGFR has been calculated using the CKD EPI equation. This calculation has not been validated in all clinical situations. eGFR's persistently <60 mL/min signify possible Chronic  Kidney Disease.    Anion gap 10 5 - 15  CBC     Status: Abnormal   Collection Time: 05/07/17  4:03 AM  Result Value Ref Range   WBC 12.6 (H) 4.0 - 10.5 K/uL   RBC 3.86 (L) 4.22 - 5.81 MIL/uL   Hemoglobin 11.7 (L) 13.0 - 17.0 g/dL   HCT 34.5 (L) 39.0 - 52.0 %   MCV 89.4 78.0 - 100.0 fL   MCH 30.3 26.0 - 34.0 pg   MCHC 33.9 30.0 - 36.0 g/dL   RDW 14.5 11.5 - 15.5 %   Platelets 217 150 - 400 K/uL  APTT     Status: Abnormal   Collection Time: 05/07/17  4:03 AM  Result Value Ref Range   aPTT 111 (H) 24 - 36 seconds    Comment:        IF BASELINE aPTT IS ELEVATED, SUGGEST PATIENT RISK ASSESSMENT BE USED TO DETERMINE APPROPRIATE ANTICOAGULANT THERAPY.   Heparin level (unfractionated)     Status: Abnormal   Collection Time: 05/07/17  4:04 AM  Result Value Ref Range   Heparin Unfractionated 1.78 (H) 0.30 - 0.70 IU/mL    Comment: RESULTS CONFIRMED BY MANUAL DILUTION        IF HEPARIN RESULTS ARE BELOW EXPECTED VALUES, AND PATIENT DOSAGE HAS BEEN CONFIRMED, SUGGEST FOLLOW UP TESTING OF ANTITHROMBIN III LEVELS.   Glucose, capillary     Status: Abnormal   Collection Time: 05/07/17  8:11 AM  Result Value Ref Range   Glucose-Capillary 148 (H) 65 - 99 mg/dL  Surgical pcr screen     Status: None   Collection Time: 05/07/17 10:31 AM  Result Value Ref Range   MRSA, PCR NEGATIVE NEGATIVE   Staphylococcus aureus NEGATIVE NEGATIVE    Comment:        The Xpert SA Assay (FDA approved for NASAL specimens in patients over 61 years of age), is one component of a comprehensive surveillance program.  Test performance has been validated by [facility] for patients greater than or equal to 65 year old. It is not intended to diagnose infection nor to guide or monitor treatment.   Glucose, capillary     Status: Abnormal   Collection Time: 05/07/17 10:43 AM  Result Value Ref Range   Glucose-Capillary 176 (H) 65 - 99 mg/dL  Glucose, capillary     Status: Abnormal   Collection Time:  05/07/17  3:10 PM  Result Value Ref Range   Glucose-Capillary 158 (H) 65 - 99 mg/dL  APTT     Status: Abnormal   Collection Time: 05/07/17  3:24 PM  Result Value Ref Range   aPTT 41 (H) 24 - 36 seconds    Comment:        IF BASELINE aPTT IS ELEVATED, SUGGEST PATIENT RISK ASSESSMENT BE USED TO DETERMINE APPROPRIATE  ANTICOAGULANT THERAPY.   Heparin level (unfractionated)     Status: Abnormal   Collection Time: 05/07/17  3:24 PM  Result Value Ref Range   Heparin Unfractionated 1.27 (H) 0.30 - 0.70 IU/mL    Comment:        IF HEPARIN RESULTS ARE BELOW EXPECTED VALUES, AND PATIENT DOSAGE HAS BEEN CONFIRMED, SUGGEST FOLLOW UP TESTING OF ANTITHROMBIN III LEVELS. RESULTS CONFIRMED BY MANUAL DILUTION   Glucose, capillary     Status: Abnormal   Collection Time: 05/07/17  4:55 PM  Result Value Ref Range   Glucose-Capillary 165 (H) 65 - 99 mg/dL  Basic metabolic panel     Status: Abnormal   Collection Time: 05/07/17  8:38 PM  Result Value Ref Range   Sodium 140 135 - 145 mmol/L   Potassium 3.9 3.5 - 5.1 mmol/L    Comment: DELTA CHECK NOTED   Chloride 116 (H) 101 - 111 mmol/L   CO2 17 (L) 22 - 32 mmol/L   Glucose, Bld 177 (H) 65 - 99 mg/dL   BUN 74 (H) 6 - 20 mg/dL   Creatinine, Ser 3.35 (H) 0.61 - 1.24 mg/dL   Calcium 6.8 (L) 8.9 - 10.3 mg/dL   GFR calc non Af Amer 16 (L) >60 mL/min   GFR calc Af Amer 19 (L) >60 mL/min    Comment: (NOTE) The eGFR has been calculated using the CKD EPI equation. This calculation has not been validated in all clinical situations. eGFR's persistently <60 mL/min signify possible Chronic Kidney Disease.    Anion gap 7 5 - 15  Glucose, capillary     Status: Abnormal   Collection Time: 05/07/17  8:49 PM  Result Value Ref Range   Glucose-Capillary 199 (H) 65 - 99 mg/dL  APTT     Status: Abnormal   Collection Time: 05/08/17  1:42 AM  Result Value Ref Range   aPTT 80 (H) 24 - 36 seconds    Comment:        IF BASELINE aPTT IS ELEVATED, SUGGEST  PATIENT RISK ASSESSMENT BE USED TO DETERMINE APPROPRIATE ANTICOAGULANT THERAPY.   Heparin level (unfractionated)     Status: Abnormal   Collection Time: 05/08/17  1:42 AM  Result Value Ref Range   Heparin Unfractionated 0.95 (H) 0.30 - 0.70 IU/mL    Comment:        IF HEPARIN RESULTS ARE BELOW EXPECTED VALUES, AND PATIENT DOSAGE HAS BEEN CONFIRMED, SUGGEST FOLLOW UP TESTING OF ANTITHROMBIN III LEVELS.   CBC     Status: Abnormal   Collection Time: 05/08/17  4:42 AM  Result Value Ref Range   WBC 11.9 (H) 4.0 - 10.5 K/uL   RBC 3.94 (L) 4.22 - 5.81 MIL/uL   Hemoglobin 11.8 (L) 13.0 - 17.0 g/dL   HCT 35.7 (L) 39.0 - 52.0 %   MCV 90.6 78.0 - 100.0 fL   MCH 29.9 26.0 - 34.0 pg   MCHC 33.1 30.0 - 36.0 g/dL   RDW 14.6 11.5 - 15.5 %   Platelets 202 150 - 400 K/uL  Basic metabolic panel     Status: Abnormal   Collection Time: 05/08/17  4:42 AM  Result Value Ref Range   Sodium 139 135 - 145 mmol/L   Potassium 4.8 3.5 - 5.1 mmol/L    Comment: DELTA CHECK NOTED   Chloride 112 (H) 101 - 111 mmol/L   CO2 18 (L) 22 - 32 mmol/L   Glucose, Bld 163 (H) 65 - 99 mg/dL  BUN 86 (H) 6 - 20 mg/dL   Creatinine, Ser 3.83 (H) 0.61 - 1.24 mg/dL   Calcium 8.5 (L) 8.9 - 10.3 mg/dL   GFR calc non Af Amer 14 (L) >60 mL/min   GFR calc Af Amer 16 (L) >60 mL/min    Comment: (NOTE) The eGFR has been calculated using the CKD EPI equation. This calculation has not been validated in all clinical situations. eGFR's persistently <60 mL/min signify possible Chronic Kidney Disease.    Anion gap 9 5 - 15  Glucose, capillary     Status: Abnormal   Collection Time: 05/08/17  8:25 AM  Result Value Ref Range   Glucose-Capillary 162 (H) 65 - 99 mg/dL  Glucose, capillary     Status: Abnormal   Collection Time: 05/08/17 12:52 PM  Result Value Ref Range   Glucose-Capillary 193 (H) 65 - 99 mg/dL  Glucose, capillary     Status: Abnormal   Collection Time: 05/08/17  1:15 PM  Result Value Ref Range    Glucose-Capillary 173 (H) 65 - 99 mg/dL    Dg Retrograde Pyelogram  Result Date: 05/07/2017 CLINICAL DATA:  Nephrolithiasis EXAM: RETROGRADE PYELOGRAM; DG C-ARM 61-120 MIN COMPARISON:  CT abdomen and pelvis Apr 14, 2017 FLUOROSCOPY TIME:  2 minutes 38 seconds; 7 acquired images FINDINGS: Retrograde filling of both ureters and collecting systems was performed intraoperatively. On the right, there is dilatation of the distal right ureter with an apparent stricture in the ureter at the mid sacral level. There is a rather abrupt band in the proximal right ureter just distal to the right ureteropelvic junction. No calculus is seen on this study on the right. On the left, there is a filling defect in the proximal left ureter consistent with a calculus. There is also a calculus in a lower pole calyx on the left. There is fullness of the left renal pelvis. Double-J stents were subsequently placed bilaterally. IMPRESSION: Calculi in the proximal left ureter in lower pole left renal calyx with double-J stent subsequently placed on the left. On the right, apparent stricture in the right ureter at the mid sacral level. Abrupt turn with possible kink in the proximal right ureter with double-J stent placed on the right. Electronically Signed   By: Lowella Grip III M.D.   On: 05/07/2017 15:14   US Renal  Result Date: 05/08/2017 CLINICAL DATA:  Hydronephrosis.  History of prostate cancer. EXAM: RENAL / URINARY TRACT ULTRASOUND COMPLETE COMPARISON:  Ultrasound of May 06, 2017. FINDINGS: Right Kidney: Length: 12.9 cm. Echogenicity within normal limits. Right ureteral stent is not well visualized. No mass visualized. Moderate hydronephrosis is noted. Left Kidney: Length: 14.3 cm. Echogenicity within normal limits. No mass visualized. Moderate hydronephrosis is noted. Left ureteral stent is noted. Bladder: Foley catheter is noted in urinary bladder. Bladder appears to be otherwise normal for degree of distension.  IMPRESSION: Moderate bilateral hydronephrosis is noted. Left ureteral stent is noted. Right ureteral stent is not well visualized. Electronically Signed   By: Marijo Conception, M.D.   On: 05/08/2017 12:29   US Renal  Result Date: 05/06/2017 CLINICAL DATA:  Acute renal failure EXAM: RENAL / URINARY TRACT ULTRASOUND COMPLETE COMPARISON:  CT abdomen/ pelvis dated 04/14/2017 FINDINGS: Right Kidney: Length: 13.8 cm.  Moderate right hydronephrosis, new. Left Kidney: Length: 14.8 cm.  Moderate left hydronephrosis, unchanged Bladder: Not discretely visualized. Additional comments:  Right pleural effusion.  Ascites. IMPRESSION: Moderate left hydronephrosis, unchanged from prior CT. Moderate right hydronephrosis, new. Bladder is  not discretely visualized. Electronically Signed   By: Julian Hy M.D.   On: 05/06/2017 18:56   Dg C-arm 1-60 Min  Result Date: 05/07/2017 CLINICAL DATA:  Nephrolithiasis EXAM: RETROGRADE PYELOGRAM; DG C-ARM 61-120 MIN COMPARISON:  CT abdomen and pelvis Apr 14, 2017 FLUOROSCOPY TIME:  2 minutes 38 seconds; 7 acquired images FINDINGS: Retrograde filling of both ureters and collecting systems was performed intraoperatively. On the right, there is dilatation of the distal right ureter with an apparent stricture in the ureter at the mid sacral level. There is a rather abrupt band in the proximal right ureter just distal to the right ureteropelvic junction. No calculus is seen on this study on the right. On the left, there is a filling defect in the proximal left ureter consistent with a calculus. There is also a calculus in a lower pole calyx on the left. There is fullness of the left renal pelvis. Double-J stents were subsequently placed bilaterally. IMPRESSION: Calculi in the proximal left ureter in lower pole left renal calyx with double-J stent subsequently placed on the left. On the right, apparent stricture in the right ureter at the mid sacral level. Abrupt turn with possible kink in  the proximal right ureter with double-J stent placed on the right. Electronically Signed   By: Lowella Grip III M.D.   On: 05/07/2017 15:14    ROS Blood pressure (!) 145/75, pulse 75, temperature 97.4 F (36.3 C), temperature source Oral, resp. rate 17, height 6' 1" (1.854 m), weight 91.9 kg (202 lb 9.6 oz), SpO2 97 %. Physical Exam Physical Examination: General appearance - pale, alert Mental status - memory issues, and ? Some of hx Eyes - pupils equal and reactive, extraocular eye movements intact, funduscopic exam normal, discs flat and sharp Mouth - mucous membranes moist, pharynx normal without lesions Neck - adenopathy noted PCL Lymphatics - posterior cervical nodes Chest - decreased air entry noted bilat Heart - normal rate, regular rhythm, normal S1, S2, no murmurs, rubs, clicks or gallops, S1 and S2 normal, systolic murmur SF6/8 at 2nd left intercostal space Abdomen - soft, pos bs, liver down 5 cm Extremities - peripheral pulses normal, no pedal edema, no clubbing or cyanosis Skin - normal coloration and turgor, no rashes, no suspicious skin lesions noted  Assessment/Plan: 1 AKI appears mostly obstructive, but has been going on a while and had Cr ^ a mon ago.  Initial lowering of Cr with stent and now ^ Cr assoc with lower urine output,suspect stent dysfunction. ?clotting.  Acidemic , mild uremia.  Other poss etio, hemodyanmic with ACEI, but doubt that is large part. 2 Obstructive uropathy 3 Afib on anticoag, ? Bleeding causing blood in stents 4 Hypertension: mild ^, follow 5. Anemia mild follow 6. Metabolic Bone Dz check PTH 7 Prostate Ca ?? Role P stop ivf, eval stents, bicarb,   Melburn Treiber L 05/08/2017, 1:34 PM

## 2017-05-08 NOTE — Progress Notes (Signed)
Physical Therapy Treatment Patient Details Name: William [name] MRN: [id number] DOB: [birth year] Today's Date: 05/08/2017    History of Present Illness William [name] is a 83 y.o. male with a history of AFib on eliquis, CVA in Nov 2017, HTN, IDDM, prostate CA,and  OA s/p bilateral TKA who was brought to the ED by his family due to progressive generalized weakness leading to recurrent falls. Complex UTI, hydronephrosis, kidney injury    PT Comments    Continuing work on functional mobility and activity tolerance;  Noting pt needs more assist today for functional mobility, slightly more sleepy, but still willing to work; We discussed possibility of getting post-acute rehab to maximize independence and safety with mobility prior to dc home; pt and family are open to this if needed, but would rather dc home; He was independent before this admission -- worth consider CIR; will place screen  Follow Up Recommendations  Other (comment) May need to consider post-acute rehab     Equipment Recommendations  Rolling walker with 5" wheels    Recommendations for Other Services OT consult     Precautions / Restrictions Precautions Precautions: Fall    Mobility  Bed Mobility Overal bed mobility: Needs Assistance Bed Mobility: Supine to Sit     Supine to sit: Mod assist     General bed mobility comments: Light mod assist to pull to sit  Transfers Overall transfer level: Needs assistance Equipment used: Rolling walker (2 wheeled) Transfers: Sit to/from Stand Sit to Stand: Mod assist         General transfer comment: Light mod assist to power up  Ambulation/Gait Ambulation/Gait assistance: Min assist Ambulation Distance (Feet): 10 Feet Assistive device: Rolling walker (2 wheeled) Gait Pattern/deviations: Step-through pattern     General Gait Details: Cues for RW proximity and use; occasionally step soutside RW; benefits from bil UE support   Stairs            Wheelchair  Mobility    Modified Rankin (Stroke Patients Only)       Balance             Standing balance-Leahy Scale: Poor                              Cognition Arousal/Alertness: Awake/alert Behavior During Therapy: WFL for tasks assessed/performed Overall Cognitive Status: Within Functional Limits for tasks assessed                                        Exercises      General Comments        Pertinent Vitals/Pain Pain Assessment: No/denies pain    Home Living                      Prior Function            PT Goals (current goals can now be found in the care plan section) Acute Rehab PT Goals Patient Stated Goal: back to normal PT Goal Formulation: With patient Time For Goal Achievement: 05/21/17 Potential to Achieve Goals: Good Progress towards PT goals: Not progressing toward goals - comment (Requiring more assist today)    Frequency    Min 3X/week      PT Plan Other (comment)    Co-evaluation  AM-PAC PT "6 Clicks" Daily Activity  Outcome Measure  Difficulty turning over in bed (including adjusting bedclothes, sheets and blankets)?: A Lot Difficulty moving from lying on back to sitting on the side of the bed? : Total Difficulty sitting down on and standing up from a chair with arms (e.g., wheelchair, bedside commode, etc,.)?: Total Help needed moving to and from a bed to chair (including a wheelchair)?: A Little Help needed walking in hospital room?: A Little Help needed climbing 3-5 steps with a railing? : A Lot 6 Click Score: 12    End of Session Equipment Utilized During Treatment: Gait belt Activity Tolerance: Patient tolerated treatment well Patient left: in chair;with call bell/phone within reach;with nursing/sitter in room;Other (comment) (Maleeya, NT, finishing up bath) Nurse Communication: Mobility status PT Visit Diagnosis: Unsteadiness on feet (R26.81);Other abnormalities of gait  and mobility (R26.89);Muscle weakness (generalized) (M62.81)     Time: 8403-7543 PT Time Calculation (min) (ACUTE ONLY): 35 min  Charges:  $Gait Training: 8-22 mins $Therapeutic Activity: 8-22 mins                    G Codes:       Roney Marion, PT  Acute Rehabilitation Services Pager [phone] Office [place] 05/08/2017, 4:59 PM

## 2017-05-08 NOTE — Progress Notes (Addendum)
1 Day Post-Op Subjective: Patient reports no complaints.   Objective: Vital signs in last 24 hours: Temp:  [97.3 F (36.3 C)-98.7 F (37.1 C)] 98 F (36.7 C) (06/11 0457) Pulse Rate:  [60-85] 67 (06/11 0457) Resp:  [16-27] 16 (06/11 0457) BP: (127-188)/(67-108) 158/67 (06/11 0457) SpO2:  [95 %-100 %] 97 % (06/11 0457) Weight:  [91.9 kg (202 lb 8 oz)-91.9 kg (202 lb 9.6 oz)] 91.9 kg (202 lb 9.6 oz) (06/11 0500)  Intake/Output from previous day: 06/10 0701 - 06/11 0700 In: 2435.4 [P.O.:360; I.V.:2025.4; IV Piggyback:50] Out: 1500 [Urine:1450; Blood:50] Intake/Output this shift: No intake/output data recorded.  Physical Exam:  NAD Alert Abd soft NT Urine - red, purple - no clots, thin -- another 200 mL in bag  Lab Results:  Recent Labs  05/06/17 1224 05/07/17 0403 05/08/17 0442  HGB 13.7 11.7* 11.8*  HCT 40.3 34.5* 35.7*   BMET  Recent Labs  05/07/17 2038 05/08/17 0442  NA 140 139  K 3.9 4.8  CL 116* 112*  CO2 17* 18*  GLUCOSE 177* 163*  BUN 74* 86*  CREATININE 3.35* 3.83*  CALCIUM 6.8* 8.5*   No results for input(s): LABPT, INR in the last 72 hours. No results for input(s): LABURIN in the last 72 hours. Results for orders placed or performed during the hospital encounter of 05/06/17  Urine Culture     Status: Abnormal (Preliminary result)   Collection Time: 05/06/17  4:43 PM  Result Value Ref Range Status   Specimen Description URINE, RANDOM  Final   Special Requests NONE  Final   Culture (A)  Final    50,000 COLONIES/mL STAPHYLOCOCCUS SPECIES (COAGULASE NEGATIVE) SUSCEPTIBILITIES TO FOLLOW    Report Status PENDING  Incomplete  Culture, blood (routine x 2)     Status: None (Preliminary result)   Collection Time: 05/06/17  4:53 PM  Result Value Ref Range Status   Specimen Description BLOOD RIGHT FOREARM  Final   Special Requests   Final    BOTTLES DRAWN AEROBIC AND ANAEROBIC Blood Culture adequate volume   Culture NO GROWTH < 24 HOURS  Final   Report Status PENDING  Incomplete  Culture, blood (routine x 2)     Status: None (Preliminary result)   Collection Time: 05/06/17  5:10 PM  Result Value Ref Range Status   Specimen Description BLOOD LEFT ANTECUBITAL  Final   Special Requests   Final    BOTTLES DRAWN AEROBIC AND ANAEROBIC Blood Culture adequate volume   Culture NO GROWTH < 24 HOURS  Final   Report Status PENDING  Incomplete  Surgical pcr screen     Status: None   Collection Time: 05/07/17 10:31 AM  Result Value Ref Range Status   MRSA, PCR NEGATIVE NEGATIVE Final   Staphylococcus aureus NEGATIVE NEGATIVE Final    Comment:        The Xpert SA Assay (FDA approved for NASAL specimens in patients over 50 years of age), is one component of a comprehensive surveillance program.  Test performance has been validated by [facility] for patients greater than or equal to 3 year old. It is not intended to diagnose infection nor to guide or monitor treatment.     Studies/Results: Dg Retrograde Pyelogram  Result Date: 05/07/2017 CLINICAL DATA:  Nephrolithiasis EXAM: RETROGRADE PYELOGRAM; DG C-ARM 61-120 MIN COMPARISON:  CT abdomen and pelvis Apr 14, 2017 FLUOROSCOPY TIME:  2 minutes 38 seconds; 7 acquired images FINDINGS: Retrograde filling of both ureters and collecting systems was performed  intraoperatively. On the right, there is dilatation of the distal right ureter with an apparent stricture in the ureter at the mid sacral level. There is a rather abrupt band in the proximal right ureter just distal to the right ureteropelvic junction. No calculus is seen on this study on the right. On the left, there is a filling defect in the proximal left ureter consistent with a calculus. There is also a calculus in a lower pole calyx on the left. There is fullness of the left renal pelvis. Double-J stents were subsequently placed bilaterally. IMPRESSION: Calculi in the proximal left ureter in lower pole left renal calyx with double-J  stent subsequently placed on the left. On the right, apparent stricture in the right ureter at the mid sacral level. Abrupt turn with possible kink in the proximal right ureter with double-J stent placed on the right. Electronically Signed   By: Lowella Grip III M.D.   On: 05/07/2017 15:14   US Renal  Result Date: 05/06/2017 CLINICAL DATA:  Acute renal failure EXAM: RENAL / URINARY TRACT ULTRASOUND COMPLETE COMPARISON:  CT abdomen/ pelvis dated 04/14/2017 FINDINGS: Right Kidney: Length: 13.8 cm.  Moderate right hydronephrosis, new. Left Kidney: Length: 14.8 cm.  Moderate left hydronephrosis, unchanged Bladder: Not discretely visualized. Additional comments:  Right pleural effusion.  Ascites. IMPRESSION: Moderate left hydronephrosis, unchanged from prior CT. Moderate right hydronephrosis, new. Bladder is not discretely visualized. Electronically Signed   By: Julian Hy M.D.   On: 05/06/2017 18:56   Dg C-arm 1-60 Min  Result Date: 05/07/2017 CLINICAL DATA:  Nephrolithiasis EXAM: RETROGRADE PYELOGRAM; DG C-ARM 61-120 MIN COMPARISON:  CT abdomen and pelvis Apr 14, 2017 FLUOROSCOPY TIME:  2 minutes 38 seconds; 7 acquired images FINDINGS: Retrograde filling of both ureters and collecting systems was performed intraoperatively. On the right, there is dilatation of the distal right ureter with an apparent stricture in the ureter at the mid sacral level. There is a rather abrupt band in the proximal right ureter just distal to the right ureteropelvic junction. No calculus is seen on this study on the right. On the left, there is a filling defect in the proximal left ureter consistent with a calculus. There is also a calculus in a lower pole calyx on the left. There is fullness of the left renal pelvis. Double-J stents were subsequently placed bilaterally. IMPRESSION: Calculi in the proximal left ureter in lower pole left renal calyx with double-J stent subsequently placed on the left. On the right,  apparent stricture in the right ureter at the mid sacral level. Abrupt turn with possible kink in the proximal right ureter with double-J stent placed on the right. Electronically Signed   By: Lowella Grip III M.D.   On: 05/07/2017 15:14    Assessment/Plan: -bilateral hydro - s/p stents. Check renal US to ensure hydro resolved.  -AKI - Cr fell immediately now up some. Good UOP.  -hematuria - expected with stents/foley. Hgb stable. No clots.   Add: US showed continued hydro and UOP is down today. Pt somnolent again according to daughter. The foley did not irrigate or drain well in the OR, so I'm going to have my colleague take some fluid out of the balloon and make sure the catheter is draining. If UOP doesn't pick up overnight will have IR eval for perc Nx tubes.    LOS: 2 days   Sheneika Walstad 05/08/2017, 7:48 AM

## 2017-05-09 ENCOUNTER — Inpatient Hospital Stay (HOSPITAL_COMMUNITY): Payer: Medicare Other

## 2017-05-09 ENCOUNTER — Encounter (HOSPITAL_COMMUNITY): Payer: Self-pay | Admitting: General Surgery

## 2017-05-09 DIAGNOSIS — N179 Acute kidney failure, unspecified: Secondary | ICD-10-CM

## 2017-05-09 DIAGNOSIS — N1330 Unspecified hydronephrosis: Secondary | ICD-10-CM

## 2017-05-09 DIAGNOSIS — D72829 Elevated white blood cell count, unspecified: Secondary | ICD-10-CM

## 2017-05-09 DIAGNOSIS — I10 Essential (primary) hypertension: Secondary | ICD-10-CM

## 2017-05-09 DIAGNOSIS — E119 Type 2 diabetes mellitus without complications: Secondary | ICD-10-CM

## 2017-05-09 DIAGNOSIS — D62 Acute posthemorrhagic anemia: Secondary | ICD-10-CM

## 2017-05-09 DIAGNOSIS — Z8546 Personal history of malignant neoplasm of prostate: Secondary | ICD-10-CM

## 2017-05-09 DIAGNOSIS — Z794 Long term (current) use of insulin: Secondary | ICD-10-CM

## 2017-05-09 DIAGNOSIS — N390 Urinary tract infection, site not specified: Secondary | ICD-10-CM

## 2017-05-09 DIAGNOSIS — I4891 Unspecified atrial fibrillation: Secondary | ICD-10-CM

## 2017-05-09 HISTORY — PX: IR NEPHROSTOMY PLACEMENT RIGHT: IMG6064

## 2017-05-09 HISTORY — PX: IR NEPHROSTOMY PLACEMENT LEFT: IMG6063

## 2017-05-09 LAB — BASIC METABOLIC PANEL
ANION GAP: 9 (ref 5–15)
BUN: 95 mg/dL — AB (ref 6–20)
CALCIUM: 8.4 mg/dL — AB (ref 8.9–10.3)
CO2: 17 mmol/L — ABNORMAL LOW (ref 22–32)
Chloride: 114 mmol/L — ABNORMAL HIGH (ref 101–111)
Creatinine, Ser: 4.64 mg/dL — ABNORMAL HIGH (ref 0.61–1.24)
GFR calc Af Amer: 12 mL/min — ABNORMAL LOW (ref >60)
GFR, EST NON AFRICAN AMERICAN: 11 mL/min — AB (ref >60)
GLUCOSE: 169 mg/dL — AB (ref 65–99)
Potassium: 5 mmol/L (ref 3.5–5.1)
SODIUM: 140 mmol/L (ref 135–145)

## 2017-05-09 LAB — RENAL FUNCTION PANEL
ANION GAP: 14 (ref 5–15)
Albumin: 2 g/dL — ABNORMAL LOW (ref 3.5–5.0)
BUN: 99 mg/dL — ABNORMAL HIGH (ref 6–20)
CHLORIDE: 113 mmol/L — AB (ref 101–111)
CO2: 14 mmol/L — AB (ref 22–32)
Calcium: 8.7 mg/dL — ABNORMAL LOW (ref 8.9–10.3)
Creatinine, Ser: 4.95 mg/dL — ABNORMAL HIGH (ref 0.61–1.24)
GFR calc Af Amer: 12 mL/min — ABNORMAL LOW (ref >60)
GFR calc non Af Amer: 10 mL/min — ABNORMAL LOW (ref >60)
GLUCOSE: 188 mg/dL — AB (ref 65–99)
POTASSIUM: 5.2 mmol/L — AB (ref 3.5–5.1)
Phosphorus: 5.6 mg/dL — ABNORMAL HIGH (ref 2.5–4.6)
Sodium: 141 mmol/L (ref 135–145)

## 2017-05-09 LAB — GLUCOSE, CAPILLARY
GLUCOSE-CAPILLARY: 165 mg/dL — AB (ref 65–99)
Glucose-Capillary: 177 mg/dL — ABNORMAL HIGH (ref 65–99)
Glucose-Capillary: 139 mg/dL — ABNORMAL HIGH (ref 65–99)
Glucose-Capillary: 144 mg/dL — ABNORMAL HIGH (ref 65–99)

## 2017-05-09 LAB — CBC
HCT: 33.7 % — ABNORMAL LOW (ref 39.0–52.0)
Hemoglobin: 11.3 g/dL — ABNORMAL LOW (ref 13.0–17.0)
MCH: 30.2 pg (ref 26.0–34.0)
MCHC: 33.5 g/dL (ref 30.0–36.0)
MCV: 90.1 fL (ref 78.0–100.0)
PLATELETS: 195 10*3/uL (ref 150–400)
RBC: 3.74 MIL/uL — AB (ref 4.22–5.81)
RDW: 14.6 % (ref 11.5–15.5)
WBC: 13.4 10*3/uL — ABNORMAL HIGH (ref 4.0–10.5)

## 2017-05-09 LAB — PROTIME-INR
INR: 1.22
Prothrombin Time: 15.5 seconds — ABNORMAL HIGH (ref 11.4–15.2)

## 2017-05-09 LAB — APTT
APTT: 67 s — AB (ref 24–36)
APTT: 34 s (ref 24–36)
aPTT: 72 seconds — ABNORMAL HIGH (ref 24–36)

## 2017-05-09 LAB — HEPARIN LEVEL (UNFRACTIONATED): Heparin Unfractionated: 0.33 IU/mL (ref 0.30–0.70)

## 2017-05-09 MED ORDER — SODIUM BICARBONATE 650 MG PO TABS
1300.0000 mg | ORAL_TABLET | Freq: Three times a day (TID) | ORAL | Status: DC
  Administered 2017-05-09 – 2017-05-12 (×10): 1300 mg via ORAL
  Filled 2017-05-09 (×11): qty 2

## 2017-05-09 MED ORDER — LIDOCAINE HCL 1 % IJ SOLN
INTRAMUSCULAR | Status: AC
  Filled 2017-05-09: qty 20

## 2017-05-09 MED ORDER — FENTANYL CITRATE (PF) 100 MCG/2ML IJ SOLN
INTRAMUSCULAR | Status: AC | PRN
  Administered 2017-05-09: 50 ug via INTRAVENOUS
  Administered 2017-05-09 (×2): 25 ug via INTRAVENOUS

## 2017-05-09 MED ORDER — MIDAZOLAM HCL 2 MG/2ML IJ SOLN
INTRAMUSCULAR | Status: AC
  Filled 2017-05-09: qty 4

## 2017-05-09 MED ORDER — CIPROFLOXACIN IN D5W 400 MG/200ML IV SOLN: 400.0000 mg | INTRAVENOUS | Status: AC

## 2017-05-09 MED ORDER — LIDOCAINE HCL 1 % IJ SOLN
INTRAMUSCULAR | Status: AC | PRN
  Administered 2017-05-09: 10 mL

## 2017-05-09 MED ORDER — FLUMAZENIL 0.5 MG/5ML IV SOLN
INTRAVENOUS | Status: AC
  Filled 2017-05-09: qty 5

## 2017-05-09 MED ORDER — FENTANYL CITRATE (PF) 100 MCG/2ML IJ SOLN
INTRAMUSCULAR | Status: AC
Start: 2017-05-09 — End: 2017-05-10
  Filled 2017-05-09: qty 2

## 2017-05-09 MED ORDER — NALOXONE HCL 0.4 MG/ML IJ SOLN
INTRAMUSCULAR | Status: AC
  Filled 2017-05-09: qty 1

## 2017-05-09 MED ORDER — MIDAZOLAM HCL 2 MG/2ML IJ SOLN
INTRAMUSCULAR | Status: AC | PRN
  Administered 2017-05-09: 1 mg via INTRAVENOUS
  Administered 2017-05-09 (×2): 0.5 mg via INTRAVENOUS

## 2017-05-09 MED ORDER — ENSURE ENLIVE PO LIQD
237.0000 mL | Freq: Two times a day (BID) | ORAL | Status: DC
  Administered 2017-05-10 – 2017-05-13 (×7): 237 mL via ORAL

## 2017-05-09 MED ORDER — CIPROFLOXACIN IN D5W 400 MG/200ML IV SOLN
INTRAVENOUS | Status: AC
  Administered 2017-05-09: 400 mg
  Filled 2017-05-09: qty 200

## 2017-05-09 MED ORDER — HEPARIN (PORCINE) IN NACL 100-0.45 UNIT/ML-% IJ SOLN
1250.0000 [IU]/h | INTRAMUSCULAR | Status: DC
  Administered 2017-05-09 (×2): 1150 [IU]/h via INTRAVENOUS
  Filled 2017-05-09 (×2): qty 250

## 2017-05-09 MED ORDER — IOPAMIDOL (ISOVUE-300) INJECTION 61%
INTRAVENOUS | Status: AC
  Administered 2017-05-09: 15 mL
  Filled 2017-05-09: qty 50

## 2017-05-09 NOTE — Consult Note (Signed)
Physical Medicine and Rehabilitation Consult Reason for Consult: Decreased functional mobility Referring Physician: [facility]   HPI: William [name] is a 83 y.o. right handed male with history of hypertension, prostate cancer with radiation therapy, CVA November 2017, A. fib maintained on Eliquis. Per chart review and family patient lives with spouse, who can only provide supervision at discharge. Independent driving prior to admission. One level home. Wife limited physical assistance. Presented 05/06/2017 with generalized weakness/lethargy, weight loss and recurrent falls over the past 2 weeks as well as reported urinary frequency and nocturia. Creatinine elevated 2.1 from baseline 1.01 BUN 43. Recent CT revealed left hydronephrosis with dilation of the ureter down to the pelvic brim of the iliac and then the ureter appeared to taper to a normal caliber. Ultrasound follow-up revealed moderate left hydronephrosis. Urology consulted and underwent cystoscopy with bilateral retrograde myelogram with findings of ureteral strictures and underwent stent placement. Nephrology consulted for AKI felt to be mostly obstructive and continue to monitor. Chronic Eliquis remains on hold. Awaiting plans for possible need for bilateral neph tubes. Therapy evaluations ongoing. M.D. has requested physical medicine rehabilitation consult.   Review of Systems  Constitutional: Positive for malaise/fatigue and weight loss. Negative for fever.  Eyes: Negative for blurred vision and double vision.  Respiratory: Positive for shortness of breath. Negative for cough.   Cardiovascular: Negative for chest pain.  Gastrointestinal: Positive for constipation. Negative for nausea and vomiting.  Genitourinary: Positive for frequency and urgency. Negative for hematuria.  Musculoskeletal: Positive for falls and myalgias.  Skin: Negative for rash.  Neurological: Positive for weakness. Negative for seizures.  All other systems  reviewed and are negative.  Past Medical History:  Diagnosis Date  . Arthritis   . Diabetes mellitus without complication ([place])   . Hypercholesteremia   . Hypertension   . Prostate cancer [facility])    with radiation treatment  . Sleep apnea 2/14   "mild per patient" hasnt gotten CPAP machine  . Stroke [facility])    Past Surgical History:  Procedure Laterality Date  . ADENOIDECTOMY    . CYSTOSCOPY W/ URETERAL STENT PLACEMENT Bilateral 05/07/2017   Procedure: CYSTOSCOPY WITH BILATERAL RETROGRADE PYELOGRAM CYSTOGRAM BILATERAL URETERAL STENT PLACEMENT;  Surgeon: Festus Aloe, MD;  Location: [facility];  Service: Urology;  Laterality: Bilateral;  . FLEXIBLE SIGMOIDOSCOPY N/A 09/12/2013   Procedure: FLEXIBLE SIGMOIDOSCOPY;  Surgeon: Milus Banister, MD;  Location: [facility] END[facility]COPY;  Service: Endoscopy;  Laterality: N/A;  . HERNIA REPAIR    . HOT HEMOSTASIS N/A 09/12/2013   Procedure: HOT HEMOSTASIS (ARGON PLASMA COAGULATION/BICAP);  Surgeon: Milus Banister, MD;  Location: [facility];  Service: Endoscopy;  Laterality: N/A;  . LIPOMA EXCISION    . OTHER SURGICAL HISTORY     s/p prostate radiation  . TONSILLECTOMY    . TOTAL KNEE ARTHROPLASTY Left 03/25/2014   Procedure: LEFT TOTAL KNEE ARTHROPLASTY;  Surgeon: [name], MD;  Location: [facility];  Service: Orthopedics;  Laterality: Left;  . TOTAL KNEE ARTHROPLASTY Right 05/06/2014   Procedure: RIGHT TOTAL KNEE ARTHROPLASTY;  Surgeon: [name], MD;  Location: [facility];  Service: Orthopedics;  Laterality: Right;   Family History  Problem Relation Age of Onset  . Colon cancer Father   . Stroke Maternal Grandmother   . Esophageal cancer Neg Hx   . Rectal cancer Neg Hx   . Stomach cancer Neg Hx    Social History:  reports that he has quit smoking. He started smoking about  34 years ago. He has never used smokeless tobacco. He reports that he drinks about 0.6 oz of alcohol per week . He reports that he does not use drugs. Allergies:  Allergies    Allergen Reactions  . Glyburide Other (See Comments)    Hypoglycemia    Medications Prior to Admission  Medication Sig Dispense Refill  . acetaminophen (TYLENOL) 325 MG tablet Take 325-650 mg by mouth every 6 (six) hours as needed for mild pain.    Marland Kitchen amoxicillin (AMOXIL) 500 MG capsule Take 2,000 mg by mouth See admin instructions. ONE HOUR PRIOR TO DENTAL PROCEDURES    . apixaban (ELIQUIS) 5 MG TABS tablet Take 1 tablet (5 mg total) by mouth 2 (two) times daily. 30 tablet 0  . Coenzyme Q10 (CO Q-10) 300 MG CAPS Take 300 mg by mouth daily with breakfast.    . Glucosamine-Chondroit-Vit C-Mn (GLUCOSAMINE-CHONDROITIN) TABS Take 1 tablet by mouth 2 (two) times daily.    Marland Kitchen HUMALOG KWIKPEN 100 UNIT/ML KiwkPen Inject 6 Units into the skin 3 (three) times daily before meals.     Marland Kitchen loperamide (IMODIUM A-D) 2 MG tablet Take 2 mg by mouth 4 (four) times daily as needed for diarrhea or loose stools.    . ONGLYZA 5 MG TABS tablet Take 5 mg by mouth every morning.     . pravastatin (PRAVACHOL) 40 MG tablet Take 40 mg by mouth daily with supper.     Marland Kitchen LEVEMIR FLEXTOUCH 100 UNIT/ML Pen Inject 16 Units into the skin at bedtime.     Marland Kitchen lisinopril-hydrochlorothiazide (PRINZIDE,ZESTORETIC) 10-12.5 MG per tablet Take 1 tablet by mouth every morning.     . metFORMIN (GLUCOPHAGE) 1000 MG tablet Take 1,000 mg by mouth 2 (two) times daily with a meal.      Home: Home Living Family/patient expects to be discharged to:: Private residence Living Arrangements: Spouse/significant other Available Help at Discharge: Family, Available 24 hours/day Type of Home: House Home Access: Stairs to enter Technical brewer of Steps: 1 Entrance Stairs-Rails: None Home Layout: One level Bathroom Shower/Tub: Tub/shower unit, Gaffer, Theatre stage manager Toilet: Handicapped height Home Equipment: Grab bars - tub/shower, Radio producer - single point, Civil engineer, contracting - built in  Functional History: Prior Function Level of  Independence: Independent Comments: drives, takes Proofreader, has been [place] for a few years, loves to travel Functional Status:  Mobility: Bed Mobility Overal bed mobility: Needs Assistance Bed Mobility: Supine to Sit Supine to sit: Mod assist General bed mobility comments: Light mod assist to pull to sit Transfers Overall transfer level: Needs assistance Equipment used: Rolling walker (2 wheeled) Transfers: Sit to/from Stand Sit to Stand: Mod assist General transfer comment: Light mod assist to power up Ambulation/Gait Ambulation/Gait assistance: Min assist Ambulation Distance (Feet): 10 Feet Assistive device: Rolling walker (2 wheeled) Gait Pattern/deviations: Step-through pattern General Gait Details: Cues for RW proximity and use; occasionally step soutside RW; benefits from bil UE support    ADL:    Cognition: Cognition Overall Cognitive Status: Within Functional Limits for tasks assessed Orientation Level: Oriented to person, Oriented to place, Oriented to situation, Disoriented to time Cognition Arousal/Alertness: Awake/alert Behavior During Therapy: WFL for tasks assessed/performed Overall Cognitive Status: Within Functional Limits for tasks assessed  Blood pressure (!) 157/61, pulse 73, temperature 98 F (36.7 C), temperature source Oral, resp. rate 17, height '6\' 1"'$  (1.854 m), weight 91.9 kg (202 lb 9.6 oz), SpO2 96 %. Physical Exam  Vitals reviewed. Constitutional: He appears well-developed and well-nourished.  HENT:  Head: Normocephalic.  Right Ear: External ear normal.  Left Ear: External ear normal.  Eyes: EOM are normal. Right eye exhibits no discharge. Left eye exhibits no discharge.  Neck: Normal range of motion. Neck supple. No thyromegaly present.  Cardiovascular:  Irregularly Irregular  Respiratory: Effort normal and breath sounds normal. No respiratory distress.  GI: Soft. Bowel sounds are normal. He exhibits no distension.    Musculoskeletal: He exhibits no edema or tenderness.  Neurological: He is alert.  Provider name and age.  He does show some delay in processing.  Follows simple commands HOH Motor: Grossly 5/5 throughout Sensation intact to light touch  Skin: Skin is warm and dry.  Psychiatric: He has a normal mood and affect. His behavior is normal.    Results for orders placed or performed during the hospital encounter of 05/06/17 (from the past 24 hour(s))  Glucose, capillary     Status: Abnormal   Collection Time: 05/08/17  8:25 AM  Result Value Ref Range   Glucose-Capillary 162 (H) 65 - 99 mg/dL  APTT     Status: Abnormal   Collection Time: 05/08/17 12:52 PM  Result Value Ref Range   aPTT 77 (H) 24 - 36 seconds  Glucose, capillary     Status: Abnormal   Collection Time: 05/08/17 12:52 PM  Result Value Ref Range   Glucose-Capillary 193 (H) 65 - 99 mg/dL  Glucose, capillary     Status: Abnormal   Collection Time: 05/08/17  1:15 PM  Result Value Ref Range   Glucose-Capillary 173 (H) 65 - 99 mg/dL  Glucose, capillary     Status: Abnormal   Collection Time: 05/08/17  4:49 PM  Result Value Ref Range   Glucose-Capillary 169 (H) 65 - 99 mg/dL  Glucose, capillary     Status: Abnormal   Collection Time: 05/08/17  9:34 PM  Result Value Ref Range   Glucose-Capillary 158 (H) 65 - 99 mg/dL  CBC     Status: Abnormal   Collection Time: 05/09/17  6:08 AM  Result Value Ref Range   WBC 13.4 (H) 4.0 - 10.5 K/uL   RBC 3.74 (L) 4.22 - 5.81 MIL/uL   Hemoglobin 11.3 (L) 13.0 - 17.0 g/dL   HCT 33.7 (L) 39.0 - 52.0 %   MCV 90.1 78.0 - 100.0 fL   MCH 30.2 26.0 - 34.0 pg   MCHC 33.5 30.0 - 36.0 g/dL   RDW 14.6 11.5 - 15.5 %   Platelets 195 150 - 400 K/uL  APTT     Status: Abnormal   Collection Time: 05/09/17  6:08 AM  Result Value Ref Range   aPTT 67 (H) 24 - 36 seconds  Basic metabolic panel     Status: Abnormal   Collection Time: 05/09/17  6:08 AM  Result Value Ref Range   Sodium 140 135 - 145  mmol/L   Potassium 5.0 3.5 - 5.1 mmol/L   Chloride 114 (H) 101 - 111 mmol/L   CO2 17 (L) 22 - 32 mmol/L   Glucose, Bld 169 (H) 65 - 99 mg/dL   BUN 95 (H) 6 - 20 mg/dL   Creatinine, Ser 4.64 (H) 0.61 - 1.24 mg/dL   Calcium 8.4 (L) 8.9 - 10.3 mg/dL   GFR calc non Af Amer 11 (L) >60 mL/min   GFR calc Af Amer 12 (L) >60 mL/min   Anion gap 9 5 - 15  Glucose, capillary     Status: Abnormal   Collection Time: 05/09/17  7:39 AM  Result Value Ref Range   Glucose-Capillary 177 (H) 65 - 99 mg/dL   Dg Retrograde Pyelogram  Result Date: 05/07/2017 CLINICAL DATA:  Nephrolithiasis EXAM: RETROGRADE PYELOGRAM; DG C-ARM 61-120 MIN COMPARISON:  CT abdomen and pelvis Apr 14, 2017 FLUOROSCOPY TIME:  2 minutes 38 seconds; 7 acquired images FINDINGS: Retrograde filling of both ureters and collecting systems was performed intraoperatively. On the right, there is dilatation of the distal right ureter with an apparent stricture in the ureter at the mid sacral level. There is a rather abrupt band in the proximal right ureter just distal to the right ureteropelvic junction. No calculus is seen on this study on the right. On the left, there is a filling defect in the proximal left ureter consistent with a calculus. There is also a calculus in a lower pole calyx on the left. There is fullness of the left renal pelvis. Double-J stents were subsequently placed bilaterally. IMPRESSION: Calculi in the proximal left ureter in lower pole left renal calyx with double-J stent subsequently placed on the left. On the right, apparent stricture in the right ureter at the mid sacral level. Abrupt turn with possible kink in the proximal right ureter with double-J stent placed on the right. Electronically Signed   By: Lowella Grip III M.D.   On: 05/07/2017 15:14   US Renal  Result Date: 05/08/2017 CLINICAL DATA:  Hydronephrosis.  History of prostate cancer. EXAM: RENAL / URINARY TRACT ULTRASOUND COMPLETE COMPARISON:  Ultrasound of  May 06, 2017. FINDINGS: Right Kidney: Length: 12.9 cm. Echogenicity within normal limits. Right ureteral stent is not well visualized. No mass visualized. Moderate hydronephrosis is noted. Left Kidney: Length: 14.3 cm. Echogenicity within normal limits. No mass visualized. Moderate hydronephrosis is noted. Left ureteral stent is noted. Bladder: Foley catheter is noted in urinary bladder. Bladder appears to be otherwise normal for degree of distension. IMPRESSION: Moderate bilateral hydronephrosis is noted. Left ureteral stent is noted. Right ureteral stent is not well visualized. Electronically Signed   By: Marijo Conception, M.D.   On: 05/08/2017 12:29   Dg C-arm 1-60 Min  Result Date: 05/07/2017 CLINICAL DATA:  Nephrolithiasis EXAM: RETROGRADE PYELOGRAM; DG C-ARM 61-120 MIN COMPARISON:  CT abdomen and pelvis Apr 14, 2017 FLUOROSCOPY TIME:  2 minutes 38 seconds; 7 acquired images FINDINGS: Retrograde filling of both ureters and collecting systems was performed intraoperatively. On the right, there is dilatation of the distal right ureter with an apparent stricture in the ureter at the mid sacral level. There is a rather abrupt band in the proximal right ureter just distal to the right ureteropelvic junction. No calculus is seen on this study on the right. On the left, there is a filling defect in the proximal left ureter consistent with a calculus. There is also a calculus in a lower pole calyx on the left. There is fullness of the left renal pelvis. Double-J stents were subsequently placed bilaterally. IMPRESSION: Calculi in the proximal left ureter in lower pole left renal calyx with double-J stent subsequently placed on the left. On the right, apparent stricture in the right ureter at the mid sacral level. Abrupt turn with possible kink in the proximal right ureter with double-J stent placed on the right. Electronically Signed   By: Lowella Grip III M.D.   On: 05/07/2017 15:14     Assessment/Plan: Diagnosis: ureteral strictures s/p stents Labs independently reviewed.  Records reviewed and summated above.  1. Does the need for close, 24 hr/day medical  supervision in concert with the patient's rehab needs make it unreasonable for this patient to be served in a less intensive setting? Yes 2. Co-Morbidities requiring supervision/potential complications: AKI (recs per neprho, avoid nephrotoxic meds), hydronephrosis, HTN (monitor and provide prns in accordance with increased physical exertion and pain), prostate cancer with radiation therapy (cont to monitor), CVA November 2017, A. fib (transition from heparin ggt to oral meds per urology, monitor HR with increased physical activity), HTN (monitor and provide prns in accordance with increased physical exertion and pain), DM (Monitor in accordance with exercise and adjust meds as necessary), leukocytosis (cont to monitor for signs and symptoms of infection, further workup if indicated), ABLA (transfuse if necessary to ensure appropriate perfusion for increased activity tolerance) 3. Due to bladder management, safety, skin/wound care, disease management, pain management and patient education, does the patient require 24 hr/day rehab nursing? Yes 4. Does the patient require coordinated care of a physician, rehab nurse, PT (1-2 hrs/day, 5 days/week) and OT (1-2 hrs/day, 5 days/week) to address physical and functional deficits in the context of the above medical diagnosis(es)? Yes Addressing deficits in the following areas: balance, endurance, locomotion, bathing, dressing, toileting and psychosocial support 5. Can the patient actively participate in an intensive therapy program of at least 3 hrs of therapy per day at least 5 days per week? Yes 6. The potential for patient to make measurable gains while on inpatient rehab is excellent 7. Anticipated functional outcomes upon discharge from inpatient rehab are modified independent  with  PT, modified independent with OT, n/a with SLP. 8. Estimated rehab length of stay to reach the above functional goals is: 7-10 days. 9. Anticipated D/C setting: Home 10. Anticipated post D/C treatments: HH therapy and Home excercise program 11. Overall Rehab/Functional Prognosis: excellent  RECOMMENDATIONS: This patient's condition is appropriate for continued rehabilitative care in the following setting: Recommend CIR after medical interventions complete if persistent deficits, however, pt and family interested in SNF close to home. Patient has agreed to participate in recommended program. Potentially Note that insurance prior authorization may be required for reimbursement for recommended care.  Comment: Rehab Admissions Coordinator to follow up.  Delice Lesch, MD, Mellody Drown Cathlyn Parsons., PA-C 05/09/2017

## 2017-05-09 NOTE — Care Management Note (Addendum)
Case Management Note  Patient Details  Name: William [name] MRN: [id number] Date of Birth: 1934-05-18  Subjective/Objective:       CM following for progression and d/c planning. Pt from home with wife, independent with increased weakness and recent falls.              Action/Plan: 05/09/17 For bilateral nephrostomy tube placement today. CM will follow for progression and recommendations for d/c needs.  Noted recommendation for CIR and review by B Boyette, re possible CIR placement.   Expected Discharge Date:  05/11/17               Expected Discharge Plan:  [place]  In-House Referral:     Discharge planning Services  CM Consult  Post Acute Care Choice:    Choice offered to:     DME Arranged:    DME Agency:     HH Arranged:    [place] Agency:     Status of Service:  In process, will continue to follow  If discussed at Long Length of Stay Meetings, dates discussed:    Additional Comments:  Adron Bene, RN 05/09/2017, 1:52 PM

## 2017-05-09 NOTE — Procedures (Signed)
Interventional Radiology Procedure Note  Procedure:  Placement of bilateral PCN.  Bilateral 10.33F drainage catheters.   Complications: None Recommendations:  - Observe for urine clearing.  - Do not submerge  - Routine catheter care   Signed,  Dulcy Fanny. Earleen Newport, DO

## 2017-05-09 NOTE — Progress Notes (Signed)
Initial Nutrition Assessment  DOCUMENTATION CODES:   Not applicable  INTERVENTION:   -Ensure Enlive po BID, each supplement provides 350 kcal and 20 grams of protein   NUTRITION DIAGNOSIS:   Inadequate oral intake related to poor appetite as evidenced by meal completion < 25%.  GOAL:   Patient will meet greater than or equal to 90% of their needs  MONITOR:   PO intake, Supplement acceptance, Labs, Weight trends  REASON FOR ASSESSMENT:   Malnutrition Screening Tool    ASSESSMENT:   83 yo male admitted with AKI, obstructive uropathy, bilateral hydronephrosis. Pt with hx of DM, HTN, prostate cancer, CVA  Recorded po intake 0-25% of meals, appetite poor  6/10 Ureteral stent placement  9.2% wt loss in 4 months per weight encounters.   Unable to complete Nutrition-Focused physical exam at this time.   Labs: Creatinine 4.64, BUN 95, potassium 5.0 Meds: reviewed  Diet Order:  Diet heart healthy/carb modified Room service appropriate? Yes; Fluid consistency: Thin  Skin:  Reviewed, no issues  Last BM:  05/08/17  Height:   Ht Readings from Last 1 Encounters:  05/06/17 6\' 1"  (1.854 m)    Weight:   Wt Readings from Last 1 Encounters:  05/09/17 207 lb 6.4 oz (94.1 kg)    Ideal Body Weight:     BMI:  Body mass index is 27.36 kg/m.  Estimated Nutritional Needs:   Kcal:  2000-2300 kcals  Protein:  100-115 g  Fluid:  >/= 2 L  EDUCATION NEEDS:   No education needs identified at this time  [place], [place], LDN [phone] Pager  [phone] Weekend/On-Call Pager

## 2017-05-09 NOTE — Progress Notes (Signed)
ANTICOAGULATION CONSULT NOTE - Follow Up Consult  Pharmacy Consult for heparin (apixaban on hold) Indication: atrial fibrillation  Allergies  Allergen Reactions  . Glyburide Other (See Comments)    Hypoglycemia     Patient Measurements: Height: 6\' 1"  (185.4 cm) Weight: 207 lb 6.4 oz (94.1 kg) IBW/kg (Calculated) : 79.9  Vital Signs: Temp: 98 F (36.7 C) (06/12 1002) Temp Source: Oral (06/12 1002) BP: 158/79 (06/12 1400) Pulse Rate: 66 (06/12 1400)  Labs:  Recent Labs  05/07/17 0403 05/07/17 0404 05/07/17 1524 05/07/17 2038 05/08/17 0142 05/08/17 0442 05/08/17 1252 05/09/17 0608 05/09/17 1038  HGB 11.7*  --   --   --   --  11.8*  --  11.3*  --   HCT 34.5*  --   --   --   --  35.7*  --  33.7*  --   PLT 217  --   --   --   --  202  --  195  --   APTT 111*  --  41*  --  80*  --  77* 67* 34  LABPROT  --   --   --   --   --   --   --   --  15.5*  INR  --   --   --   --   --   --   --   --  1.22  HEPARINUNFRC  --  1.78* 1.27*  --  0.95*  --   --   --   --   CREATININE 5.01*  --   --  3.35*  --  3.83*  --  4.64*  --     Estimated Creatinine Clearance: 14.3 mL/min (A) (by C-G formula based on SCr of 4.64 mg/dL (H)).   Assessment: 83 y/o M to resume heparin drip for Afib while apixaban on hold. He is now s/p bilateral ureteral stents for hydronephrosis and AKI, now s/p bilateral nephrostomy tubes. Ok to resume heparin drip immediately per consult from IR. No bleeding noted, Hgb 11's stable, platelets are normal.  Goal of Therapy:  Heparin level 0.3-0.7 units/ml aPTT 66-102 seconds Monitor platelets by anticoagulation protocol: Yes   Plan:  - Resume heparin drip at 1150 units/hr with no bolus - 8 hr heparin level and PTT - Daily heparin level, PTT, CBC - Monitor for s/sx of bleeding   Renold Genta, PharmD, BCPS Clinical Pharmacist Phone for today - [place] - [phone] 05/09/2017 2:33 PM

## 2017-05-09 NOTE — Progress Notes (Signed)
Subjective: Interval History: has no complaint ,. Became aggitated and confused last pm.  Objective: Vital signs in last 24 hours: Temp:  [97.7 F (36.5 C)-98 F (36.7 C)] 98 F (36.7 C) (06/12 0452) Pulse Rate:  [73-77] 73 (06/12 0452) Resp:  [17] 17 (06/12 0452) BP: (143-165)/(61-100) 157/61 (06/12 0452) SpO2:  [96 %-100 %] 96 % (06/12 0452) Weight change:   Intake/Output from previous day: 06/11 0701 - 06/12 0700 In: 611.8 [P.O.:480; I.V.:131.8] Out: 825 [Urine:825] Intake/Output this shift: No intake/output data recorded.  General appearance: alert, cooperative, pale and slowed mentation Resp: rales bibasilar Cardio: irregularly irregular rhythm and S1, S2 normal GI: soft, non-tender; bowel sounds normal; no masses,  no organomegaly Extremities: edema 1+  Lab Results:  Recent Labs  05/08/17 0442 05/09/17 0608  WBC 11.9* 13.4*  HGB 11.8* 11.3*  HCT 35.7* 33.7*  PLT 202 195   BMET:  Recent Labs  05/08/17 0442 05/09/17 0608  NA 139 140  K 4.8 5.0  CL 112* 114*  CO2 18* 17*  GLUCOSE 163* 169*  BUN 86* 95*  CREATININE 3.83* 4.64*  CALCIUM 8.5* 8.4*   No results for input(s): PTH in the last 72 hours. Iron Studies: No results for input(s): IRON, TIBC, TRANSFERRIN, FERRITIN in the last 72 hours.  Studies/Results: Dg Retrograde Pyelogram  Result Date: 05/07/2017 CLINICAL DATA:  Nephrolithiasis EXAM: RETROGRADE PYELOGRAM; DG C-ARM 61-120 MIN COMPARISON:  CT abdomen and pelvis Apr 14, 2017 FLUOROSCOPY TIME:  2 minutes 38 seconds; 7 acquired images FINDINGS: Retrograde filling of both ureters and collecting systems was performed intraoperatively. On the right, there is dilatation of the distal right ureter with an apparent stricture in the ureter at the mid sacral level. There is a rather abrupt band in the proximal right ureter just distal to the right ureteropelvic junction. No calculus is seen on this study on the right. On the left, there is a filling defect in  the proximal left ureter consistent with a calculus. There is also a calculus in a lower pole calyx on the left. There is fullness of the left renal pelvis. Double-J stents were subsequently placed bilaterally. IMPRESSION: Calculi in the proximal left ureter in lower pole left renal calyx with double-J stent subsequently placed on the left. On the right, apparent stricture in the right ureter at the mid sacral level. Abrupt turn with possible kink in the proximal right ureter with double-J stent placed on the right. Electronically Signed   By: Lowella Grip III M.D.   On: 05/07/2017 15:14   US Renal  Result Date: 05/08/2017 CLINICAL DATA:  Hydronephrosis.  History of prostate cancer. EXAM: RENAL / URINARY TRACT ULTRASOUND COMPLETE COMPARISON:  Ultrasound of May 06, 2017. FINDINGS: Right Kidney: Length: 12.9 cm. Echogenicity within normal limits. Right ureteral stent is not well visualized. No mass visualized. Moderate hydronephrosis is noted. Left Kidney: Length: 14.3 cm. Echogenicity within normal limits. No mass visualized. Moderate hydronephrosis is noted. Left ureteral stent is noted. Bladder: Foley catheter is noted in urinary bladder. Bladder appears to be otherwise normal for degree of distension. IMPRESSION: Moderate bilateral hydronephrosis is noted. Left ureteral stent is noted. Right ureteral stent is not well visualized. Electronically Signed   By: Marijo Conception, M.D.   On: 05/08/2017 12:29   Dg C-arm 1-60 Min  Result Date: 05/07/2017 CLINICAL DATA:  Nephrolithiasis EXAM: RETROGRADE PYELOGRAM; DG C-ARM 61-120 MIN COMPARISON:  CT abdomen and pelvis Apr 14, 2017 FLUOROSCOPY TIME:  2 minutes 38 seconds; 7  acquired images FINDINGS: Retrograde filling of both ureters and collecting systems was performed intraoperatively. On the right, there is dilatation of the distal right ureter with an apparent stricture in the ureter at the mid sacral level. There is a rather abrupt band in the proximal  right ureter just distal to the right ureteropelvic junction. No calculus is seen on this study on the right. On the left, there is a filling defect in the proximal left ureter consistent with a calculus. There is also a calculus in a lower pole calyx on the left. There is fullness of the left renal pelvis. Double-J stents were subsequently placed bilaterally. IMPRESSION: Calculi in the proximal left ureter in lower pole left renal calyx with double-J stent subsequently placed on the left. On the right, apparent stricture in the right ureter at the mid sacral level. Abrupt turn with possible kink in the proximal right ureter with double-J stent placed on the right. Electronically Signed   By: Lowella Grip III M.D.   On: 05/07/2017 15:14    I have reviewed the patient's current medications.  Assessment/Plan: 1 AKI worsened solute , acid/base still a problem.  Oliguric.  Needs adequate drainage as planned per Urology 2 Obstructive Uropathy 3 DM controlled 4 HTN  Ok 5 Prostate Ca 6 Confusion multifactorial  7 Hx CVA P PCNx, bicarb, follow chem,    LOS: 3 days   Malyssa Maris L 05/09/2017,9:34 AM

## 2017-05-09 NOTE — Consult Note (Signed)
[facility] CM Primary Care Navigator  05/09/2017  William [name] September 15, 1934 [id number]   Met with patient, wife(Pat)and other family members at the bedside to identify possible discharge needs. Wife reports that patient was having increased weakness resulting to falls, loss of appetite and loss of weight that had led to this admission. Patient's wife endorses Dr[facility] Jani [facility] as hisprimary [facility] provider.   Patient shared using [facility] at [facility] to obtain medications without difficulty.   He was managing hisown medications at home until few days prior to admission and wife was assisting him.  Wife and daughter Verdene Lennert) was providing transportation to hisdoctors'appointments as stated.  Patient was independent and active at home until after his stroke per daughters' report. Wife is hisprimary caregiver and daughters were assisting with care at home.  Anticipated discharge plan is [facility] ([facility] prior to returning back home per wife.  Family members voiced understanding to follow-up with primary care provider when he returns back home, for a post discharge follow-up visit.Patient letter (with PCP's contact number) was provided as theirreminder.  Discussed withwifeand family about [facility] CM services available for health management but denieshaving any further needs or concerns so far. Per wife, DM is being managed at home with medications, diet, exercise, follow-up with provider. His recent A1C is 6.2.  [facility] care management information provided for future needs that he may have.  For questions, please contact:  Dannielle Huh, BSN, RN- [facility] Primary Care Navigator  Telephone: [phone] [place]

## 2017-05-09 NOTE — Consult Note (Signed)
Chief Complaint: bilateral hydronephrosis  Referring Physician:Dr. Festus Aloe  Supervising Physician: Corrie Mckusick  Patient Status: [facility]  HPI: William [name] is a 83 y.o. male who was admitted over the weekend secondary to UTI, ARF, and lethargy with weakness.  He has had known chronic hydronephrosis, but nothing clinically significant to this point.  However, on admit his creatinine was noted to be around 5.  Urology placed bilateral stents; however, yesterday  He was having minimal drainage and output.  He had repeat imaging overnight that revealed worsening bilateral hydronephrosis.  A request for PCNs has been placed as his stents do not seem to be draining the kidneys and his renal function is worsening.  He is on eliquis as home for a fib and has been on a heparin drip here.  This has been held since 745 am this morning.  He is also NPO.  Past Medical History:  Past Medical History:  Diagnosis Date  . Arthritis   . Diabetes mellitus without complication ([place])   . Hypercholesteremia   . Hypertension   . Prostate cancer [facility])    with radiation treatment  . Sleep apnea 2/14   "mild per patient" hasnt gotten CPAP machine  . Stroke [facility])     Past Surgical History:  Past Surgical History:  Procedure Laterality Date  . ADENOIDECTOMY    . CYSTOSCOPY W/ URETERAL STENT PLACEMENT Bilateral 05/07/2017   Procedure: CYSTOSCOPY WITH BILATERAL RETROGRADE PYELOGRAM CYSTOGRAM BILATERAL URETERAL STENT PLACEMENT;  Surgeon: Festus Aloe, MD;  Location: [place];  Service: Urology;  Laterality: Bilateral;  . FLEXIBLE SIGMOIDOSCOPY N/A 09/12/2013   Procedure: FLEXIBLE SIGMOIDOSCOPY;  Surgeon: Milus Banister, MD;  Location: [facility];  Service: Endoscopy;  Laterality: N/A;  . HERNIA REPAIR    . HOT HEMOSTASIS N/A 09/12/2013   Procedure: HOT HEMOSTASIS (ARGON PLASMA COAGULATION/BICAP);  Surgeon: Milus Banister, MD;  Location: [facility];  Service: Endoscopy;  Laterality:  N/A;  . LIPOMA EXCISION    . OTHER SURGICAL HISTORY     s/p prostate radiation  . TONSILLECTOMY    . TOTAL KNEE ARTHROPLASTY Left 03/25/2014   Procedure: LEFT TOTAL KNEE ARTHROPLASTY;  Surgeon: [name], MD;  Location: [facility];  Service: Orthopedics;  Laterality: Left;  . TOTAL KNEE ARTHROPLASTY Right 05/06/2014   Procedure: RIGHT TOTAL KNEE ARTHROPLASTY;  Surgeon: [name], MD;  Location: [facility];  Service: Orthopedics;  Laterality: Right;    Family History:  Family History  Problem Relation Age of Onset  . Colon cancer Father   . Stroke Maternal Grandmother   . Esophageal cancer Neg Hx   . Rectal cancer Neg Hx   . Stomach cancer Neg Hx     Social History:  reports that he has quit smoking. He started smoking about 34 years ago. He has never used smokeless tobacco. He reports that he drinks about 0.6 oz of alcohol per week . He reports that he does not use drugs.  Allergies:  Allergies  Allergen Reactions  . Glyburide Other (See Comments)    Hypoglycemia     Medications: Medications reviewed in epic  Please HPI for pertinent positives, otherwise complete 10 system ROS negative.  Mallampati Score: MD Evaluation Airway: WNL Heart: WNL Abdomen: WNL Chest/ Lungs: WNL ASA  Classification: 3 Mallampati/Airway Score: One  Physical Exam: BP (!) 163/80 (BP Location: Left Arm)   Pulse 79   Temp 98 F (36.7 C) (Oral)   Resp 17  Ht '6\' 1"'$  (1.854 m)   Wt 207 lb 6.4 oz (94.1 kg)   SpO2 96%   BMI 27.36 kg/m  Body mass index is 27.36 kg/m. General: pleasant, WD, WN white male who is sitting up in his chair. HEENT: head is normocephalic, atraumatic.  Sclera are noninjected.  PERRL.  Ears and nose without any masses or lesions.  Mouth is pink and moist Heart: regular, rate, and rhythm.  Normal s1,s2. No obvious murmurs, gallops, or rubs noted.  Palpable radial pulses bilaterally Lungs: CTAB, no wheezes, rhonchi, or rales noted.  Respiratory effort nonlabored Abd:  soft, NT, ND, +BS, no masses, hernias, or organomegaly GU: foley in place with hematuria Psych: A&Ox3 with an appropriate affect.   Labs: Results for orders placed or performed during the hospital encounter of 05/06/17 (from the past 48 hour(s))  Glucose, capillary     Status: Abnormal   Collection Time: 05/07/17  3:10 PM  Result Value Ref Range   Glucose-Capillary 158 (H) 65 - 99 mg/dL  APTT     Status: Abnormal   Collection Time: 05/07/17  3:24 PM  Result Value Ref Range   aPTT 41 (H) 24 - 36 seconds    Comment:        IF BASELINE aPTT IS ELEVATED, SUGGEST PATIENT RISK ASSESSMENT BE USED TO DETERMINE APPROPRIATE ANTICOAGULANT THERAPY.   Heparin level (unfractionated)     Status: Abnormal   Collection Time: 05/07/17  3:24 PM  Result Value Ref Range   Heparin Unfractionated 1.27 (H) 0.30 - 0.70 IU/mL    Comment:        IF HEPARIN RESULTS ARE BELOW EXPECTED VALUES, AND PATIENT DOSAGE HAS BEEN CONFIRMED, SUGGEST FOLLOW UP TESTING OF ANTITHROMBIN III LEVELS. RESULTS CONFIRMED BY MANUAL DILUTION   Glucose, capillary     Status: Abnormal   Collection Time: 05/07/17  4:55 PM  Result Value Ref Range   Glucose-Capillary 165 (H) 65 - 99 mg/dL  Basic metabolic panel     Status: Abnormal   Collection Time: 05/07/17  8:38 PM  Result Value Ref Range   Sodium 140 135 - 145 mmol/L   Potassium 3.9 3.5 - 5.1 mmol/L    Comment: DELTA CHECK NOTED   Chloride 116 (H) 101 - 111 mmol/L   CO2 17 (L) 22 - 32 mmol/L   Glucose, Bld 177 (H) 65 - 99 mg/dL   BUN 74 (H) 6 - 20 mg/dL   Creatinine, Ser 3.35 (H) 0.61 - 1.24 mg/dL   Calcium 6.8 (L) 8.9 - 10.3 mg/dL   GFR calc non Af Amer 16 (L) >60 mL/min   GFR calc Af Amer 19 (L) >60 mL/min    Comment: (NOTE) The eGFR has been calculated using the CKD EPI equation. This calculation has not been validated in all clinical situations. eGFR's persistently <60 mL/min signify possible Chronic Kidney Disease.    Anion gap 7 5 - 15  Glucose,  capillary     Status: Abnormal   Collection Time: 05/07/17  8:49 PM  Result Value Ref Range   Glucose-Capillary 199 (H) 65 - 99 mg/dL  APTT     Status: Abnormal   Collection Time: 05/08/17  1:42 AM  Result Value Ref Range   aPTT 80 (H) 24 - 36 seconds    Comment:        IF BASELINE aPTT IS ELEVATED, SUGGEST PATIENT RISK ASSESSMENT BE USED TO DETERMINE APPROPRIATE ANTICOAGULANT THERAPY.   Heparin level (unfractionated)     Status:  Abnormal   Collection Time: 05/08/17  1:42 AM  Result Value Ref Range   Heparin Unfractionated 0.95 (H) 0.30 - 0.70 IU/mL    Comment:        IF HEPARIN RESULTS ARE BELOW EXPECTED VALUES, AND PATIENT DOSAGE HAS BEEN CONFIRMED, SUGGEST FOLLOW UP TESTING OF ANTITHROMBIN III LEVELS.   CBC     Status: Abnormal   Collection Time: 05/08/17  4:42 AM  Result Value Ref Range   WBC 11.9 (H) 4.0 - 10.5 K/uL   RBC 3.94 (L) 4.22 - 5.81 MIL/uL   Hemoglobin 11.8 (L) 13.0 - 17.0 g/dL   HCT 35.7 (L) 39.0 - 52.0 %   MCV 90.6 78.0 - 100.0 fL   MCH 29.9 26.0 - 34.0 pg   MCHC 33.1 30.0 - 36.0 g/dL   RDW 14.6 11.5 - 15.5 %   Platelets 202 150 - 400 K/uL  Basic metabolic panel     Status: Abnormal   Collection Time: 05/08/17  4:42 AM  Result Value Ref Range   Sodium 139 135 - 145 mmol/L   Potassium 4.8 3.5 - 5.1 mmol/L    Comment: DELTA CHECK NOTED   Chloride 112 (H) 101 - 111 mmol/L   CO2 18 (L) 22 - 32 mmol/L   Glucose, Bld 163 (H) 65 - 99 mg/dL   BUN 86 (H) 6 - 20 mg/dL   Creatinine, Ser 3.83 (H) 0.61 - 1.24 mg/dL   Calcium 8.5 (L) 8.9 - 10.3 mg/dL   GFR calc non Af Amer 14 (L) >60 mL/min   GFR calc Af Amer 16 (L) >60 mL/min    Comment: (NOTE) The eGFR has been calculated using the CKD EPI equation. This calculation has not been validated in all clinical situations. eGFR's persistently <60 mL/min signify possible Chronic Kidney Disease.    Anion gap 9 5 - 15  Glucose, capillary     Status: Abnormal   Collection Time: 05/08/17  8:25 AM  Result Value Ref  Range   Glucose-Capillary 162 (H) 65 - 99 mg/dL  APTT     Status: Abnormal   Collection Time: 05/08/17 12:52 PM  Result Value Ref Range   aPTT 77 (H) 24 - 36 seconds    Comment:        IF BASELINE aPTT IS ELEVATED, SUGGEST PATIENT RISK ASSESSMENT BE USED TO DETERMINE APPROPRIATE ANTICOAGULANT THERAPY.   Glucose, capillary     Status: Abnormal   Collection Time: 05/08/17 12:52 PM  Result Value Ref Range   Glucose-Capillary 193 (H) 65 - 99 mg/dL  Glucose, capillary     Status: Abnormal   Collection Time: 05/08/17  1:15 PM  Result Value Ref Range   Glucose-Capillary 173 (H) 65 - 99 mg/dL  Glucose, capillary     Status: Abnormal   Collection Time: 05/08/17  4:49 PM  Result Value Ref Range   Glucose-Capillary 169 (H) 65 - 99 mg/dL  Glucose, capillary     Status: Abnormal   Collection Time: 05/08/17  9:34 PM  Result Value Ref Range   Glucose-Capillary 158 (H) 65 - 99 mg/dL  CBC     Status: Abnormal   Collection Time: 05/09/17  6:08 AM  Result Value Ref Range   WBC 13.4 (H) 4.0 - 10.5 K/uL   RBC 3.74 (L) 4.22 - 5.81 MIL/uL   Hemoglobin 11.3 (L) 13.0 - 17.0 g/dL   HCT 33.7 (L) 39.0 - 52.0 %   MCV 90.1 78.0 - 100.0 fL   MCH  30.2 26.0 - 34.0 pg   MCHC 33.5 30.0 - 36.0 g/dL   RDW 14.6 11.5 - 15.5 %   Platelets 195 150 - 400 K/uL  APTT     Status: Abnormal   Collection Time: 05/09/17  6:08 AM  Result Value Ref Range   aPTT 67 (H) 24 - 36 seconds    Comment:        IF BASELINE aPTT IS ELEVATED, SUGGEST PATIENT RISK ASSESSMENT BE USED TO DETERMINE APPROPRIATE ANTICOAGULANT THERAPY.   Basic metabolic panel     Status: Abnormal   Collection Time: 05/09/17  6:08 AM  Result Value Ref Range   Sodium 140 135 - 145 mmol/L   Potassium 5.0 3.5 - 5.1 mmol/L   Chloride 114 (H) 101 - 111 mmol/L   CO2 17 (L) 22 - 32 mmol/L   Glucose, Bld 169 (H) 65 - 99 mg/dL   BUN 95 (H) 6 - 20 mg/dL   Creatinine, Ser 4.64 (H) 0.61 - 1.24 mg/dL   Calcium 8.4 (L) 8.9 - 10.3 mg/dL   GFR calc non Af  Amer 11 (L) >60 mL/min   GFR calc Af Amer 12 (L) >60 mL/min    Comment: (NOTE) The eGFR has been calculated using the CKD EPI equation. This calculation has not been validated in all clinical situations. eGFR's persistently <60 mL/min signify possible Chronic Kidney Disease.    Anion gap 9 5 - 15  Glucose, capillary     Status: Abnormal   Collection Time: 05/09/17  7:39 AM  Result Value Ref Range   Glucose-Capillary 177 (H) 65 - 99 mg/dL  Protime-INR     Status: Abnormal   Collection Time: 05/09/17 10:38 AM  Result Value Ref Range   Prothrombin Time 15.5 (H) 11.4 - 15.2 seconds   INR 1.22   APTT     Status: None   Collection Time: 05/09/17 10:38 AM  Result Value Ref Range   aPTT 34 24 - 36 seconds    Imaging: Dg Retrograde Pyelogram  Result Date: 05/07/2017 CLINICAL DATA:  Nephrolithiasis EXAM: RETROGRADE PYELOGRAM; DG C-ARM 61-120 MIN COMPARISON:  CT abdomen and pelvis Apr 14, 2017 FLUOROSCOPY TIME:  2 minutes 38 seconds; 7 acquired images FINDINGS: Retrograde filling of both ureters and collecting systems was performed intraoperatively. On the right, there is dilatation of the distal right ureter with an apparent stricture in the ureter at the mid sacral level. There is a rather abrupt band in the proximal right ureter just distal to the right ureteropelvic junction. No calculus is seen on this study on the right. On the left, there is a filling defect in the proximal left ureter consistent with a calculus. There is also a calculus in a lower pole calyx on the left. There is fullness of the left renal pelvis. Double-J stents were subsequently placed bilaterally. IMPRESSION: Calculi in the proximal left ureter in lower pole left renal calyx with double-J stent subsequently placed on the left. On the right, apparent stricture in the right ureter at the mid sacral level. Abrupt turn with possible kink in the proximal right ureter with double-J stent placed on the right. Electronically  Signed   By: Lowella Grip III M.D.   On: 05/07/2017 15:14   US Renal  Result Date: 05/08/2017 CLINICAL DATA:  Hydronephrosis.  History of prostate cancer. EXAM: RENAL / URINARY TRACT ULTRASOUND COMPLETE COMPARISON:  Ultrasound of May 06, 2017. FINDINGS: Right Kidney: Length: 12.9 cm. Echogenicity within normal limits. Right ureteral  stent is not well visualized. No mass visualized. Moderate hydronephrosis is noted. Left Kidney: Length: 14.3 cm. Echogenicity within normal limits. No mass visualized. Moderate hydronephrosis is noted. Left ureteral stent is noted. Bladder: Foley catheter is noted in urinary bladder. Bladder appears to be otherwise normal for degree of distension. IMPRESSION: Moderate bilateral hydronephrosis is noted. Left ureteral stent is noted. Right ureteral stent is not well visualized. Electronically Signed   By: Marijo Conception, M.D.   On: 05/08/2017 12:29   Dg C-arm 1-60 Min  Result Date: 05/07/2017 CLINICAL DATA:  Nephrolithiasis EXAM: RETROGRADE PYELOGRAM; DG C-ARM 61-120 MIN COMPARISON:  CT abdomen and pelvis Apr 14, 2017 FLUOROSCOPY TIME:  2 minutes 38 seconds; 7 acquired images FINDINGS: Retrograde filling of both ureters and collecting systems was performed intraoperatively. On the right, there is dilatation of the distal right ureter with an apparent stricture in the ureter at the mid sacral level. There is a rather abrupt band in the proximal right ureter just distal to the right ureteropelvic junction. No calculus is seen on this study on the right. On the left, there is a filling defect in the proximal left ureter consistent with a calculus. There is also a calculus in a lower pole calyx on the left. There is fullness of the left renal pelvis. Double-J stents were subsequently placed bilaterally. IMPRESSION: Calculi in the proximal left ureter in lower pole left renal calyx with double-J stent subsequently placed on the left. On the right, apparent stricture in the right  ureter at the mid sacral level. Abrupt turn with possible kink in the proximal right ureter with double-J stent placed on the right. Electronically Signed   By: Lowella Grip III M.D.   On: 05/07/2017 15:14    Assessment/Plan 1. Bilateral hydronephrosis  Dr. Earleen Newport and Dr. Junious Silk have discussed this case.  We will plan to place bilateral PCNs today for worsening hydronephrosis and renal failure.  The patient is NPO and his heparin drip has been off since 0745 this am.  The patient's other labs and vitals have been reviewed.  He is already on doxycycline for his current UTI, but this is oral.  We will give him an IV prophylactic dose today before the procedure.  Risks and Benefits discussed with the patient including, but not limited to infection, bleeding, significant bleeding causing loss or decrease in renal function or damage to adjacent structures.  All of the patient's questions were answered, patient is agreeable to proceed. Consent signed and in chart.  Thank you for this interesting consult.  I greatly enjoyed meeting William [name] and look forward to participating in their care.  A copy of this report was sent to the requesting provider on this date.  Electronically Signed: Henreitta Cea 05/09/2017, 11:33 AM   I spent a total of 40 Minutes    in face to face in clinical consultation, greater than 50% of which was counseling/coordinating care for bilateral hydronephrosis

## 2017-05-09 NOTE — Progress Notes (Signed)
PROGRESS NOTE    LENVIL SWAIM  URK:[id number] DOB: 19-Feb-1934 DOA: 05/06/2017 PCP: Jani Gravel, MD     Brief Narrative:  De Jaworski [name] an 83 y.o.malewith a history of AFib on eliquis, CVA in Nov 2017, HTN, IDDM, prostate CA, andOA s/p bilateral TKA who was brought to the ED by his family on 6/9 due to progressive generalized weakness, worsening in the past few weeks, leading to recurrent falls. Workup revealed acute kidney injury, leukocytosis with evidence of UTI, bilateral hydronephrosis on renal ultrasound. Urology was consulted and patient underwent ureteral stent placement on 6/10.   Assessment & Plan:   Principal Problem:   Acute lower UTI Active Problems:   IDDM (insulin dependent diabetes mellitus) ([place])   Prostate cancer (HCC)   Sleep apnea   Acute renal failure (ARF) (HCC)   Occult GI bleeding   History of prostate cancer   Hydronephrosis, bilateral   AKI (acute kidney injury) ([place])   Diabetes mellitus type 2 in nonobese (HCC)   Atrial fibrillation (HCC)   Benign essential HTN   Leukocytosis   Acute blood loss anemia   Hydronephrosis  Acute renal failure with bilateral hydronephrosis and UTI: Suspect chronic element to kidney disease with HTN, DM, but acutely worsened due to postrenal obstruction. FENa 0.68%. No uremic symptoms at this time. - Ureteral stents placed 6/10. Op note states bladder was edematous, neovascularized, bleeding easily with extremely limited capacity (~30cc), explaining nocturia/frequency and hematuria.  - Repeat renal US 6/11 with persistent moderate bilateral hydronephrosis, right ureteral stent not well visualized  - Spoke with Dr. Junious Silk, patient to proceed with bilateral nephrostomy tubes due to ureteral stent failure and worsening renal failure. IR consulted. Heparin held.   Coag negative staph UTI - Blood cultures negative to date  - Continue doxycycline  Weakness, falls: No syncope. Suspect due subacutely to deconditioning  post-CVA, with low grade uremia contributing and acutely worsened by infection. TSH wnl. - PT consult   History of prostate CA s/p IMRT Sep 2012: With increased LUTS, UTI, unintentional weight loss and reported painless hematuria.  - Repeat PSA pending (<0.06 May 2016)  Occult GI bleeding: +FOBT in the setting of NOAC + renal failure. Fortunately no anemia on admission. With h/o radiation proctitis, last colonoscopy by [facility] reportedly negative, no f/u recommended at that time.  - Discussed risks/benefits of holding anticoagulation in this setting, will give heparin for now.  - No indication for urgent work up. Will follow up with Dr. Ardis Hughs  IDT2DM: HbA1c 6.8% in 2017, controlled for age. - Hold levemir for now with poor po - SSI, titrate long-acting as needed  Atrial flutter - Holding eliquis/heparin pending IR procedure  - Currently NSR   Essential HTN - Holding lisinopril and thiazide diuretic due to renal failure - Hydralazine prn ordered for severe BP elevations  M-spike: Reported - Hematology referral to Dr. Beryle Beams as outpatient in progress  OSA: - Does not tolerate PAP qHS   DVT prophylaxis: hold anticoag pending procedure  Code Status: Full Family Communication: Family at bedside Disposition Plan: Pending nephrostomy tube placement    Consultants:   Urology Dr. Junious Silk  Nephrology Dr. Archie Balboa  IR   Procedures:   Cystoscopy and ureteral stents placement 05/07/2017 by Dr. Junious Silk  Antimicrobials:  Anti-infectives    Start     Dose/Rate Route Frequency Ordered Stop   05/08/17 1500  doxycycline (VIBRA-TABS) tablet 100 mg     100 mg Oral Every 12 hours 05/08/17 1427  05/06/17 2200  cefTRIAXone (ROCEPHIN) 1 g in dextrose 5 % 50 mL IVPB     1 g 100 mL/hr over 30 Minutes Intravenous Every 24 hours 05/06/17 2029 05/07/17 2208         Subjective: Patient in good spirits today. Has no complaints. He had episode of sundowning and  agitation overnight. Currently, he denies any pain, chest pain or abdominal pain. Was able to tolerate some food yesterday despite his lack of appetite.   Objective: Vitals:   05/08/17 1651 05/08/17 2137 05/09/17 0452 05/09/17 1002  BP: (!) 165/71 (!) 143/100 (!) 157/61 (!) 163/80  Pulse: 74 77 73 79  Resp: 17 17 17 17   Temp: 97.7 F (36.5 C) 98 F (36.7 C) 98 F (36.7 C) 98 F (36.7 C)  TempSrc: Oral Oral Oral Oral  SpO2: 100% 99% 96% 96%  Weight:    94.1 kg (207 lb 6.4 oz)  Height:        Intake/Output Summary (Last 24 hours) at 05/09/17 1227 Last data filed at 05/09/17 1211  Gross per 24 hour  Intake            491.8 ml  Output              575 ml  Net            -83.2 ml   Filed Weights   05/07/17 2101 05/08/17 0500 05/09/17 1002  Weight: 91.9 kg (202 lb 8 oz) 91.9 kg (202 lb 9.6 oz) 94.1 kg (207 lb 6.4 oz)    Examination:  General exam: Appears calm and comfortable  Respiratory system: Clear to auscultation. Respiratory effort normal. Cardiovascular system: S1 & S2 heard, RRR. No JVD, murmurs, rubs, gallops or clicks. No pedal edema. Gastrointestinal system: Abdomen is nondistended, soft and nontender. No organomegaly or masses felt. Normal bowel sounds heard. Central nervous system: Alert and oriented. No focal neurological deficits. Extremities: Symmetric 5 x 5 power. Skin: No rashes, lesions or ulcers Psychiatry: Judgement and insight appear normal. Mood & affect appropriate.   Data Reviewed: I have personally reviewed following labs and imaging studies  CBC:  Recent Labs Lab 05/06/17 1224 05/07/17 0403 05/08/17 0442 05/09/17 0608  WBC 15.6* 12.6* 11.9* 13.4*  HGB 13.7 11.7* 11.8* 11.3*  HCT 40.3 34.5* 35.7* 33.7*  MCV 90.0 89.4 90.6 90.1  PLT 243 217 202 010   Basic Metabolic Panel:  Recent Labs Lab 05/06/17 2040 05/07/17 0403 05/07/17 2038 05/08/17 0442 05/09/17 0608  NA 133* 136 140 139 140  K 5.2* 5.4* 3.9 4.8 5.0  CL 105 106 116* 112*  114*  CO2 16* 20* 17* 18* 17*  GLUCOSE 118* 155* 177* 163* 169*  BUN 90* 92* 74* 86* 95*  CREATININE 5.00* 5.01* 3.35* 3.83* 4.64*  CALCIUM 8.8* 8.8* 6.8* 8.5* 8.4*   GFR: Estimated Creatinine Clearance: 14.3 mL/min (A) (by C-G formula based on SCr of 4.64 mg/dL (H)). Liver Function Tests: No results for input(s): AST, ALT, ALKPHOS, BILITOT, PROT, ALBUMIN in the last 168 hours. No results for input(s): LIPASE, AMYLASE in the last 168 hours. No results for input(s): AMMONIA in the last 168 hours. Coagulation Profile:  Recent Labs Lab 05/09/17 1038  INR 1.22   Cardiac Enzymes: No results for input(s): CKTOTAL, CKMB, CKMBINDEX, TROPONINI in the last 168 hours. BNP (last 3 results) No results for input(s): PROBNP in the last 8760 hours. HbA1C: No results for input(s): HGBA1C in the last 72 hours. CBG:  Recent Labs Lab  05/08/17 1315 05/08/17 1649 05/08/17 2134 05/09/17 0739 05/09/17 1202  GLUCAP 173* 169* 158* 177* 165*   Lipid Profile: No results for input(s): CHOL, HDL, LDLCALC, TRIG, CHOLHDL, LDLDIRECT in the last 72 hours. Thyroid Function Tests:  Recent Labs  05/06/17 2040  TSH 4.424   Anemia Panel: No results for input(s): VITAMINB12, FOLATE, FERRITIN, TIBC, IRON, RETICCTPCT in the last 72 hours. Sepsis Labs: No results for input(s): PROCALCITON, LATICACIDVEN in the last 168 hours.  Recent Results (from the past 240 hour(s))  Urine Culture     Status: Abnormal   Collection Time: 05/06/17  4:43 PM  Result Value Ref Range Status   Specimen Description URINE, RANDOM  Final   Special Requests NONE  Final   Culture (A)  Final    50,000 COLONIES/mL STAPHYLOCOCCUS SPECIES (COAGULASE NEGATIVE)   Report Status 05/08/2017 FINAL  Final   Organism ID, Bacteria STAPHYLOCOCCUS SPECIES (COAGULASE NEGATIVE) (A)  Final      Susceptibility   Staphylococcus species (coagulase negative) - MIC*    CIPROFLOXACIN >=8 RESISTANT Resistant     GENTAMICIN <=0.5 SENSITIVE  Sensitive     NITROFURANTOIN <=16 SENSITIVE Sensitive     OXACILLIN >=4 RESISTANT Resistant     TETRACYCLINE <=1 SENSITIVE Sensitive     VANCOMYCIN <=0.5 SENSITIVE Sensitive     TRIMETH/SULFA <=10 SENSITIVE Sensitive     CLINDAMYCIN >=8 RESISTANT Resistant     RIFAMPIN <=0.5 SENSITIVE Sensitive     Inducible Clindamycin NEGATIVE Sensitive     * 50,000 COLONIES/mL STAPHYLOCOCCUS SPECIES (COAGULASE NEGATIVE)  Culture, blood (routine x 2)     Status: None (Preliminary result)   Collection Time: 05/06/17  4:53 PM  Result Value Ref Range Status   Specimen Description BLOOD RIGHT FOREARM  Final   Special Requests   Final    BOTTLES DRAWN AEROBIC AND ANAEROBIC Blood Culture adequate volume   Culture NO GROWTH 2 DAYS  Final   Report Status PENDING  Incomplete  Culture, blood (routine x 2)     Status: None (Preliminary result)   Collection Time: 05/06/17  5:10 PM  Result Value Ref Range Status   Specimen Description BLOOD LEFT ANTECUBITAL  Final   Special Requests   Final    BOTTLES DRAWN AEROBIC AND ANAEROBIC Blood Culture adequate volume   Culture NO GROWTH 2 DAYS  Final   Report Status PENDING  Incomplete  Surgical pcr screen     Status: None   Collection Time: 05/07/17 10:31 AM  Result Value Ref Range Status   MRSA, PCR NEGATIVE NEGATIVE Final   Staphylococcus aureus NEGATIVE NEGATIVE Final    Comment:        The Xpert SA Assay (FDA approved for NASAL specimens in patients over 47 years of age), is one component of a comprehensive surveillance program.  Test performance has been validated by [facility] for patients greater than or equal to 25 year old. It is not intended to diagnose infection nor to guide or monitor treatment.        Radiology Studies: Dg Retrograde Pyelogram  Result Date: 05/07/2017 CLINICAL DATA:  Nephrolithiasis EXAM: RETROGRADE PYELOGRAM; DG C-ARM 61-120 MIN COMPARISON:  CT abdomen and pelvis Apr 14, 2017 FLUOROSCOPY TIME:  2 minutes 38 seconds; 7  acquired images FINDINGS: Retrograde filling of both ureters and collecting systems was performed intraoperatively. On the right, there is dilatation of the distal right ureter with an apparent stricture in the ureter at the mid sacral level. There is a rather  abrupt band in the proximal right ureter just distal to the right ureteropelvic junction. No calculus is seen on this study on the right. On the left, there is a filling defect in the proximal left ureter consistent with a calculus. There is also a calculus in a lower pole calyx on the left. There is fullness of the left renal pelvis. Double-J stents were subsequently placed bilaterally. IMPRESSION: Calculi in the proximal left ureter in lower pole left renal calyx with double-J stent subsequently placed on the left. On the right, apparent stricture in the right ureter at the mid sacral level. Abrupt turn with possible kink in the proximal right ureter with double-J stent placed on the right. Electronically Signed   By: Lowella Grip III M.D.   On: 05/07/2017 15:14   US Renal  Result Date: 05/08/2017 CLINICAL DATA:  Hydronephrosis.  History of prostate cancer. EXAM: RENAL / URINARY TRACT ULTRASOUND COMPLETE COMPARISON:  Ultrasound of May 06, 2017. FINDINGS: Right Kidney: Length: 12.9 cm. Echogenicity within normal limits. Right ureteral stent is not well visualized. No mass visualized. Moderate hydronephrosis is noted. Left Kidney: Length: 14.3 cm. Echogenicity within normal limits. No mass visualized. Moderate hydronephrosis is noted. Left ureteral stent is noted. Bladder: Foley catheter is noted in urinary bladder. Bladder appears to be otherwise normal for degree of distension. IMPRESSION: Moderate bilateral hydronephrosis is noted. Left ureteral stent is noted. Right ureteral stent is not well visualized. Electronically Signed   By: Marijo Conception, M.D.   On: 05/08/2017 12:29   Dg C-arm 1-60 Min  Result Date: 05/07/2017 CLINICAL DATA:   Nephrolithiasis EXAM: RETROGRADE PYELOGRAM; DG C-ARM 61-120 MIN COMPARISON:  CT abdomen and pelvis Apr 14, 2017 FLUOROSCOPY TIME:  2 minutes 38 seconds; 7 acquired images FINDINGS: Retrograde filling of both ureters and collecting systems was performed intraoperatively. On the right, there is dilatation of the distal right ureter with an apparent stricture in the ureter at the mid sacral level. There is a rather abrupt band in the proximal right ureter just distal to the right ureteropelvic junction. No calculus is seen on this study on the right. On the left, there is a filling defect in the proximal left ureter consistent with a calculus. There is also a calculus in a lower pole calyx on the left. There is fullness of the left renal pelvis. Double-J stents were subsequently placed bilaterally. IMPRESSION: Calculi in the proximal left ureter in lower pole left renal calyx with double-J stent subsequently placed on the left. On the right, apparent stricture in the right ureter at the mid sacral level. Abrupt turn with possible kink in the proximal right ureter with double-J stent placed on the right. Electronically Signed   By: Lowella Grip III M.D.   On: 05/07/2017 15:14      Scheduled Meds: . doxycycline  100 mg Oral Q12H  . insulin aspart  0-9 Units Subcutaneous TID WC  . mouth rinse  15 mL Mouth Rinse BID  . pravastatin  40 mg Oral Q supper  . sodium bicarbonate  1,300 mg Oral TID  . sodium chloride flush  3 mL Intravenous Q12H   Continuous Infusions:    LOS: 3 days    Time spent: 30 minutes   Dessa Phi, DO [facility] [email or web address] Password [facility] 05/09/2017, 12:27 PM

## 2017-05-09 NOTE — Anesthesia Postprocedure Evaluation (Signed)
Anesthesia Post Note  Patient: CORDELRO GAUTREAU  Procedure(s) Performed: Procedure(s) (LRB): CYSTOSCOPY WITH BILATERAL RETROGRADE PYELOGRAM CYSTOGRAM BILATERAL URETERAL STENT PLACEMENT (Bilateral)     Patient location during evaluation: PACU Anesthesia Type: General Level of consciousness: awake Pain management: pain level controlled Vital Signs Assessment: post-procedure vital signs reviewed and stable Respiratory status: spontaneous breathing, nonlabored ventilation, respiratory function stable and patient connected to nasal cannula oxygen Cardiovascular status: stable Postop Assessment: no signs of nausea or vomiting Anesthetic complications: no    Last Vitals:  Vitals:   05/08/17 2137 05/09/17 0452  BP: (!) 143/100 (!) 157/61  Pulse: 77 73  Resp: 17 17  Temp: 36.7 C 36.7 C    Last Pain:  Vitals:   05/09/17 0452  TempSrc: Oral  PainSc:                  Alexcia Schools

## 2017-05-09 NOTE — Progress Notes (Signed)
[name] for heparin Indication: atrial fibrillation  Allergies  Allergen Reactions  . Glyburide Other (See Comments)    Hypoglycemia     Patient Measurements: Height: 6\' 1"  (185.4 cm) Weight: 213 lb 3 oz (96.7 kg) IBW/kg (Calculated) : 79.9  Vital Signs: Temp: 97.9 F (36.6 C) (06/12 1900) Temp Source: Oral (06/12 1900) BP: 148/72 (06/12 1900) Pulse Rate: 78 (06/12 1900)  Labs:  Recent Labs  05/07/17 0403  05/07/17 1524  05/08/17 0142 05/08/17 0442  05/09/17 0608 05/09/17 1038 05/09/17 1531 05/09/17 2235  HGB 11.7*  --   --   --   --  11.8*  --  11.3*  --   --   --   HCT 34.5*  --   --   --   --  35.7*  --  33.7*  --   --   --   PLT 217  --   --   --   --  202  --  195  --   --   --   APTT 111*  --  41*  --  80*  --   < > 67* 34  --  72*  LABPROT  --   --   --   --   --   --   --   --  15.5*  --   --   INR  --   --   --   --   --   --   --   --  1.22  --   --   HEPARINUNFRC  --   < > 1.27*  --  0.95*  --   --   --   --   --  0.33  CREATININE 5.01*  --   --   < >  --  3.83*  --  4.64*  --  4.95*  --   < > = values in this interval not displayed.  Estimated Creatinine Clearance: 14.6 mL/min (A) (by C-G formula based on SCr of 4.95 mg/dL (H)).   Assessment: 83 y/o Male with h/o Afib, Eliquis on hold, for heparin  Goal of Therapy:  Heparin level 0.3-0.7 units/ml aPTT 66-102 seconds Monitor platelets by anticoagulation protocol: Yes   Plan:  Continue Heparin at current rate  William [name], PharmD, BCPS 05/09/2017 11:52 PM

## 2017-05-09 NOTE — Progress Notes (Addendum)
2 Days Post-Op   Assessment/Plan: -bilateral hydro - UOP decreased, pt feeling poorly, Cr rising - pt clearly failing ureteral stent trial. IR consulted for Perc Nx, bilateral. He is on heparin gtt, so cessation of heparin will need to be coordinated with hospitalist. I did d/c foley this AM. Bladder is small, non-compliant and hostile. Very difficult to get stents up, difficult to get catheter in and drain, pt voiding with around the clock frequency all points to an end stage bladder. In the end, nephrostomy tubes are likely to be the best to divert the urine totally.  -gross hematuria - expected with stents, foley, radiated bladder, heparin gtt. Not clinically significant - H/H stable.  -bacteriuria - on doxycycline   Addedum: I discussed with Dr. Earleen Newport. We discussed the patient's course and imaging to date and not clear at this point if another CT would be of any benefit given the stents should be working. I'll check a KUB to check the stents and for a possible left proximal stone (although the stents should drain the kidney regardless if there is a stone or tumor). Given acute onset of events will plan for bilateral percutaneous neprhostomy.     Subjective: Patient reports feels bad. Moaning. Back to status at admission.   Objective: Vital signs in last 24 hours: Temp:  [97.4 F (36.3 C)-98 F (36.7 C)] 98 F (36.7 C) (06/12 0452) Pulse Rate:  [73-77] 73 (06/12 0452) Resp:  [17] 17 (06/12 0452) BP: (143-165)/(61-100) 157/61 (06/12 0452) SpO2:  [96 %-100 %] 96 % (06/12 0452)  Intake/Output from previous day: 06/11 0701 - 06/12 0700 In: 611.8 [P.O.:480; I.V.:131.8] Out: 825 [Urine:825] Intake/Output this shift: No intake/output data recorded.  Physical Exam:  NAD In bed moaning, sighing  Abd - soft, NT Bladder not distended Some urine in foley, actually clear at top of tubing -- only ~100 mL in bag  I flushed with foley with 10 mL of NS and pt already feeling pain indicative  of his low bladder capacity, non-compliance. I removed the foley as the balloon might be putting pressure on the stents.   Lab Results:  Recent Labs  05/07/17 0403 05/08/17 0442 05/09/17 0608  HGB 11.7* 11.8* 11.3*  HCT 34.5* 35.7* 33.7*   BMET  Recent Labs  05/08/17 0442 05/09/17 0608  NA 139 140  K 4.8 5.0  CL 112* 114*  CO2 18* 17*  GLUCOSE 163* 169*  BUN 86* 95*  CREATININE 3.83* 4.64*  CALCIUM 8.5* 8.4*   No results for input(s): LABPT, INR in the last 72 hours. No results for input(s): LABURIN in the last 72 hours. Results for orders placed or performed during the hospital encounter of 05/06/17  Urine Culture     Status: Abnormal   Collection Time: 05/06/17  4:43 PM  Result Value Ref Range Status   Specimen Description URINE, RANDOM  Final   Special Requests NONE  Final   Culture (A)  Final    50,000 COLONIES/mL STAPHYLOCOCCUS SPECIES (COAGULASE NEGATIVE)   Report Status 05/08/2017 FINAL  Final   Organism ID, Bacteria STAPHYLOCOCCUS SPECIES (COAGULASE NEGATIVE) (A)  Final      Susceptibility   Staphylococcus species (coagulase negative) - MIC*    CIPROFLOXACIN >=8 RESISTANT Resistant     GENTAMICIN <=0.5 SENSITIVE Sensitive     NITROFURANTOIN <=16 SENSITIVE Sensitive     OXACILLIN >=4 RESISTANT Resistant     TETRACYCLINE <=1 SENSITIVE Sensitive     VANCOMYCIN <=0.5 SENSITIVE Sensitive  TRIMETH/SULFA <=10 SENSITIVE Sensitive     CLINDAMYCIN >=8 RESISTANT Resistant     RIFAMPIN <=0.5 SENSITIVE Sensitive     Inducible Clindamycin NEGATIVE Sensitive     * 50,000 COLONIES/mL STAPHYLOCOCCUS SPECIES (COAGULASE NEGATIVE)  Culture, blood (routine x 2)     Status: None (Preliminary result)   Collection Time: 05/06/17  4:53 PM  Result Value Ref Range Status   Specimen Description BLOOD RIGHT FOREARM  Final   Special Requests   Final    BOTTLES DRAWN AEROBIC AND ANAEROBIC Blood Culture adequate volume   Culture NO GROWTH 2 DAYS  Final   Report Status PENDING   Incomplete  Culture, blood (routine x 2)     Status: None (Preliminary result)   Collection Time: 05/06/17  5:10 PM  Result Value Ref Range Status   Specimen Description BLOOD LEFT ANTECUBITAL  Final   Special Requests   Final    BOTTLES DRAWN AEROBIC AND ANAEROBIC Blood Culture adequate volume   Culture NO GROWTH 2 DAYS  Final   Report Status PENDING  Incomplete  Surgical pcr screen     Status: None   Collection Time: 05/07/17 10:31 AM  Result Value Ref Range Status   MRSA, PCR NEGATIVE NEGATIVE Final   Staphylococcus aureus NEGATIVE NEGATIVE Final    Comment:        The Xpert SA Assay (FDA approved for NASAL specimens in patients over 59 years of age), is one component of a comprehensive surveillance program.  Test performance has been validated by [facility] for patients greater than or equal to 10 year old. It is not intended to diagnose infection nor to guide or monitor treatment.     Studies/Results: Dg Retrograde Pyelogram  Result Date: 05/07/2017 CLINICAL DATA:  Nephrolithiasis EXAM: RETROGRADE PYELOGRAM; DG C-ARM 61-120 MIN COMPARISON:  CT abdomen and pelvis Apr 14, 2017 FLUOROSCOPY TIME:  2 minutes 38 seconds; 7 acquired images FINDINGS: Retrograde filling of both ureters and collecting systems was performed intraoperatively. On the right, there is dilatation of the distal right ureter with an apparent stricture in the ureter at the mid sacral level. There is a rather abrupt band in the proximal right ureter just distal to the right ureteropelvic junction. No calculus is seen on this study on the right. On the left, there is a filling defect in the proximal left ureter consistent with a calculus. There is also a calculus in a lower pole calyx on the left. There is fullness of the left renal pelvis. Double-J stents were subsequently placed bilaterally. IMPRESSION: Calculi in the proximal left ureter in lower pole left renal calyx with double-J stent subsequently placed on  the left. On the right, apparent stricture in the right ureter at the mid sacral level. Abrupt turn with possible kink in the proximal right ureter with double-J stent placed on the right. Electronically Signed   By: Lowella Grip III M.D.   On: 05/07/2017 15:14   US Renal  Result Date: 05/08/2017 CLINICAL DATA:  Hydronephrosis.  History of prostate cancer. EXAM: RENAL / URINARY TRACT ULTRASOUND COMPLETE COMPARISON:  Ultrasound of May 06, 2017. FINDINGS: Right Kidney: Length: 12.9 cm. Echogenicity within normal limits. Right ureteral stent is not well visualized. No mass visualized. Moderate hydronephrosis is noted. Left Kidney: Length: 14.3 cm. Echogenicity within normal limits. No mass visualized. Moderate hydronephrosis is noted. Left ureteral stent is noted. Bladder: Foley catheter is noted in urinary bladder. Bladder appears to be otherwise normal for degree of distension. IMPRESSION:  Moderate bilateral hydronephrosis is noted. Left ureteral stent is noted. Right ureteral stent is not well visualized. Electronically Signed   By: Marijo Conception, M.D.   On: 05/08/2017 12:29   Dg C-arm 1-60 Min  Result Date: 05/07/2017 CLINICAL DATA:  Nephrolithiasis EXAM: RETROGRADE PYELOGRAM; DG C-ARM 61-120 MIN COMPARISON:  CT abdomen and pelvis Apr 14, 2017 FLUOROSCOPY TIME:  2 minutes 38 seconds; 7 acquired images FINDINGS: Retrograde filling of both ureters and collecting systems was performed intraoperatively. On the right, there is dilatation of the distal right ureter with an apparent stricture in the ureter at the mid sacral level. There is a rather abrupt band in the proximal right ureter just distal to the right ureteropelvic junction. No calculus is seen on this study on the right. On the left, there is a filling defect in the proximal left ureter consistent with a calculus. There is also a calculus in a lower pole calyx on the left. There is fullness of the left renal pelvis. Double-J stents were  subsequently placed bilaterally. IMPRESSION: Calculi in the proximal left ureter in lower pole left renal calyx with double-J stent subsequently placed on the left. On the right, apparent stricture in the right ureter at the mid sacral level. Abrupt turn with possible kink in the proximal right ureter with double-J stent placed on the right. Electronically Signed   By: Lowella Grip III M.D.   On: 05/07/2017 15:14      LOS: 3 days   Quadir Muns 05/09/2017, 7:57 AM

## 2017-05-10 DIAGNOSIS — E43 Unspecified severe protein-calorie malnutrition: Secondary | ICD-10-CM | POA: Insufficient documentation

## 2017-05-10 LAB — BASIC METABOLIC PANEL
Anion gap: 12 (ref 5–15)
Anion gap: 13 (ref 5–15)
BUN: 90 mg/dL — AB (ref 6–20)
BUN: 76 mg/dL — AB (ref 6–20)
CALCIUM: 8.8 mg/dL — AB (ref 8.9–10.3)
CALCIUM: 8.7 mg/dL — AB (ref 8.9–10.3)
CHLORIDE: 111 mmol/L (ref 101–111)
CO2: 16 mmol/L — ABNORMAL LOW (ref 22–32)
CO2: 18 mmol/L — AB (ref 22–32)
CREATININE: 3.23 mg/dL — AB (ref 0.61–1.24)
Chloride: 105 mmol/L (ref 101–111)
Creatinine, Ser: 5 mg/dL — ABNORMAL HIGH (ref 0.61–1.24)
GFR calc Af Amer: 11 mL/min — ABNORMAL LOW (ref >60)
GFR calc Af Amer: 19 mL/min — ABNORMAL LOW (ref >60)
GFR calc non Af Amer: 10 mL/min — ABNORMAL LOW (ref >60)
GFR calc non Af Amer: 17 mL/min — ABNORMAL LOW (ref >60)
GLUCOSE: 118 mg/dL — AB (ref 65–99)
GLUCOSE: 133 mg/dL — AB (ref 65–99)
Potassium: 5.2 mmol/L — ABNORMAL HIGH (ref 3.5–5.1)
Potassium: 4.6 mmol/L (ref 3.5–5.1)
Sodium: 133 mmol/L — ABNORMAL LOW (ref 135–145)
Sodium: 142 mmol/L (ref 135–145)

## 2017-05-10 LAB — CBC
HCT: 38.1 % — ABNORMAL LOW (ref 39.0–52.0)
Hemoglobin: 12.6 g/dL — ABNORMAL LOW (ref 13.0–17.0)
MCH: 30.4 pg (ref 26.0–34.0)
MCHC: 33.1 g/dL (ref 30.0–36.0)
MCV: 91.8 fL (ref 78.0–100.0)
PLATELETS: 199 10*3/uL (ref 150–400)
RBC: 4.15 MIL/uL — ABNORMAL LOW (ref 4.22–5.81)
RDW: 14.7 % (ref 11.5–15.5)
WBC: 10.3 10*3/uL (ref 4.0–10.5)

## 2017-05-10 LAB — HEPARIN LEVEL (UNFRACTIONATED): Heparin Unfractionated: 0.31 IU/mL (ref 0.30–0.70)

## 2017-05-10 LAB — URINE CULTURE
CULTURE: NO GROWTH
CULTURE: NO GROWTH

## 2017-05-10 LAB — GLUCOSE, CAPILLARY
GLUCOSE-CAPILLARY: 132 mg/dL — AB (ref 65–99)
GLUCOSE-CAPILLARY: 190 mg/dL — AB (ref 65–99)
GLUCOSE-CAPILLARY: 170 mg/dL — AB (ref 65–99)
Glucose-Capillary: 175 mg/dL — ABNORMAL HIGH (ref 65–99)

## 2017-05-10 LAB — PSA: Prostatic Specific Antigen: 0.05 ng/mL (ref 0.00–4.00)

## 2017-05-10 MED ORDER — LORAZEPAM 1 MG PO TABS
1.0000 mg | ORAL_TABLET | Freq: Every day | ORAL | Status: DC
  Administered 2017-05-10: 1 mg via ORAL
  Filled 2017-05-10: qty 1

## 2017-05-10 MED ORDER — LORAZEPAM 1 MG PO TABS
1.0000 mg | ORAL_TABLET | Freq: Once | ORAL | Status: AC
  Administered 2017-05-10: 1 mg via ORAL
  Filled 2017-05-10: qty 1

## 2017-05-10 MED ORDER — RENA-VITE PO TABS
1.0000 | ORAL_TABLET | Freq: Every day | ORAL | Status: DC
  Administered 2017-05-10 – 2017-05-12 (×3): 1 via ORAL
  Filled 2017-05-10 (×4): qty 1

## 2017-05-10 MED ORDER — ACETAMINOPHEN 325 MG PO TABS
650.0000 mg | ORAL_TABLET | Freq: Four times a day (QID) | ORAL | Status: DC | PRN
  Administered 2017-05-10 – 2017-05-11 (×2): 650 mg via ORAL
  Filled 2017-05-10 (×2): qty 2

## 2017-05-10 NOTE — Progress Notes (Signed)
Rehab admissions - I met with patient (who fell asleep) and his 2 daughters and 1 son at the bedside.  Family now would like inpatient rehab admission once patient is medically ready.  I gave family rehab booklets and answered many questions about inpatient rehab.  Family said there was a miscommunication and that they do not want patient to go to a SNF.  I will talk to attending MD to discuss timing of possible inpatient rehab admission.  Call me for questions.  #[phone]

## 2017-05-10 NOTE — Progress Notes (Signed)
Nutrition Education Note  RD consulted for Renal Education. Provided "nutrition with diabetes and chronic renal disease" handout from [employer]. Reviewed food groups and provided written recommended serving sizes specifically determined for patient's current nutritional status.   Explained why diet restrictions are needed and provided lists of foods to limit/avoid that are high potassium, sodium, and phosphorus. Provided specific recommendations on safer alternatives of these foods. Strongly encouraged compliance of this diet.   Discussed importance of protein intake at each meal and snack. Provided examples of how to maximize protein intake throughout the day. Discussed need for fluid restriction with dialysis, importance of minimizing weight gain between HD treatments, and renal-friendly beverage options.  Teach back method used.  Expect fair compliance.  Body mass index is 28.13 kg/m. Pt meets criteria for normal weight for age based on current BMI.  Current diet order is heart healthy/carbohydrate controlled, patient is consuming approximately 25% of meals at this time.   RD following this patient  William Distance MS, RD, LDN Pager #[phone]

## 2017-05-10 NOTE — Progress Notes (Addendum)
PROGRESS NOTE    William [name]  AYT:[id number] DOB: [birth year] DOA: 05/06/2017 PCP: Jani Gravel, MD     Brief Narrative:  William Suder [name] an 83 y.o.malewith a history of AFib on eliquis, CVA in Nov 2017, HTN, IDDM, prostate CA, andOA s/p bilateral TKA who was brought to the ED by his family on 6/9 due to progressive generalized weakness, worsening in the past few weeks, leading to recurrent falls. Workup revealed acute kidney injury, leukocytosis with evidence of UTI, bilateral hydronephrosis on renal ultrasound. Urology was consulted and patient underwent ureteral stent placement on 6/10. Due to lack of improvement in creatinine function, ultrasound of kidneys was repeated which showed ureteral stent failure. Patient ultimately underwent bilateral nephrostomy tube placement by IR on 6/12.  Assessment & Plan:   Principal Problem:   Acute lower UTI Active Problems:   IDDM (insulin dependent diabetes mellitus) ([place])   Prostate cancer (HCC)   Sleep apnea   Acute renal failure (ARF) (HCC)   Occult GI bleeding   History of prostate cancer   Hydronephrosis, bilateral   AKI (acute kidney injury) ([name])   Diabetes mellitus type 2 in nonobese (HCC)   Atrial fibrillation (HCC)   Benign essential HTN   Leukocytosis   Acute blood loss anemia   Hydronephrosis   Protein-calorie malnutrition, severe  Acute renal failure with bilateral hydronephrosis and UTI: Suspect chronic element to kidney disease with HTN, DM, but acutely worsened due to postrenal obstruction. FENa 0.68%. No uremic symptoms at this time. - Ureteral stents placed 6/10. Op note states bladder was edematous, neovascularized, bleeding easily with extremely limited capacity (~30cc), explaining nocturia/frequency and hematuria.  - Repeat renal US 6/11 with persistent moderate bilateral hydronephrosis, right ureteral stent not well visualized  - Bilateral nephrostomy tubes placed 6/12, with urine output - Kidney function with  improvement today  - Nephro and urology following   Coag negative staph UTI - Blood cultures negative to date  - Continue doxycycline   Weakness, falls: No syncope. Suspect due subacutely to deconditioning post-CVA, with low grade uremia contributing and acutely worsened by infection. TSH wnl. - PT consult, recommending CIR eval   History of prostate CA s/p IMRT Sep 2012: With increased LUTS, UTI, unintentional weight loss and reported painless hematuria.  - Repeat PSA 0.05   Occult GI bleeding: +FOBT in the setting of NOAC + renal failure. Fortunately no anemia on admission. With h/o radiation proctitis, last colonoscopy by [facility] reportedly negative, no f/u recommended at that time.  - Discussed risks/benefits of holding anticoagulation in this setting, will give anticoag for now  - No indication for urgent work up. Will follow up with Dr. Ardis Hughs  IDT2DM: HbA1c 6.8% in 2017, controlled for age. - Hold levemir for now with poor po - SSI, titrate long-acting as needed  Paroxysmal atrial flutter - Currently NSR  - Plan to resume eliquis tomorrow as long as no further procedure planned and Cr continues to improve   Essential HTN - Holding lisinopril and thiazide diuretic due to renal failure - Hydralazine prn ordered for severe BP elevations  Insomnia/sundowning - Ativan PO qhs   M-spike: Reported - Hematology referral to Dr. Beryle Beams as outpatient in progress  OSA: - Does not tolerate PAP qHS  Severe malnutrition in context of chronic illness - Per dietitian   DVT prophylaxis: heparin IV, plan to resume eliquis tomorrow as long as Cr continues to improve and no plans for further procedures  Code Status: Full Family Communication:  Family at bedside Disposition Plan: Pending CIR eval    Consultants:   Urology Dr. Junious Silk  Nephrology Dr. Archie Balboa  IR   Procedures:   Cystoscopy and ureteral stents placement 05/07/2017 by Dr. Junious Silk  Bilateral  nephrostomy tube placement 05/09/2017 by Dr. Earleen Newport   Antimicrobials:  Anti-infectives    Start     Dose/Rate Route Frequency Ordered Stop   05/09/17 1345  ciprofloxacin (CIPRO) IVPB 400 mg    Comments:  Pharmacist called re: this drug and creatinine. Drug approved by pharmacist for one time dose. Dr. Earleen Newport aware.   400 mg 200 mL/hr over 60 Minutes Intravenous To Radiology 05/09/17 1328 05/09/17 1448   05/09/17 1318  ciprofloxacin (CIPRO) 400 MG/200ML IVPB    Comments:  C[place], Jamie   : cabinet override      05/09/17 1318 05/09/17 1348   05/08/17 1500  doxycycline (VIBRA-TABS) tablet 100 mg     100 mg Oral Every 12 hours 05/08/17 1427     05/06/17 2200  cefTRIAXone (ROCEPHIN) 1 g in dextrose 5 % 50 mL IVPB     1 g 100 mL/hr over 30 Minutes Intravenous Every 24 hours 05/06/17 2029 05/07/17 2208         Subjective: Patient in good spirits today. Has no complaints. Has episode of agitation overnight again but after receiving ativan, got rest. No pain.    Objective: Vitals:   05/09/17 1900 05/09/17 2100 05/10/17 0551 05/10/17 1141  BP: (!) 148/72  (!) 181/91 (!) 161/77  Pulse: 78  79 74  Resp: 16  16 16   Temp: 97.9 F (36.6 C)  97.7 F (36.5 C) 97.6 F (36.4 C)  TempSrc: Oral  Oral Oral  SpO2: 98%  97% 98%  Weight:  96.7 kg (213 lb 3 oz)    Height:        Intake/Output Summary (Last 24 hours) at 05/10/17 1230 Last data filed at 05/10/17 1142  Gross per 24 hour  Intake           844.61 ml  Output             4095 ml  Net         -3250.39 ml   Filed Weights   05/08/17 0500 05/09/17 1002 05/09/17 2100  Weight: 91.9 kg (202 lb 9.6 oz) 94.1 kg (207 lb 6.4 oz) 96.7 kg (213 lb 3 oz)    Examination:  General exam: Appears calm and comfortable  Respiratory system: Clear to auscultation. Respiratory effort normal. Cardiovascular system: S1 & S2 heard, RRR. No JVD, murmurs, rubs, gallops or clicks. No pedal edema. Gastrointestinal system: Abdomen is nondistended, soft  and nontender. No organomegaly or masses felt. Normal bowel sounds heard. Central nervous system: Alert and oriented. No focal neurological deficits. GU: bilateral nephrostomy tubes draining urine, condom cath in place  Extremities: Symmetric 5 x 5 power. Skin: No rashes, lesions or ulcers Psychiatry: Judgement and insight appear normal. Mood & affect appropriate.   Data Reviewed: I have personally reviewed following labs and imaging studies  CBC:  Recent Labs Lab 05/06/17 1224 05/07/17 0403 05/08/17 0442 05/09/17 0608 05/10/17 0510  WBC 15.6* 12.6* 11.9* 13.4* 10.3  HGB 13.7 11.7* 11.8* 11.3* 12.6*  HCT 40.3 34.5* 35.7* 33.7* 38.1*  MCV 90.0 89.4 90.6 90.1 91.8  PLT 243 217 202 195 833   Basic Metabolic Panel:  Recent Labs Lab 05/07/17 2038 05/08/17 0442 05/09/17 0608 05/09/17 1531 05/10/17 0510  NA 140 139 140 141 142  K 3.9 4.8 5.0 5.2* 4.6  CL 116* 112* 114* 113* 111  CO2 17* 18* 17* 14* 18*  GLUCOSE 177* 163* 169* 188* 133*  BUN 74* 86* 95* 99* 76*  CREATININE 3.35* 3.83* 4.64* 4.95* 3.23*  CALCIUM 6.8* 8.5* 8.4* 8.7* 8.7*  PHOS  --   --   --  5.6*  --    GFR: Estimated Creatinine Clearance: 22.3 mL/min (A) (by C-G formula based on SCr of 3.23 mg/dL (H)). Liver Function Tests:  Recent Labs Lab 05/09/17 1531  ALBUMIN 2.0*   No results for input(s): LIPASE, AMYLASE in the last 168 hours. No results for input(s): AMMONIA in the last 168 hours. Coagulation Profile:  Recent Labs Lab 05/09/17 1038  INR 1.22   Cardiac Enzymes: No results for input(s): CKTOTAL, CKMB, CKMBINDEX, TROPONINI in the last 168 hours. BNP (last 3 results) No results for input(s): PROBNP in the last 8760 hours. HbA1C: No results for input(s): HGBA1C in the last 72 hours. CBG:  Recent Labs Lab 05/09/17 1202 05/09/17 1828 05/09/17 2014 05/10/17 0827 05/10/17 1216  GLUCAP 165* 139* 144* 132* 175*   Lipid Profile: No results for input(s): CHOL, HDL, LDLCALC, TRIG,  CHOLHDL, LDLDIRECT in the last 72 hours. Thyroid Function Tests: No results for input(s): TSH, T4TOTAL, FREET4, T3FREE, THYROIDAB in the last 72 hours. Anemia Panel: No results for input(s): VITAMINB12, FOLATE, FERRITIN, TIBC, IRON, RETICCTPCT in the last 72 hours. Sepsis Labs: No results for input(s): PROCALCITON, LATICACIDVEN in the last 168 hours.  Recent Results (from the past 240 hour(s))  Urine Culture     Status: Abnormal   Collection Time: 05/06/17  4:43 PM  Result Value Ref Range Status   Specimen Description URINE, RANDOM  Final   Special Requests NONE  Final   Culture (A)  Final    50,000 COLONIES/mL STAPHYLOCOCCUS SPECIES (COAGULASE NEGATIVE)   Report Status 05/08/2017 FINAL  Final   Organism ID, Bacteria STAPHYLOCOCCUS SPECIES (COAGULASE NEGATIVE) (A)  Final      Susceptibility   Staphylococcus species (coagulase negative) - MIC*    CIPROFLOXACIN >=8 RESISTANT Resistant     GENTAMICIN <=0.5 SENSITIVE Sensitive     NITROFURANTOIN <=16 SENSITIVE Sensitive     OXACILLIN >=4 RESISTANT Resistant     TETRACYCLINE <=1 SENSITIVE Sensitive     VANCOMYCIN <=0.5 SENSITIVE Sensitive     TRIMETH/SULFA <=10 SENSITIVE Sensitive     CLINDAMYCIN >=8 RESISTANT Resistant     RIFAMPIN <=0.5 SENSITIVE Sensitive     Inducible Clindamycin NEGATIVE Sensitive     * 50,000 COLONIES/mL STAPHYLOCOCCUS SPECIES (COAGULASE NEGATIVE)  Culture, blood (routine x 2)     Status: None (Preliminary result)   Collection Time: 05/06/17  4:53 PM  Result Value Ref Range Status   Specimen Description BLOOD RIGHT FOREARM  Final   Special Requests   Final    BOTTLES DRAWN AEROBIC AND ANAEROBIC Blood Culture adequate volume   Culture NO GROWTH 3 DAYS  Final   Report Status PENDING  Incomplete  Culture, blood (routine x 2)     Status: None (Preliminary result)   Collection Time: 05/06/17  5:10 PM  Result Value Ref Range Status   Specimen Description BLOOD LEFT ANTECUBITAL  Final   Special Requests   Final      BOTTLES DRAWN AEROBIC AND ANAEROBIC Blood Culture adequate volume   Culture NO GROWTH 3 DAYS  Final   Report Status PENDING  Incomplete  Surgical pcr screen     Status: None  Collection Time: 05/07/17 10:31 AM  Result Value Ref Range Status   MRSA, PCR NEGATIVE NEGATIVE Final   Staphylococcus aureus NEGATIVE NEGATIVE Final    Comment:        The Xpert SA Assay (FDA approved for NASAL specimens in patients over 30 years of age), is one component of a comprehensive surveillance program.  Test performance has been validated by [facility] for patients greater than or equal to 74 year old. It is not intended to diagnose infection nor to guide or monitor treatment.   Culture, Urine     Status: None   Collection Time: 05/09/17  2:16 PM  Result Value Ref Range Status   Specimen Description KIDNEY LEFT  Final   Special Requests NONE  Final   Culture NO GROWTH  Final   Report Status 05/10/2017 FINAL  Final  Culture, Urine     Status: None   Collection Time: 05/09/17  2:16 PM  Result Value Ref Range Status   Specimen Description KIDNEY RIGHT  Final   Special Requests NONE  Final   Culture NO GROWTH  Final   Report Status 05/10/2017 FINAL  Final       Radiology Studies: Dg Abd 1 View  Result Date: 05/09/2017 CLINICAL DATA:  Evaluate stent placement. EXAM: ABDOMEN - 1 VIEW COMPARISON:  CT, 04/14/2017 FINDINGS: There are bilateral ureteral stents, which appear well-positioned, and are stable when compared to the retrograde ureteral pyelogram dated 05/07/2017. Two stones project in the lower pole the left kidney, stable from the prior CT. No other evidence of renal stones. No evidence of a ureteral stone. Normal bowel gas pattern. No acute skeletal abnormality. IMPRESSION: Stable bilateral ureteral stents when compared to the previous retrograde new ureteral pyelogram. 2 nonobstructing stones in the lower pole the left kidney, also stable. Electronically Signed   By: Lajean Manes  M.D.   On: 05/09/2017 12:53   Ir Nephrostomy Placement Left  Result Date: 05/09/2017 INDICATION: 83 year old male with acute renal failure and bilateral hydronephrosis. The patient has known left-sided nephrolithiasis, with retrograde pyelograms performed recently (05/07/2017) and bilateral ureteral stent placement. Despite stenting hydronephrosis has worsened with worsening renal failure. He has been referred for therapeutic bilateral percutaneous nephrostomy evaluation. EXAM: IR NEPHROSTOMY PLACEMENT LEFT IR NEPHROSTOMY PLACEMENT RIGHT COMPARISON:  None. MEDICATIONS: Ciprofloxacin 400 mg IV; The antibiotic was administered in an appropriate time frame prior to skin puncture. ANESTHESIA/SEDATION: Fentanyl 100 mcg IV; Versed 2.0 mg IV Moderate Sedation Time:  21 minutes The patient was continuously monitored during the procedure by the interventional radiology nurse under my direct supervision. CONTRAST:  15 cc - administered into the collecting system(s) FLUOROSCOPY TIME:  Fluoroscopy Time: 1 minutes 18 seconds (6 mGy). COMPLICATIONS: None PROCEDURE: Informed written consent was obtained from the patient after a thorough discussion of the procedural risks, benefits and alternatives. All questions were addressed. Maximal Sterile Barrier Technique was utilized including caps, mask, sterile gowns, sterile gloves, sterile drape, hand hygiene and skin antiseptic. A timeout was performed prior to the initiation of the procedure. Left: Patient positioned prone position on the fluoroscopy table. Ultrasound survey of the left flank was performed with images stored and sent to PACs. The patient was then prepped and draped in the usual sterile fashion. 1% lidocaine was used to anesthetize the skin and subcutaneous tissues for local anesthesia. A Chiba needle was then used to access a posterior inferior calyx with ultrasound guidance. With spontaneous urine returned through the needle, passage of an 018 micro  wire into the  collecting system was performed under fluoroscopy. A small incision was made with an 11 blade scalpel, and the needle was removed from the wire. An Accustick system was then advanced over the wire into the collecting system under fluoroscopy. The metal stiffener and inner dilator were removed, and then a sample of fluid was aspirated through the 4 French outer sheath. Bentson wire was passed into the collecting system and the sheath removed. Ten French dilation of the soft tissues was performed. Using modified Seldinger technique, a 10 French pigtail catheter drain was placed over the Bentson wire. Wire and inner stiffener removed, and the pigtail was formed in the collecting system. Small amount of contrast confirmed position of the catheter. Right: Ultrasound survey of the right flank was then performed with images stored and sent to PACs. The patient was then prepped and draped in the usual sterile fashion. 1% lidocaine was used to anesthetize the skin and subcutaneous tissues for local anesthesia. A Chiba needle was then used to access a posterior inferior calyx with ultrasound guidance. With spontaneous urine returned through the needle, passage of an 018 micro wire into the collecting system was performed under fluoroscopy. A small incision was made with an 11 blade scalpel, and the needle was removed from the wire. An Accustick system was then advanced over the wire into the collecting system under fluoroscopy. The metal stiffener and inner dilator were removed, and then a sample of fluid was aspirated through the 4 French outer sheath. Bentson wire was passed into the collecting system and the sheath removed. Ten French dilation of the soft tissues was performed. Using modified Seldinger technique, a 10 French pigtail catheter drain was placed over the Bentson wire. Wire and inner stiffener removed, and the pigtail was formed in the collecting system. Small amount of contrast confirmed position of the  catheter. Patient tolerated the procedure well and remained hemodynamically stable throughout. No complications were encountered and no significant blood loss encountered IMPRESSION: Status post bilateral percutaneous nephrostomy placement. Separate culture was sent for each collecting system. Signed, Dulcy Fanny. Earleen Newport, DO [facility] Radiology Electronically Signed   By: Corrie Mckusick D.O.   On: 05/09/2017 14:35   Ir Nephrostomy Placement Right  Result Date: 05/09/2017 INDICATION: 83 year old male with acute renal failure and bilateral hydronephrosis. The patient has known left-sided nephrolithiasis, with retrograde pyelograms performed recently (05/07/2017) and bilateral ureteral stent placement. Despite stenting hydronephrosis has worsened with worsening renal failure. He has been referred for therapeutic bilateral percutaneous nephrostomy evaluation. EXAM: IR NEPHROSTOMY PLACEMENT LEFT IR NEPHROSTOMY PLACEMENT RIGHT COMPARISON:  None. MEDICATIONS: Ciprofloxacin 400 mg IV; The antibiotic was administered in an appropriate time frame prior to skin puncture. ANESTHESIA/SEDATION: Fentanyl 100 mcg IV; Versed 2.0 mg IV Moderate Sedation Time:  21 minutes The patient was continuously monitored during the procedure by the interventional radiology nurse under my direct supervision. CONTRAST:  15 cc - administered into the collecting system(s) FLUOROSCOPY TIME:  Fluoroscopy Time: 1 minutes 18 seconds (6 mGy). COMPLICATIONS: None PROCEDURE: Informed written consent was obtained from the patient after a thorough discussion of the procedural risks, benefits and alternatives. All questions were addressed. Maximal Sterile Barrier Technique was utilized including caps, mask, sterile gowns, sterile gloves, sterile drape, hand hygiene and skin antiseptic. A timeout was performed prior to the initiation of the procedure. Left: Patient positioned prone position on the fluoroscopy  table. Ultrasound survey of the left flank was performed with images stored and sent  to PACs. The patient was then prepped and draped in the usual sterile fashion. 1% lidocaine was used to anesthetize the skin and subcutaneous tissues for local anesthesia. A Chiba needle was then used to access a posterior inferior calyx with ultrasound guidance. With spontaneous urine returned through the needle, passage of an 018 micro wire into the collecting system was performed under fluoroscopy. A small incision was made with an 11 blade scalpel, and the needle was removed from the wire. An Accustick system was then advanced over the wire into the collecting system under fluoroscopy. The metal stiffener and inner dilator were removed, and then a sample of fluid was aspirated through the 4 French outer sheath. Bentson wire was passed into the collecting system and the sheath removed. Ten French dilation of the soft tissues was performed. Using modified Seldinger technique, a 10 French pigtail catheter drain was placed over the Bentson wire. Wire and inner stiffener removed, and the pigtail was formed in the collecting system. Small amount of contrast confirmed position of the catheter. Right: Ultrasound survey of the right flank was then performed with images stored and sent to PACs. The patient was then prepped and draped in the usual sterile fashion. 1% lidocaine was used to anesthetize the skin and subcutaneous tissues for local anesthesia. A Chiba needle was then used to access a posterior inferior calyx with ultrasound guidance. With spontaneous urine returned through the needle, passage of an 018 micro wire into the collecting system was performed under fluoroscopy. A small incision was made with an 11 blade scalpel, and the needle was removed from the wire. An Accustick system was then advanced over the wire into the collecting system under fluoroscopy. The metal stiffener and inner dilator were removed, and then a  sample of fluid was aspirated through the 4 French outer sheath. Bentson wire was passed into the collecting system and the sheath removed. Ten French dilation of the soft tissues was performed. Using modified Seldinger technique, a 10 French pigtail catheter drain was placed over the Bentson wire. Wire and inner stiffener removed, and the pigtail was formed in the collecting system. Small amount of contrast confirmed position of the catheter. Patient tolerated the procedure well and remained hemodynamically stable throughout. No complications were encountered and no significant blood loss encountered IMPRESSION: Status post bilateral percutaneous nephrostomy placement. Separate culture was sent for each collecting system. Signed, Dulcy Fanny. Earleen Newport, DO [facility] Radiology Electronically Signed   By: Corrie Mckusick D.O.   On: 05/09/2017 14:35      Scheduled Meds: . doxycycline  100 mg Oral Q12H  . feeding supplement (ENSURE ENLIVE)  237 mL Oral BID BM  . insulin aspart  0-9 Units Subcutaneous TID WC  . LORazepam  1 mg Oral QHS  . mouth rinse  15 mL Mouth Rinse BID  . multivitamin  1 tablet Oral QHS  . pravastatin  40 mg Oral Q supper  . sodium bicarbonate  1,300 mg Oral TID  . sodium chloride flush  3 mL Intravenous Q12H   Continuous Infusions: . heparin 1,250 Units/hr (05/10/17 1128)     LOS: 4 days    Time spent: 30 minutes   Dessa Phi, DO [facility] [email or web address] Password [email or web address] 05/10/2017, 12:30 PM

## 2017-05-10 NOTE — Progress Notes (Signed)
Nutrition Follow Up Note  DOCUMENTATION CODES:   Severe malnutrition in context of chronic illness  INTERVENTION:   Ensure Enlive po BID, each supplement provides 350 kcal and 20 grams of protein  Recommend renal appropriate MVI  NUTRITION DIAGNOSIS:   Malnutrition (severe) related to chronic illness, poor appetite (renal disease and DM) as evidenced by 16 percent weight loss in 6 months, severe depletion of body fat, severe depletion of muscle mass, energy intake < 75% for > or equal to 1 month.  GOAL:   Patient will meet greater than or equal to 90% of their needs  MONITOR:   PO intake, Supplement acceptance, Labs, Weight trends, I & O's  REASON FOR ASSESSMENT:   Consult Diet education  ASSESSMENT:   83 y.o. male with a history of AFib on eliquis, CVA in Nov 2017, HTN, IDDM, prostate CA, and OA s/p bilateral TKA who was brought to the ED by his family on 6/9 due to progressive generalized weakness, worsening in the past few weeks, leading to recurrent falls. Workup revealed acute kidney injury, leukocytosis with evidence of UTI, bilateral hydronephrosis on renal ultrasound. Urology was consulted and patient underwent ureteral stent placement on 6/10.    Met with pt and family in room today. Per pt's family, pt has had a slow decline in his appetite over the past 1-1 1/2 years but has really had a decline in appetite over the past 3 months. Pt is only eating a few bites of food and in the past, he has been a good eater and very passionate about food. Pt seems excited about food when talking to RD today; he is sharing recipes and talking about his trips to the farmer's markets. Per family, pt and his wife eat away from home a lot and use a lot of foods from a can when cooking at home. Pt reports a change in his taste and reports that a lot of foods have lost their flavor. Pt is currently eating 25% of meals in hospital and drinking a few sips of Glucerna. Family reports that pt has  lost 40lbs(16%) over the past six months; this is severe. RD would prefer patient to drink Ensure to provide additional protein. Pt and family were educated today regarding a renal and carbohydrate controlled diet.      Medications reviewed and include: doxycycline, insulin, Na Bicarbonate, heparin  Labs reviewed: K 4.6 wnl, BUN 76(H), creat 3.23(H), Ca 8.7(L) adj 10.3 wnl, alb 2.0(L), P 5.6(H) cbgs- 169, 188, 133 x 24 hrs AIC 6.8(H)-09/2016  Nutrition-Focused physical exam completed. Findings are severe fat depletion in chest and upper arms, moderate to severe muscle depletions over entire body and mild generalized edema.   Diet Order:  Diet heart healthy/carb modified Room service appropriate? Yes; Fluid consistency: Thin  Skin:  Wound (see comment)  Last BM:  6/12  Height:   Ht Readings from Last 1 Encounters:  05/06/17 _0  (1.854 m)    Weight:   Wt Readings from Last 1 Encounters:  05/09/17 213 lb 3 oz (96.7 kg)    Ideal Body Weight:  83.6 kg  BMI:  Body mass index is 28.13 kg/m.  Estimated Nutritional Needs:   Kcal:  5366-4403 kcals  Protein:  100-115 g  Fluid:  >/= 2 L  EDUCATION NEEDS:   Education needs addressed  Koleen Distance MS, RD, LDN Pager #- [phone]

## 2017-05-10 NOTE — PMR Pre-admission (Signed)
PMR Admission Coordinator Pre-Admission Assessment  Patient: William [name] is an 83 y.o., male MRN: [id number] DOB: [birth year] Height: '6\' 2"'$  (188 cm) Weight: 94.3 kg (208 lb)             Insurance Information HMO: No    PPO:       PCP:       IPA:       80/20:       OTHER:   PRIMARY:  Medicare A/B      Policy#: [id number]      Subscriber: [place] CM Name:        Phone#:       Fax#:   Pre-Cert#:        Employer: Retired Benefits:  Phone #:       Name: Checked in [place]. Date: 05/28/01     Deduct:  $1340      Out of Pocket Max:  None      Life Max: N/A CIR: 100%      SNF: 100 days Outpatient: 80%     Co-Pay: 20% Home Health: 100%      Co-Pay: none DME: 80%     Co-Pay: 20% Providers: patient's choice  SECONDARY:  Generic commercial      Policy#: [id number]      Subscriber:  [employer] [place] CM Name:        Phone#:       Fax#:   Pre-Cert#:        Employer:  Retired Benefits:  Phone #:  [phone]     Name:   Eff. Date:       Deduct:        Out of Pocket Max:        Life Max:   CIR:        SNF:   Outpatient:       Co-Pay:   Home Health:        Co-Pay:   DME:       Co-Pay:    Emergency Contact Information Contact Information    Name Relation Home Work Norman Spouse [phone]  [phone]   Phelan,Veronica Daughter [phone]       Current Medical History  Patient Admitting Diagnosis:  Ureteral strictures s/p stents  History of Present Illness: An 83 y.o.right handed malewith history of Diabetes mellitus, hypertension, prostate cancer with radiation therapy, CVA November 2017 and did not receive inpatient rehabilitation services discharged to home at supervision level, A. fib maintained on Eliquis, right TKA 2015. Per chart review and familypatient lives with spouse, who can only provide supervision at discharge. Independent driving prior to admission. One level home.Wife limited physical assistance.Presented 05/06/2017 with generalized  weakness/lethargy, weight loss and recurrent falls over the past 2 weeks as well as reported urinary frequency and nocturia. Creatinine elevated 4.95 from baseline 1.01 BUN 43. Recent CT revealed left hydronephrosis with dilation of the ureter down to the pelvic brim ofthe iliac and then the ureter appeared to taper to a normal caliber. Ultrasound follow-up revealed moderate left hydronephrosis. Urology consulted and underwent cystoscopy with bilateral retrograde myelogram with findings of ureteral strictures and underwent stent placement 05/09/2017 per Dr Junious Silk as well as bilateral nephrostomy tubes per interventional radiology 05/10/2017. Nephrology consulted for AKIfelt to be mostly obstructive and continue to monitor his creatinine 1.57. Chronic Eliquisresumed 05/11/2017.Coag negative staph UTI and completing course of doxycycline and monitoring white blood cell count 16,000 as patient remains afebrile. Sodium trending up to  147 on 05/12/17 and plans to push fluids/hydrate over the next day.   Patient with intermittent bouts of restlessness agitation requiring Ativan. Therapy evaluations ongoing. M.D. has requested physical medicine rehabilitation consult. Patient to be admitted for a comprehensive inpatient rehabilitation program.   Past Medical History  Past Medical History:  Diagnosis Date  . Arthritis   . Diabetes mellitus without complication ([facility])   . Hypercholesteremia   . Hypertension   . Prostate cancer [facility])    with radiation treatment  . Sleep apnea 2/14   "mild per patient" hasnt gotten CPAP machine  . Stroke [facility])     Family History  family history includes Colon cancer in his father; Stroke in his maternal grandmother.  Prior Rehab/Hospitalizations: Had outpatient therapy at neuro rehab after CVA 11/17.  Had [facility] with [facility] after B TKR 3 years ago.  Has the patient had major surgery during 100 days prior to admission? No  Current Medications   Current  Facility-Administered Medications:  .  acetaminophen (TYLENOL) tablet 650 mg, 650 mg, Oral, Q6H PRN, Dessa Phi Chahn-Yang, DO, 650 mg at 05/11/17 1821 .  amLODipine (NORVASC) tablet 5 mg, 5 mg, Oral, QHS, Deterding, James, MD, 5 mg at 05/11/17 2210 .  apixaban (ELIQUIS) tablet 5 mg, 5 mg, Oral, BID, Dessa Phi Chahn-Yang, DO, 5 mg at 05/12/17 1056 .  doxycycline (VIBRA-TABS) tablet 100 mg, 100 mg, Oral, Q12H, Dessa Phi Chahn-Yang, DO, 100 mg at 05/12/17 1056 .  feeding supplement (ENSURE ENLIVE) (ENSURE ENLIVE) liquid 237 mL, 237 mL, Oral, BID BM, Dessa Phi Chahn-Yang, DO, 237 mL at 05/12/17 1055 .  insulin aspart (novoLOG) injection 0-9 Units, 0-9 Units, Subcutaneous, TID WC, Patrecia Pour, MD, 2 Units at 05/12/17 0820 .  LORazepam (ATIVAN) tablet 1 mg, 1 mg, Oral, PRN, Dessa Phi Chahn-Yang, DO .  MEDLINE mouth rinse, 15 mL, Mouth Rinse, BID, Vance Gather B, MD, 15 mL at 05/12/17 1056 .  multivitamin (RENA-VIT) tablet 1 tablet, 1 tablet, Oral, QHS, Dessa Phi Chahn-Yang, DO, 1 tablet at 05/11/17 2210 .  pravastatin (PRAVACHOL) tablet 40 mg, 40 mg, Oral, Q supper, Patrecia Pour, MD, 40 mg at 05/11/17 1822 .  sodium bicarbonate tablet 1,300 mg, 1,300 mg, Oral, TID, Deterding, James, MD, 1,300 mg at 05/12/17 1056 .  sodium chloride flush (NS) 0.9 % injection 3 mL, 3 mL, Intravenous, Q12H, Patrecia Pour, MD, 3 mL at 05/09/17 2101 .  traZODone (DESYREL) tablet 50 mg, 50 mg, Oral, QHS, Dessa Phi Chahn-Yang, DO, 50 mg at 05/11/17 2210  Patients Current Diet: Diet heart healthy/carb modified Room service appropriate? Yes; Fluid consistency: Thin  Precautions / Restrictions Precautions Precautions: Fall Restrictions Weight Bearing Restrictions: No   Has the patient had 2 or more falls or a fall with injury in the past year?Yes.  Reports 2-3 falls recently with cut to his head and a bruise on his hip.  Prior Activity Level Community (5-7x/wk): Went out daily.  Was  active, driving.  Home Assistive Devices / Equipment Home Assistive Devices/Equipment: CBG Meter, Dentures (specify type), Eyeglasses, Hand-held shower hose, Hearing aid, Tub transfer bench, Walker (specify type) Home Equipment: Grab bars - tub/shower, Cane - single point, Shower seat - built in  Prior Device Use: Indicate devices/aids used by the patient prior to current illness, exacerbation or injury? Seated Walker for the past few days only.  Prior Functional Level Prior Function Level of Independence: Independent Comments: drives, takes Risk analyst classes, has been diong Tai Chi for a  few years, loves to travel  Self Care: Did the patient need help bathing, dressing, using the toilet or eating?  Independent  Indoor Mobility: Did the patient need assistance with walking from room to room (with or without device)? Independent  Stairs: Did the patient need assistance with internal or external stairs (with or without device)? Independent  Functional Cognition: Did the patient need help planning regular tasks such as shopping or remembering to take medications? Independent  Current Functional Level Cognition  Overall Cognitive Status: Within Functional Limits for tasks assessed Orientation Level: Oriented to person, Oriented to situation, Disoriented to time, Disoriented to place    Extremity Assessment (includes Sensation/Coordination)  Upper Extremity Assessment: Generalized weakness  Lower Extremity Assessment: Generalized weakness    ADLs       Mobility  Overal bed mobility: Needs Assistance Bed Mobility: Supine to Sit Supine to sit: Min assist General bed mobility comments: Min A to sit upright at EOB    Transfers  Overall transfer level: Needs assistance Equipment used: Rolling walker (2 wheeled) Transfers: Sit to/from Stand Sit to Stand: Min assist Stand pivot transfers: Mod assist General transfer comment: Min A to power up from edge of bed, cues for hand  placement. Pt still reaches for RW to stand    Ambulation / Gait / Stairs / Wheelchair Mobility  Ambulation/Gait Ambulation/Gait assistance: Museum/gallery curator (Feet): 50 Feet Assistive device: Rolling walker (2 wheeled) Gait Pattern/deviations: Step-through pattern General Gait Details: cues for proximity to RW, and upright posture as he fatigues. Gait velocity: decreased Gait velocity interpretation: Below normal speed for age/gender    Posture / Balance Balance Overall balance assessment: Needs assistance Sitting-balance support: No upper extremity supported, Feet supported Sitting balance-Leahy Scale: Fair Standing balance support: Bilateral upper extremity supported Standing balance-Leahy Scale: Poor Standing balance comment: reliant on RW for stability in standing    Special needs/care consideration BiPAP/CPAP Yes, CPAP is new but not using it yet. CPM No Continuous Drip IV Heparin drip Dialysis No        Life Vest No Oxygen No Special Bed No Trach Size No Wound Vac (area) No     Skin Has bruising on arms and on his hip                           Bowel mgmt: Last BM 05/10/17 Bladder mgmt: Condom catheter.  Bilateral nephrostomy tubes. Diabetic mgmt Yes, on oral medications and insulin at home.    Previous Home Environment Living Arrangements: Spouse/significant other Available Help at Discharge: Family, Available 24 hours/day Type of Home: House Home Layout: One level Home Access: Stairs to enter Entrance Stairs-Rails: None Entrance Stairs-Number of Steps: 1 Bathroom Shower/Tub: Public librarian, Gaffer, Architectural technologist: Handicapped height Home Care Services: No  Discharge Living Setting Plans for Discharge Living Setting: Patient's home, House, Lives with (comment) (Lives with wife.) Type of Home at Discharge: House Discharge Home Layout: One level Discharge Home Access: Stairs to enter Entrance Stairs-Number of Steps: 1 step  entry Does the patient have any problems obtaining your medications?: No  Social/Family/Support Systems Patient Roles: Spouse, Parent (Has a wife and 6 grown children.) Contact Information: Lawayne Hartig - spouse Anticipated Caregiver: wife Anticipated Caregiver's Contact Information: Mardene Celeste - wife - (h) [phone] (c) [phone] Ability/Limitations of Caregiver: Wife cannot provide physical assistance. Caregiver Availability: 24/7 Discharge Plan Discussed with Primary Caregiver: Yes Is Caregiver In Agreement with Plan?: Yes Does Caregiver/Family have Issues  with Lodging/Transportation while Pt is in Rehab?: No  Goals/Additional Needs Patient/Family Goal for Rehab: PT/OT mod I goals Expected length of stay: 7-10 days Cultural Considerations: Catholic Dietary Needs: Heart healthy, carb mod, thin liquids Equipment Needs: TBD Pt/Family Agrees to Admission and willing to participate: Yes Program Orientation Provided & Reviewed with Pt/Caregiver Including Roles  & Responsibilities: Yes  Decrease burden of Care through IP rehab admission: N/A  Possible need for SNF placement upon discharge: Not planned  Patient Condition: This patient's medical and functional status has changed since the consult dated: 05/09/17 in which the Rehabilitation Physician determined and documented that the patient's condition is appropriate for intensive rehabilitative care in an inpatient rehabilitation facility. See "History of Present Illness" (above) for medical update. Functional changes are:  Currently requiring min assist to ambulate 50 feet RW. Patient's medical and functional status update has been discussed with the Rehabilitation physician and patient remains appropriate for inpatient rehabilitation. Will admit to inpatient rehab tomorrow.  Preadmission Screen Completed By:  Retta Diones, 05/12/2017 12:33 PM ______________________________________________________________________   Discussed  status with Dr. Naaman Plummer on 05/12/17 at 1233 and received telephone approval for admission tomorrow.  Admission Coordinator:  Retta Diones, time 1233/Date 05/12/17

## 2017-05-10 NOTE — Care Management Important Message (Signed)
Important Message  Patient Details  Name: William [name] MRN: [id number] Date of Birth: [birth year]   Medicare Important Message Given:  Yes    Shaquina Gillham [address] 05/10/2017, 11:20 AM

## 2017-05-10 NOTE — Progress Notes (Signed)
Subjective: Interval History: has no complaint . Has bilat PCNx,  Now draining through those..  Objective: Vital signs in last 24 hours: Temp:  [97.7 F (36.5 C)-98 F (36.7 C)] 97.7 F (36.5 C) (06/13 0551) Pulse Rate:  [66-79] 79 (06/13 0551) Resp:  [10-19] 16 (06/13 0551) BP: (138-181)/(68-103) 181/91 (06/13 0551) SpO2:  [95 %-100 %] 97 % (06/13 0551) Weight:  [94.1 kg (207 lb 6.4 oz)-96.7 kg (213 lb 3 oz)] 96.7 kg (213 lb 3 oz) (06/12 2100) Weight change:   Intake/Output from previous day: 06/12 0701 - 06/13 0700 In: 367.6 [P.O.:180; I.V.:177.6] Out: 3420 [Urine:100; Drains:3320] Intake/Output this shift: Total I/O In: 360 [P.O.:360] Out: 350 [Drains:350]  General appearance: cooperative, no distress, pale and slowed mentation Resp: diminished breath sounds bilaterally Cardio: irregularly irregular rhythm and systolic murmur: systolic ejection 2/6, decrescendo at 2nd left intercostal space GI: soft,pos bs, liver down 5 cm, bilat flank tubes Extremities: extremities normal, atraumatic, no cyanosis or edema  Lab Results:  Recent Labs  05/09/17 0608 05/10/17 0510  WBC 13.4* 10.3  HGB 11.3* 12.6*  HCT 33.7* 38.1*  PLT 195 199   BMET:  Recent Labs  05/09/17 1531 05/10/17 0510  NA 141 142  K 5.2* 4.6  CL 113* 111  CO2 14* 18*  GLUCOSE 188* 133*  BUN 99* 76*  CREATININE 4.95* 3.23*  CALCIUM 8.7* 8.7*   No results for input(s): PTH in the last 72 hours. Iron Studies: No results for input(s): IRON, TIBC, TRANSFERRIN, FERRITIN in the last 72 hours.  Studies/Results: Dg Abd 1 View  Result Date: 05/09/2017 CLINICAL DATA:  Evaluate stent placement. EXAM: ABDOMEN - 1 VIEW COMPARISON:  CT, 04/14/2017 FINDINGS: There are bilateral ureteral stents, which appear well-positioned, and are stable when compared to the retrograde ureteral pyelogram dated 05/07/2017. Two stones project in the lower pole the left kidney, stable from the prior CT. No other evidence of renal  stones. No evidence of a ureteral stone. Normal bowel gas pattern. No acute skeletal abnormality. IMPRESSION: Stable bilateral ureteral stents when compared to the previous retrograde new ureteral pyelogram. 2 nonobstructing stones in the lower pole the left kidney, also stable. Electronically Signed   By: Lajean Manes M.D.   On: 05/09/2017 12:53   US Renal  Result Date: 05/08/2017 CLINICAL DATA:  Hydronephrosis.  History of prostate cancer. EXAM: RENAL / URINARY TRACT ULTRASOUND COMPLETE COMPARISON:  Ultrasound of May 06, 2017. FINDINGS: Right Kidney: Length: 12.9 cm. Echogenicity within normal limits. Right ureteral stent is not well visualized. No mass visualized. Moderate hydronephrosis is noted. Left Kidney: Length: 14.3 cm. Echogenicity within normal limits. No mass visualized. Moderate hydronephrosis is noted. Left ureteral stent is noted. Bladder: Foley catheter is noted in urinary bladder. Bladder appears to be otherwise normal for degree of distension. IMPRESSION: Moderate bilateral hydronephrosis is noted. Left ureteral stent is noted. Right ureteral stent is not well visualized. Electronically Signed   By: Marijo Conception, M.D.   On: 05/08/2017 12:29   Ir Nephrostomy Placement Left  Result Date: 05/09/2017 INDICATION: 83 year old male with acute renal failure and bilateral hydronephrosis. The patient has known left-sided nephrolithiasis, with retrograde pyelograms performed recently (05/07/2017) and bilateral ureteral stent placement. Despite stenting hydronephrosis has worsened with worsening renal failure. He has been referred for therapeutic bilateral percutaneous nephrostomy evaluation. EXAM: IR NEPHROSTOMY PLACEMENT LEFT IR NEPHROSTOMY PLACEMENT RIGHT COMPARISON:  None. MEDICATIONS: Ciprofloxacin 400 mg IV; The antibiotic was administered in an appropriate time frame prior to skin puncture. ANESTHESIA/SEDATION:  Fentanyl 100 mcg IV; Versed 2.0 mg IV Moderate Sedation Time:  21 minutes The  patient was continuously monitored during the procedure by the interventional radiology nurse under my direct supervision. CONTRAST:  15 cc - administered into the collecting system(s) FLUOROSCOPY TIME:  Fluoroscopy Time: 1 minutes 18 seconds (6 mGy). COMPLICATIONS: None PROCEDURE: Informed written consent was obtained from the patient after a thorough discussion of the procedural risks, benefits and alternatives. All questions were addressed. Maximal Sterile Barrier Technique was utilized including caps, mask, sterile gowns, sterile gloves, sterile drape, hand hygiene and skin antiseptic. A timeout was performed prior to the initiation of the procedure. Left: Patient positioned prone position on the fluoroscopy table. Ultrasound survey of the left flank was performed with images stored and sent to PACs. The patient was then prepped and draped in the usual sterile fashion. 1% lidocaine was used to anesthetize the skin and subcutaneous tissues for local anesthesia. A Chiba needle was then used to access a posterior inferior calyx with ultrasound guidance. With spontaneous urine returned through the needle, passage of an 018 micro wire into the collecting system was performed under fluoroscopy. A small incision was made with an 11 blade scalpel, and the needle was removed from the wire. An Accustick system was then advanced over the wire into the collecting system under fluoroscopy. The metal stiffener and inner dilator were removed, and then a sample of fluid was aspirated through the 4 French outer sheath. Bentson wire was passed into the collecting system and the sheath removed. Ten French dilation of the soft tissues was performed. Using modified Seldinger technique, a 10 French pigtail catheter drain was placed over the Bentson wire. Wire and inner stiffener removed, and the pigtail was formed in the collecting system. Small amount of contrast confirmed position of the catheter. Right: Ultrasound survey of the  right flank was then performed with images stored and sent to PACs. The patient was then prepped and draped in the usual sterile fashion. 1% lidocaine was used to anesthetize the skin and subcutaneous tissues for local anesthesia. A Chiba needle was then used to access a posterior inferior calyx with ultrasound guidance. With spontaneous urine returned through the needle, passage of an 018 micro wire into the collecting system was performed under fluoroscopy. A small incision was made with an 11 blade scalpel, and the needle was removed from the wire. An Accustick system was then advanced over the wire into the collecting system under fluoroscopy. The metal stiffener and inner dilator were removed, and then a sample of fluid was aspirated through the 4 French outer sheath. Bentson wire was passed into the collecting system and the sheath removed. Ten French dilation of the soft tissues was performed. Using modified Seldinger technique, a 10 French pigtail catheter drain was placed over the Bentson wire. Wire and inner stiffener removed, and the pigtail was formed in the collecting system. Small amount of contrast confirmed position of the catheter. Patient tolerated the procedure well and remained hemodynamically stable throughout. No complications were encountered and no significant blood loss encountered IMPRESSION: Status post bilateral percutaneous nephrostomy placement. Separate culture was sent for each collecting system. Signed, Dulcy Fanny. Earleen Newport, DO [facility] Radiology Electronically Signed   By: Corrie Mckusick D.O.   On: 05/09/2017 14:35   Ir Nephrostomy Placement Right  Result Date: 05/09/2017 INDICATION: 83 year old male with acute renal failure and bilateral hydronephrosis. The patient has known left-sided nephrolithiasis, with retrograde pyelograms performed recently (05/07/2017) and  bilateral ureteral stent placement. Despite stenting hydronephrosis  has worsened with worsening renal failure. He has been referred for therapeutic bilateral percutaneous nephrostomy evaluation. EXAM: IR NEPHROSTOMY PLACEMENT LEFT IR NEPHROSTOMY PLACEMENT RIGHT COMPARISON:  None. MEDICATIONS: Ciprofloxacin 400 mg IV; The antibiotic was administered in an appropriate time frame prior to skin puncture. ANESTHESIA/SEDATION: Fentanyl 100 mcg IV; Versed 2.0 mg IV Moderate Sedation Time:  21 minutes The patient was continuously monitored during the procedure by the interventional radiology nurse under my direct supervision. CONTRAST:  15 cc - administered into the collecting system(s) FLUOROSCOPY TIME:  Fluoroscopy Time: 1 minutes 18 seconds (6 mGy). COMPLICATIONS: None PROCEDURE: Informed written consent was obtained from the patient after a thorough discussion of the procedural risks, benefits and alternatives. All questions were addressed. Maximal Sterile Barrier Technique was utilized including caps, mask, sterile gowns, sterile gloves, sterile drape, hand hygiene and skin antiseptic. A timeout was performed prior to the initiation of the procedure. Left: Patient positioned prone position on the fluoroscopy table. Ultrasound survey of the left flank was performed with images stored and sent to PACs. The patient was then prepped and draped in the usual sterile fashion. 1% lidocaine was used to anesthetize the skin and subcutaneous tissues for local anesthesia. A Chiba needle was then used to access a posterior inferior calyx with ultrasound guidance. With spontaneous urine returned through the needle, passage of an 018 micro wire into the collecting system was performed under fluoroscopy. A small incision was made with an 11 blade scalpel, and the needle was removed from the wire. An Accustick system was then advanced over the wire into the collecting system under fluoroscopy. The metal stiffener and inner dilator were removed, and then a sample of fluid was aspirated through the 4  French outer sheath. Bentson wire was passed into the collecting system and the sheath removed. Ten French dilation of the soft tissues was performed. Using modified Seldinger technique, a 10 French pigtail catheter drain was placed over the Bentson wire. Wire and inner stiffener removed, and the pigtail was formed in the collecting system. Small amount of contrast confirmed position of the catheter. Right: Ultrasound survey of the right flank was then performed with images stored and sent to PACs. The patient was then prepped and draped in the usual sterile fashion. 1% lidocaine was used to anesthetize the skin and subcutaneous tissues for local anesthesia. A Chiba needle was then used to access a posterior inferior calyx with ultrasound guidance. With spontaneous urine returned through the needle, passage of an 018 micro wire into the collecting system was performed under fluoroscopy. A small incision was made with an 11 blade scalpel, and the needle was removed from the wire. An Accustick system was then advanced over the wire into the collecting system under fluoroscopy. The metal stiffener and inner dilator were removed, and then a sample of fluid was aspirated through the 4 French outer sheath. Bentson wire was passed into the collecting system and the sheath removed. Ten French dilation of the soft tissues was performed. Using modified Seldinger technique, a 10 French pigtail catheter drain was placed over the Bentson wire. Wire and inner stiffener removed, and the pigtail was formed in the collecting system. Small amount of contrast confirmed position of the catheter. Patient tolerated the procedure well and remained hemodynamically stable throughout. No complications were encountered and no significant blood loss encountered IMPRESSION: Status post bilateral percutaneous nephrostomy placement. Separate culture was sent for each collecting system. Signed, Dulcy Fanny. Earleen Newport, DO  [facility] Radiology Electronically Signed   By: Corrie Mckusick D.O.   On: 05/09/2017 14:35    I have reviewed the patient's current medications.  Assessment/Plan: 1 AKI improving.  Nonoliguric with relief of obstruction.  Improving solute, acid/base.   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

## 2017-05-10 NOTE — Progress Notes (Signed)
PT Cancellation Note (late entry for 6/12)  Patient Details Name: William [name] MRN: [id number] DOB: [birth year]   Cancelled Treatment:       Attempted to see [name] on 6/12, however he was just back from nephrostomy tube placement;   Continuing to follow;   Roney Marion, [place] Pager [phone] Office [place] 05/10/2017, 3:55 PM

## 2017-05-10 NOTE — Progress Notes (Signed)
Referring Physician(s): Festus Aloe  Supervising Physician: Marybelle Killings  Patient Status:  [name] - In-pt  Chief Complaint:  Bilateral hydronephrosis S/P bilateral PCNs by Dr. Earleen Newport 05/09/2017  Subjective:  Patient resting comfortably in bed. Family at bedside. They report he is doing well today.  Allergies: Glyburide  Medications: Prior to Admission medications   Medication Sig Start Date End Date Taking? Authorizing Provider  acetaminophen (TYLENOL) 325 MG tablet Take 325-650 mg by mouth every 6 (six) hours as needed for mild pain.   Yes [provider]  amoxicillin (AMOXIL) 500 MG capsule Take 2,000 mg by mouth See admin instructions. ONE HOUR PRIOR TO DENTAL PROCEDURES 12/21/15  Yes [provider]  apixaban (ELIQUIS) 5 MG TABS tablet Take 1 tablet (5 mg total) by mouth 2 (two) times daily. 10/28/16  Yes Ghimire, Henreitta Leber, MD  Coenzyme Q10 (CO Q-10) 300 MG CAPS Take 300 mg by mouth daily with breakfast.   Yes [provider]  Glucosamine-Chondroit-Vit C-Mn (GLUCOSAMINE-CHONDROITIN) TABS Take 1 tablet by mouth 2 (two) times daily.   Yes [provider]  HUMALOG KWIKPEN 100 UNIT/ML KiwkPen Inject 6 Units into the skin 3 (three) times daily before meals.  12/11/15  Yes [provider]  loperamide (IMODIUM A-D) 2 MG tablet Take 2 mg by mouth 4 (four) times daily as needed for diarrhea or loose stools.   Yes [provider]  ONGLYZA 5 MG TABS tablet Take 5 mg by mouth every morning.  05/11/15  Yes [provider]  pravastatin (PRAVACHOL) 40 MG tablet Take 40 mg by mouth daily with supper.    Yes [provider]  LEVEMIR FLEXTOUCH 100 UNIT/ML Pen Inject 16 Units into the skin at bedtime.  08/08/16   [provider]  lisinopril-hydrochlorothiazide (PRINZIDE,ZESTORETIC) 10-12.5 MG per tablet Take 1 tablet by mouth every morning.     [provider]  metFORMIN (GLUCOPHAGE) 1000 MG tablet Take  1,000 mg by mouth 2 (two) times daily with a meal.    [provider]     Vital Signs: BP (!) 161/77 (BP Location: Left Arm)   Pulse 74   Temp 97.6 F (36.4 C) (Oral)   Resp 16   Ht 6\' 1"  (1.854 m)   Wt 213 lb 3 oz (96.7 kg)   SpO2 98%   BMI 28.13 kg/m   Physical Exam Sleeping, NAD Urine in PCN bags in clearing up nicely.  Tiny amount of drainage on the PCN dressing/gauze (will have nurses change gauze prn) Creatinine improving  Imaging: Dg Abd 1 View  Result Date: 05/09/2017 CLINICAL DATA:  Evaluate stent placement. EXAM: ABDOMEN - 1 VIEW COMPARISON:  CT, 04/14/2017 FINDINGS: There are bilateral ureteral stents, which appear well-positioned, and are stable when compared to the retrograde ureteral pyelogram dated 05/07/2017. Two stones project in the lower pole the left kidney, stable from the prior CT. No other evidence of renal stones. No evidence of a ureteral stone. Normal bowel gas pattern. No acute skeletal abnormality. IMPRESSION: Stable bilateral ureteral stents when compared to the previous retrograde new ureteral pyelogram. 2 nonobstructing stones in the lower pole the left kidney, also stable. Electronically Signed   By: Lajean Manes M.D.   On: 05/09/2017 12:53   Dg Retrograde Pyelogram  Result Date: 05/07/2017 CLINICAL DATA:  Nephrolithiasis EXAM: RETROGRADE PYELOGRAM; DG C-ARM 61-120 MIN COMPARISON:  CT abdomen and pelvis Apr 14, 2017 FLUOROSCOPY TIME:  2 minutes 38 seconds; 7 acquired images FINDINGS: Retrograde filling of  both ureters and collecting systems was performed intraoperatively. On the right, there is dilatation of the distal right ureter with an apparent stricture in the ureter at the mid sacral level. There is a rather abrupt band in the proximal right ureter just distal to the right ureteropelvic junction. No calculus is seen on this study on the right. On the left, there is a filling defect in the proximal left ureter consistent with a calculus.  There is also a calculus in a lower pole calyx on the left. There is fullness of the left renal pelvis. Double-J stents were subsequently placed bilaterally. IMPRESSION: Calculi in the proximal left ureter in lower pole left renal calyx with double-J stent subsequently placed on the left. On the right, apparent stricture in the right ureter at the mid sacral level. Abrupt turn with possible kink in the proximal right ureter with double-J stent placed on the right. Electronically Signed   By: Lowella Grip III M.D.   On: 05/07/2017 15:14   US Renal  Result Date: 05/08/2017 CLINICAL DATA:  Hydronephrosis.  History of prostate cancer. EXAM: RENAL / URINARY TRACT ULTRASOUND COMPLETE COMPARISON:  Ultrasound of May 06, 2017. FINDINGS: Right Kidney: Length: 12.9 cm. Echogenicity within normal limits. Right ureteral stent is not well visualized. No mass visualized. Moderate hydronephrosis is noted. Left Kidney: Length: 14.3 cm. Echogenicity within normal limits. No mass visualized. Moderate hydronephrosis is noted. Left ureteral stent is noted. Bladder: Foley catheter is noted in urinary bladder. Bladder appears to be otherwise normal for degree of distension. IMPRESSION: Moderate bilateral hydronephrosis is noted. Left ureteral stent is noted. Right ureteral stent is not well visualized. Electronically Signed   By: Marijo Conception, M.D.   On: 05/08/2017 12:29   US Renal  Result Date: 05/06/2017 CLINICAL DATA:  Acute renal failure EXAM: RENAL / URINARY TRACT ULTRASOUND COMPLETE COMPARISON:  CT abdomen/ pelvis dated 04/14/2017 FINDINGS: Right Kidney: Length: 13.8 cm.  Moderate right hydronephrosis, new. Left Kidney: Length: 14.8 cm.  Moderate left hydronephrosis, unchanged Bladder: Not discretely visualized. Additional comments:  Right pleural effusion.  Ascites. IMPRESSION: Moderate left hydronephrosis, unchanged from prior CT. Moderate right hydronephrosis, new. Bladder is not discretely visualized.  Electronically Signed   By: Julian Hy M.D.   On: 05/06/2017 18:56   Dg C-arm 1-60 Min  Result Date: 05/07/2017 CLINICAL DATA:  Nephrolithiasis EXAM: RETROGRADE PYELOGRAM; DG C-ARM 61-120 MIN COMPARISON:  CT abdomen and pelvis Apr 14, 2017 FLUOROSCOPY TIME:  2 minutes 38 seconds; 7 acquired images FINDINGS: Retrograde filling of both ureters and collecting systems was performed intraoperatively. On the right, there is dilatation of the distal right ureter with an apparent stricture in the ureter at the mid sacral level. There is a rather abrupt band in the proximal right ureter just distal to the right ureteropelvic junction. No calculus is seen on this study on the right. On the left, there is a filling defect in the proximal left ureter consistent with a calculus. There is also a calculus in a lower pole calyx on the left. There is fullness of the left renal pelvis. Double-J stents were subsequently placed bilaterally. IMPRESSION: Calculi in the proximal left ureter in lower pole left renal calyx with double-J stent subsequently placed on the left. On the right, apparent stricture in the right ureter at the mid sacral level. Abrupt turn with possible kink in the proximal right ureter with double-J stent placed on the right. Electronically Signed   By: Lowella Grip III M.D.  On: 05/07/2017 15:14   Ir Nephrostomy Placement Left  Result Date: 05/09/2017 INDICATION: 83 year old male with acute renal failure and bilateral hydronephrosis. The patient has known left-sided nephrolithiasis, with retrograde pyelograms performed recently (05/07/2017) and bilateral ureteral stent placement. Despite stenting hydronephrosis has worsened with worsening renal failure. He has been referred for therapeutic bilateral percutaneous nephrostomy evaluation. EXAM: IR NEPHROSTOMY PLACEMENT LEFT IR NEPHROSTOMY PLACEMENT RIGHT COMPARISON:  None. MEDICATIONS: Ciprofloxacin 400 mg IV; The antibiotic was administered in an  appropriate time frame prior to skin puncture. ANESTHESIA/SEDATION: Fentanyl 100 mcg IV; Versed 2.0 mg IV Moderate Sedation Time:  21 minutes The patient was continuously monitored during the procedure by the interventional radiology nurse under my direct supervision. CONTRAST:  15 cc - administered into the collecting system(s) FLUOROSCOPY TIME:  Fluoroscopy Time: 1 minutes 18 seconds (6 mGy). COMPLICATIONS: None PROCEDURE: Informed written consent was obtained from the patient after a thorough discussion of the procedural risks, benefits and alternatives. All questions were addressed. Maximal Sterile Barrier Technique was utilized including caps, mask, sterile gowns, sterile gloves, sterile drape, hand hygiene and skin antiseptic. A timeout was performed prior to the initiation of the procedure. Left: Patient positioned prone position on the fluoroscopy table. Ultrasound survey of the left flank was performed with images stored and sent to PACs. The patient was then prepped and draped in the usual sterile fashion. 1% lidocaine was used to anesthetize the skin and subcutaneous tissues for local anesthesia. A Chiba needle was then used to access a posterior inferior calyx with ultrasound guidance. With spontaneous urine returned through the needle, passage of an 018 micro wire into the collecting system was performed under fluoroscopy. A small incision was made with an 11 blade scalpel, and the needle was removed from the wire. An Accustick system was then advanced over the wire into the collecting system under fluoroscopy. The metal stiffener and inner dilator were removed, and then a sample of fluid was aspirated through the 4 French outer sheath. Bentson wire was passed into the collecting system and the sheath removed. Ten French dilation of the soft tissues was performed. Using modified Seldinger technique, a 10 French pigtail catheter drain was placed over the Bentson wire. Wire and inner stiffener removed,  and the pigtail was formed in the collecting system. Small amount of contrast confirmed position of the catheter. Right: Ultrasound survey of the right flank was then performed with images stored and sent to PACs. The patient was then prepped and draped in the usual sterile fashion. 1% lidocaine was used to anesthetize the skin and subcutaneous tissues for local anesthesia. A Chiba needle was then used to access a posterior inferior calyx with ultrasound guidance. With spontaneous urine returned through the needle, passage of an 018 micro wire into the collecting system was performed under fluoroscopy. A small incision was made with an 11 blade scalpel, and the needle was removed from the wire. An Accustick system was then advanced over the wire into the collecting system under fluoroscopy. The metal stiffener and inner dilator were removed, and then a sample of fluid was aspirated through the 4 French outer sheath. Bentson wire was passed into the collecting system and the sheath removed. Ten French dilation of the soft tissues was performed. Using modified Seldinger technique, a 10 French pigtail catheter drain was placed over the Bentson wire. Wire and inner stiffener removed, and the pigtail was formed in the collecting system. Small amount of contrast confirmed position of the catheter. Patient tolerated the procedure  well and remained hemodynamically stable throughout. No complications were encountered and no significant blood loss encountered IMPRESSION: Status post bilateral percutaneous nephrostomy placement. Separate culture was sent for each collecting system. Signed, Dulcy Fanny. Earleen Newport, DO [facility] [place] [facility] Radiology Electronically Signed   By: Corrie Mckusick D.O.   On: 05/09/2017 14:35   Ir Nephrostomy Placement Right  Result Date: 05/09/2017 INDICATION: 83 year old male with acute renal failure and bilateral hydronephrosis. The patient has known left-sided  nephrolithiasis, with retrograde pyelograms performed recently (05/07/2017) and bilateral ureteral stent placement. Despite stenting hydronephrosis has worsened with worsening renal failure. He has been referred for therapeutic bilateral percutaneous nephrostomy evaluation. EXAM: IR NEPHROSTOMY PLACEMENT LEFT IR NEPHROSTOMY PLACEMENT RIGHT COMPARISON:  None. MEDICATIONS: Ciprofloxacin 400 mg IV; The antibiotic was administered in an appropriate time frame prior to skin puncture. ANESTHESIA/SEDATION: Fentanyl 100 mcg IV; Versed 2.0 mg IV Moderate Sedation Time:  21 minutes The patient was continuously monitored during the procedure by the interventional radiology nurse under my direct supervision. CONTRAST:  15 cc - administered into the collecting system(s) FLUOROSCOPY TIME:  Fluoroscopy Time: 1 minutes 18 seconds (6 mGy). COMPLICATIONS: None PROCEDURE: Informed written consent was obtained from the patient after a thorough discussion of the procedural risks, benefits and alternatives. All questions were addressed. Maximal Sterile Barrier Technique was utilized including caps, mask, sterile gowns, sterile gloves, sterile drape, hand hygiene and skin antiseptic. A timeout was performed prior to the initiation of the procedure. Left: Patient positioned prone position on the fluoroscopy table. Ultrasound survey of the left flank was performed with images stored and sent to PACs. The patient was then prepped and draped in the usual sterile fashion. 1% lidocaine was used to anesthetize the skin and subcutaneous tissues for local anesthesia. A Chiba needle was then used to access a posterior inferior calyx with ultrasound guidance. With spontaneous urine returned through the needle, passage of an 018 micro wire into the collecting system was performed under fluoroscopy. A small incision was made with an 11 blade scalpel, and the needle was removed from the wire. An Accustick system was then advanced over the wire into the  collecting system under fluoroscopy. The metal stiffener and inner dilator were removed, and then a sample of fluid was aspirated through the 4 French outer sheath. Bentson wire was passed into the collecting system and the sheath removed. Ten French dilation of the soft tissues was performed. Using modified Seldinger technique, a 10 French pigtail catheter drain was placed over the Bentson wire. Wire and inner stiffener removed, and the pigtail was formed in the collecting system. Small amount of contrast confirmed position of the catheter. Right: Ultrasound survey of the right flank was then performed with images stored and sent to PACs. The patient was then prepped and draped in the usual sterile fashion. 1% lidocaine was used to anesthetize the skin and subcutaneous tissues for local anesthesia. A Chiba needle was then used to access a posterior inferior calyx with ultrasound guidance. With spontaneous urine returned through the needle, passage of an 018 micro wire into the collecting system was performed under fluoroscopy. A small incision was made with an 11 blade scalpel, and the needle was removed from the wire. An Accustick system was then advanced over the wire into the collecting system under fluoroscopy. The metal stiffener and inner dilator were removed, and then a sample of fluid was aspirated through the 4 French outer sheath. Bentson wire was passed into the collecting system and the  sheath removed. Ten French dilation of the soft tissues was performed. Using modified Seldinger technique, a 10 French pigtail catheter drain was placed over the Bentson wire. Wire and inner stiffener removed, and the pigtail was formed in the collecting system. Small amount of contrast confirmed position of the catheter. Patient tolerated the procedure well and remained hemodynamically stable throughout. No complications were encountered and no significant blood loss encountered IMPRESSION: Status post bilateral  percutaneous nephrostomy placement. Separate culture was sent for each collecting system. Signed, Dulcy Fanny. Earleen Newport, DO [facility] Radiology Electronically Signed   By: Corrie Mckusick D.O.   On: 05/09/2017 14:35    Labs:  CBC:  Recent Labs  05/07/17 0403 05/08/17 0442 05/09/17 0608 05/10/17 0510  WBC 12.6* 11.9* 13.4* 10.3  HGB 11.7* 11.8* 11.3* 12.6*  HCT 34.5* 35.7* 33.7* 38.1*  PLT 217 202 195 199    COAGS:  Recent Labs  10/26/16 1033  05/08/17 1252 05/09/17 0608 05/09/17 1038 05/09/17 2235  INR 1.02  --   --   --  1.22  --   APTT 29  < > 77* 67* 34 72*  < > = values in this interval not displayed.  BMP:  Recent Labs  05/08/17 0442 05/09/17 0608 05/09/17 1531 05/10/17 0510  NA 139 140 141 142  K 4.8 5.0 5.2* 4.6  CL 112* 114* 113* 111  CO2 18* 17* 14* 18*  GLUCOSE 163* 169* 188* 133*  BUN 86* 95* 99* 76*  CALCIUM 8.5* 8.4* 8.7* 8.7*  CREATININE 3.83* 4.64* 4.95* 3.23*  GFRNONAA 14* 11* 10* 17*  GFRAA 16* 12* 12* 19*    LIVER FUNCTION TESTS:  Recent Labs  10/26/16 1033 05/09/17 1531  BILITOT 1.7*  --   AST 26  --   ALT 24  --   ALKPHOS 69  --   PROT 7.2  --   ALBUMIN 4.1 2.0*    Assessment and Plan:  Bilateral hydronephrosis S/P bil PCNs by Dr. Earleen Newport yesterday Change gauze as needed. Answered family questions to the best of my ability. Care per Urology  Electronically Signed: Murrell Redden, PA-C 05/10/2017, 1:35 PM   I spent a total of 15 Minutes at the the patient's bedside AND on the patient's hospital floor or unit, greater than 50% of which was counseling/coordinating care for f/u after PCNs

## 2017-05-10 NOTE — Progress Notes (Signed)
3 Days Post-Op Subjective: Patient reports no complaints. Sitting on edge of bed.   Objective: Vital signs in last 24 hours: Temp:  [97.7 F (36.5 C)-98 F (36.7 C)] 97.7 F (36.5 C) (06/13 0551) Pulse Rate:  [66-79] 79 (06/13 0551) Resp:  [10-19] 16 (06/13 0551) BP: (138-181)/(68-103) 181/91 (06/13 0551) SpO2:  [95 %-100 %] 97 % (06/13 0551) Weight:  [94.1 kg (207 lb 6.4 oz)-96.7 kg (213 lb 3 oz)] 96.7 kg (213 lb 3 oz) (06/12 2100)  Intake/Output from previous day: 06/12 0701 - 06/13 0700 In: 367.6 [P.O.:180; I.V.:177.6] Out: 3420 [Urine:100; Drains:3320] Intake/Output this shift: No intake/output data recorded.  Physical Exam:  NAD More alert today Nx's in place - urine clear  Lab Results:  Recent Labs  05/08/17 0442 05/09/17 0608 05/10/17 0510  HGB 11.8* 11.3* 12.6*  HCT 35.7* 33.7* 38.1*   BMET  Recent Labs  05/09/17 1531 05/10/17 0510  NA 141 142  K 5.2* 4.6  CL 113* 111  CO2 14* 18*  GLUCOSE 188* 133*  BUN 99* 76*  CREATININE 4.95* 3.23*  CALCIUM 8.7* 8.7*    Recent Labs  05/09/17 1038  INR 1.22   No results for input(s): LABURIN in the last 72 hours. Results for orders placed or performed during the hospital encounter of 05/06/17  Urine Culture     Status: Abnormal   Collection Time: 05/06/17  4:43 PM  Result Value Ref Range Status   Specimen Description URINE, RANDOM  Final   Special Requests NONE  Final   Culture (A)  Final    50,000 COLONIES/mL STAPHYLOCOCCUS SPECIES (COAGULASE NEGATIVE)   Report Status 05/08/2017 FINAL  Final   Organism ID, Bacteria STAPHYLOCOCCUS SPECIES (COAGULASE NEGATIVE) (A)  Final      Susceptibility   Staphylococcus species (coagulase negative) - MIC*    CIPROFLOXACIN >=8 RESISTANT Resistant     GENTAMICIN <=0.5 SENSITIVE Sensitive     NITROFURANTOIN <=16 SENSITIVE Sensitive     OXACILLIN >=4 RESISTANT Resistant     TETRACYCLINE <=1 SENSITIVE Sensitive     VANCOMYCIN <=0.5 SENSITIVE Sensitive      TRIMETH/SULFA <=10 SENSITIVE Sensitive     CLINDAMYCIN >=8 RESISTANT Resistant     RIFAMPIN <=0.5 SENSITIVE Sensitive     Inducible Clindamycin NEGATIVE Sensitive     * 50,000 COLONIES/mL STAPHYLOCOCCUS SPECIES (COAGULASE NEGATIVE)  Culture, blood (routine x 2)     Status: None (Preliminary result)   Collection Time: 05/06/17  4:53 PM  Result Value Ref Range Status   Specimen Description BLOOD RIGHT FOREARM  Final   Special Requests   Final    BOTTLES DRAWN AEROBIC AND ANAEROBIC Blood Culture adequate volume   Culture NO GROWTH 3 DAYS  Final   Report Status PENDING  Incomplete  Culture, blood (routine x 2)     Status: None (Preliminary result)   Collection Time: 05/06/17  5:10 PM  Result Value Ref Range Status   Specimen Description BLOOD LEFT ANTECUBITAL  Final   Special Requests   Final    BOTTLES DRAWN AEROBIC AND ANAEROBIC Blood Culture adequate volume   Culture NO GROWTH 3 DAYS  Final   Report Status PENDING  Incomplete  Surgical pcr screen     Status: None   Collection Time: 05/07/17 10:31 AM  Result Value Ref Range Status   MRSA, PCR NEGATIVE NEGATIVE Final   Staphylococcus aureus NEGATIVE NEGATIVE Final    Comment:        The Xpert SA Assay (FDA  approved for NASAL specimens in patients over 19 years of age), is one component of a comprehensive surveillance program.  Test performance has been validated by [facility] for patients greater than or equal to 74 year old. It is not intended to diagnose infection nor to guide or monitor treatment.     Studies/Results: Dg Abd 1 View  Result Date: 05/09/2017 CLINICAL DATA:  Evaluate stent placement. EXAM: ABDOMEN - 1 VIEW COMPARISON:  CT, 04/14/2017 FINDINGS: There are bilateral ureteral stents, which appear well-positioned, and are stable when compared to the retrograde ureteral pyelogram dated 05/07/2017. Two stones project in the lower pole the left kidney, stable from the prior CT. No other evidence of renal stones. No  evidence of a ureteral stone. Normal bowel gas pattern. No acute skeletal abnormality. IMPRESSION: Stable bilateral ureteral stents when compared to the previous retrograde new ureteral pyelogram. 2 nonobstructing stones in the lower pole the left kidney, also stable. Electronically Signed   By: Lajean Manes M.D.   On: 05/09/2017 12:53   US Renal  Result Date: 05/08/2017 CLINICAL DATA:  Hydronephrosis.  History of prostate cancer. EXAM: RENAL / URINARY TRACT ULTRASOUND COMPLETE COMPARISON:  Ultrasound of May 06, 2017. FINDINGS: Right Kidney: Length: 12.9 cm. Echogenicity within normal limits. Right ureteral stent is not well visualized. No mass visualized. Moderate hydronephrosis is noted. Left Kidney: Length: 14.3 cm. Echogenicity within normal limits. No mass visualized. Moderate hydronephrosis is noted. Left ureteral stent is noted. Bladder: Foley catheter is noted in urinary bladder. Bladder appears to be otherwise normal for degree of distension. IMPRESSION: Moderate bilateral hydronephrosis is noted. Left ureteral stent is noted. Right ureteral stent is not well visualized. Electronically Signed   By: Marijo Conception, M.D.   On: 05/08/2017 12:29   Ir Nephrostomy Placement Left  Result Date: 05/09/2017 INDICATION: 83 year old male with acute renal failure and bilateral hydronephrosis. The patient has known left-sided nephrolithiasis, with retrograde pyelograms performed recently (05/07/2017) and bilateral ureteral stent placement. Despite stenting hydronephrosis has worsened with worsening renal failure. He has been referred for therapeutic bilateral percutaneous nephrostomy evaluation. EXAM: IR NEPHROSTOMY PLACEMENT LEFT IR NEPHROSTOMY PLACEMENT RIGHT COMPARISON:  None. MEDICATIONS: Ciprofloxacin 400 mg IV; The antibiotic was administered in an appropriate time frame prior to skin puncture. ANESTHESIA/SEDATION: Fentanyl 100 mcg IV; Versed 2.0 mg IV Moderate Sedation Time:  21 minutes The patient was  continuously monitored during the procedure by the interventional radiology nurse under my direct supervision. CONTRAST:  15 cc - administered into the collecting system(s) FLUOROSCOPY TIME:  Fluoroscopy Time: 1 minutes 18 seconds (6 mGy). COMPLICATIONS: None PROCEDURE: Informed written consent was obtained from the patient after a thorough discussion of the procedural risks, benefits and alternatives. All questions were addressed. Maximal Sterile Barrier Technique was utilized including caps, mask, sterile gowns, sterile gloves, sterile drape, hand hygiene and skin antiseptic. A timeout was performed prior to the initiation of the procedure. Left: Patient positioned prone position on the fluoroscopy table. Ultrasound survey of the left flank was performed with images stored and sent to PACs. The patient was then prepped and draped in the usual sterile fashion. 1% lidocaine was used to anesthetize the skin and subcutaneous tissues for local anesthesia. A Chiba needle was then used to access a posterior inferior calyx with ultrasound guidance. With spontaneous urine returned through the needle, passage of an 018 micro wire into the collecting system was performed under fluoroscopy. A small incision was made with an 11 blade scalpel, and the needle was  removed from the wire. An Accustick system was then advanced over the wire into the collecting system under fluoroscopy. The metal stiffener and inner dilator were removed, and then a sample of fluid was aspirated through the 4 French outer sheath. Bentson wire was passed into the collecting system and the sheath removed. Ten French dilation of the soft tissues was performed. Using modified Seldinger technique, a 10 French pigtail catheter drain was placed over the Bentson wire. Wire and inner stiffener removed, and the pigtail was formed in the collecting system. Small amount of contrast confirmed position of the catheter. Right: Ultrasound survey of the right flank  was then performed with images stored and sent to PACs. The patient was then prepped and draped in the usual sterile fashion. 1% lidocaine was used to anesthetize the skin and subcutaneous tissues for local anesthesia. A Chiba needle was then used to access a posterior inferior calyx with ultrasound guidance. With spontaneous urine returned through the needle, passage of an 018 micro wire into the collecting system was performed under fluoroscopy. A small incision was made with an 11 blade scalpel, and the needle was removed from the wire. An Accustick system was then advanced over the wire into the collecting system under fluoroscopy. The metal stiffener and inner dilator were removed, and then a sample of fluid was aspirated through the 4 French outer sheath. Bentson wire was passed into the collecting system and the sheath removed. Ten French dilation of the soft tissues was performed. Using modified Seldinger technique, a 10 French pigtail catheter drain was placed over the Bentson wire. Wire and inner stiffener removed, and the pigtail was formed in the collecting system. Small amount of contrast confirmed position of the catheter. Patient tolerated the procedure well and remained hemodynamically stable throughout. No complications were encountered and no significant blood loss encountered IMPRESSION: Status post bilateral percutaneous nephrostomy placement. Separate culture was sent for each collecting system. Signed, Dulcy Fanny. Earleen Newport, DO [facility] Radiology Electronically Signed   By: Corrie Mckusick D.O.   On: 05/09/2017 14:35   Ir Nephrostomy Placement Right  Result Date: 05/09/2017 INDICATION: 83 year old male with acute renal failure and bilateral hydronephrosis. The patient has known left-sided nephrolithiasis, with retrograde pyelograms performed recently (05/07/2017) and bilateral ureteral stent placement. Despite stenting hydronephrosis has worsened  with worsening renal failure. He has been referred for therapeutic bilateral percutaneous nephrostomy evaluation. EXAM: IR NEPHROSTOMY PLACEMENT LEFT IR NEPHROSTOMY PLACEMENT RIGHT COMPARISON:  None. MEDICATIONS: Ciprofloxacin 400 mg IV; The antibiotic was administered in an appropriate time frame prior to skin puncture. ANESTHESIA/SEDATION: Fentanyl 100 mcg IV; Versed 2.0 mg IV Moderate Sedation Time:  21 minutes The patient was continuously monitored during the procedure by the interventional radiology nurse under my direct supervision. CONTRAST:  15 cc - administered into the collecting system(s) FLUOROSCOPY TIME:  Fluoroscopy Time: 1 minutes 18 seconds (6 mGy). COMPLICATIONS: None PROCEDURE: Informed written consent was obtained from the patient after a thorough discussion of the procedural risks, benefits and alternatives. All questions were addressed. Maximal Sterile Barrier Technique was utilized including caps, mask, sterile gowns, sterile gloves, sterile drape, hand hygiene and skin antiseptic. A timeout was performed prior to the initiation of the procedure. Left: Patient positioned prone position on the fluoroscopy table. Ultrasound survey of the left flank was performed with images stored and sent to PACs. The patient was then prepped and draped in the usual sterile fashion. 1% lidocaine was used to anesthetize the skin and subcutaneous  tissues for local anesthesia. A Chiba needle was then used to access a posterior inferior calyx with ultrasound guidance. With spontaneous urine returned through the needle, passage of an 018 micro wire into the collecting system was performed under fluoroscopy. A small incision was made with an 11 blade scalpel, and the needle was removed from the wire. An Accustick system was then advanced over the wire into the collecting system under fluoroscopy. The metal stiffener and inner dilator were removed, and then a sample of fluid was aspirated through the 4 French outer  sheath. Bentson wire was passed into the collecting system and the sheath removed. Ten French dilation of the soft tissues was performed. Using modified Seldinger technique, a 10 French pigtail catheter drain was placed over the Bentson wire. Wire and inner stiffener removed, and the pigtail was formed in the collecting system. Small amount of contrast confirmed position of the catheter. Right: Ultrasound survey of the right flank was then performed with images stored and sent to PACs. The patient was then prepped and draped in the usual sterile fashion. 1% lidocaine was used to anesthetize the skin and subcutaneous tissues for local anesthesia. A Chiba needle was then used to access a posterior inferior calyx with ultrasound guidance. With spontaneous urine returned through the needle, passage of an 018 micro wire into the collecting system was performed under fluoroscopy. A small incision was made with an 11 blade scalpel, and the needle was removed from the wire. An Accustick system was then advanced over the wire into the collecting system under fluoroscopy. The metal stiffener and inner dilator were removed, and then a sample of fluid was aspirated through the 4 French outer sheath. Bentson wire was passed into the collecting system and the sheath removed. Ten French dilation of the soft tissues was performed. Using modified Seldinger technique, a 10 French pigtail catheter drain was placed over the Bentson wire. Wire and inner stiffener removed, and the pigtail was formed in the collecting system. Small amount of contrast confirmed position of the catheter. Patient tolerated the procedure well and remained hemodynamically stable throughout. No complications were encountered and no significant blood loss encountered IMPRESSION: Status post bilateral percutaneous nephrostomy placement. Separate culture was sent for each collecting system. Signed, Dulcy Fanny. Earleen Newport, DO [facility] Radiology Electronically Signed   By: Corrie Mckusick D.O.   On: 05/09/2017 14:35    Assessment/Plan: -bilateral hydro - failed stents 6/10, s/p bilateral Nx tubes 6/13 with excellent UOP, return of more normal mentation and improving Cr. I suspect he may have an end stage bladder. Will consider repeat cysto, RGP's/stent removal as outpt when he's stable.  -gross hematuria - expected with stents, foley, radiated bladder, heparin gtt. Not clinically significant - H/H better today.     LOS: 4 days   William [name] 05/10/2017, 7:47 AM

## 2017-05-10 NOTE — Progress Notes (Signed)
ANTICOAGULATION CONSULT NOTE - Follow Up Consult  Pharmacy Consult for heparin (apixaban on hold) Indication: atrial fibrillation  Allergies  Allergen Reactions  . Glyburide Other (See Comments)    Hypoglycemia     Patient Measurements: Height: 6\' 1"  (185.4 cm) Weight: 213 lb 3 oz (96.7 kg) IBW/kg (Calculated) : 79.9  Vital Signs: Temp: 97.7 F (36.5 C) (06/13 0551) Temp Source: Oral (06/13 0551) BP: 181/91 (06/13 0551) Pulse Rate: 79 (06/13 0551)  Labs:  Recent Labs  05/08/17 0142  05/08/17 0442  05/09/17 0608 05/09/17 1038 05/09/17 1531 05/09/17 2235 05/10/17 0510  HGB  --   < > 11.8*  --  11.3*  --   --   --  12.6*  HCT  --   --  35.7*  --  33.7*  --   --   --  38.1*  PLT  --   --  202  --  195  --   --   --  199  APTT 80*  --   --   < > 67* 34  --  72*  --   LABPROT  --   --   --   --   --  15.5*  --   --   --   INR  --   --   --   --   --  1.22  --   --   --   HEPARINUNFRC 0.95*  --   --   --   --   --   --  0.33 0.31  CREATININE  --   --  3.83*  --  4.64*  --  4.95*  --  3.23*  < > = values in this interval not displayed.  Estimated Creatinine Clearance: 22.3 mL/min (A) (by C-G formula based on SCr of 3.23 mg/dL (H)).   Assessment: 83 y/o M on a heparin drip for Afib while apixaban on hold. He is now s/p bilateral ureteral stents for hydronephrosis and AKI, now s/p bilateral nephrostomy tubes.   Heparin level is therapeutic at 0.31 on the low end of normal - will increase slightly to keep in therapeutic range. No bleeding noted, Hgb stable, platelets are normal.  Goal of Therapy:  Heparin level 0.3-0.7 units/ml Monitor platelets by anticoagulation protocol: Yes   Plan:  - Increase heparin drip to 1250 units/hr - Daily heparin level, CBC - Monitor for s/sx of bleeding - F/U plan to change back to apixaban   Renold Genta, PharmD, BCPS Clinical Pharmacist Phone for today - [place] - [phone] 05/10/2017 11:27 AM

## 2017-05-10 NOTE — Progress Notes (Signed)
Family member requesting for the patient to help him rest. Patient seems restless. Will update NP.

## 2017-05-11 LAB — CBC
HCT: 39.7 % (ref 39.0–52.0)
Hemoglobin: 12.8 g/dL — ABNORMAL LOW (ref 13.0–17.0)
MCH: 29.6 pg (ref 26.0–34.0)
MCHC: 32.2 g/dL (ref 30.0–36.0)
MCV: 91.9 fL (ref 78.0–100.0)
Platelets: 219 10*3/uL (ref 150–400)
RBC: 4.32 MIL/uL (ref 4.22–5.81)
RDW: 14.5 % (ref 11.5–15.5)
WBC: 13.9 10*3/uL — ABNORMAL HIGH (ref 4.0–10.5)

## 2017-05-11 LAB — CULTURE, BLOOD (ROUTINE X 2)
CULTURE: NO GROWTH
Culture: NO GROWTH
SPECIAL REQUESTS: ADEQUATE
SPECIAL REQUESTS: ADEQUATE

## 2017-05-11 LAB — HEPARIN LEVEL (UNFRACTIONATED): HEPARIN UNFRACTIONATED: 0.4 [IU]/mL (ref 0.30–0.70)

## 2017-05-11 LAB — IRON AND TIBC
IRON: 77 ug/dL (ref 45–182)
Saturation Ratios: 50 % — ABNORMAL HIGH (ref 17.9–39.5)
TIBC: 154 ug/dL — AB (ref 250–450)
UIBC: 77 ug/dL

## 2017-05-11 LAB — RENAL FUNCTION PANEL
ANION GAP: 10 (ref 5–15)
Albumin: 2.1 g/dL — ABNORMAL LOW (ref 3.5–5.0)
BUN: 58 mg/dL — ABNORMAL HIGH (ref 6–20)
CALCIUM: 8.9 mg/dL (ref 8.9–10.3)
CHLORIDE: 113 mmol/L — AB (ref 101–111)
CO2: 23 mmol/L (ref 22–32)
CREATININE: 2.03 mg/dL — AB (ref 0.61–1.24)
GFR calc non Af Amer: 29 mL/min — ABNORMAL LOW (ref >60)
GFR, EST AFRICAN AMERICAN: 34 mL/min — AB (ref >60)
GLUCOSE: 149 mg/dL — AB (ref 65–99)
Phosphorus: 3.1 mg/dL (ref 2.5–4.6)
Potassium: 4.6 mmol/L (ref 3.5–5.1)
SODIUM: 146 mmol/L — AB (ref 135–145)

## 2017-05-11 LAB — GLUCOSE, CAPILLARY
Glucose-Capillary: 158 mg/dL — ABNORMAL HIGH (ref 65–99)
Glucose-Capillary: 211 mg/dL — ABNORMAL HIGH (ref 65–99)
Glucose-Capillary: 152 mg/dL — ABNORMAL HIGH (ref 65–99)
Glucose-Capillary: 159 mg/dL — ABNORMAL HIGH (ref 65–99)

## 2017-05-11 LAB — VITAMIN B12: VITAMIN B 12: 426 pg/mL (ref 180–914)

## 2017-05-11 LAB — FERRITIN: Ferritin: 581 ng/mL — ABNORMAL HIGH (ref 24–336)

## 2017-05-11 MED ORDER — LORAZEPAM 1 MG PO TABS
1.0000 mg | ORAL_TABLET | Freq: Once | ORAL | Status: AC
Start: 2017-05-11 — End: 2017-05-11
  Administered 2017-05-11: 1 mg via ORAL
  Filled 2017-05-11: qty 1

## 2017-05-11 MED ORDER — APIXABAN 2.5 MG PO TABS
2.5000 mg | ORAL_TABLET | Freq: Two times a day (BID) | ORAL | Status: DC
  Administered 2017-05-11 (×2): 2.5 mg via ORAL
  Filled 2017-05-11 (×2): qty 1

## 2017-05-11 MED ORDER — LORAZEPAM 1 MG PO TABS
1.0000 mg | ORAL_TABLET | ORAL | Status: DC | PRN
  Administered 2017-05-12: 1 mg via ORAL
  Filled 2017-05-11: qty 1

## 2017-05-11 MED ORDER — AMLODIPINE BESYLATE 5 MG PO TABS: 5.0000 mg | ORAL_TABLET | Freq: Every day | ORAL | Status: DC

## 2017-05-11 MED ORDER — TRAZODONE HCL 50 MG PO TABS
50.0000 mg | ORAL_TABLET | Freq: Every day | ORAL | Status: DC
  Administered 2017-05-11 – 2017-05-12 (×2): 50 mg via ORAL
  Filled 2017-05-11 (×2): qty 1

## 2017-05-11 MED ORDER — AMLODIPINE BESYLATE 5 MG PO TABS
5.0000 mg | ORAL_TABLET | Freq: Every day | ORAL | Status: DC
  Administered 2017-05-11 – 2017-05-12 (×2): 5 mg via ORAL
  Filled 2017-05-11 (×2): qty 1

## 2017-05-11 NOTE — Progress Notes (Signed)
Subjective: Interval History: Had bad night.  Objective: Vital signs in last 24 hours: Temp:  [97.4 F (36.3 C)-97.7 F (36.5 C)] 97.7 F (36.5 C) (06/14 0911) Pulse Rate:  [74-95] 84 (06/14 0911) Resp:  [16-17] 17 (06/14 0911) BP: (159-183)/(77-97) 172/97 (06/14 0911) SpO2:  [95 %-100 %] 97 % (06/14 0911) Weight:  [82.3 kg (181 lb 8 oz)] 82.3 kg (181 lb 8 oz) (06/14 0229) Weight change: -11.748 kg (-25 lb 14.4 oz)  Intake/Output from previous day: 06/13 0701 - 06/14 0700 In: 1024.5 [P.O.:720; I.V.:294.5] Out: 3180 [Urine:450; Drains:2730] Intake/Output this shift: Total I/O In: 120 [P.O.:120] Out: 0   General appearance: pale, slowed mentation and arouses with difficulty Resp: diminished breath sounds bilaterally and rales bibasilar Cardio: irregularly irregular rhythm and systolic murmur: systolic ejection 2/6, decrescendo at 2nd left intercostal space GI: pos bs, soft, bilat flank PCNx Extremities: extremities normal, atraumatic, no cyanosis or edema  Lab Results:  Recent Labs  05/10/17 0510 05/11/17 0307  WBC 10.3 13.9*  HGB 12.6* 12.8*  HCT 38.1* 39.7  PLT 199 219   BMET:  Recent Labs  05/10/17 0510 05/11/17 0307  NA 142 146*  K 4.6 4.6  CL 111 113*  CO2 18* 23  GLUCOSE 133* 149*  BUN 76* 58*  CREATININE 3.23* 2.03*  CALCIUM 8.7* 8.9   No results for input(s): PTH in the last 72 hours. Iron Studies: No results for input(s): IRON, TIBC, TRANSFERRIN, FERRITIN in the last 72 hours.  Studies/Results: Dg Abd 1 View  Result Date: 05/09/2017 CLINICAL DATA:  Evaluate stent placement. EXAM: ABDOMEN - 1 VIEW COMPARISON:  CT, 04/14/2017 FINDINGS: There are bilateral ureteral stents, which appear well-positioned, and are stable when compared to the retrograde ureteral pyelogram dated 05/07/2017. Two stones project in the lower pole the left kidney, stable from the prior CT. No other evidence of renal stones. No evidence of a ureteral stone. Normal bowel gas  pattern. No acute skeletal abnormality. IMPRESSION: Stable bilateral ureteral stents when compared to the previous retrograde new ureteral pyelogram. 2 nonobstructing stones in the lower pole the left kidney, also stable. Electronically Signed   By: Lajean Manes M.D.   On: 05/09/2017 12:53   Ir Nephrostomy Placement Left  Result Date: 05/09/2017 INDICATION: 83 year old male with acute renal failure and bilateral hydronephrosis. The patient has known left-sided nephrolithiasis, with retrograde pyelograms performed recently (05/07/2017) and bilateral ureteral stent placement. Despite stenting hydronephrosis has worsened with worsening renal failure. He has been referred for therapeutic bilateral percutaneous nephrostomy evaluation. EXAM: IR NEPHROSTOMY PLACEMENT LEFT IR NEPHROSTOMY PLACEMENT RIGHT COMPARISON:  None. MEDICATIONS: Ciprofloxacin 400 mg IV; The antibiotic was administered in an appropriate time frame prior to skin puncture. ANESTHESIA/SEDATION: Fentanyl 100 mcg IV; Versed 2.0 mg IV Moderate Sedation Time:  21 minutes The patient was continuously monitored during the procedure by the interventional radiology nurse under my direct supervision. CONTRAST:  15 cc - administered into the collecting system(s) FLUOROSCOPY TIME:  Fluoroscopy Time: 1 minutes 18 seconds (6 mGy). COMPLICATIONS: None PROCEDURE: Informed written consent was obtained from the patient after a thorough discussion of the procedural risks, benefits and alternatives. All questions were addressed. Maximal Sterile Barrier Technique was utilized including caps, mask, sterile gowns, sterile gloves, sterile drape, hand hygiene and skin antiseptic. A timeout was performed prior to the initiation of the procedure. Left: Patient positioned prone position on the fluoroscopy table. Ultrasound survey of the left flank was performed with images stored and sent to PACs. The patient was  then prepped and draped in the usual sterile fashion. 1%  lidocaine was used to anesthetize the skin and subcutaneous tissues for local anesthesia. A Chiba needle was then used to access a posterior inferior calyx with ultrasound guidance. With spontaneous urine returned through the needle, passage of an 018 micro wire into the collecting system was performed under fluoroscopy. A small incision was made with an 11 blade scalpel, and the needle was removed from the wire. An Accustick system was then advanced over the wire into the collecting system under fluoroscopy. The metal stiffener and inner dilator were removed, and then a sample of fluid was aspirated through the 4 French outer sheath. Bentson wire was passed into the collecting system and the sheath removed. Ten French dilation of the soft tissues was performed. Using modified Seldinger technique, a 10 French pigtail catheter drain was placed over the Bentson wire. Wire and inner stiffener removed, and the pigtail was formed in the collecting system. Small amount of contrast confirmed position of the catheter. Right: Ultrasound survey of the right flank was then performed with images stored and sent to PACs. The patient was then prepped and draped in the usual sterile fashion. 1% lidocaine was used to anesthetize the skin and subcutaneous tissues for local anesthesia. A Chiba needle was then used to access a posterior inferior calyx with ultrasound guidance. With spontaneous urine returned through the needle, passage of an 018 micro wire into the collecting system was performed under fluoroscopy. A small incision was made with an 11 blade scalpel, and the needle was removed from the wire. An Accustick system was then advanced over the wire into the collecting system under fluoroscopy. The metal stiffener and inner dilator were removed, and then a sample of fluid was aspirated through the 4 French outer sheath. Bentson wire was passed into the collecting system and the sheath removed. Ten French dilation of the soft  tissues was performed. Using modified Seldinger technique, a 10 French pigtail catheter drain was placed over the Bentson wire. Wire and inner stiffener removed, and the pigtail was formed in the collecting system. Small amount of contrast confirmed position of the catheter. Patient tolerated the procedure well and remained hemodynamically stable throughout. No complications were encountered and no significant blood loss encountered IMPRESSION: Status post bilateral percutaneous nephrostomy placement. Separate culture was sent for each collecting system. Signed, Dulcy Fanny. Earleen Newport, DO [facility] Electronically Signed   By: Corrie Mckusick D.O.   On: 05/09/2017 14:35   Ir Nephrostomy Placement Right  Result Date: 05/09/2017 INDICATION: 83 year old male with acute renal failure and bilateral hydronephrosis. The patient has known left-sided nephrolithiasis, with retrograde pyelograms performed recently (05/07/2017) and bilateral ureteral stent placement. Despite stenting hydronephrosis has worsened with worsening renal failure. He has been referred for therapeutic bilateral percutaneous nephrostomy evaluation. EXAM: IR NEPHROSTOMY PLACEMENT LEFT IR NEPHROSTOMY PLACEMENT RIGHT COMPARISON:  None. MEDICATIONS: Ciprofloxacin 400 mg IV; The antibiotic was administered in an appropriate time frame prior to skin puncture. ANESTHESIA/SEDATION: Fentanyl 100 mcg IV; Versed 2.0 mg IV Moderate Sedation Time:  21 minutes The patient was continuously monitored during the procedure by the interventional radiology nurse under my direct supervision. CONTRAST:  15 cc - administered into the collecting system(s) FLUOROSCOPY TIME:  Fluoroscopy Time: 1 minutes 18 seconds (6 mGy). COMPLICATIONS: None PROCEDURE: Informed written consent was obtained from the patient after a thorough discussion of the procedural risks, benefits and alternatives. All questions were addressed. Maximal  Sterile Barrier  Technique was utilized including caps, mask, sterile gowns, sterile gloves, sterile drape, hand hygiene and skin antiseptic. A timeout was performed prior to the initiation of the procedure. Left: Patient positioned prone position on the fluoroscopy table. Ultrasound survey of the left flank was performed with images stored and sent to PACs. The patient was then prepped and draped in the usual sterile fashion. 1% lidocaine was used to anesthetize the skin and subcutaneous tissues for local anesthesia. A Chiba needle was then used to access a posterior inferior calyx with ultrasound guidance. With spontaneous urine returned through the needle, passage of an 018 micro wire into the collecting system was performed under fluoroscopy. A small incision was made with an 11 blade scalpel, and the needle was removed from the wire. An Accustick system was then advanced over the wire into the collecting system under fluoroscopy. The metal stiffener and inner dilator were removed, and then a sample of fluid was aspirated through the 4 French outer sheath. Bentson wire was passed into the collecting system and the sheath removed. Ten French dilation of the soft tissues was performed. Using modified Seldinger technique, a 10 French pigtail catheter drain was placed over the Bentson wire. Wire and inner stiffener removed, and the pigtail was formed in the collecting system. Small amount of contrast confirmed position of the catheter. Right: Ultrasound survey of the right flank was then performed with images stored and sent to PACs. The patient was then prepped and draped in the usual sterile fashion. 1% lidocaine was used to anesthetize the skin and subcutaneous tissues for local anesthesia. A Chiba needle was then used to access a posterior inferior calyx with ultrasound guidance. With spontaneous urine returned through the needle, passage of an 018 micro wire into the collecting system was performed under  fluoroscopy. A small incision was made with an 11 blade scalpel, and the needle was removed from the wire. An Accustick system was then advanced over the wire into the collecting system under fluoroscopy. The metal stiffener and inner dilator were removed, and then a sample of fluid was aspirated through the 4 French outer sheath. Bentson wire was passed into the collecting system and the sheath removed. Ten French dilation of the soft tissues was performed. Using modified Seldinger technique, a 10 French pigtail catheter drain was placed over the Bentson wire. Wire and inner stiffener removed, and the pigtail was formed in the collecting system. Small amount of contrast confirmed position of the catheter. Patient tolerated the procedure well and remained hemodynamically stable throughout. No complications were encountered and no significant blood loss encountered IMPRESSION: Status post bilateral percutaneous nephrostomy placement. Separate culture was sent for each collecting system. Signed, Dulcy Fanny. Earleen Newport, DO [facility] Electronically Signed   By: Corrie Mckusick D.O.   On: 05/09/2017 14:35    I have reviewed the patient's current medications.  Assessment/Plan: 1 AKI improving. SNa rising, diuresis.  C/w post obstructive diuresis 2 Prostate Ca 3 Scarred up bladder 4 Sundowning 5 DM controlled 6 HTN 7 afib P push po fluids, PCNx,      LOS: 5 days   Vicky Mccanless L 05/11/2017,9:25 AM

## 2017-05-11 NOTE — Progress Notes (Signed)
Pt has received his scheduled ativan 1 mg po for sleep but remains restless. Pt and family want to know if he can have another dose to help him sleep. Page to K. Schorr for notification. Dorthey Sawyer, RN

## 2017-05-11 NOTE — Progress Notes (Signed)
ANTICOAGULATION CONSULT NOTE - Follow Up Consult  Pharmacy Consult for heparin (apixaban on hold) Indication: atrial fibrillation  Allergies  Allergen Reactions  . Glyburide Other (See Comments)    Hypoglycemia     Patient Measurements: Height: 6\' 1"  (185.4 cm) Weight: 181 lb 8 oz (82.3 kg) IBW/kg (Calculated) : 79.9  Vital Signs: Temp: 97.4 F (36.3 C) (06/14 0522) Temp Source: Oral (06/14 0522) BP: 183/94 (06/14 0522) Pulse Rate: 95 (06/14 0522)  Labs:  Recent Labs  05/09/17 0608 05/09/17 1038 05/09/17 1531 05/09/17 2235 05/10/17 0510 05/11/17 0307  HGB 11.3*  --   --   --  12.6* 12.8*  HCT 33.7*  --   --   --  38.1* 39.7  PLT 195  --   --   --  199 219  APTT 67* 34  --  72*  --   --   LABPROT  --  15.5*  --   --   --   --   INR  --  1.22  --   --   --   --   HEPARINUNFRC  --   --   --  0.33 0.31 0.40  CREATININE 4.64*  --  4.95*  --  3.23* 2.03*    Estimated Creatinine Clearance: 32.8 mL/min (A) (by C-G formula based on SCr of 2.03 mg/dL (H)).   Assessment: 83 y/o M on a heparin drip for Afib while apixaban on hold. He is now s/p bilateral ureteral stents for hydronephrosis and AKI, now s/p bilateral nephrostomy tubes.   Heparin level is therapeutic this am at 0.40. CBC stable.   Goal of Therapy:  Heparin level 0.3-0.7 units/ml Monitor platelets by anticoagulation protocol: Yes   Plan:  - Continue heparin drip at 1250 units/hr - Daily heparin level, CBC - Monitor for s/sx of bleeding - F/U plan to change back to apixaban   Vincenza Hews, PharmD, BCPS 05/11/2017, 8:00 AM Phone for today - Ernest - [phone]

## 2017-05-11 NOTE — Progress Notes (Signed)
4 Days Post-Op   Assessment/Plan: -bilateral hydro - failed stents 6/10, s/p bilateral Nx tubes 6/13 with excellent UOP. Cr normalizing.  -gross hematuria/blood per urethra - expected with stents, foley, radiated bladder, anticoagulation. Not clinically significant. H/H higher today.   Pt cleared for D/c from GU pt of view when medically stable. I have appt with him scheduled for next week 05/18/2017 in the office.   Subjective: Patient reports no complaints. Family said he's sleepy today. Family noted BR blood per urethra today.  Objective: Vital signs in last 24 hours: Temp:  [97.4 F (36.3 C)-97.9 F (36.6 C)] 97.9 F (36.6 C) (06/14 1728) Pulse Rate:  [78-95] 84 (06/14 1728) Resp:  [16-17] 17 (06/14 0911) BP: (159-183)/(88-97) 167/90 (06/14 1728) SpO2:  [95 %-100 %] 97 % (06/14 1728) Weight:  [82.3 kg (181 lb 8 oz)] 82.3 kg (181 lb 8 oz) (06/14 0229)  Intake/Output from previous day: 06/13 0701 - 06/14 0700 In: 1024.5 [P.O.:720; I.V.:294.5] Out: 3180 [Urine:450; Drains:2730] Intake/Output this shift: Total I/O In: 380 [P.O.:360; Other:20] Out: 950 [Drains:950]  Physical Exam:  NAD Sleeping in recliner but easily arousable and no focal deficits Abd - soft NT Nx tubes in place with clear urine  Lab Results:  Recent Labs  05/09/17 0608 05/10/17 0510 05/11/17 0307  HGB 11.3* 12.6* 12.8*  HCT 33.7* 38.1* 39.7   BMET  Recent Labs  05/10/17 0510 05/11/17 0307  NA 142 146*  K 4.6 4.6  CL 111 113*  CO2 18* 23  GLUCOSE 133* 149*  BUN 76* 58*  CREATININE 3.23* 2.03*  CALCIUM 8.7* 8.9    Recent Labs  05/09/17 1038  INR 1.22   No results for input(s): LABURIN in the last 72 hours. Results for orders placed or performed during the hospital encounter of 05/06/17  Urine Culture     Status: Abnormal   Collection Time: 05/06/17  4:43 PM  Result Value Ref Range Status   Specimen Description URINE, RANDOM  Final   Special Requests NONE  Final   Culture (A)   Final    50,000 COLONIES/mL STAPHYLOCOCCUS SPECIES (COAGULASE NEGATIVE)   Report Status 05/08/2017 FINAL  Final   Organism ID, Bacteria STAPHYLOCOCCUS SPECIES (COAGULASE NEGATIVE) (A)  Final      Susceptibility   Staphylococcus species (coagulase negative) - MIC*    CIPROFLOXACIN >=8 RESISTANT Resistant     GENTAMICIN <=0.5 SENSITIVE Sensitive     NITROFURANTOIN <=16 SENSITIVE Sensitive     OXACILLIN >=4 RESISTANT Resistant     TETRACYCLINE <=1 SENSITIVE Sensitive     VANCOMYCIN <=0.5 SENSITIVE Sensitive     TRIMETH/SULFA <=10 SENSITIVE Sensitive     CLINDAMYCIN >=8 RESISTANT Resistant     RIFAMPIN <=0.5 SENSITIVE Sensitive     Inducible Clindamycin NEGATIVE Sensitive     * 50,000 COLONIES/mL STAPHYLOCOCCUS SPECIES (COAGULASE NEGATIVE)  Culture, blood (routine x 2)     Status: None   Collection Time: 05/06/17  4:53 PM  Result Value Ref Range Status   Specimen Description BLOOD RIGHT FOREARM  Final   Special Requests   Final    BOTTLES DRAWN AEROBIC AND ANAEROBIC Blood Culture adequate volume   Culture NO GROWTH 5 DAYS  Final   Report Status 05/11/2017 FINAL  Final  Culture, blood (routine x 2)     Status: None   Collection Time: 05/06/17  5:10 PM  Result Value Ref Range Status   Specimen Description BLOOD LEFT ANTECUBITAL  Final   Special Requests  Final    BOTTLES DRAWN AEROBIC AND ANAEROBIC Blood Culture adequate volume   Culture NO GROWTH 5 DAYS  Final   Report Status 05/11/2017 FINAL  Final  Surgical pcr screen     Status: None   Collection Time: 05/07/17 10:31 AM  Result Value Ref Range Status   MRSA, PCR NEGATIVE NEGATIVE Final   Staphylococcus aureus NEGATIVE NEGATIVE Final    Comment:        The Xpert SA Assay (FDA approved for NASAL specimens in patients over 64 years of age), is one component of a comprehensive surveillance program.  Test performance has been validated by [facility] for patients greater than or equal to 75 year old. It is not intended to  diagnose infection nor to guide or monitor treatment.   Culture, Urine     Status: None   Collection Time: 05/09/17  2:16 PM  Result Value Ref Range Status   Specimen Description KIDNEY LEFT  Final   Special Requests NONE  Final   Culture NO GROWTH  Final   Report Status 05/10/2017 FINAL  Final  Culture, Urine     Status: None   Collection Time: 05/09/17  2:16 PM  Result Value Ref Range Status   Specimen Description KIDNEY RIGHT  Final   Special Requests NONE  Final   Culture NO GROWTH  Final   Report Status 05/10/2017 FINAL  Final    Studies/Results: No results found.    LOS: 5 days   Nakayla Rorabaugh 05/11/2017, 6:56 PM

## 2017-05-11 NOTE — Progress Notes (Signed)
Physical Therapy Treatment Patient Details Name: William [name] MRN: [id number] DOB: 02-24-1934 Today's Date: 05/11/2017    History of Present Illness William [name] is a 83 y.o. male with a history of AFib on eliquis, CVA in Nov 2017, HTN, IDDM, prostate CA,and  OA s/p bilateral TKA who was brought to the ED by his family due to progressive generalized weakness leading to recurrent falls. Complex UTI, bil hydronephrosis, urethral stent trial failed, now with bilateral nephrostomy drains    PT Comments    Pt continues to require increased assistance for sit to stand and stand pivot transfer from bed to [facility]. Requires Min a for safety with gait this session with increased fatigue noted following. Pt continues to benefit from CIR at discharge in order to maximize his outcomes before return home with his family.     Follow Up Recommendations  CIR     Equipment Recommendations  Rolling walker with 5" wheels    Recommendations for Other Services       Precautions / Restrictions Precautions Precautions: Fall Restrictions Weight Bearing Restrictions: No    Mobility  Bed Mobility Overal bed mobility: Needs Assistance Bed Mobility: Supine to Sit     Supine to sit: Min assist     General bed mobility comments: Min A to sit upright at EOB  Transfers Overall transfer level: Needs assistance Equipment used: Rolling walker (2 wheeled) Transfers: Sit to/from [facility] Sit to Stand: Mod assist Stand pivot transfers: Mod assist       General transfer comment: Mod A to power up from edge of bed and to transfer to [facility] for equiment management  Ambulation/Gait Ambulation/Gait assistance: Min assist   Assistive device: Rolling walker (2 wheeled) Gait Pattern/deviations: Step-through pattern Gait velocity: decreased Gait velocity interpretation: Below normal speed for age/gender General Gait Details: cues for proximity to RW, and upright posture as he  fatigues.   Stairs            Wheelchair Mobility    Modified Rankin (Stroke Patients Only)       Balance Overall balance assessment: Needs assistance Sitting-balance support: No upper extremity supported;Feet supported Sitting balance-Leahy Scale: Fair     Standing balance support: Bilateral upper extremity supported Standing balance-Leahy Scale: Poor Standing balance comment: reliant on RW for stability in standing                            Cognition Arousal/Alertness: Awake/alert Behavior During Therapy: WFL for tasks assessed/performed Overall Cognitive Status: Within Functional Limits for tasks assessed                                        Exercises General Exercises - Lower Extremity Ankle Circles/Pumps: AROM;Both;10 reps;Seated Long Arc Quad: AROM;Both;10 reps;Seated Hip Flexion/Marching: AROM;Both;10 reps;Seated    General Comments        Pertinent Vitals/Pain Pain Assessment: No/denies pain    Home Living                      Prior Function            PT Goals (current goals can now be found in the care plan section) Acute Rehab PT Goals Patient Stated Goal: back to normal Progress towards PT goals: Progressing toward goals    Frequency    Min 3X/week  PT Plan Discharge plan needs to be updated    Co-evaluation              AM-PAC PT "6 Clicks" Daily Activity  Outcome Measure  Difficulty turning over in bed (including adjusting bedclothes, sheets and blankets)?: Total Difficulty moving from lying on back to sitting on the side of the bed? : Total Difficulty sitting down on and standing up from a chair with arms (e.g., wheelchair, bedside commode, etc,.)?: Total Help needed moving to and from a bed to chair (including a wheelchair)?: A Little Help needed walking in hospital room?: A Little Help needed climbing 3-5 steps with a railing? : A Lot 6 Click Score: 11    End of Session  Equipment Utilized During Treatment: Gait belt Activity Tolerance: Patient tolerated treatment well Patient left: in chair;with call bell/phone within reach;with family/visitor present Nurse Communication: Mobility status PT Visit Diagnosis: Unsteadiness on feet (R26.81);Other abnormalities of gait and mobility (R26.89);Muscle weakness (generalized) (M62.81)     Time: 7414-2395 PT Time Calculation (min) (ACUTE ONLY): 33 min  Charges:  $Gait Training: 8-22 mins $Therapeutic Activity: 8-22 mins                    G Codes:       William [name] PT, DPT  [phone]    William [name] 05/11/2017, 1:44 PM

## 2017-05-11 NOTE — Progress Notes (Signed)
Referring Physician(s): Eskridge,M  Supervising Physician: Sandi Mariscal  Patient Status:  [facility] - In-pt  Chief Complaint: Bilateral hydronephrosis   Subjective: Pt doing ok today; family in room; denies worsening flank pain,N/V   Allergies: Glyburide  Medications: Prior to Admission medications   Medication Sig Start Date End Date Taking? Authorizing Provider  acetaminophen (TYLENOL) 325 MG tablet Take 325-650 mg by mouth every 6 (six) hours as needed for mild pain.   Yes [provider]  amoxicillin (AMOXIL) 500 MG capsule Take 2,000 mg by mouth See admin instructions. ONE HOUR PRIOR TO DENTAL PROCEDURES 12/21/15  Yes [provider]  apixaban (ELIQUIS) 5 MG TABS tablet Take 1 tablet (5 mg total) by mouth 2 (two) times daily. 10/28/16  Yes Ghimire, Henreitta Leber, MD  Coenzyme Q10 (CO Q-10) 300 MG CAPS Take 300 mg by mouth daily with breakfast.   Yes [provider]  Glucosamine-Chondroit-Vit C-Mn (GLUCOSAMINE-CHONDROITIN) TABS Take 1 tablet by mouth 2 (two) times daily.   Yes [provider]  HUMALOG KWIKPEN 100 UNIT/ML KiwkPen Inject 6 Units into the skin 3 (three) times daily before meals.  12/11/15  Yes [provider]  loperamide (IMODIUM A-D) 2 MG tablet Take 2 mg by mouth 4 (four) times daily as needed for diarrhea or loose stools.   Yes [provider]  ONGLYZA 5 MG TABS tablet Take 5 mg by mouth every morning.  05/11/15  Yes [provider]  pravastatin (PRAVACHOL) 40 MG tablet Take 40 mg by mouth daily with supper.    Yes [provider]  LEVEMIR FLEXTOUCH 100 UNIT/ML Pen Inject 16 Units into the skin at bedtime.  08/08/16   [provider]  lisinopril-hydrochlorothiazide (PRINZIDE,ZESTORETIC) 10-12.5 MG per tablet Take 1 tablet by mouth every morning.     [provider]  metFORMIN (GLUCOPHAGE) 1000 MG tablet Take 1,000 mg by mouth 2 (two) times daily with a meal.    [provider]      Vital Signs: BP (!) 172/97 (BP Location: Left Arm)   Pulse 84   Temp 97.7 F (36.5 C) (Oral)   Resp 17   Ht 6\' 1"  (1.854 m)   Wt 181 lb 8 oz (82.3 kg)   SpO2 97%   BMI 23.95 kg/m   Physical Exam bilat PCN's intact, outputs 860/1870 cc L/R respectively amber to tea colored urine; cx neg; sites NT  Imaging: Dg Abd 1 View  Result Date: 05/09/2017 CLINICAL DATA:  Evaluate stent placement. EXAM: ABDOMEN - 1 VIEW COMPARISON:  CT, 04/14/2017 FINDINGS: There are bilateral ureteral stents, which appear well-positioned, and are stable when compared to the retrograde ureteral pyelogram dated 05/07/2017. Two stones project in the lower pole the left kidney, stable from the prior CT. No other evidence of renal stones. No evidence of a ureteral stone. Normal bowel gas pattern. No acute skeletal abnormality. IMPRESSION: Stable bilateral ureteral stents when compared to the previous retrograde new ureteral pyelogram. 2 nonobstructing stones in the lower pole the left kidney, also stable. Electronically Signed   By: Lajean Manes M.D.   On: 05/09/2017 12:53   Dg Retrograde Pyelogram  Result Date: 05/07/2017 CLINICAL DATA:  Nephrolithiasis EXAM: RETROGRADE PYELOGRAM; DG C-ARM 61-120 MIN COMPARISON:  CT abdomen and pelvis Apr 14, 2017 FLUOROSCOPY TIME:  2 minutes 38 seconds; 7 acquired images FINDINGS: Retrograde filling of both ureters and collecting systems was performed intraoperatively. On the right, there is dilatation of the distal right ureter with an apparent  stricture in the ureter at the mid sacral level. There is a rather abrupt band in the proximal right ureter just distal to the right ureteropelvic junction. No calculus is seen on this study on the right. On the left, there is a filling defect in the proximal left ureter consistent with a calculus. There is also a calculus in a lower pole calyx on the left. There is fullness of the left renal pelvis. Double-J stents were subsequently placed  bilaterally. IMPRESSION: Calculi in the proximal left ureter in lower pole left renal calyx with double-J stent subsequently placed on the left. On the right, apparent stricture in the right ureter at the mid sacral level. Abrupt turn with possible kink in the proximal right ureter with double-J stent placed on the right. Electronically Signed   By: Lowella Grip III M.D.   On: 05/07/2017 15:14   US Renal  Result Date: 05/08/2017 CLINICAL DATA:  Hydronephrosis.  History of prostate cancer. EXAM: RENAL / URINARY TRACT ULTRASOUND COMPLETE COMPARISON:  Ultrasound of May 06, 2017. FINDINGS: Right Kidney: Length: 12.9 cm. Echogenicity within normal limits. Right ureteral stent is not well visualized. No mass visualized. Moderate hydronephrosis is noted. Left Kidney: Length: 14.3 cm. Echogenicity within normal limits. No mass visualized. Moderate hydronephrosis is noted. Left ureteral stent is noted. Bladder: Foley catheter is noted in urinary bladder. Bladder appears to be otherwise normal for degree of distension. IMPRESSION: Moderate bilateral hydronephrosis is noted. Left ureteral stent is noted. Right ureteral stent is not well visualized. Electronically Signed   By: Marijo Conception, M.D.   On: 05/08/2017 12:29   Dg C-arm 1-60 Min  Result Date: 05/07/2017 CLINICAL DATA:  Nephrolithiasis EXAM: RETROGRADE PYELOGRAM; DG C-ARM 61-120 MIN COMPARISON:  CT abdomen and pelvis Apr 14, 2017 FLUOROSCOPY TIME:  2 minutes 38 seconds; 7 acquired images FINDINGS: Retrograde filling of both ureters and collecting systems was performed intraoperatively. On the right, there is dilatation of the distal right ureter with an apparent stricture in the ureter at the mid sacral level. There is a rather abrupt band in the proximal right ureter just distal to the right ureteropelvic junction. No calculus is seen on this study on the right. On the left, there is a filling defect in the proximal left ureter consistent with a  calculus. There is also a calculus in a lower pole calyx on the left. There is fullness of the left renal pelvis. Double-J stents were subsequently placed bilaterally. IMPRESSION: Calculi in the proximal left ureter in lower pole left renal calyx with double-J stent subsequently placed on the left. On the right, apparent stricture in the right ureter at the mid sacral level. Abrupt turn with possible kink in the proximal right ureter with double-J stent placed on the right. Electronically Signed   By: Lowella Grip III M.D.   On: 05/07/2017 15:14   Ir Nephrostomy Placement Left  Result Date: 05/09/2017 INDICATION: 83 year old male with acute renal failure and bilateral hydronephrosis. The patient has known left-sided nephrolithiasis, with retrograde pyelograms performed recently (05/07/2017) and bilateral ureteral stent placement. Despite stenting hydronephrosis has worsened with worsening renal failure. He has been referred for therapeutic bilateral percutaneous nephrostomy evaluation. EXAM: IR NEPHROSTOMY PLACEMENT LEFT IR NEPHROSTOMY PLACEMENT RIGHT COMPARISON:  None. MEDICATIONS: Ciprofloxacin 400 mg IV; The antibiotic was administered in an appropriate time frame prior to skin puncture. ANESTHESIA/SEDATION: Fentanyl 100 mcg IV; Versed 2.0 mg IV Moderate Sedation Time:  21 minutes The patient was continuously monitored during the procedure  by the interventional radiology nurse under my direct supervision. CONTRAST:  15 cc - administered into the collecting system(s) FLUOROSCOPY TIME:  Fluoroscopy Time: 1 minutes 18 seconds (6 mGy). COMPLICATIONS: None PROCEDURE: Informed written consent was obtained from the patient after a thorough discussion of the procedural risks, benefits and alternatives. All questions were addressed. Maximal Sterile Barrier Technique was utilized including caps, mask, sterile gowns, sterile gloves, sterile drape, hand hygiene and skin antiseptic. A timeout was performed prior to  the initiation of the procedure. Left: Patient positioned prone position on the fluoroscopy table. Ultrasound survey of the left flank was performed with images stored and sent to PACs. The patient was then prepped and draped in the usual sterile fashion. 1% lidocaine was used to anesthetize the skin and subcutaneous tissues for local anesthesia. A Chiba needle was then used to access a posterior inferior calyx with ultrasound guidance. With spontaneous urine returned through the needle, passage of an 018 micro wire into the collecting system was performed under fluoroscopy. A small incision was made with an 11 blade scalpel, and the needle was removed from the wire. An Accustick system was then advanced over the wire into the collecting system under fluoroscopy. The metal stiffener and inner dilator were removed, and then a sample of fluid was aspirated through the 4 French outer sheath. Bentson wire was passed into the collecting system and the sheath removed. Ten French dilation of the soft tissues was performed. Using modified Seldinger technique, a 10 French pigtail catheter drain was placed over the Bentson wire. Wire and inner stiffener removed, and the pigtail was formed in the collecting system. Small amount of contrast confirmed position of the catheter. Right: Ultrasound survey of the right flank was then performed with images stored and sent to PACs. The patient was then prepped and draped in the usual sterile fashion. 1% lidocaine was used to anesthetize the skin and subcutaneous tissues for local anesthesia. A Chiba needle was then used to access a posterior inferior calyx with ultrasound guidance. With spontaneous urine returned through the needle, passage of an 018 micro wire into the collecting system was performed under fluoroscopy. A small incision was made with an 11 blade scalpel, and the needle was removed from the wire. An Accustick system was then advanced over the wire into the collecting  system under fluoroscopy. The metal stiffener and inner dilator were removed, and then a sample of fluid was aspirated through the 4 French outer sheath. Bentson wire was passed into the collecting system and the sheath removed. Ten French dilation of the soft tissues was performed. Using modified Seldinger technique, a 10 French pigtail catheter drain was placed over the Bentson wire. Wire and inner stiffener removed, and the pigtail was formed in the collecting system. Small amount of contrast confirmed position of the catheter. Patient tolerated the procedure well and remained hemodynamically stable throughout. No complications were encountered and no significant blood loss encountered IMPRESSION: Status post bilateral percutaneous nephrostomy placement. Separate culture was sent for each collecting system. Signed, Dulcy Fanny. Earleen Newport, DO [facility] Radiology Electronically Signed   By: Corrie Mckusick D.O.   On: 05/09/2017 14:35   Ir Nephrostomy Placement Right  Result Date: 05/09/2017 INDICATION: 83 year old male with acute renal failure and bilateral hydronephrosis. The patient has known left-sided nephrolithiasis, with retrograde pyelograms performed recently (05/07/2017) and bilateral ureteral stent placement. Despite stenting hydronephrosis has worsened with worsening renal failure. He has been referred for therapeutic bilateral percutaneous nephrostomy  evaluation. EXAM: IR NEPHROSTOMY PLACEMENT LEFT IR NEPHROSTOMY PLACEMENT RIGHT COMPARISON:  None. MEDICATIONS: Ciprofloxacin 400 mg IV; The antibiotic was administered in an appropriate time frame prior to skin puncture. ANESTHESIA/SEDATION: Fentanyl 100 mcg IV; Versed 2.0 mg IV Moderate Sedation Time:  21 minutes The patient was continuously monitored during the procedure by the interventional radiology nurse under my direct supervision. CONTRAST:  15 cc - administered into the collecting system(s) FLUOROSCOPY  TIME:  Fluoroscopy Time: 1 minutes 18 seconds (6 mGy). COMPLICATIONS: None PROCEDURE: Informed written consent was obtained from the patient after a thorough discussion of the procedural risks, benefits and alternatives. All questions were addressed. Maximal Sterile Barrier Technique was utilized including caps, mask, sterile gowns, sterile gloves, sterile drape, hand hygiene and skin antiseptic. A timeout was performed prior to the initiation of the procedure. Left: Patient positioned prone position on the fluoroscopy table. Ultrasound survey of the left flank was performed with images stored and sent to PACs. The patient was then prepped and draped in the usual sterile fashion. 1% lidocaine was used to anesthetize the skin and subcutaneous tissues for local anesthesia. A Chiba needle was then used to access a posterior inferior calyx with ultrasound guidance. With spontaneous urine returned through the needle, passage of an 018 micro wire into the collecting system was performed under fluoroscopy. A small incision was made with an 11 blade scalpel, and the needle was removed from the wire. An Accustick system was then advanced over the wire into the collecting system under fluoroscopy. The metal stiffener and inner dilator were removed, and then a sample of fluid was aspirated through the 4 French outer sheath. Bentson wire was passed into the collecting system and the sheath removed. Ten French dilation of the soft tissues was performed. Using modified Seldinger technique, a 10 French pigtail catheter drain was placed over the Bentson wire. Wire and inner stiffener removed, and the pigtail was formed in the collecting system. Small amount of contrast confirmed position of the catheter. Right: Ultrasound survey of the right flank was then performed with images stored and sent to PACs. The patient was then prepped and draped in the usual sterile fashion. 1% lidocaine was used to anesthetize the skin and  subcutaneous tissues for local anesthesia. A Chiba needle was then used to access a posterior inferior calyx with ultrasound guidance. With spontaneous urine returned through the needle, passage of an 018 micro wire into the collecting system was performed under fluoroscopy. A small incision was made with an 11 blade scalpel, and the needle was removed from the wire. An Accustick system was then advanced over the wire into the collecting system under fluoroscopy. The metal stiffener and inner dilator were removed, and then a sample of fluid was aspirated through the 4 French outer sheath. Bentson wire was passed into the collecting system and the sheath removed. Ten French dilation of the soft tissues was performed. Using modified Seldinger technique, a 10 French pigtail catheter drain was placed over the Bentson wire. Wire and inner stiffener removed, and the pigtail was formed in the collecting system. Small amount of contrast confirmed position of the catheter. Patient tolerated the procedure well and remained hemodynamically stable throughout. No complications were encountered and no significant blood loss encountered IMPRESSION: Status post bilateral percutaneous nephrostomy placement. Separate culture was sent for each collecting system. Signed, Dulcy Fanny. Earleen Newport, DO [facility] Radiology Electronically Signed   By: Corrie Mckusick D.O.   On: 05/09/2017 14:35  Labs:  CBC:  Recent Labs  05/08/17 0442 05/09/17 0608 05/10/17 0510 05/11/17 0307  WBC 11.9* 13.4* 10.3 13.9*  HGB 11.8* 11.3* 12.6* 12.8*  HCT 35.7* 33.7* 38.1* 39.7  PLT 202 195 199 219    COAGS:  Recent Labs  10/26/16 1033  05/08/17 1252 05/09/17 0608 05/09/17 1038 05/09/17 2235  INR 1.02  --   --   --  1.22  --   APTT 29  < > 77* 67* 34 72*  < > = values in this interval not displayed.  BMP:  Recent Labs  05/09/17 0608 05/09/17 1531 05/10/17 0510 05/11/17 0307  NA  140 141 142 146*  K 5.0 5.2* 4.6 4.6  CL 114* 113* 111 113*  CO2 17* 14* 18* 23  GLUCOSE 169* 188* 133* 149*  BUN 95* 99* 76* 58*  CALCIUM 8.4* 8.7* 8.7* 8.9  CREATININE 4.64* 4.95* 3.23* 2.03*  GFRNONAA 11* 10* 17* 29*  GFRAA 12* 12* 19* 34*    LIVER FUNCTION TESTS:  Recent Labs  10/26/16 1033 05/09/17 1531 05/11/17 0307  BILITOT 1.7*  --   --   AST 26  --   --   ALT 24  --   --   ALKPHOS 69  --   --   PROT 7.2  --   --   ALBUMIN 4.1 2.0* 2.1*    Assessment and Plan: ARF, bilat hydronephrosis, failed stents, prostate ca; s/p bilat PCN's 6/12; AF; WBC 13.9, creat 2.03(3.23); cont PCN's -further plans as directed by urology   Electronically Signed: D. Rowe Robert, PA-C 05/11/2017, 11:33 AM   I spent a total of 15 minutes at the the patient's bedside AND on the patient's hospital floor or unit, greater than 50% of which was counseling/coordinating care for bilateral nephrostomies    Patient ID: William [name], male   DOB: 1934-11-25, 83 y.o.   MRN: [id number]

## 2017-05-11 NOTE — Progress Notes (Signed)
PROGRESS NOTE    William [name]  NUU:[id number] DOB: 09-15-1934 DOA: 05/06/2017 PCP: Jani Gravel, MD     Brief Narrative:  William Bhat [name] an 83 y.o.malewith a history of AFib on eliquis, CVA in Nov 2017, HTN, IDDM, prostate CA, andOA s/p bilateral TKA who was brought to the ED by his family on 6/9 due to progressive generalized weakness, worsening in the past few weeks, leading to recurrent falls. Workup revealed acute kidney injury, leukocytosis with evidence of UTI, bilateral hydronephrosis on renal ultrasound. Urology was consulted and patient underwent ureteral stent placement on 6/10. Due to lack of improvement in creatinine function, ultrasound of kidneys was repeated which showed ureteral stent failure. Patient ultimately underwent bilateral nephrostomy tube placement by IR on 6/12.  Assessment & Plan:   Principal Problem:   Acute lower UTI Active Problems:   IDDM (insulin dependent diabetes mellitus) ([place])   Prostate cancer (HCC)   Sleep apnea   Acute renal failure (ARF) (HCC)   Occult GI bleeding   History of prostate cancer   Hydronephrosis, bilateral   AKI (acute kidney injury) ([place])   Diabetes mellitus type 2 in nonobese (HCC)   Atrial fibrillation (HCC)   Benign essential HTN   Leukocytosis   Acute blood loss anemia   Hydronephrosis   Protein-calorie malnutrition, severe  Acute renal failure with bilateral hydronephrosis and UTI: Suspect chronic element to kidney disease with HTN, DM, but acutely worsened due to postrenal obstruction. FENa 0.68%. No uremic symptoms at this time. - Ureteral stents placed 6/10. Op note states bladder was edematous, neovascularized, bleeding easily with extremely limited capacity (~30cc), explaining nocturia/frequency and hematuria.  - Repeat renal US 6/11 with persistent moderate bilateral hydronephrosis, right ureteral stent not well visualized  - Bilateral nephrostomy tubes placed 6/12, with urine output - Kidney function with  improvement today  - Nephro and urology following   Coag negative staph UTI - Blood cultures negative to date  - Continue doxycycline   Weakness, falls: No syncope. Suspect due subacutely to deconditioning post-CVA, with low grade uremia contributing and acutely worsened by infection. TSH wnl. - PT consult, recommending CIR eval   Normocytic anemia - Iron studies, folate, vit B12 ordered   History of prostate CA s/p IMRT Sep 2012: With increased LUTS, UTI, unintentional weight loss and reported painless hematuria.  - Repeat PSA 0.05   Occult GI bleeding: +FOBT in the setting of NOAC + renal failure. Fortunately no anemia on admission. With h/o radiation proctitis, last colonoscopy by [facility] reportedly negative, no f/u recommended at that time.  - Discussed risks/benefits of holding anticoagulation in this setting, will give anticoag for now  - No indication for urgent work up. Will follow up with Dr. Ardis Hughs  IDT2DM: HbA1c 6.8% in 2017, controlled for age. - Hold levemir for now with poor po - SSI, titrate long-acting as needed  Paroxysmal atrial flutter - Currently NSR  - Eliquis   Essential HTN - Holding lisinopril and thiazide diuretic due to renal failure - Start norvasc   Insomnia/sundowning - Ativan PO prn, trazodone PO qhs   M-spike: Reported - Hematology referral to Dr. Beryle Beams as outpatient in progress  OSA: - Does not tolerate PAP qHS  Severe malnutrition in context of chronic illness - Per dietitian   DVT prophylaxis: Eliquis  Code Status: Full Family Communication: Family at bedside Disposition Plan: Pending CIR    Consultants:   Urology Dr. Junious Silk  Nephrology Dr. Archie Balboa  IR   Procedures:  Cystoscopy and ureteral stents placement 05/07/2017 by Dr. Junious Silk  Bilateral nephrostomy tube placement 05/09/2017 by Dr. Earleen Newport   Antimicrobials:  Anti-infectives    Start     Dose/Rate Route Frequency Ordered Stop   05/09/17 1345   ciprofloxacin (CIPRO) IVPB 400 mg    Comments:  Pharmacist called re: this drug and creatinine. Drug approved by pharmacist for one time dose. Dr. Earleen Newport aware.   400 mg 200 mL/hr over 60 Minutes Intravenous To Radiology 05/09/17 1328 05/09/17 1448   05/09/17 1318  ciprofloxacin (CIPRO) 400 MG/200ML IVPB    Comments:  [place], Jamie   : cabinet override      05/09/17 1318 05/09/17 1348   05/08/17 1500  doxycycline (VIBRA-TABS) tablet 100 mg     100 mg Oral Every 12 hours 05/08/17 1427     05/06/17 2200  cefTRIAXone (ROCEPHIN) 1 g in dextrose 5 % 50 mL IVPB     1 g 100 mL/hr over 30 Minutes Intravenous Every 24 hours 05/06/17 2029 05/07/17 2208         Subjective: Patient had a rough night, agitated, sleeping on and off    Objective: Vitals:   05/10/17 2033 05/11/17 0229 05/11/17 0522 05/11/17 0911  BP: (!) 159/88  (!) 183/94 (!) 172/97  Pulse: 78  95 84  Resp: 16  16 17   Temp: 97.6 F (36.4 C)  97.4 F (36.3 C) 97.7 F (36.5 C)  TempSrc: Oral  Oral Oral  SpO2: 100%  95% 97%  Weight:  82.3 kg (181 lb 8 oz)    Height:        Intake/Output Summary (Last 24 hours) at 05/11/17 1059 Last data filed at 05/11/17 0900  Gross per 24 hour  Intake           784.53 ml  Output             2830 ml  Net         -2045.47 ml   Filed Weights   05/09/17 1002 05/09/17 2100 05/11/17 0229  Weight: 94.1 kg (207 lb 6.4 oz) 96.7 kg (213 lb 3 oz) 82.3 kg (181 lb 8 oz)    Examination:  General exam: Appears calm and comfortable  Respiratory system: Clear to auscultation. Respiratory effort normal. Cardiovascular system: S1 & S2 heard, RRR. No JVD, murmurs, rubs, gallops or clicks. No pedal edema. Gastrointestinal system: Abdomen is nondistended, soft and nontender. No organomegaly or masses felt. Normal bowel sounds heard. Central nervous system: Nonfocal  GU: bilateral nephrostomy tubes draining urine Extremities: Symmetric 5 x 5 power. Skin: No rashes, lesions or ulcers  Data  Reviewed: I have personally reviewed following labs and imaging studies  CBC:  Recent Labs Lab 05/07/17 0403 05/08/17 0442 05/09/17 0608 05/10/17 0510 05/11/17 0307  WBC 12.6* 11.9* 13.4* 10.3 13.9*  HGB 11.7* 11.8* 11.3* 12.6* 12.8*  HCT 34.5* 35.7* 33.7* 38.1* 39.7  MCV 89.4 90.6 90.1 91.8 91.9  PLT 217 202 195 199 417   Basic Metabolic Panel:  Recent Labs Lab 05/08/17 0442 05/09/17 0608 05/09/17 1531 05/10/17 0510 05/11/17 0307  NA 139 140 141 142 146*  K 4.8 5.0 5.2* 4.6 4.6  CL 112* 114* 113* 111 113*  CO2 18* 17* 14* 18* 23  GLUCOSE 163* 169* 188* 133* 149*  BUN 86* 95* 99* 76* 58*  CREATININE 3.83* 4.64* 4.95* 3.23* 2.03*  CALCIUM 8.5* 8.4* 8.7* 8.7* 8.9  PHOS  --   --  5.6*  --  3.1  GFR: Estimated Creatinine Clearance: 32.8 mL/min (A) (by C-G formula based on SCr of 2.03 mg/dL (H)). Liver Function Tests:  Recent Labs Lab 05/09/17 1531 05/11/17 0307  ALBUMIN 2.0* 2.1*   No results for input(s): LIPASE, AMYLASE in the last 168 hours. No results for input(s): AMMONIA in the last 168 hours. Coagulation Profile:  Recent Labs Lab 05/09/17 1038  INR 1.22   Cardiac Enzymes: No results for input(s): CKTOTAL, CKMB, CKMBINDEX, TROPONINI in the last 168 hours. BNP (last 3 results) No results for input(s): PROBNP in the last 8760 hours. HbA1C: No results for input(s): HGBA1C in the last 72 hours. CBG:  Recent Labs Lab 05/10/17 0827 05/10/17 1216 05/10/17 1655 05/10/17 2026 05/11/17 0733  GLUCAP 132* 175* 190* 170* 158*   Lipid Profile: No results for input(s): CHOL, HDL, LDLCALC, TRIG, CHOLHDL, LDLDIRECT in the last 72 hours. Thyroid Function Tests: No results for input(s): TSH, T4TOTAL, FREET4, T3FREE, THYROIDAB in the last 72 hours. Anemia Panel: No results for input(s): VITAMINB12, FOLATE, FERRITIN, TIBC, IRON, RETICCTPCT in the last 72 hours. Sepsis Labs: No results for input(s): PROCALCITON, LATICACIDVEN in the last 168 hours.  Recent  Results (from the past 240 hour(s))  Urine Culture     Status: Abnormal   Collection Time: 05/06/17  4:43 PM  Result Value Ref Range Status   Specimen Description URINE, RANDOM  Final   Special Requests NONE  Final   Culture (A)  Final    50,000 COLONIES/mL STAPHYLOCOCCUS SPECIES (COAGULASE NEGATIVE)   Report Status 05/08/2017 FINAL  Final   Organism ID, Bacteria STAPHYLOCOCCUS SPECIES (COAGULASE NEGATIVE) (A)  Final      Susceptibility   Staphylococcus species (coagulase negative) - MIC*    CIPROFLOXACIN >=8 RESISTANT Resistant     GENTAMICIN <=0.5 SENSITIVE Sensitive     NITROFURANTOIN <=16 SENSITIVE Sensitive     OXACILLIN >=4 RESISTANT Resistant     TETRACYCLINE <=1 SENSITIVE Sensitive     VANCOMYCIN <=0.5 SENSITIVE Sensitive     TRIMETH/SULFA <=10 SENSITIVE Sensitive     CLINDAMYCIN >=8 RESISTANT Resistant     RIFAMPIN <=0.5 SENSITIVE Sensitive     Inducible Clindamycin NEGATIVE Sensitive     * 50,000 COLONIES/mL STAPHYLOCOCCUS SPECIES (COAGULASE NEGATIVE)  Culture, blood (routine x 2)     Status: None (Preliminary result)   Collection Time: 05/06/17  4:53 PM  Result Value Ref Range Status   Specimen Description BLOOD RIGHT FOREARM  Final   Special Requests   Final    BOTTLES DRAWN AEROBIC AND ANAEROBIC Blood Culture adequate volume   Culture NO GROWTH 4 DAYS  Final   Report Status PENDING  Incomplete  Culture, blood (routine x 2)     Status: None (Preliminary result)   Collection Time: 05/06/17  5:10 PM  Result Value Ref Range Status   Specimen Description BLOOD LEFT ANTECUBITAL  Final   Special Requests   Final    BOTTLES DRAWN AEROBIC AND ANAEROBIC Blood Culture adequate volume   Culture NO GROWTH 4 DAYS  Final   Report Status PENDING  Incomplete  Surgical pcr screen     Status: None   Collection Time: 05/07/17 10:31 AM  Result Value Ref Range Status   MRSA, PCR NEGATIVE NEGATIVE Final   Staphylococcus aureus NEGATIVE NEGATIVE Final    Comment:        The Xpert SA  Assay (FDA approved for NASAL specimens in patients over 75 years of age), is one component of a comprehensive surveillance program.  Test performance has been validated by [facility] for patients greater than or equal to 64 year old. It is not intended to diagnose infection nor to guide or monitor treatment.   Culture, Urine     Status: None   Collection Time: 05/09/17  2:16 PM  Result Value Ref Range Status   Specimen Description KIDNEY LEFT  Final   Special Requests NONE  Final   Culture NO GROWTH  Final   Report Status 05/10/2017 FINAL  Final  Culture, Urine     Status: None   Collection Time: 05/09/17  2:16 PM  Result Value Ref Range Status   Specimen Description KIDNEY RIGHT  Final   Special Requests NONE  Final   Culture NO GROWTH  Final   Report Status 05/10/2017 FINAL  Final       Radiology Studies: Dg Abd 1 View  Result Date: 05/09/2017 CLINICAL DATA:  Evaluate stent placement. EXAM: ABDOMEN - 1 VIEW COMPARISON:  CT, 04/14/2017 FINDINGS: There are bilateral ureteral stents, which appear well-positioned, and are stable when compared to the retrograde ureteral pyelogram dated 05/07/2017. Two stones project in the lower pole the left kidney, stable from the prior CT. No other evidence of renal stones. No evidence of a ureteral stone. Normal bowel gas pattern. No acute skeletal abnormality. IMPRESSION: Stable bilateral ureteral stents when compared to the previous retrograde new ureteral pyelogram. 2 nonobstructing stones in the lower pole the left kidney, also stable. Electronically Signed   By: Lajean Manes M.D.   On: 05/09/2017 12:53   Ir Nephrostomy Placement Left  Result Date: 05/09/2017 INDICATION: 83 year old male with acute renal failure and bilateral hydronephrosis. The patient has known left-sided nephrolithiasis, with retrograde pyelograms performed recently (05/07/2017) and bilateral ureteral stent placement. Despite stenting hydronephrosis has worsened with  worsening renal failure. He has been referred for therapeutic bilateral percutaneous nephrostomy evaluation. EXAM: IR NEPHROSTOMY PLACEMENT LEFT IR NEPHROSTOMY PLACEMENT RIGHT COMPARISON:  None. MEDICATIONS: Ciprofloxacin 400 mg IV; The antibiotic was administered in an appropriate time frame prior to skin puncture. ANESTHESIA/SEDATION: Fentanyl 100 mcg IV; Versed 2.0 mg IV Moderate Sedation Time:  21 minutes The patient was continuously monitored during the procedure by the interventional radiology nurse under my direct supervision. CONTRAST:  15 cc - administered into the collecting system(s) FLUOROSCOPY TIME:  Fluoroscopy Time: 1 minutes 18 seconds (6 mGy). COMPLICATIONS: None PROCEDURE: Informed written consent was obtained from the patient after a thorough discussion of the procedural risks, benefits and alternatives. All questions were addressed. Maximal Sterile Barrier Technique was utilized including caps, mask, sterile gowns, sterile gloves, sterile drape, hand hygiene and skin antiseptic. A timeout was performed prior to the initiation of the procedure. Left: Patient positioned prone position on the fluoroscopy table. Ultrasound survey of the left flank was performed with images stored and sent to PACs. The patient was then prepped and draped in the usual sterile fashion. 1% lidocaine was used to anesthetize the skin and subcutaneous tissues for local anesthesia. A Chiba needle was then used to access a posterior inferior calyx with ultrasound guidance. With spontaneous urine returned through the needle, passage of an 018 micro wire into the collecting system was performed under fluoroscopy. A small incision was made with an 11 blade scalpel, and the needle was removed from the wire. An Accustick system was then advanced over the wire into the collecting system under fluoroscopy. The metal stiffener and inner dilator were removed, and then a sample of fluid was aspirated through the 4  French outer sheath.  Bentson wire was passed into the collecting system and the sheath removed. Ten French dilation of the soft tissues was performed. Using modified Seldinger technique, a 10 French pigtail catheter drain was placed over the Bentson wire. Wire and inner stiffener removed, and the pigtail was formed in the collecting system. Small amount of contrast confirmed position of the catheter. Right: Ultrasound survey of the right flank was then performed with images stored and sent to PACs. The patient was then prepped and draped in the usual sterile fashion. 1% lidocaine was used to anesthetize the skin and subcutaneous tissues for local anesthesia. A Chiba needle was then used to access a posterior inferior calyx with ultrasound guidance. With spontaneous urine returned through the needle, passage of an 018 micro wire into the collecting system was performed under fluoroscopy. A small incision was made with an 11 blade scalpel, and the needle was removed from the wire. An Accustick system was then advanced over the wire into the collecting system under fluoroscopy. The metal stiffener and inner dilator were removed, and then a sample of fluid was aspirated through the 4 French outer sheath. Bentson wire was passed into the collecting system and the sheath removed. Ten French dilation of the soft tissues was performed. Using modified Seldinger technique, a 10 French pigtail catheter drain was placed over the Bentson wire. Wire and inner stiffener removed, and the pigtail was formed in the collecting system. Small amount of contrast confirmed position of the catheter. Patient tolerated the procedure well and remained hemodynamically stable throughout. No complications were encountered and no significant blood loss encountered IMPRESSION: Status post bilateral percutaneous nephrostomy placement. Separate culture was sent for each collecting system. Signed, Dulcy Fanny. Earleen Newport, DO [facility]  [facility] Radiology Electronically Signed   By: Corrie Mckusick D.O.   On: 05/09/2017 14:35   Ir Nephrostomy Placement Right  Result Date: 05/09/2017 INDICATION: 83 year old male with acute renal failure and bilateral hydronephrosis. The patient has known left-sided nephrolithiasis, with retrograde pyelograms performed recently (05/07/2017) and bilateral ureteral stent placement. Despite stenting hydronephrosis has worsened with worsening renal failure. He has been referred for therapeutic bilateral percutaneous nephrostomy evaluation. EXAM: IR NEPHROSTOMY PLACEMENT LEFT IR NEPHROSTOMY PLACEMENT RIGHT COMPARISON:  None. MEDICATIONS: Ciprofloxacin 400 mg IV; The antibiotic was administered in an appropriate time frame prior to skin puncture. ANESTHESIA/SEDATION: Fentanyl 100 mcg IV; Versed 2.0 mg IV Moderate Sedation Time:  21 minutes The patient was continuously monitored during the procedure by the interventional radiology nurse under my direct supervision. CONTRAST:  15 cc - administered into the collecting system(s) FLUOROSCOPY TIME:  Fluoroscopy Time: 1 minutes 18 seconds (6 mGy). COMPLICATIONS: None PROCEDURE: Informed written consent was obtained from the patient after a thorough discussion of the procedural risks, benefits and alternatives. All questions were addressed. Maximal Sterile Barrier Technique was utilized including caps, mask, sterile gowns, sterile gloves, sterile drape, hand hygiene and skin antiseptic. A timeout was performed prior to the initiation of the procedure. Left: Patient positioned prone position on the fluoroscopy table. Ultrasound survey of the left flank was performed with images stored and sent to PACs. The patient was then prepped and draped in the usual sterile fashion. 1% lidocaine was used to anesthetize the skin and subcutaneous tissues for local anesthesia. A Chiba needle was then used to access a posterior inferior calyx with ultrasound guidance. With spontaneous urine  returned through the needle, passage of an 018 micro wire into the collecting system was  performed under fluoroscopy. A small incision was made with an 11 blade scalpel, and the needle was removed from the wire. An Accustick system was then advanced over the wire into the collecting system under fluoroscopy. The metal stiffener and inner dilator were removed, and then a sample of fluid was aspirated through the 4 French outer sheath. Bentson wire was passed into the collecting system and the sheath removed. Ten French dilation of the soft tissues was performed. Using modified Seldinger technique, a 10 French pigtail catheter drain was placed over the Bentson wire. Wire and inner stiffener removed, and the pigtail was formed in the collecting system. Small amount of contrast confirmed position of the catheter. Right: Ultrasound survey of the right flank was then performed with images stored and sent to PACs. The patient was then prepped and draped in the usual sterile fashion. 1% lidocaine was used to anesthetize the skin and subcutaneous tissues for local anesthesia. A Chiba needle was then used to access a posterior inferior calyx with ultrasound guidance. With spontaneous urine returned through the needle, passage of an 018 micro wire into the collecting system was performed under fluoroscopy. A small incision was made with an 11 blade scalpel, and the needle was removed from the wire. An Accustick system was then advanced over the wire into the collecting system under fluoroscopy. The metal stiffener and inner dilator were removed, and then a sample of fluid was aspirated through the 4 French outer sheath. Bentson wire was passed into the collecting system and the sheath removed. Ten French dilation of the soft tissues was performed. Using modified Seldinger technique, a 10 French pigtail catheter drain was placed over the Bentson wire. Wire and inner stiffener removed, and the pigtail was formed in the  collecting system. Small amount of contrast confirmed position of the catheter. Patient tolerated the procedure well and remained hemodynamically stable throughout. No complications were encountered and no significant blood loss encountered IMPRESSION: Status post bilateral percutaneous nephrostomy placement. Separate culture was sent for each collecting system. Signed, Dulcy Fanny. Earleen Newport, DO [facility] Radiology Electronically Signed   By: Corrie Mckusick D.O.   On: 05/09/2017 14:35      Scheduled Meds: . amLODipine  5 mg Oral QHS  . apixaban  2.5 mg Oral BID  . doxycycline  100 mg Oral Q12H  . feeding supplement (ENSURE ENLIVE)  237 mL Oral BID BM  . insulin aspart  0-9 Units Subcutaneous TID WC  . mouth rinse  15 mL Mouth Rinse BID  . multivitamin  1 tablet Oral QHS  . pravastatin  40 mg Oral Q supper  . sodium bicarbonate  1,300 mg Oral TID  . sodium chloride flush  3 mL Intravenous Q12H  . traZODone  50 mg Oral QHS   Continuous Infusions:    LOS: 5 days    Time spent: 30 minutes   Dessa Phi, DO [facility] [email or web address] Password [facility] 05/11/2017, 10:59 AM

## 2017-05-12 LAB — URINALYSIS, ROUTINE W REFLEX MICROSCOPIC
BILIRUBIN URINE: NEGATIVE
GLUCOSE, UA: 50 mg/dL — AB
GLUCOSE, UA: NEGATIVE mg/dL
KETONES UR: 15 mg/dL — AB
KETONES UR: NEGATIVE mg/dL
NITRITE: NEGATIVE
Nitrite: POSITIVE — AB
PH: 6 (ref 5.0–8.0)
PH: 6.5 (ref 5.0–8.0)
Protein, ur: >300 mg/dL — AB
Protein, ur: 100 mg/dL — AB
SPECIFIC GRAVITY, URINE: 1.011 (ref 1.005–1.030)
SPECIFIC GRAVITY, URINE: 1.02 (ref 1.005–1.030)

## 2017-05-12 LAB — RENAL FUNCTION PANEL
ANION GAP: 10 (ref 5–15)
Albumin: 2.1 g/dL — ABNORMAL LOW (ref 3.5–5.0)
BUN: 43 mg/dL — ABNORMAL HIGH (ref 6–20)
CO2: 25 mmol/L (ref 22–32)
CREATININE: 1.57 mg/dL — AB (ref 0.61–1.24)
Calcium: 8.7 mg/dL — ABNORMAL LOW (ref 8.9–10.3)
Chloride: 112 mmol/L — ABNORMAL HIGH (ref 101–111)
GFR, EST AFRICAN AMERICAN: 46 mL/min — AB (ref >60)
GFR, EST NON AFRICAN AMERICAN: 40 mL/min — AB (ref >60)
Glucose, Bld: 166 mg/dL — ABNORMAL HIGH (ref 65–99)
POTASSIUM: 4.3 mmol/L (ref 3.5–5.1)
Phosphorus: 3 mg/dL (ref 2.5–4.6)
Sodium: 147 mmol/L — ABNORMAL HIGH (ref 135–145)

## 2017-05-12 LAB — URINALYSIS, MICROSCOPIC (REFLEX)

## 2017-05-12 LAB — CBC
HEMATOCRIT: 36.9 % — AB (ref 39.0–52.0)
HEMOGLOBIN: 11.9 g/dL — AB (ref 13.0–17.0)
MCH: 30.1 pg (ref 26.0–34.0)
MCHC: 32.2 g/dL (ref 30.0–36.0)
MCV: 93.2 fL (ref 78.0–100.0)
Platelets: 209 10*3/uL (ref 150–400)
RBC: 3.96 MIL/uL — AB (ref 4.22–5.81)
RDW: 14.5 % (ref 11.5–15.5)
WBC: 16.1 10*3/uL — AB (ref 4.0–10.5)

## 2017-05-12 LAB — GLUCOSE, CAPILLARY
GLUCOSE-CAPILLARY: 158 mg/dL — AB (ref 65–99)
Glucose-Capillary: 194 mg/dL — ABNORMAL HIGH (ref 65–99)
Glucose-Capillary: 242 mg/dL — ABNORMAL HIGH (ref 65–99)
Glucose-Capillary: 200 mg/dL — ABNORMAL HIGH (ref 65–99)

## 2017-05-12 LAB — FOLATE RBC
Folate, Hemolysate: 481.7 ng/mL
Folate, RBC: 1219 ng/mL (ref >498)
Hematocrit: 39.5 % (ref 37.5–51.0)

## 2017-05-12 MED ORDER — APIXABAN 5 MG PO TABS
5.0000 mg | ORAL_TABLET | Freq: Two times a day (BID) | ORAL | Status: DC
  Administered 2017-05-12 – 2017-05-13 (×3): 5 mg via ORAL
  Filled 2017-05-12 (×3): qty 1

## 2017-05-12 NOTE — Progress Notes (Addendum)
Rehab admissions - I spoke with attending MD who tells me that patient cannot admit to acute inpatient rehab today.  Noted rise in Na+ level over the past few days.  Call me for questions.  #[phone]  I spoke with rehab MD and I can potentially admit to acute inpatient rehab tomorrow, Saturday.  I could also admit Sunday if needed from a medical standpoint.  I will plan for admit Saturday.  Call me for questions.  #[phone]

## 2017-05-12 NOTE — Discharge Instructions (Signed)
Ureteral Stent Implantation, Care After Refer to this sheet in the next few weeks. These instructions provide you with information about caring for yourself after your procedure. Your health care provider may also give you more specific instructions. Your treatment has been planned according to current medical practices, but problems sometimes occur. Call your health care provider if you have any problems or questions after your procedure. What can I expect after the procedure? After the procedure, it is common to have:  Nausea.  Mild pain when you urinate. You may feel this pain in your lower back or lower abdomen. Pain should stop within a few minutes after you urinate. This may last for up to 1 week.  A small amount of blood in your urine for several days.  Follow these instructions at home:  Medicines  Take over-the-counter and prescription medicines only as told by your health care provider.  If you were prescribed an antibiotic medicine, take it as told by your health care provider. Do not stop taking the antibiotic even if you start to feel better.  Do not drive for 24 hours if you received a sedative.  Do not drive or operate heavy machinery while taking prescription pain medicines. Activity  Return to your normal activities as told by your health care provider. Ask your health care provider what activities are safe for you.  Do not lift anything that is heavier than 10 lb (4.5 kg). Follow this limit for 1 week after your procedure, or for as long as told by your health care provider. General instructions  Watch for any blood in your urine. Call your health care provider if the amount of blood in your urine increases.  If you have a catheter: ? Follow instructions from your health care provider about taking care of your catheter and collection bag. ? Do not take baths, swim, or use a hot tub until your health care provider approves.  Drink enough fluid to keep your urine  clear or pale yellow.  Keep all follow-up visits as told by your health care provider. This is important. Contact a health care provider if:  You have pain that gets worse or does not get better with medicine, especially pain when you urinate.  You have difficulty urinating.  You feel nauseous or you vomit repeatedly during a period of more than 2 days after the procedure. Get help right away if:  Your urine is dark red or has blood clots in it.  You are leaking urine (have incontinence).  The end of the stent comes out of your urethra.  You cannot urinate.  You have sudden, sharp, or severe pain in your abdomen or lower back.  You have a fever. This information is not intended to replace advice given to you by your health care provider. Make sure you discuss any questions you have with your health care provider. Document Released: 07/17/2013 Document Revised: 04/21/2016 Document Reviewed: 05/29/2015 Elsevier Interactive Patient Education  2018 [place] on my medicine - ELIQUIS (apixaban)  Why was Eliquis prescribed for you? Eliquis was prescribed for you to reduce the risk of a blood clot forming that can cause a stroke if you have a medical condition called atrial fibrillation (a type of irregular heartbeat).  What do You need to know about Eliquis ? Take your Eliquis TWICE DAILY - one tablet in the morning and one tablet in the evening with or without food. If you have difficulty swallowing the tablet whole please  discuss with your pharmacist how to take the medication safely.  Take Eliquis exactly as prescribed by your doctor and DO NOT stop taking Eliquis without talking to the doctor who prescribed the medication.  Stopping may increase your risk of developing a stroke.  Refill your prescription before you run out.  After discharge, you should have regular check-up appointments with your healthcare provider that is prescribing your Eliquis.  In  the future your dose may need to be changed if your kidney function or weight changes by a significant amount or as you get older.  What do you do if you miss a dose? If you miss a dose, take it as soon as you remember on the same day and resume taking twice daily.  Do not take more than one dose of ELIQUIS at the same time to make up a missed dose.  Important Safety Information A possible side effect of Eliquis is bleeding. You should call your healthcare provider right away if you experience any of the following: ? Bleeding from an injury or your nose that does not stop. ? Unusual colored urine (red or dark brown) or unusual colored stools (red or black). ? Unusual bruising for unknown reasons. ? A serious fall or if you hit your head (even if there is no bleeding).  Some medicines may interact with Eliquis and might increase your risk of bleeding or clotting while on Eliquis. To help avoid this, consult your healthcare provider or pharmacist prior to using any new prescription or non-prescription medications, including herbals, vitamins, non-steroidal anti-inflammatory drugs (NSAIDs) and supplements.  This website has more information on Eliquis (apixaban): http://[email or web address].com/eliquis/home

## 2017-05-12 NOTE — Progress Notes (Signed)
Physical Therapy Treatment Patient Details Name: William [name] MRN: [id number] DOB: 12-23-1933 Today's Date: 05/12/2017    History of Present Illness [name] is a 83 y.o. male with a history of AFib on eliquis, CVA in Nov 2017, HTN, IDDM, prostate CA,and  OA s/p bilateral TKA who was brought to the ED by his family due to progressive generalized weakness leading to recurrent falls. Complex UTI, bil hydronephrosis, urethral stent trial failed, now with bilateral nephrostomy drains    PT Comments    Pt is able to perform bed mobs with a slight decrease in assistance required this session. Improved transfers, but still reaches for RW to stand despite cues for hand placement. Pt will require continued instruction on safety with transfers. Pt still requires at least Min A for mobility due to equipment management and with how quickly he fatigues.     Follow Up Recommendations  CIR     Equipment Recommendations  Rolling walker with 5" wheels    Recommendations for Other Services OT consult     Precautions / Restrictions Precautions Precautions: Fall Restrictions Weight Bearing Restrictions: No    Mobility  Bed Mobility Overal bed mobility: Needs Assistance Bed Mobility: Supine to Sit     Supine to sit: Min assist     General bed mobility comments: Min A to sit upright at EOB  Transfers Overall transfer level: Needs assistance Equipment used: Rolling walker (2 wheeled) Transfers: Sit to/from Stand Sit to Stand: Min assist         General transfer comment: Min A to power up from edge of bed, cues for hand placement. Pt still reaches for RW to stand  Ambulation/Gait Ambulation/Gait assistance: Min assist Ambulation Distance (Feet): 50 Feet Assistive device: Rolling walker (2 wheeled) Gait Pattern/deviations: Step-through pattern Gait velocity: decreased Gait velocity interpretation: Below normal speed for age/gender General Gait Details: cues for proximity to  RW, and upright posture as he fatigues.   Stairs            Wheelchair Mobility    Modified Rankin (Stroke Patients Only)       Balance Overall balance assessment: Needs assistance Sitting-balance support: No upper extremity supported;Feet supported Sitting balance-Leahy Scale: Fair     Standing balance support: Bilateral upper extremity supported Standing balance-Leahy Scale: Poor Standing balance comment: reliant on RW for stability in standing                            Cognition Arousal/Alertness: Awake/alert Behavior During Therapy: WFL for tasks assessed/performed Overall Cognitive Status: Within Functional Limits for tasks assessed                                        Exercises      General Comments        Pertinent Vitals/Pain Pain Assessment: No/denies pain    Home Living                      Prior Function            PT Goals (current goals can now be found in the care plan section) Acute Rehab PT Goals Patient Stated Goal: back to normal Progress towards PT goals: Progressing toward goals    Frequency    Min 3X/week      PT Plan Current plan remains  appropriate    Co-evaluation              AM-PAC PT "6 Clicks" Daily Activity  Outcome Measure  Difficulty turning over in bed (including adjusting bedclothes, sheets and blankets)?: Total Difficulty moving from lying on back to sitting on the side of the bed? : Total Difficulty sitting down on and standing up from a chair with arms (e.g., wheelchair, bedside commode, etc,.)?: Total Help needed moving to and from a bed to chair (including a wheelchair)?: A Little Help needed walking in hospital room?: A Little Help needed climbing 3-5 steps with a railing? : A Little 6 Click Score: 12    End of Session Equipment Utilized During Treatment: Gait belt Activity Tolerance: Patient tolerated treatment well Patient left: in chair;with call  bell/phone within reach;with family/visitor present Nurse Communication: Mobility status PT Visit Diagnosis: Unsteadiness on feet (R26.81);Other abnormalities of gait and mobility (R26.89);Muscle weakness (generalized) (M62.81)     Time: 6606-3016 PT Time Calculation (min) (ACUTE ONLY): 30 min  Charges:  $Gait Training: 8-22 mins $Therapeutic Activity: 8-22 mins                    G Codes:       [name] PT, DPT  [phone]    [name] Sloan Leiter 05/12/2017, 9:38 AM

## 2017-05-12 NOTE — Care Management Important Message (Signed)
Important Message  Patient Details  Name: William [name] MRN: [id number] Date of Birth: 10-12-1934   Medicare Important Message Given:  Yes    Orbie Pyo 05/12/2017, 1:26 PM

## 2017-05-12 NOTE — Progress Notes (Signed)
Subjective: Interval History: has no complaint, now getting up. Family says improved MS.  Objective: Vital signs in last 24 hours: Temp:  [97.6 F (36.4 C)-97.9 F (36.6 C)] 97.7 F (36.5 C) (06/15 0454) Pulse Rate:  [75-84] 79 (06/15 0454) Resp:  [18] 18 (06/15 0454) BP: (135-175)/(69-90) 135/69 (06/15 0454) SpO2:  [97 %-98 %] 98 % (06/15 0454) Weight:  [94.3 kg (208 lb)] 94.3 kg (208 lb) (06/14 2202) Weight change: 12 kg (26 lb 8 oz)  Intake/Output from previous day: 06/14 0701 - 06/15 0700 In: 620 [P.O.:600] Out: 1675 [Drains:1675] Intake/Output this shift: No intake/output data recorded.  General appearance: alert, cooperative, no distress and pale Resp: clear to auscultation bilaterally Cardio: irregularly irregular rhythm and systolic murmur: systolic ejection 2/6, decrescendo at 2nd left intercostal space GI: soft, pos bs, nontender Extremities: extremities normal, atraumatic, no cyanosis or edema  Lab Results:  Recent Labs  05/11/17 0307 05/12/17 0421  WBC 13.9* 16.1*  HGB 12.8* 11.9*  HCT 39.7 36.9*  PLT 219 209   BMET:  Recent Labs  05/11/17 0307 05/12/17 0421  NA 146* 147*  K 4.6 4.3  CL 113* 112*  CO2 23 25  GLUCOSE 149* 166*  BUN 58* 43*  CREATININE 2.03* 1.57*  CALCIUM 8.9 8.7*   No results for input(s): PTH in the last 72 hours. Iron Studies:  Recent Labs  05/11/17 0959  IRON 77  TIBC 154*  FERRITIN 581*    Studies/Results: No results found.  I have reviewed the patient's current medications.  Assessment/Plan: 1 AKI improving, diuresis post obstructive.  Needs to push po fluids.  SNa rising with DI like state postobstructive. 2 obstructive uropathy  PCNx doing well 3 anemia 4 DM controlled 5 Afib rate control anticoag P PCNx, follow chem push pos    LOS: 6 days   Jeniece Hannis L 05/12/2017,9:16 AM

## 2017-05-12 NOTE — Progress Notes (Signed)
PROGRESS NOTE    William [name]  NLG:[id number] DOB: 1934-08-24 DOA: 05/06/2017 PCP: Jani Gravel, MD     Brief Narrative:  William Avey [name] an 83 y.o.malewith a history of AFib on eliquis, CVA in Nov 2017, HTN, IDDM, prostate CA, andOA s/p bilateral TKA who was brought to the ED by his family on 6/9 due to progressive generalized weakness, worsening in the past few weeks, leading to recurrent falls. Workup revealed acute kidney injury, leukocytosis with evidence of UTI, bilateral hydronephrosis on renal ultrasound. Urology was consulted and patient underwent ureteral stent placement on 6/10. Due to lack of improvement in creatinine function, ultrasound of kidneys was repeated which showed ureteral stent failure. Patient ultimately underwent bilateral nephrostomy tube placement by IR on 6/12.  Assessment & Plan:   Principal Problem:   Acute lower UTI Active Problems:   IDDM (insulin dependent diabetes mellitus) ([place])   Prostate cancer (HCC)   Sleep apnea   Acute renal failure (ARF) (HCC)   Occult GI bleeding   History of prostate cancer   Hydronephrosis, bilateral   AKI (acute kidney injury) ([place])   Diabetes mellitus type 2 in nonobese (HCC)   Atrial fibrillation (HCC)   Benign essential HTN   Leukocytosis   Acute blood loss anemia   Hydronephrosis   Protein-calorie malnutrition, severe  Acute renal failure with bilateral hydronephrosis and UTI: Suspect chronic element to kidney disease with HTN, DM, but acutely worsened due to postrenal obstruction. FENa 0.68%. No uremic symptoms at this time. - Ureteral stents placed 6/10. Op note states bladder was edematous, neovascularized, bleeding easily with extremely limited capacity (~30cc), explaining nocturia/frequency and hematuria.  - Repeat renal US 6/11 with persistent moderate bilateral hydronephrosis, right ureteral stent not well visualized  - Bilateral nephrostomy tubes placed 6/12, with urine output - Kidney function with  improvement today  - Urology follow up 6/21   Hypernatremia - Nephrology following - Continue to encourage oral fluid intake, trend BMP   Coag negative staph UTI - Blood cultures negative to date  - Continue doxycycline  - Worsening leukocytosis, repeat UA with urine culture pending   Weakness, falls: No syncope. Suspect due subacutely to deconditioning post-CVA, with low grade uremia contributing and acutely worsened by infection. TSH wnl. - PT consult, recommending CIR eval   Normocytic anemia - Stable   History of prostate CA s/p IMRT Sep 2012: With increased LUTS, UTI, unintentional weight loss and reported painless hematuria.  - Repeat PSA 0.05   Occult GI bleeding: +FOBT in the setting of NOAC + renal failure. Fortunately no anemia on admission. With h/o radiation proctitis, last colonoscopy by [facility] reportedly negative, no f/u recommended at that time.  - Discussed risks/benefits of holding anticoagulation in this setting, will give anticoag for now  - No indication for urgent work up. Will follow up with Dr. Ardis Hughs  IDT2DM: HbA1c 6.8% in 2017, controlled for age. - Hold levemir for now with poor po - SSI, titrate long-acting as needed  Paroxysmal atrial flutter - Currently RRR - Eliquis   Essential HTN - Holding lisinopril and thiazide diuretic due to renal failure. Will stop on discharge  - Start norvasc. Well controlled.   Insomnia/sundowning - Ativan PO prn, trazodone PO qhs   M-spike: Reported - Hematology referral to Dr. Beryle Beams as outpatient in progress  OSA: - Does not tolerate PAP qHS  Severe malnutrition in context of chronic illness - Per dietitian   DVT prophylaxis: Eliquis  Code Status: Full Family  Communication: Family at bedside Disposition Plan: Pending CIR    Consultants:   Urology Dr. Junious Silk  Nephrology Dr. Archie Balboa  IR   Procedures:   Cystoscopy and ureteral stents placement 05/07/2017 by Dr.  Junious Silk  Bilateral nephrostomy tube placement 05/09/2017 by Dr. Earleen Newport   Antimicrobials:  Anti-infectives    Start     Dose/Rate Route Frequency Ordered Stop   05/09/17 1345  ciprofloxacin (CIPRO) IVPB 400 mg    Comments:  Pharmacist called re: this drug and creatinine. Drug approved by pharmacist for one time dose. Dr. Earleen Newport aware.   400 mg 200 mL/hr over 60 Minutes Intravenous To Radiology 05/09/17 1328 05/09/17 1448   05/09/17 1318  ciprofloxacin (CIPRO) 400 MG/200ML IVPB    Comments:  [place], Jamie   : cabinet override      05/09/17 1318 05/09/17 1348   05/08/17 1500  doxycycline (VIBRA-TABS) tablet 100 mg     100 mg Oral Every 12 hours 05/08/17 1427     05/06/17 2200  cefTRIAXone (ROCEPHIN) 1 g in dextrose 5 % 50 mL IVPB     1 g 100 mL/hr over 30 Minutes Intravenous Every 24 hours 05/06/17 2029 05/07/17 2208         Subjective: Doing well today, slept well. No complaints, no pain. Afebrile overnight, no complaints of fever, chills, shortness of breath, cough, chest pain, abdominal pain.    Objective: Vitals:   05/11/17 1728 05/11/17 2202 05/12/17 0454 05/12/17 0916  BP: (!) 167/90 (!) 175/87 135/69 138/66  Pulse: 84 75 79 83  Resp:  18 18 18   Temp: 97.9 F (36.6 C) 97.6 F (36.4 C) 97.7 F (36.5 C) 97.9 F (36.6 C)  TempSrc: Oral Axillary Axillary Oral  SpO2: 97% 98% 98% 97%  Weight:  94.3 kg (208 lb)    Height:  6\' 2"  (1.88 m)      Intake/Output Summary (Last 24 hours) at 05/12/17 1312 Last data filed at 05/12/17 1100  Gross per 24 hour  Intake              380 ml  Output             1735 ml  Net            -1355 ml   Filed Weights   05/09/17 2100 05/11/17 0229 05/11/17 2202  Weight: 96.7 kg (213 lb 3 oz) 82.3 kg (181 lb 8 oz) 94.3 kg (208 lb)    Examination:  General exam: Appears calm and comfortable  Respiratory system: Clear to auscultation. Respiratory effort normal. Cardiovascular system: S1 & S2 heard, RRR. No JVD, murmurs, rubs, gallops  or clicks. No pedal edema. Gastrointestinal system: Abdomen is nondistended, soft and nontender. No organomegaly or masses felt. Normal bowel sounds heard. Central nervous system: Nonfocal  GU: bilateral nephrostomy tubes draining urine Extremities: Symmetric 5 x 5 power. Skin: No rashes, lesions or ulcers  Data Reviewed: I have personally reviewed following labs and imaging studies  CBC:  Recent Labs Lab 05/08/17 0442 05/09/17 0608 05/10/17 0510 05/11/17 0307 05/12/17 0421  WBC 11.9* 13.4* 10.3 13.9* 16.1*  HGB 11.8* 11.3* 12.6* 12.8* 11.9*  HCT 35.7* 33.7* 38.1* 39.7 36.9*  MCV 90.6 90.1 91.8 91.9 93.2  PLT 202 195 199 219 735   Basic Metabolic Panel:  Recent Labs Lab 05/09/17 0608 05/09/17 1531 05/10/17 0510 05/11/17 0307 05/12/17 0421  NA 140 141 142 146* 147*  K 5.0 5.2* 4.6 4.6 4.3  CL 114* 113* 111 113*  112*  CO2 17* 14* 18* 23 25  GLUCOSE 169* 188* 133* 149* 166*  BUN 95* 99* 76* 58* 43*  CREATININE 4.64* 4.95* 3.23* 2.03* 1.57*  CALCIUM 8.4* 8.7* 8.7* 8.9 8.7*  PHOS  --  5.6*  --  3.1 3.0   GFR: Estimated Creatinine Clearance: 43.6 mL/min (A) (by C-G formula based on SCr of 1.57 mg/dL (H)). Liver Function Tests:  Recent Labs Lab 05/09/17 1531 05/11/17 0307 05/12/17 0421  ALBUMIN 2.0* 2.1* 2.1*   No results for input(s): LIPASE, AMYLASE in the last 168 hours. No results for input(s): AMMONIA in the last 168 hours. Coagulation Profile:  Recent Labs Lab 05/09/17 1038  INR 1.22   Cardiac Enzymes: No results for input(s): CKTOTAL, CKMB, CKMBINDEX, TROPONINI in the last 168 hours. BNP (last 3 results) No results for input(s): PROBNP in the last 8760 hours. HbA1C: No results for input(s): HGBA1C in the last 72 hours. CBG:  Recent Labs Lab 05/11/17 1234 05/11/17 1734 05/11/17 2207 05/12/17 0726 05/12/17 1240  GLUCAP 211* 152* 159* 158* 194*   Lipid Profile: No results for input(s): CHOL, HDL, LDLCALC, TRIG, CHOLHDL, LDLDIRECT in the  last 72 hours. Thyroid Function Tests: No results for input(s): TSH, T4TOTAL, FREET4, T3FREE, THYROIDAB in the last 72 hours. Anemia Panel:  Recent Labs  05/11/17 0959  VITAMINB12 426  FERRITIN 581*  TIBC 154*  IRON 77   Sepsis Labs: No results for input(s): PROCALCITON, LATICACIDVEN in the last 168 hours.  Recent Results (from the past 240 hour(s))  Urine Culture     Status: Abnormal   Collection Time: 05/06/17  4:43 PM  Result Value Ref Range Status   Specimen Description URINE, RANDOM  Final   Special Requests NONE  Final   Culture (A)  Final    50,000 COLONIES/mL STAPHYLOCOCCUS SPECIES (COAGULASE NEGATIVE)   Report Status 05/08/2017 FINAL  Final   Organism ID, Bacteria STAPHYLOCOCCUS SPECIES (COAGULASE NEGATIVE) (A)  Final      Susceptibility   Staphylococcus species (coagulase negative) - MIC*    CIPROFLOXACIN >=8 RESISTANT Resistant     GENTAMICIN <=0.5 SENSITIVE Sensitive     NITROFURANTOIN <=16 SENSITIVE Sensitive     OXACILLIN >=4 RESISTANT Resistant     TETRACYCLINE <=1 SENSITIVE Sensitive     VANCOMYCIN <=0.5 SENSITIVE Sensitive     TRIMETH/SULFA <=10 SENSITIVE Sensitive     CLINDAMYCIN >=8 RESISTANT Resistant     RIFAMPIN <=0.5 SENSITIVE Sensitive     Inducible Clindamycin NEGATIVE Sensitive     * 50,000 COLONIES/mL STAPHYLOCOCCUS SPECIES (COAGULASE NEGATIVE)  Culture, blood (routine x 2)     Status: None   Collection Time: 05/06/17  4:53 PM  Result Value Ref Range Status   Specimen Description BLOOD RIGHT FOREARM  Final   Special Requests   Final    BOTTLES DRAWN AEROBIC AND ANAEROBIC Blood Culture adequate volume   Culture NO GROWTH 5 DAYS  Final   Report Status 05/11/2017 FINAL  Final  Culture, blood (routine x 2)     Status: None   Collection Time: 05/06/17  5:10 PM  Result Value Ref Range Status   Specimen Description BLOOD LEFT ANTECUBITAL  Final   Special Requests   Final    BOTTLES DRAWN AEROBIC AND ANAEROBIC Blood Culture adequate volume    Culture NO GROWTH 5 DAYS  Final   Report Status 05/11/2017 FINAL  Final  Surgical pcr screen     Status: None   Collection Time: 05/07/17 10:31 AM  Result Value Ref Range Status   MRSA, PCR NEGATIVE NEGATIVE Final   Staphylococcus aureus NEGATIVE NEGATIVE Final    Comment:        The Xpert SA Assay (FDA approved for NASAL specimens in patients over 13 years of age), is one component of a comprehensive surveillance program.  Test performance has been validated by [facility] for patients greater than or equal to 78 year old. It is not intended to diagnose infection nor to guide or monitor treatment.   Culture, Urine     Status: None   Collection Time: 05/09/17  2:16 PM  Result Value Ref Range Status   Specimen Description KIDNEY LEFT  Final   Special Requests NONE  Final   Culture NO GROWTH  Final   Report Status 05/10/2017 FINAL  Final  Culture, Urine     Status: None   Collection Time: 05/09/17  2:16 PM  Result Value Ref Range Status   Specimen Description KIDNEY RIGHT  Final   Special Requests NONE  Final   Culture NO GROWTH  Final   Report Status 05/10/2017 FINAL  Final       Radiology Studies: No results found.    Scheduled Meds: . amLODipine  5 mg Oral QHS  . apixaban  5 mg Oral BID  . doxycycline  100 mg Oral Q12H  . feeding supplement (ENSURE ENLIVE)  237 mL Oral BID BM  . insulin aspart  0-9 Units Subcutaneous TID WC  . mouth rinse  15 mL Mouth Rinse BID  . multivitamin  1 tablet Oral QHS  . pravastatin  40 mg Oral Q supper  . sodium bicarbonate  1,300 mg Oral TID  . sodium chloride flush  3 mL Intravenous Q12H  . traZODone  50 mg Oral QHS   Continuous Infusions:    LOS: 6 days    Time spent: 30 minutes   Dessa Phi, DO [facility] [email or web address] Password [email or web address] 05/12/2017, 1:12 PM

## 2017-05-13 ENCOUNTER — Encounter (HOSPITAL_COMMUNITY): Payer: Self-pay | Admitting: *Deleted

## 2017-05-13 ENCOUNTER — Inpatient Hospital Stay (HOSPITAL_COMMUNITY)
Admission: RE | Admit: 2017-05-13 | Discharge: 2017-05-27 | DRG: 948 | Disposition: A | Discharge status: Home Health | Payer: Medicare Other | Source: Intra-hospital | Attending: Physical Medicine & Rehabilitation | Admitting: Physical Medicine & Rehabilitation

## 2017-05-13 DIAGNOSIS — B957 Other staphylococcus as the cause of diseases classified elsewhere: Secondary | ICD-10-CM | POA: Diagnosis present

## 2017-05-13 DIAGNOSIS — N1330 Unspecified hydronephrosis: Secondary | ICD-10-CM | POA: Diagnosis present

## 2017-05-13 DIAGNOSIS — Z794 Long term (current) use of insulin: Secondary | ICD-10-CM | POA: Diagnosis not present

## 2017-05-13 DIAGNOSIS — E119 Type 2 diabetes mellitus without complications: Secondary | ICD-10-CM | POA: Diagnosis present

## 2017-05-13 DIAGNOSIS — R7309 Other abnormal glucose: Secondary | ICD-10-CM

## 2017-05-13 DIAGNOSIS — Z9109 Other allergy status, other than to drugs and biological substances: Secondary | ICD-10-CM | POA: Diagnosis not present

## 2017-05-13 DIAGNOSIS — Z96653 Presence of artificial knee joint, bilateral: Secondary | ICD-10-CM | POA: Diagnosis present

## 2017-05-13 DIAGNOSIS — H9190 Unspecified hearing loss, unspecified ear: Secondary | ICD-10-CM | POA: Diagnosis present

## 2017-05-13 DIAGNOSIS — T8383XA Hemorrhage of genitourinary prosthetic devices, implants and grafts, initial encounter: Secondary | ICD-10-CM

## 2017-05-13 DIAGNOSIS — Z936 Other artificial openings of urinary tract status: Secondary | ICD-10-CM

## 2017-05-13 DIAGNOSIS — Z7901 Long term (current) use of anticoagulants: Secondary | ICD-10-CM | POA: Diagnosis not present

## 2017-05-13 DIAGNOSIS — D62 Acute posthemorrhagic anemia: Secondary | ICD-10-CM | POA: Diagnosis present

## 2017-05-13 DIAGNOSIS — N182 Chronic kidney disease, stage 2 (mild): Secondary | ICD-10-CM | POA: Diagnosis not present

## 2017-05-13 DIAGNOSIS — F411 Generalized anxiety disorder: Secondary | ICD-10-CM | POA: Diagnosis not present

## 2017-05-13 DIAGNOSIS — G479 Sleep disorder, unspecified: Secondary | ICD-10-CM | POA: Diagnosis not present

## 2017-05-13 DIAGNOSIS — I10 Essential (primary) hypertension: Secondary | ICD-10-CM | POA: Diagnosis present

## 2017-05-13 DIAGNOSIS — N2581 Secondary hyperparathyroidism of renal origin: Secondary | ICD-10-CM | POA: Diagnosis not present

## 2017-05-13 DIAGNOSIS — D631 Anemia in chronic kidney disease: Secondary | ICD-10-CM | POA: Diagnosis not present

## 2017-05-13 DIAGNOSIS — R5381 Other malaise: Secondary | ICD-10-CM | POA: Diagnosis present

## 2017-05-13 DIAGNOSIS — D7289 Other specified disorders of white blood cells: Secondary | ICD-10-CM | POA: Diagnosis not present

## 2017-05-13 DIAGNOSIS — N390 Urinary tract infection, site not specified: Secondary | ICD-10-CM | POA: Diagnosis present

## 2017-05-13 DIAGNOSIS — Z87891 Personal history of nicotine dependence: Secondary | ICD-10-CM | POA: Diagnosis not present

## 2017-05-13 DIAGNOSIS — K5900 Constipation, unspecified: Secondary | ICD-10-CM | POA: Diagnosis present

## 2017-05-13 DIAGNOSIS — N183 Chronic kidney disease, stage 3 unspecified: Secondary | ICD-10-CM

## 2017-05-13 DIAGNOSIS — E785 Hyperlipidemia, unspecified: Secondary | ICD-10-CM | POA: Diagnosis present

## 2017-05-13 DIAGNOSIS — Z823 Family history of stroke: Secondary | ICD-10-CM

## 2017-05-13 DIAGNOSIS — Z8673 Personal history of transient ischemic attack (TIA), and cerebral infarction without residual deficits: Secondary | ICD-10-CM

## 2017-05-13 DIAGNOSIS — M1990 Unspecified osteoarthritis, unspecified site: Secondary | ICD-10-CM | POA: Diagnosis present

## 2017-05-13 DIAGNOSIS — R079 Chest pain, unspecified: Secondary | ICD-10-CM | POA: Diagnosis not present

## 2017-05-13 DIAGNOSIS — E7800 Pure hypercholesterolemia, unspecified: Secondary | ICD-10-CM | POA: Diagnosis present

## 2017-05-13 DIAGNOSIS — E8809 Other disorders of plasma-protein metabolism, not elsewhere classified: Secondary | ICD-10-CM | POA: Diagnosis present

## 2017-05-13 DIAGNOSIS — E870 Hyperosmolality and hypernatremia: Secondary | ICD-10-CM | POA: Diagnosis present

## 2017-05-13 DIAGNOSIS — Z8546 Personal history of malignant neoplasm of prostate: Secondary | ICD-10-CM | POA: Diagnosis not present

## 2017-05-13 DIAGNOSIS — G4730 Sleep apnea, unspecified: Secondary | ICD-10-CM | POA: Diagnosis present

## 2017-05-13 DIAGNOSIS — D72829 Elevated white blood cell count, unspecified: Secondary | ICD-10-CM

## 2017-05-13 DIAGNOSIS — Z96652 Presence of left artificial knee joint: Secondary | ICD-10-CM | POA: Diagnosis present

## 2017-05-13 DIAGNOSIS — D649 Anemia, unspecified: Secondary | ICD-10-CM | POA: Diagnosis not present

## 2017-05-13 DIAGNOSIS — Z79899 Other long term (current) drug therapy: Secondary | ICD-10-CM | POA: Diagnosis not present

## 2017-05-13 DIAGNOSIS — I4891 Unspecified atrial fibrillation: Secondary | ICD-10-CM | POA: Diagnosis present

## 2017-05-13 DIAGNOSIS — N179 Acute kidney failure, unspecified: Secondary | ICD-10-CM | POA: Diagnosis present

## 2017-05-13 DIAGNOSIS — I482 Chronic atrial fibrillation: Secondary | ICD-10-CM | POA: Diagnosis present

## 2017-05-13 DIAGNOSIS — R296 Repeated falls: Secondary | ICD-10-CM | POA: Diagnosis present

## 2017-05-13 HISTORY — DX: Calculus of kidney: N20.0

## 2017-05-13 LAB — CBC
HCT: 37.8 % — ABNORMAL LOW (ref 39.0–52.0)
Hemoglobin: 12 g/dL — ABNORMAL LOW (ref 13.0–17.0)
MCH: 29.3 pg (ref 26.0–34.0)
MCHC: 31.7 g/dL (ref 30.0–36.0)
MCV: 92.4 fL (ref 78.0–100.0)
PLATELETS: 219 10*3/uL (ref 150–400)
RBC: 4.09 MIL/uL — ABNORMAL LOW (ref 4.22–5.81)
RDW: 14.1 % (ref 11.5–15.5)
WBC: 19.4 10*3/uL — ABNORMAL HIGH (ref 4.0–10.5)

## 2017-05-13 LAB — GLUCOSE, CAPILLARY
GLUCOSE-CAPILLARY: 181 mg/dL — AB (ref 65–99)
Glucose-Capillary: 167 mg/dL — ABNORMAL HIGH (ref 65–99)
Glucose-Capillary: 201 mg/dL — ABNORMAL HIGH (ref 65–99)
Glucose-Capillary: 177 mg/dL — ABNORMAL HIGH (ref 65–99)

## 2017-05-13 LAB — URINE CULTURE: Culture: NO GROWTH

## 2017-05-13 LAB — RENAL FUNCTION PANEL
Albumin: 2.2 g/dL — ABNORMAL LOW (ref 3.5–5.0)
Anion gap: 8 (ref 5–15)
BUN: 34 mg/dL — AB (ref 6–20)
CHLORIDE: 107 mmol/L (ref 101–111)
CO2: 26 mmol/L (ref 22–32)
CREATININE: 1.47 mg/dL — AB (ref 0.61–1.24)
Calcium: 8.8 mg/dL — ABNORMAL LOW (ref 8.9–10.3)
GFR calc Af Amer: 50 mL/min — ABNORMAL LOW (ref >60)
GFR calc non Af Amer: 43 mL/min — ABNORMAL LOW (ref >60)
GLUCOSE: 177 mg/dL — AB (ref 65–99)
Phosphorus: 2.9 mg/dL (ref 2.5–4.6)
Potassium: 4.3 mmol/L (ref 3.5–5.1)
SODIUM: 141 mmol/L (ref 135–145)

## 2017-05-13 MED ORDER — ONDANSETRON HCL 4 MG PO TABS: 4.0000 mg | ORAL_TABLET | Freq: Four times a day (QID) | ORAL | Status: DC | PRN

## 2017-05-13 MED ORDER — INSULIN ASPART 100 UNIT/ML ~~LOC~~ SOLN
0.0000 [IU] | Freq: Three times a day (TID) | SUBCUTANEOUS | Status: DC
  Administered 2017-05-13 – 2017-05-14 (×4): 2 [IU] via SUBCUTANEOUS
  Administered 2017-05-15: 1 [IU] via SUBCUTANEOUS
  Administered 2017-05-15: 3 [IU] via SUBCUTANEOUS
  Administered 2017-05-15 – 2017-05-16 (×2): 2 [IU] via SUBCUTANEOUS
  Administered 2017-05-16: 5 [IU] via SUBCUTANEOUS
  Administered 2017-05-17: 1 [IU] via SUBCUTANEOUS
  Administered 2017-05-17: 3 [IU] via SUBCUTANEOUS
  Administered 2017-05-18: 1 [IU] via SUBCUTANEOUS
  Administered 2017-05-18: 2 [IU] via SUBCUTANEOUS
  Administered 2017-05-19 – 2017-05-26 (×10): 1 [IU] via SUBCUTANEOUS

## 2017-05-13 MED ORDER — ENSURE ENLIVE PO LIQD: 237.0000 mL | Freq: Two times a day (BID) | ORAL | 12 refills | Status: DC

## 2017-05-13 MED ORDER — TRAZODONE HCL 50 MG PO TABS: 50.0000 mg | ORAL_TABLET | Freq: Every day | ORAL | 0 refills | Status: DC

## 2017-05-13 MED ORDER — RENA-VITE PO TABS
1.0000 | ORAL_TABLET | Freq: Every day | ORAL | Status: DC
  Administered 2017-05-13 – 2017-05-26 (×14): 1 via ORAL
  Filled 2017-05-13 (×14): qty 1

## 2017-05-13 MED ORDER — APIXABAN 5 MG PO TABS
5.0000 mg | ORAL_TABLET | Freq: Two times a day (BID) | ORAL | Status: DC
  Administered 2017-05-13 – 2017-05-27 (×28): 5 mg via ORAL
  Filled 2017-05-13 (×28): qty 1

## 2017-05-13 MED ORDER — AMLODIPINE BESYLATE 5 MG PO TABS: 5.0000 mg | ORAL_TABLET | Freq: Every day | ORAL | 0 refills | Status: DC

## 2017-05-13 MED ORDER — TRAZODONE HCL 50 MG PO TABS
50.0000 mg | ORAL_TABLET | Freq: Every day | ORAL | Status: DC
  Administered 2017-05-13 – 2017-05-18 (×6): 50 mg via ORAL
  Filled 2017-05-13 (×6): qty 1

## 2017-05-13 MED ORDER — DEXTROSE 5 % IV SOLN: 1.0000 g | INTRAVENOUS | Status: DC

## 2017-05-13 MED ORDER — PRAVASTATIN SODIUM 20 MG PO TABS
40.0000 mg | ORAL_TABLET | Freq: Every day | ORAL | Status: DC
  Administered 2017-05-13 – 2017-05-26 (×13): 40 mg via ORAL
  Filled 2017-05-13 (×13): qty 2

## 2017-05-13 MED ORDER — LORAZEPAM 0.5 MG PO TABS
1.0000 mg | ORAL_TABLET | ORAL | Status: DC | PRN
  Administered 2017-05-16 – 2017-05-22 (×6): 1 mg via ORAL
  Administered 2017-05-23: 0.5 mg via ORAL
  Filled 2017-05-13 (×8): qty 2

## 2017-05-13 MED ORDER — ENSURE ENLIVE PO LIQD
237.0000 mL | Freq: Two times a day (BID) | ORAL | Status: DC
  Administered 2017-05-13 – 2017-05-16 (×6): 237 mL via ORAL

## 2017-05-13 MED ORDER — DEXTROSE 5 % IV SOLN
1.0000 g | INTRAVENOUS | Status: DC
  Administered 2017-05-13: 1 g via INTRAVENOUS
  Filled 2017-05-13: qty 10

## 2017-05-13 MED ORDER — SORBITOL 70 % SOLN: 30.0000 mL | Freq: Every day | Status: DC | PRN

## 2017-05-13 MED ORDER — DEXTROSE 5 % IV SOLN
1.0000 g | INTRAVENOUS | Status: DC
  Administered 2017-05-14 – 2017-05-19 (×5): 1 g via INTRAVENOUS
  Filled 2017-05-13 (×6): qty 10

## 2017-05-13 MED ORDER — ONDANSETRON HCL 4 MG/2ML IJ SOLN: 4.0000 mg | Freq: Four times a day (QID) | INTRAMUSCULAR | Status: DC | PRN

## 2017-05-13 MED ORDER — TRAZODONE HCL 50 MG PO TABS: 50.0000 mg | ORAL_TABLET | Freq: Every evening | ORAL | Status: DC | PRN

## 2017-05-13 MED ORDER — AMLODIPINE BESYLATE 5 MG PO TABS
5.0000 mg | ORAL_TABLET | Freq: Every day | ORAL | Status: DC
  Administered 2017-05-13 – 2017-05-26 (×14): 5 mg via ORAL
  Filled 2017-05-13 (×14): qty 1

## 2017-05-13 NOTE — Discharge Summary (Signed)
Physician Discharge Summary  William [name]:[id number] DOB: Feb 05, 1934 DOA: 05/06/2017  PCP: Jani Gravel, MD  Admit date: 05/06/2017 Discharge date: 05/13/2017  Admitted From: Home Disposition:  CIR  Recommendations for Outpatient Follow-up:  1. Follow up with PCP in 1 week fter discharge. 2. Follow-up with urology, appointment is scheduled for 6/21. 3. Follow-up with nephrology in 1 week after discharge. 4. Follow up on patient's urine culture results. Patient will be discharged to inpatient rehabilitation with IV Rocephin, pending culture results and sensitivity. Follow patient's WBC, creatinine.  Discharge Condition: Stable CODE STATUS: Full  Diet recommendation: Heart healthy   Brief/Interim Summary: William Lucarelli [name] PY21 y.o.malewith a history of AFib on eliquis, CVA in Nov 2017, HTN, IDDM, prostate CA, andOA s/p bilateral TKA who was brought to the ED by his familyon 6/9 due to progressive generalized weakness, worsening in the past few weeks,leading to recurrent falls. Workup revealed acute kidney injury, leukocytosis with evidence of UTI, bilateral hydronephrosis on renal ultrasound. Urology was consulted and patient underwent ureteral stent placement on 6/10. Due to lack of improvement in creatinine function, ultrasound of kidneys was repeated which showed ureteral stent failure. Nephrology was also consulted. Patient ultimately underwent bilateral nephrostomy tube placement by IR on 6/12. Patient's creatinine continued to improve, patient had good urine output. He was on doxycycline due to staph in his initial blood culture. However, patient started to developed leukocytosis which continued to rise. Urinalysis was obtained on 6/15, consistent with urinary tract infection. Urine culture is pending at this time. He was evaluated by physical therapy, recommended for inpatient rehabilitation. Patient will be discharged to inpatient rehabilitation, with IV Rocephin for his urinary tract  infection. I have discussed case with rehabilitation coordinator; they're able to give him IV antibiotics, follow culture results and sensitivity, adjust medication as needed. I have also asked them to follow patient's leukocytosis as well. Patient needs to follow up with urology, nephrology, primary care physician after discharge from inpatient rehabilitation.  Discharge Diagnoses:  Principal Problem:   Acute lower UTI Active Problems:   IDDM (insulin dependent diabetes mellitus) ([place])   Prostate cancer (HCC)   Sleep apnea   Acute renal failure (ARF) (HCC)   Occult GI bleeding   History of prostate cancer   Hydronephrosis, bilateral   AKI (acute kidney injury) ([place])   Diabetes mellitus type 2 in nonobese (HCC)   Atrial fibrillation (HCC)   Benign essential HTN   Leukocytosis   Acute blood loss anemia   Hydronephrosis   Protein-calorie malnutrition, severe  Acute renal failure with bilateral hydronephrosis and UTI: Suspect chronic element to kidney disease with HTN, DM, but acutely worsened due to postrenal obstruction. FENa 0.68%. No uremic symptoms at this time. - Ureteral stents placed 6/10. Op note states bladder was edematous, neovascularized, bleeding easily with extremely limited capacity (~30cc), explaining nocturia/frequency and hematuria.  - Repeat renal US 6/11 with persistent moderate bilateral hydronephrosis, right ureteral stent not well visualized  - Bilateral nephrostomy tubes placed 6/12, with urine output - Kidney function with improvement today  - Urology follow up outpatient   Hypernatremia - Nephrology following - Resolved. Stop Na bicarb tabs   UTI, unspecified organism, not POA  - Patient was initially treated for coag-negative staph UTI with doxycycline. Blood cultures have been negative to date. However, patient started to develop worsening leukocytosis, obtained UA. Urine culture is pending at this time. Continue IV Rocephin until culture results,  sensitivity has resulted. Continue to trend his CBC in inpatient  rehabilitation.  Weakness, falls: No syncope. Suspect due subacutely to deconditioning post-CVA, with low grade uremia contributing and acutely worsened by infection. TSH wnl. - PT consult, recommending CIR eval   Normocytic anemia - Stable   History of prostate CA s/p IMRT Sep 2012: With increased LUTS, UTI, unintentional weight loss and reported painless hematuria.  - Repeat PSA 0.05   Occult GI bleeding: +FOBT in the setting of NOAC + renal failure. Fortunately no anemia on admission. With h/o radiation proctitis, last colonoscopy by [facility] reportedly negative, no f/u recommended at that time.  - Discussed risks/benefits of holding anticoagulation in this setting, will giveanticoag for now  - No indication for urgent work up. Will follow up with Dr. Ardis Hughs  IDT2DM: HbA1c 6.8% in 2017, controlled for age. - Stop metformin due to renal failure  - SSI  Paroxysmal atrial flutter - Currently RRR - Eliquis   Essential HTN - Holding lisinopril and thiazide diuretic due to renal failure. Will stop on discharge  - Start norvasc. Well controlled.   Insomnia/sundowning - Ativan PO prn, trazodone PO qhs   M-spike: Reported - Hematology referral to Dr. Beryle Beams as outpatient in progress  OSA: - Does not tolerate PAP qHS  Severe malnutrition in context of chronic illness - Per dietitian    Discharge Instructions  Discharge Instructions    Diet - low sodium heart healthy    Complete by:  As directed    Increase activity slowly    Complete by:  As directed      Allergies as of 05/13/2017      Reactions   Glyburide Other (See Comments)   Hypoglycemia      Medication List    STOP taking these medications   amoxicillin 500 MG capsule Commonly known as:  AMOXIL   lisinopril-hydrochlorothiazide 10-12.5 MG tablet Commonly known as:  PRINZIDE,ZESTORETIC   metFORMIN 1000 MG tablet Commonly  known as:  GLUCOPHAGE   ONGLYZA 5 MG Tabs tablet Generic drug:  saxagliptin HCl     TAKE these medications   acetaminophen 325 MG tablet Commonly known as:  TYLENOL Take 325-650 mg by mouth every 6 (six) hours as needed for mild pain.   amLODipine 5 MG tablet Commonly known as:  NORVASC Take 1 tablet (5 mg total) by mouth at bedtime.   apixaban 5 MG Tabs tablet Commonly known as:  ELIQUIS Take 1 tablet (5 mg total) by mouth 2 (two) times daily.   cefTRIAXone 1 g in dextrose 5 % 50 mL Inject 1 g into the vein daily. Start taking on:  05/14/2017   Co Q-10 300 MG Caps Take 300 mg by mouth daily with breakfast.   feeding supplement (ENSURE ENLIVE) Liqd Take 237 mLs by mouth 2 (two) times daily between meals.   Glucosamine-Chondroitin Tabs Take 1 tablet by mouth 2 (two) times daily.   HUMALOG KWIKPEN 100 UNIT/ML KiwkPen Generic drug:  insulin lispro Inject 6 Units into the skin 3 (three) times daily before meals.   LEVEMIR FLEXTOUCH 100 UNIT/ML Pen Generic drug:  Insulin Detemir Inject 16 Units into the skin at bedtime.   loperamide 2 MG tablet Commonly known as:  IMODIUM A-D Take 2 mg by mouth 4 (four) times daily as needed for diarrhea or loose stools.   pravastatin 40 MG tablet Commonly known as:  PRAVACHOL Take 40 mg by mouth daily with supper.   traZODone 50 MG tablet Commonly known as:  DESYREL Take 1 tablet (50 mg total) by mouth  at bedtime.      Follow-up Information    Festus Aloe, MD In 1 week.   Specialty:  Urology Why:  Please contact office on Monday 05/15/2017 to schedule a follow up appointment. Thank you.   Contact information: [place] [id number] [phone]        Mauricia Area, MD. Schedule an appointment as soon as possible for a visit in 1 week.   Specialty:  Nephrology Why:  Please contact office on Monday 05/15/2017 to schedule a follow up appointment. Thank you.  Contact information: [address] [place] [address] [phone]          Allergies  Allergen Reactions  . Glyburide Other (See Comments)    Hypoglycemia     Consultations:  Urology  Nephrology  IR    Procedures/Studies: Ct Abdomen Pelvis Wo Contrast  Result Date: 04/14/2017 CLINICAL DATA:  Weight loss since 10/2016. Shob.Surgery: hernia.Cancer: prostate with radiation tmnt. EXAM: CT CHEST, ABDOMEN AND PELVIS WITHOUT CONTRAST TECHNIQUE: Multidetector CT imaging of the chest, abdomen and pelvis was performed following the standard protocol without IV contrast. COMPARISON:  Abdomen and pelvis CT, 01/20/2005 FINDINGS: CT CHEST FINDINGS Cardiovascular: The heart is normal in size. No pericardial effusion. There are dense three-vessel coronary artery calcifications. Great vessels are normal in caliber. Mediastinum/Nodes: No enlarged mediastinal, hilar, or axillary lymph nodes. Thyroid gland, trachea, and esophagus demonstrate no significant findings. Lungs/Pleura: No lung mass or suspicious nodule. No evidence of pneumonia or pulmonary edema. Peripheral interstitial thickening is noted mostly in lower lobes at the lung bases consistent with mild chronic scarring similar to the lung bases on the prior abdomen and pelvis CT. No pleural effusion or pneumothorax. CT ABDOMEN PELVIS FINDINGS Hepatobiliary: Liver is unremarkable. There are gallstones in a mildly distended gallbladder. No wall thickening or inflammation or evidence of acute cholecystitis. No bile duct dilation. Pancreas: Unremarkable. No pancreatic ductal dilatation or surrounding inflammatory changes. Spleen: Normal in size without focal abnormality. Adrenals/Urinary Tract: No adrenal masses. Moderate left hydronephrosis. The left ureter is mildly dilated to the level of the pelvic brim decompressed distal to this. There is no ureteral stone. An 8 mm stone lies in the lower pole of the left kidney. No other intrarenal stones. No right intrarenal stones. No  renal masses. Mild bilateral renal cortical thinning. No right hydronephrosis. Normal right ureter. Bladder is mostly decompressed but grossly unremarkable. Stomach/Bowel: Stomach and small bowel are unremarkable. There multiple sigmoid colon diverticula. No diverticulitis. A portion of the proximal sigmoid colon enters a small left inguinal hernia. This was present on the prior CT. No obstruction, incarceration or strangulation. No other colon abnormality. Normal appendix is visualized. Vascular/Lymphatic: Aortic atherosclerosis. No enlarged abdominal or pelvic lymph nodes. Reproductive: Prostate gland is normal in size. Other: No other abdominal wall hernia.  No ascites. MUSCULOSKELETAL FINDINGS No fracture or acute finding. No osteoblastic or osteolytic lesions. IMPRESSION: CHEST CT 1. No acute findings.  No evidence of malignancy in the chest. 2. Dense three-vessel coronary artery calcifications. ABDOMEN AND PELVIS CT 1. Moderate left hydronephrosis with mild dilation of the proximal and middle portions of the left ureter, without evidence of a ureteral stone or other obstructing cause. Some of the apparent hydronephrosis may be due to renal sinus cysts, which with evident on the prior CT. This would be better evaluated with a contrast-enhanced CT scan. 2. No other evidence of an acute abnormality within the abdomen or pelvis. 3. No evidence of malignancy within the  abdomen or pelvis. 4. The mm nonobstructing lower pole left intrarenal stones similar to the prior CT. 5. Sigmoid colon diverticulosis. 6. Gallstones. 7. Aortic atherosclerosis. Electronically Signed   By: Lajean Manes M.D.   On: 04/14/2017 09:54   Dg Abd 1 View  Result Date: 05/09/2017 CLINICAL DATA:  Evaluate stent placement. EXAM: ABDOMEN - 1 VIEW COMPARISON:  CT, 04/14/2017 FINDINGS: There are bilateral ureteral stents, which appear well-positioned, and are stable when compared to the retrograde ureteral pyelogram dated 05/07/2017. Two  stones project in the lower pole the left kidney, stable from the prior CT. No other evidence of renal stones. No evidence of a ureteral stone. Normal bowel gas pattern. No acute skeletal abnormality. IMPRESSION: Stable bilateral ureteral stents when compared to the previous retrograde new ureteral pyelogram. 2 nonobstructing stones in the lower pole the left kidney, also stable. Electronically Signed   By: Lajean Manes M.D.   On: 05/09/2017 12:53   Ct Chest Wo Contrast  Result Date: 04/14/2017 CLINICAL DATA:  Weight loss since 10/2016. Shob.Surgery: hernia.Cancer: prostate with radiation tmnt. EXAM: CT CHEST, ABDOMEN AND PELVIS WITHOUT CONTRAST TECHNIQUE: Multidetector CT imaging of the chest, abdomen and pelvis was performed following the standard protocol without IV contrast. COMPARISON:  Abdomen and pelvis CT, 01/20/2005 FINDINGS: CT CHEST FINDINGS Cardiovascular: The heart is normal in size. No pericardial effusion. There are dense three-vessel coronary artery calcifications. Great vessels are normal in caliber. Mediastinum/Nodes: No enlarged mediastinal, hilar, or axillary lymph nodes. Thyroid gland, trachea, and esophagus demonstrate no significant findings. Lungs/Pleura: No lung mass or suspicious nodule. No evidence of pneumonia or pulmonary edema. Peripheral interstitial thickening is noted mostly in lower lobes at the lung bases consistent with mild chronic scarring similar to the lung bases on the prior abdomen and pelvis CT. No pleural effusion or pneumothorax. CT ABDOMEN PELVIS FINDINGS Hepatobiliary: Liver is unremarkable. There are gallstones in a mildly distended gallbladder. No wall thickening or inflammation or evidence of acute cholecystitis. No bile duct dilation. Pancreas: Unremarkable. No pancreatic ductal dilatation or surrounding inflammatory changes. Spleen: Normal in size without focal abnormality. Adrenals/Urinary Tract: No adrenal masses. Moderate left hydronephrosis. The left  ureter is mildly dilated to the level of the pelvic brim decompressed distal to this. There is no ureteral stone. An 8 mm stone lies in the lower pole of the left kidney. No other intrarenal stones. No right intrarenal stones. No renal masses. Mild bilateral renal cortical thinning. No right hydronephrosis. Normal right ureter. Bladder is mostly decompressed but grossly unremarkable. Stomach/Bowel: Stomach and small bowel are unremarkable. There multiple sigmoid colon diverticula. No diverticulitis. A portion of the proximal sigmoid colon enters a small left inguinal hernia. This was present on the prior CT. No obstruction, incarceration or strangulation. No other colon abnormality. Normal appendix is visualized. Vascular/Lymphatic: Aortic atherosclerosis. No enlarged abdominal or pelvic lymph nodes. Reproductive: Prostate gland is normal in size. Other: No other abdominal wall hernia.  No ascites. MUSCULOSKELETAL FINDINGS No fracture or acute finding. No osteoblastic or osteolytic lesions. IMPRESSION: CHEST CT 1. No acute findings.  No evidence of malignancy in the chest. 2. Dense three-vessel coronary artery calcifications. ABDOMEN AND PELVIS CT 1. Moderate left hydronephrosis with mild dilation of the proximal and middle portions of the left ureter, without evidence of a ureteral stone or other obstructing cause. Some of the apparent hydronephrosis may be due to renal sinus cysts, which with evident on the prior CT. This would be better evaluated with a contrast-enhanced CT scan. 2.  No other evidence of an acute abnormality within the abdomen or pelvis. 3. No evidence of malignancy within the abdomen or pelvis. 4. The mm nonobstructing lower pole left intrarenal stones similar to the prior CT. 5. Sigmoid colon diverticulosis. 6. Gallstones. 7. Aortic atherosclerosis. Electronically Signed   By: Lajean Manes M.D.   On: 04/14/2017 09:54   Dg Retrograde Pyelogram  Result Date: 05/07/2017 CLINICAL DATA:   Nephrolithiasis EXAM: RETROGRADE PYELOGRAM; DG C-ARM 61-120 MIN COMPARISON:  CT abdomen and pelvis Apr 14, 2017 FLUOROSCOPY TIME:  2 minutes 38 seconds; 7 acquired images FINDINGS: Retrograde filling of both ureters and collecting systems was performed intraoperatively. On the right, there is dilatation of the distal right ureter with an apparent stricture in the ureter at the mid sacral level. There is a rather abrupt band in the proximal right ureter just distal to the right ureteropelvic junction. No calculus is seen on this study on the right. On the left, there is a filling defect in the proximal left ureter consistent with a calculus. There is also a calculus in a lower pole calyx on the left. There is fullness of the left renal pelvis. Double-J stents were subsequently placed bilaterally. IMPRESSION: Calculi in the proximal left ureter in lower pole left renal calyx with double-J stent subsequently placed on the left. On the right, apparent stricture in the right ureter at the mid sacral level. Abrupt turn with possible kink in the proximal right ureter with double-J stent placed on the right. Electronically Signed   By: Lowella Grip III M.D.   On: 05/07/2017 15:14   US Renal  Result Date: 05/08/2017 CLINICAL DATA:  Hydronephrosis.  History of prostate cancer. EXAM: RENAL / URINARY TRACT ULTRASOUND COMPLETE COMPARISON:  Ultrasound of May 06, 2017. FINDINGS: Right Kidney: Length: 12.9 cm. Echogenicity within normal limits. Right ureteral stent is not well visualized. No mass visualized. Moderate hydronephrosis is noted. Left Kidney: Length: 14.3 cm. Echogenicity within normal limits. No mass visualized. Moderate hydronephrosis is noted. Left ureteral stent is noted. Bladder: Foley catheter is noted in urinary bladder. Bladder appears to be otherwise normal for degree of distension. IMPRESSION: Moderate bilateral hydronephrosis is noted. Left ureteral stent is noted. Right ureteral stent is not well  visualized. Electronically Signed   By: Marijo Conception, M.D.   On: 05/08/2017 12:29   US Renal  Result Date: 05/06/2017 CLINICAL DATA:  Acute renal failure EXAM: RENAL / URINARY TRACT ULTRASOUND COMPLETE COMPARISON:  CT abdomen/ pelvis dated 04/14/2017 FINDINGS: Right Kidney: Length: 13.8 cm.  Moderate right hydronephrosis, new. Left Kidney: Length: 14.8 cm.  Moderate left hydronephrosis, unchanged Bladder: Not discretely visualized. Additional comments:  Right pleural effusion.  Ascites. IMPRESSION: Moderate left hydronephrosis, unchanged from prior CT. Moderate right hydronephrosis, new. Bladder is not discretely visualized. Electronically Signed   By: Julian Hy M.D.   On: 05/06/2017 18:56   Dg C-arm 1-60 Min  Result Date: 05/07/2017 CLINICAL DATA:  Nephrolithiasis EXAM: RETROGRADE PYELOGRAM; DG C-ARM 61-120 MIN COMPARISON:  CT abdomen and pelvis Apr 14, 2017 FLUOROSCOPY TIME:  2 minutes 38 seconds; 7 acquired images FINDINGS: Retrograde filling of both ureters and collecting systems was performed intraoperatively. On the right, there is dilatation of the distal right ureter with an apparent stricture in the ureter at the mid sacral level. There is a rather abrupt band in the proximal right ureter just distal to the right ureteropelvic junction. No calculus is seen on this study on the right. On the left, there  is a filling defect in the proximal left ureter consistent with a calculus. There is also a calculus in a lower pole calyx on the left. There is fullness of the left renal pelvis. Double-J stents were subsequently placed bilaterally. IMPRESSION: Calculi in the proximal left ureter in lower pole left renal calyx with double-J stent subsequently placed on the left. On the right, apparent stricture in the right ureter at the mid sacral level. Abrupt turn with possible kink in the proximal right ureter with double-J stent placed on the right. Electronically Signed   By: Lowella Grip III  M.D.   On: 05/07/2017 15:14   Ir Nephrostomy Placement Left  Result Date: 05/09/2017 INDICATION: 83 year old male with acute renal failure and bilateral hydronephrosis. The patient has known left-sided nephrolithiasis, with retrograde pyelograms performed recently (05/07/2017) and bilateral ureteral stent placement. Despite stenting hydronephrosis has worsened with worsening renal failure. He has been referred for therapeutic bilateral percutaneous nephrostomy evaluation. EXAM: IR NEPHROSTOMY PLACEMENT LEFT IR NEPHROSTOMY PLACEMENT RIGHT COMPARISON:  None. MEDICATIONS: Ciprofloxacin 400 mg IV; The antibiotic was administered in an appropriate time frame prior to skin puncture. ANESTHESIA/SEDATION: Fentanyl 100 mcg IV; Versed 2.0 mg IV Moderate Sedation Time:  21 minutes The patient was continuously monitored during the procedure by the interventional radiology nurse under my direct supervision. CONTRAST:  15 cc - administered into the collecting system(s) FLUOROSCOPY TIME:  Fluoroscopy Time: 1 minutes 18 seconds (6 mGy). COMPLICATIONS: None PROCEDURE: Informed written consent was obtained from the patient after a thorough discussion of the procedural risks, benefits and alternatives. All questions were addressed. Maximal Sterile Barrier Technique was utilized including caps, mask, sterile gowns, sterile gloves, sterile drape, hand hygiene and skin antiseptic. A timeout was performed prior to the initiation of the procedure. Left: Patient positioned prone position on the fluoroscopy table. Ultrasound survey of the left flank was performed with images stored and sent to PACs. The patient was then prepped and draped in the usual sterile fashion. 1% lidocaine was used to anesthetize the skin and subcutaneous tissues for local anesthesia. A Chiba needle was then used to access a posterior inferior calyx with ultrasound guidance. With spontaneous urine returned through the needle, passage of an 018 micro wire into the  collecting system was performed under fluoroscopy. A small incision was made with an 11 blade scalpel, and the needle was removed from the wire. An Accustick system was then advanced over the wire into the collecting system under fluoroscopy. The metal stiffener and inner dilator were removed, and then a sample of fluid was aspirated through the 4 French outer sheath. Bentson wire was passed into the collecting system and the sheath removed. Ten French dilation of the soft tissues was performed. Using modified Seldinger technique, a 10 French pigtail catheter drain was placed over the Bentson wire. Wire and inner stiffener removed, and the pigtail was formed in the collecting system. Small amount of contrast confirmed position of the catheter. Right: Ultrasound survey of the right flank was then performed with images stored and sent to PACs. The patient was then prepped and draped in the usual sterile fashion. 1% lidocaine was used to anesthetize the skin and subcutaneous tissues for local anesthesia. A Chiba needle was then used to access a posterior inferior calyx with ultrasound guidance. With spontaneous urine returned through the needle, passage of an 018 micro wire into the collecting system was performed under fluoroscopy. A small incision was made with an 11 blade scalpel, and the needle was removed  from the wire. An Accustick system was then advanced over the wire into the collecting system under fluoroscopy. The metal stiffener and inner dilator were removed, and then a sample of fluid was aspirated through the 4 French outer sheath. Bentson wire was passed into the collecting system and the sheath removed. Ten French dilation of the soft tissues was performed. Using modified Seldinger technique, a 10 French pigtail catheter drain was placed over the Bentson wire. Wire and inner stiffener removed, and the pigtail was formed in the collecting system. Small amount of contrast confirmed position of the  catheter. Patient tolerated the procedure well and remained hemodynamically stable throughout. No complications were encountered and no significant blood loss encountered IMPRESSION: Status post bilateral percutaneous nephrostomy placement. Separate culture was sent for each collecting system. Signed, Dulcy Fanny. Earleen Newport, DO [facility] Radiology Electronically Signed   By: Corrie Mckusick D.O.   On: 05/09/2017 14:35   Ir Nephrostomy Placement Right  Result Date: 05/09/2017 INDICATION: 83 year old male with acute renal failure and bilateral hydronephrosis. The patient has known left-sided nephrolithiasis, with retrograde pyelograms performed recently (05/07/2017) and bilateral ureteral stent placement. Despite stenting hydronephrosis has worsened with worsening renal failure. He has been referred for therapeutic bilateral percutaneous nephrostomy evaluation. EXAM: IR NEPHROSTOMY PLACEMENT LEFT IR NEPHROSTOMY PLACEMENT RIGHT COMPARISON:  None. MEDICATIONS: Ciprofloxacin 400 mg IV; The antibiotic was administered in an appropriate time frame prior to skin puncture. ANESTHESIA/SEDATION: Fentanyl 100 mcg IV; Versed 2.0 mg IV Moderate Sedation Time:  21 minutes The patient was continuously monitored during the procedure by the interventional radiology nurse under my direct supervision. CONTRAST:  15 cc - administered into the collecting system(s) FLUOROSCOPY TIME:  Fluoroscopy Time: 1 minutes 18 seconds (6 mGy). COMPLICATIONS: None PROCEDURE: Informed written consent was obtained from the patient after a thorough discussion of the procedural risks, benefits and alternatives. All questions were addressed. Maximal Sterile Barrier Technique was utilized including caps, mask, sterile gowns, sterile gloves, sterile drape, hand hygiene and skin antiseptic. A timeout was performed prior to the initiation of the procedure. Left: Patient positioned prone position on the fluoroscopy  table. Ultrasound survey of the left flank was performed with images stored and sent to PACs. The patient was then prepped and draped in the usual sterile fashion. 1% lidocaine was used to anesthetize the skin and subcutaneous tissues for local anesthesia. A Chiba needle was then used to access a posterior inferior calyx with ultrasound guidance. With spontaneous urine returned through the needle, passage of an 018 micro wire into the collecting system was performed under fluoroscopy. A small incision was made with an 11 blade scalpel, and the needle was removed from the wire. An Accustick system was then advanced over the wire into the collecting system under fluoroscopy. The metal stiffener and inner dilator were removed, and then a sample of fluid was aspirated through the 4 French outer sheath. Bentson wire was passed into the collecting system and the sheath removed. Ten French dilation of the soft tissues was performed. Using modified Seldinger technique, a 10 French pigtail catheter drain was placed over the Bentson wire. Wire and inner stiffener removed, and the pigtail was formed in the collecting system. Small amount of contrast confirmed position of the catheter. Right: Ultrasound survey of the right flank was then performed with images stored and sent to PACs. The patient was then prepped and draped in the usual sterile fashion. 1% lidocaine was used to anesthetize the skin and subcutaneous tissues  for local anesthesia. A Chiba needle was then used to access a posterior inferior calyx with ultrasound guidance. With spontaneous urine returned through the needle, passage of an 018 micro wire into the collecting system was performed under fluoroscopy. A small incision was made with an 11 blade scalpel, and the needle was removed from the wire. An Accustick system was then advanced over the wire into the collecting system under fluoroscopy. The metal stiffener and inner dilator were removed, and then a  sample of fluid was aspirated through the 4 French outer sheath. Bentson wire was passed into the collecting system and the sheath removed. Ten French dilation of the soft tissues was performed. Using modified Seldinger technique, a 10 French pigtail catheter drain was placed over the Bentson wire. Wire and inner stiffener removed, and the pigtail was formed in the collecting system. Small amount of contrast confirmed position of the catheter. Patient tolerated the procedure well and remained hemodynamically stable throughout. No complications were encountered and no significant blood loss encountered IMPRESSION: Status post bilateral percutaneous nephrostomy placement. Separate culture was sent for each collecting system. Signed, Dulcy Fanny. Earleen Newport, DO [facility] Radiology Electronically Signed   By: Corrie Mckusick D.O.   On: 05/09/2017 14:35       Discharge Exam: Vitals:   05/13/17 0604 05/13/17 0920  BP: (!) 157/80 (!) 158/86  Pulse: 88 83  Resp: 18 18  Temp: 97.9 F (36.6 C) 97.7 F (36.5 C)   Vitals:   05/12/17 1657 05/12/17 2039 05/13/17 0604 05/13/17 0920  BP: (!) 143/84 (!) 145/88 (!) 157/80 (!) 158/86  Pulse: 75 80 88 83  Resp: 18  18 18   Temp: 97.5 F (36.4 C) 98 F (36.7 C) 97.9 F (36.6 C) 97.7 F (36.5 C)  TempSrc: Oral Oral Oral Oral  SpO2: 99% 100% 95% 97%  Weight:  92.5 kg (204 lb)    Height:        General: Pt is alert, awake, not in acute distress Cardiovascular: RRR, S1/S2 +, no rubs, no gallops Respiratory: CTA bilaterally, no wheezing, no rhonchi Abdominal: Soft, NT, ND, bowel sounds + Extremities: no edema, no cyanosis GU: bilateral neph tubs in place, draining urine     The results of significant diagnostics from this hospitalization (including imaging, microbiology, ancillary and laboratory) are listed below for reference.     Microbiology: Recent Results (from the past 240 hour(s))  Urine Culture      Status: Abnormal   Collection Time: 05/06/17  4:43 PM  Result Value Ref Range Status   Specimen Description URINE, RANDOM  Final   Special Requests NONE  Final   Culture (A)  Final    50,000 COLONIES/mL STAPHYLOCOCCUS SPECIES (COAGULASE NEGATIVE)   Report Status 05/08/2017 FINAL  Final   Organism ID, Bacteria STAPHYLOCOCCUS SPECIES (COAGULASE NEGATIVE) (A)  Final      Susceptibility   Staphylococcus species (coagulase negative) - MIC*    CIPROFLOXACIN >=8 RESISTANT Resistant     GENTAMICIN <=0.5 SENSITIVE Sensitive     NITROFURANTOIN <=16 SENSITIVE Sensitive     OXACILLIN >=4 RESISTANT Resistant     TETRACYCLINE <=1 SENSITIVE Sensitive     VANCOMYCIN <=0.5 SENSITIVE Sensitive     TRIMETH/SULFA <=10 SENSITIVE Sensitive     CLINDAMYCIN >=8 RESISTANT Resistant     RIFAMPIN <=0.5 SENSITIVE Sensitive     Inducible Clindamycin NEGATIVE Sensitive     * 50,000 COLONIES/mL STAPHYLOCOCCUS SPECIES (COAGULASE NEGATIVE)  Culture, blood (routine x  2)     Status: None   Collection Time: 05/06/17  4:53 PM  Result Value Ref Range Status   Specimen Description BLOOD RIGHT FOREARM  Final   Special Requests   Final    BOTTLES DRAWN AEROBIC AND ANAEROBIC Blood Culture adequate volume   Culture NO GROWTH 5 DAYS  Final   Report Status 05/11/2017 FINAL  Final  Culture, blood (routine x 2)     Status: None   Collection Time: 05/06/17  5:10 PM  Result Value Ref Range Status   Specimen Description BLOOD LEFT ANTECUBITAL  Final   Special Requests   Final    BOTTLES DRAWN AEROBIC AND ANAEROBIC Blood Culture adequate volume   Culture NO GROWTH 5 DAYS  Final   Report Status 05/11/2017 FINAL  Final  Surgical pcr screen     Status: None   Collection Time: 05/07/17 10:31 AM  Result Value Ref Range Status   MRSA, PCR NEGATIVE NEGATIVE Final   Staphylococcus aureus NEGATIVE NEGATIVE Final    Comment:        The Xpert SA Assay (FDA approved for NASAL specimens in patients over 20 years of age), is one  component of a comprehensive surveillance program.  Test performance has been validated by [facility] for patients greater than or equal to 62 year old. It is not intended to diagnose infection nor to guide or monitor treatment.   Culture, Urine     Status: None   Collection Time: 05/09/17  2:16 PM  Result Value Ref Range Status   Specimen Description KIDNEY LEFT  Final   Special Requests NONE  Final   Culture NO GROWTH  Final   Report Status 05/10/2017 FINAL  Final  Culture, Urine     Status: None   Collection Time: 05/09/17  2:16 PM  Result Value Ref Range Status   Specimen Description KIDNEY RIGHT  Final   Special Requests NONE  Final   Culture NO GROWTH  Final   Report Status 05/10/2017 FINAL  Final     Labs: BNP (last 3 results) No results for input(s): BNP in the last 8760 hours. Basic Metabolic Panel:  Recent Labs Lab 05/09/17 1531 05/10/17 0510 05/11/17 0307 05/12/17 0421 05/13/17 0753  NA 141 142 146* 147* 141  K 5.2* 4.6 4.6 4.3 4.3  CL 113* 111 113* 112* 107  CO2 14* 18* 23 25 26   GLUCOSE 188* 133* 149* 166* 177*  BUN 99* 76* 58* 43* 34*  CREATININE 4.95* 3.23* 2.03* 1.57* 1.47*  CALCIUM 8.7* 8.7* 8.9 8.7* 8.8*  PHOS 5.6*  --  3.1 3.0 2.9   Liver Function Tests:  Recent Labs Lab 05/09/17 1531 05/11/17 0307 05/12/17 0421 05/13/17 0753  ALBUMIN 2.0* 2.1* 2.1* 2.2*   No results for input(s): LIPASE, AMYLASE in the last 168 hours. No results for input(s): AMMONIA in the last 168 hours. CBC:  Recent Labs Lab 05/09/17 0608 05/10/17 0510 05/11/17 0307 05/11/17 0959 05/12/17 0421 05/13/17 0753  WBC 13.4* 10.3 13.9*  --  16.1* 19.4*  HGB 11.3* 12.6* 12.8*  --  11.9* 12.0*  HCT 33.7* 38.1* 39.7 39.5 36.9* 37.8*  MCV 90.1 91.8 91.9  --  93.2 92.4  PLT 195 199 219  --  209 219   Cardiac Enzymes: No results for input(s): CKTOTAL, CKMB, CKMBINDEX, TROPONINI in the last 168 hours. BNP: Invalid input(s): POCBNP CBG:  Recent Labs Lab  05/12/17 1240 05/12/17 1654 05/12/17 2101 05/13/17 0801 05/13/17 1225  GLUCAP  194* 242* 200* 167* 201*   D-Dimer No results for input(s): DDIMER in the last 72 hours. Hgb A1c No results for input(s): HGBA1C in the last 72 hours. Lipid Profile No results for input(s): CHOL, HDL, LDLCALC, TRIG, CHOLHDL, LDLDIRECT in the last 72 hours. Thyroid function studies No results for input(s): TSH, T4TOTAL, T3FREE, THYROIDAB in the last 72 hours.  Invalid input(s): FREET3 Anemia work up  Recent Labs  05/11/17 0959  VITAMINB12 426  FERRITIN 581*  TIBC 154*  IRON 77   Urinalysis    Component Value Date/Time   COLORURINE AMBER (A) 05/12/2017 1116   COLORURINE BROWN (A) 05/12/2017 1116   APPEARANCEUR CLOUDY (A) 05/12/2017 1116   APPEARANCEUR TURBID (A) 05/12/2017 1116   LABSPEC 1.011 05/12/2017 1116   LABSPEC 1.020 05/12/2017 1116   PHURINE 6.0 05/12/2017 1116   PHURINE 6.5 05/12/2017 1116   GLUCOSEU 50 (A) 05/12/2017 1116   GLUCOSEU NEGATIVE 05/12/2017 1116   HGBUR LARGE (A) 05/12/2017 1116   HGBUR LARGE (A) 05/12/2017 1116   BILIRUBINUR NEGATIVE 05/12/2017 1116   BILIRUBINUR MODERATE (A) 05/12/2017 1116   KETONESUR NEGATIVE 05/12/2017 1116   KETONESUR 15 (A) 05/12/2017 1116   PROTEINUR 100 (A) 05/12/2017 1116   PROTEINUR >300 (A) 05/12/2017 1116   UROBILINOGEN 1.0 04/29/2014 1033   NITRITE NEGATIVE 05/12/2017 1116   NITRITE POSITIVE (A) 05/12/2017 1116   LEUKOCYTESUR SMALL (A) 05/12/2017 1116   LEUKOCYTESUR MODERATE (A) 05/12/2017 1116   Sepsis Labs Invalid input(s): PROCALCITONIN,  WBC,  LACTICIDVEN Microbiology Recent Results (from the past 240 hour(s))  Urine Culture     Status: Abnormal   Collection Time: 05/06/17  4:43 PM  Result Value Ref Range Status   Specimen Description URINE, RANDOM  Final   Special Requests NONE  Final   Culture (A)  Final    50,000 COLONIES/mL STAPHYLOCOCCUS SPECIES (COAGULASE NEGATIVE)   Report Status 05/08/2017 FINAL  Final    Organism ID, Bacteria STAPHYLOCOCCUS SPECIES (COAGULASE NEGATIVE) (A)  Final      Susceptibility   Staphylococcus species (coagulase negative) - MIC*    CIPROFLOXACIN >=8 RESISTANT Resistant     GENTAMICIN <=0.5 SENSITIVE Sensitive     NITROFURANTOIN <=16 SENSITIVE Sensitive     OXACILLIN >=4 RESISTANT Resistant     TETRACYCLINE <=1 SENSITIVE Sensitive     VANCOMYCIN <=0.5 SENSITIVE Sensitive     TRIMETH/SULFA <=10 SENSITIVE Sensitive     CLINDAMYCIN >=8 RESISTANT Resistant     RIFAMPIN <=0.5 SENSITIVE Sensitive     Inducible Clindamycin NEGATIVE Sensitive     * 50,000 COLONIES/mL STAPHYLOCOCCUS SPECIES (COAGULASE NEGATIVE)  Culture, blood (routine x 2)     Status: None   Collection Time: 05/06/17  4:53 PM  Result Value Ref Range Status   Specimen Description BLOOD RIGHT FOREARM  Final   Special Requests   Final    BOTTLES DRAWN AEROBIC AND ANAEROBIC Blood Culture adequate volume   Culture NO GROWTH 5 DAYS  Final   Report Status 05/11/2017 FINAL  Final  Culture, blood (routine x 2)     Status: None   Collection Time: 05/06/17  5:10 PM  Result Value Ref Range Status   Specimen Description BLOOD LEFT ANTECUBITAL  Final   Special Requests   Final    BOTTLES DRAWN AEROBIC AND ANAEROBIC Blood Culture adequate volume   Culture NO GROWTH 5 DAYS  Final   Report Status 05/11/2017 FINAL  Final  Surgical pcr screen     Status: None  Collection Time: 05/07/17 10:31 AM  Result Value Ref Range Status   MRSA, PCR NEGATIVE NEGATIVE Final   Staphylococcus aureus NEGATIVE NEGATIVE Final    Comment:        The Xpert SA Assay (FDA approved for NASAL specimens in patients over 53 years of age), is one component of a comprehensive surveillance program.  Test performance has been validated by [facility] for patients greater than or equal to 57 year old. It is not intended to diagnose infection nor to guide or monitor treatment.   Culture, Urine     Status: None   Collection Time: 05/09/17   2:16 PM  Result Value Ref Range Status   Specimen Description KIDNEY LEFT  Final   Special Requests NONE  Final   Culture NO GROWTH  Final   Report Status 05/10/2017 FINAL  Final  Culture, Urine     Status: None   Collection Time: 05/09/17  2:16 PM  Result Value Ref Range Status   Specimen Description KIDNEY RIGHT  Final   Special Requests NONE  Final   Culture NO GROWTH  Final   Report Status 05/10/2017 FINAL  Final     Time coordinating discharge: 45 minutes  SIGNED:  Dessa Phi, DO [facility] Pager [phone]  If 7PM-7AM, please contact night-coverage [email or web address] Password [email or web address] 05/13/2017, 1:24 PM

## 2017-05-13 NOTE — Progress Notes (Signed)
Rehab admissions - I received an update from patient's daughter this am.  Just to clarify, we can accept patients on IV antibiotics.  Please call me if I can assist in some way.  #[phone]

## 2017-05-13 NOTE — Progress Notes (Signed)
Post Admission Physician Evaluation: 1. Functional deficits secondary  to debility. 2. Patient is admitted to receive collaborative, interdisciplinary care between the physiatrist, rehab nursing staff, and therapy team. 3. Patient's level of medical complexity and substantial therapy needs in context of that medical necessity cannot be provided at a lesser intensity of care such as a SNF. 4. Patient has experienced substantial functional loss from his/her baseline which was documented above under the "Functional History" and "Functional Status" headings.  Judging by the patient's diagnosis, physical exam, and functional history, the patient has potential for functional progress which will result in measurable gains while on inpatient rehab.  These gains will be of substantial and practical use upon discharge  in facilitating mobility and self-care at the household level. 5. Physiatrist will provide 24 hour management of medical needs as well as oversight of the therapy plan/treatment and provide guidance as appropriate regarding the interaction of the two. 6. The Preadmission Screening has been reviewed and patient status is unchanged unless otherwise stated above. 7. 24 hour rehab nursing will assist with bladder management, bowel management, safety, skin/wound care, disease management, medication administration, pain management and patient education  and help integrate therapy concepts, techniques,education, etc. 8. PT will assess and treat for/with: Lower extremity strength, range of motion, stamina, balance, functional mobility, safety, adaptive techniques and equipment, activity tolerance, family ed.   Goals are: mod I. 9. OT will assess and treat for/with: ADL's, functional mobility, safety, upper extremity strength, adaptive techniques and equipment, activity tolerance, family ed.   Goals are: mod I. Therapy may proceed with showering this patient. 10. SLP will assess and treat for/with: n/a.  Goals  are: n/a. 11. Case Management and Social Worker will assess and treat for psychological issues and discharge planning. 12. Team conference will be held weekly to assess progress toward goals and to determine barriers to discharge. 13. Patient will receive at least 3 hours of therapy per day at least 5 days per week. 14. ELOS: 7-10 days       15. Prognosis:  excellent

## 2017-05-13 NOTE — Progress Notes (Signed)
S: Feels well and stronger O: BP (!) 158/86 (BP Location: Left Arm)   Pulse 83   Temp 97.7 F (36.5 C) (Oral)   Resp 18   Ht 6\' 2"  (1.88 m)   Wt 204 lb (92.5 kg)   SpO2 97%   BMI 26.19 kg/m   NAD- chest, cta cv- reg rate  A/p See CIR note Pt's wbc is increasing Results for JSON, William [name] (MRN [id number]) as of 05/13/2017 10:26  Ref. Range 05/08/2017 04:42 05/09/2017 06:08 05/10/2017 05:10 05/11/2017 03:07 05/12/2017 04:21 05/13/2017 07:53  WBC Latest Ref Range: 4.0 - 10.5 K/uL 11.9 (H) 13.4 (H) 10.3 13.9 (H) 16.1 (H) 19.4 (H)   He, however, appears well, strength is improving. If hospitalist service feels he is stable for discharge (minimal risk for progressive infection/sepsis), it is ok to d/c and readmit to inpatietn rehab.

## 2017-05-13 NOTE — Progress Notes (Signed)
Subjective: Interval History: has no complaint , drinking more water. .  Objective: Vital signs in last 24 hours: Temp:  [97.5 F (36.4 C)-98 F (36.7 C)] 97.9 F (36.6 C) (06/16 0604) Pulse Rate:  [75-88] 88 (06/16 0604) Resp:  [18] 18 (06/16 0604) BP: (143-157)/(80-88) 157/80 (06/16 0604) SpO2:  [95 %-100 %] 95 % (06/16 0604) Weight:  [92.5 kg (204 lb)] 92.5 kg (204 lb) (06/15 2039) Weight change: -1.814 kg (-4 lb)  Intake/Output from previous day: 06/15 0701 - 06/16 0700 In: 720 [P.O.:720] Out: 1210 [Drains:1210] Intake/Output this shift: No intake/output data recorded.  General appearance: alert, cooperative and no distress Resp: clear to auscultation bilaterally Cardio: irregularly irregular rhythm and systolic murmur: systolic ejection 2/6, decrescendo at 2nd left intercostal space GI: soft, non-tender; bowel sounds normal; no masses,  no organomegaly and bilat PCNx Extremities: extremities normal, atraumatic, no cyanosis or edema  Lab Results:  Recent Labs  05/12/17 0421 05/13/17 0753  WBC 16.1* 19.4*  HGB 11.9* 12.0*  HCT 36.9* 37.8*  PLT 209 219   BMET:  Recent Labs  05/12/17 0421 05/13/17 0753  NA 147* 141  K 4.3 4.3  CL 112* 107  CO2 25 26  GLUCOSE 166* 177*  BUN 43* 34*  CREATININE 1.57* 1.47*  CALCIUM 8.7* 8.8*   No results for input(s): PTH in the last 72 hours. Iron Studies:  Recent Labs  05/11/17 0959  IRON 77  TIBC 154*  FERRITIN 581*    Studies/Results: No results found.  I have reviewed the patient's current medications.  Assessment/Plan: 1 AKI resolving.  Not clear what baseline will be.  SNa now better with ^ free water intake. 2 ^WBC suspect urine , culture 3 afib 4 CVA 5 DM controlled P Culture, avoid ACEI, follow chem . Will s/o and can see as outpatient but primary f/u is Urology    LOS: 7 days   Zachary Nole L 05/13/2017,9:25 AM

## 2017-05-13 NOTE — Progress Notes (Signed)
[name] to be D/C'd Rehab per MD order.  Discussed prescriptions and follow up appointments with the patient. Prescriptions given to patient, medication list explained in detail. Pt verbalized understanding.    There were no vitals filed for this visit.  Skin clean, dry and intact without evidence of skin break down, no evidence of skin tears noted. IV catheter discontinued intact. Site without signs and symptoms of complications. Dressing and pressure applied. Pt denies pain at this time. No complaints noted.   Patient escorted via [facility] to Rehab by Charge Nurse Haywood Lasso BSN, RN [facility] Phone [phone]

## 2017-05-13 NOTE — Progress Notes (Signed)
05/13/17 1350 nursing Admitted to rehab per wheelchair accompanied by RN ,NT and daughter  with nephrostomy tube intact alert and oriented patient. Oriented to unit set up; fall precaution explained and signed by patient. MD notified of admission new orders noted.

## 2017-05-14 ENCOUNTER — Inpatient Hospital Stay (HOSPITAL_COMMUNITY): Payer: Medicare Other

## 2017-05-14 ENCOUNTER — Inpatient Hospital Stay (HOSPITAL_COMMUNITY): Payer: Medicare Other | Admitting: Physical Therapy

## 2017-05-14 DIAGNOSIS — N179 Acute kidney failure, unspecified: Secondary | ICD-10-CM

## 2017-05-14 DIAGNOSIS — R5381 Other malaise: Principal | ICD-10-CM

## 2017-05-14 DIAGNOSIS — Z794 Long term (current) use of insulin: Secondary | ICD-10-CM

## 2017-05-14 DIAGNOSIS — D7289 Other specified disorders of white blood cells: Secondary | ICD-10-CM

## 2017-05-14 DIAGNOSIS — I4891 Unspecified atrial fibrillation: Secondary | ICD-10-CM

## 2017-05-14 DIAGNOSIS — E119 Type 2 diabetes mellitus without complications: Secondary | ICD-10-CM

## 2017-05-14 LAB — GLUCOSE, CAPILLARY
GLUCOSE-CAPILLARY: 172 mg/dL — AB (ref 65–99)
GLUCOSE-CAPILLARY: 174 mg/dL — AB (ref 65–99)
Glucose-Capillary: 179 mg/dL — ABNORMAL HIGH (ref 65–99)
Glucose-Capillary: 154 mg/dL — ABNORMAL HIGH (ref 65–99)

## 2017-05-14 NOTE — H&P (Signed)
Physical Medicine and Rehabilitation Admission H&P    No chief complaint on file. : William J [name] a 83 y.o.right handed malewith history of Diabetes mellitus, hypertension, prostate cancer with radiation therapy, CVA November 2017 and did not receive inpatient rehabilitation services discharged to home at supervision level, A. fib maintained on Eliquis, right TKA 2015. Per chart review and familypatient lives with spouse, who can only provide supervision at discharge. Independent driving prior to admission. One level home.Wife limited physical assistance.Presented 05/06/2017 with generalized weakness/lethargy, weight loss and recurrent falls over the past 2 weeks as well as reported urinary frequency and nocturia. Creatinine elevated 4.95 from baseline 1.01 BUN 43. Recent CT revealed left hydronephrosis with dilation of the ureter down to the pelvic brim ofthe iliac and then the ureter appeared to taper to a normal caliber. Ultrasound follow-up revealed moderate left hydronephrosis. Urology consulted and underwent cystoscopy with bilateral retrograde myelogram with findings of ureteral strictures and underwent stent placement 05/09/2017 per Dr Junious Silk as well as bilateral nephrostomy tubes per interventional radiology 05/10/2017. Nephrology consulted for AKIfelt to be mostly obstructive and continue to monitor his creatinine 1.57. Chronic Eliquisresumed 05/11/2017.Coag negative staph UTI and completing course of doxycycline and monitoring white blood cell count 16,000 as patient remains afebrile. Patient with intermittent bouts of restlessness agitation requiring Ativan. Therapy evaluations ongoing. M.D. has requested physical medicine rehabilitation consult.Patient was admitted for a comprehensive rehabilitation program  ROS Constitutional: Positive for malaise/fatigue and weight loss. Negative for chills and fever.  HENT: Positive for hearing loss.   Eyes: Negative for blurred vision  and double vision.  Respiratory: Positive for shortness of breath.   Cardiovascular: Positive for palpitations and leg swelling. Negative for chest pain.  Gastrointestinal: Positive for constipation. Negative for nausea and vomiting.  Genitourinary: Positive for frequency and urgency.  Musculoskeletal: Positive for falls and myalgias.  Neurological: Positive for weakness. Negative for seizures.  All other systems reviewed and are negative Past Medical History:  Diagnosis Date  . Arthritis   . Diabetes mellitus without complication ([place])   . Hypercholesteremia   . Hypertension   . Kidney stones   . Prostate cancer [facility])    with radiation treatment  . Sleep apnea 2/14   "mild per patient" hasnt gotten CPAP machine  . Stroke [facility])    Past Surgical History:  Procedure Laterality Date  . ADENOIDECTOMY    . CYSTOSCOPY W/ URETERAL STENT PLACEMENT Bilateral 05/07/2017   Procedure: CYSTOSCOPY WITH BILATERAL RETROGRADE PYELOGRAM CYSTOGRAM BILATERAL URETERAL STENT PLACEMENT;  Surgeon: Festus Aloe, MD;  Location: [place];  Service: Urology;  Laterality: Bilateral;  . FLEXIBLE SIGMOIDOSCOPY N/A 09/12/2013   Procedure: FLEXIBLE SIGMOIDOSCOPY;  Surgeon: Milus Banister, MD;  [facility];  Service: Endoscopy;  Laterality: N/A;  . HERNIA REPAIR    . HOT HEMOSTASIS N/A 09/12/2013   Procedure: HOT HEMOSTASIS (ARGON PLASMA COAGULATION/BICAP);  Surgeon: Milus Banister, MD;  Location: [facility];  Service: Endoscopy;  Laterality: N/A;  . IR NEPHROSTOMY PLACEMENT LEFT  05/09/2017  . IR NEPHROSTOMY PLACEMENT RIGHT  05/09/2017  . LIPOMA EXCISION    . OTHER SURGICAL HISTORY     s/p prostate radiation  . TONSILLECTOMY    . TOTAL KNEE ARTHROPLASTY Left 03/25/2014   Procedure: LEFT TOTAL KNEE ARTHROPLASTY;  Surgeon: [name], MD;  [facility];  Service: Orthopedics;  Laterality: Left;  . TOTAL KNEE ARTHROPLASTY Right 05/06/2014   Procedure: RIGHT TOTAL KNEE ARTHROPLASTY;  Surgeon:  [name], MD;  [facility];  Service: Orthopedics;  Laterality: Right;   Family History  Problem Relation Age of Onset  . Colon cancer Father   . Stroke Maternal Grandmother   . Esophageal cancer Neg Hx   . Rectal cancer Neg Hx   . Stomach cancer Neg Hx    Social History:  reports that he has quit smoking. He started smoking about 34 years ago. He has never used smokeless tobacco. He reports that he drinks about 0.6 oz of alcohol per week . He reports that he does not use drugs. Allergies:  Allergies  Allergen Reactions  . Glyburide Other (See Comments)    Hypoglycemia    Medications Prior to Admission  Medication Sig Dispense Refill  . acetaminophen (TYLENOL) 325 MG tablet Take 325-650 mg by mouth every 6 (six) hours as needed for mild pain.    Marland Kitchen amLODipine (NORVASC) 5 MG tablet Take 1 tablet (5 mg total) by mouth at bedtime. 30 tablet 0  . apixaban (ELIQUIS) 5 MG TABS tablet Take 1 tablet (5 mg total) by mouth 2 (two) times daily. 30 tablet 0  . cefTRIAXone 1 g in dextrose 5 % 50 mL Inject 1 g into the vein daily.    . Coenzyme Q10 (CO Q-10) 300 MG CAPS Take 300 mg by mouth daily with breakfast.    . feeding supplement, ENSURE ENLIVE, (ENSURE ENLIVE) LIQD Take 237 mLs by mouth 2 (two) times daily between meals. 237 mL 12  . Glucosamine-Chondroit-Vit C-Mn (GLUCOSAMINE-CHONDROITIN) TABS Take 1 tablet by mouth 2 (two) times daily.    Marland Kitchen HUMALOG KWIKPEN 100 UNIT/ML KiwkPen Inject 6 Units into the skin 3 (three) times daily before meals.     Marland Kitchen LEVEMIR FLEXTOUCH 100 UNIT/ML Pen Inject 16 Units into the skin at bedtime.     Marland Kitchen loperamide (IMODIUM A-D) 2 MG tablet Take 2 mg by mouth 4 (four) times daily as needed for diarrhea or loose stools.    . pravastatin (PRAVACHOL) 40 MG tablet Take 40 mg by mouth daily with supper.     . traZODone (DESYREL) 50 MG tablet Take 1 tablet (50 mg total) by mouth at bedtime. 10 tablet 0    Home: Home Living Family/patient expects to be  discharged to:: Private residence   Functional History:    Functional Status:  Mobility:          ADL:    Cognition: Cognition Orientation Level: (P) Oriented to person, Oriented to situation, Disoriented to time, Disoriented to place    Physical Exam: Blood pressure (!) 156/71, pulse 80, temperature 98.2 F (36.8 C), temperature source Oral, resp. rate 16, height 6' 1.5" (1.867 m), weight 205 lb 4.8 oz (93.1 kg), SpO2 97 %. Physical Exam Vitals reviewed. Constitutional: He appears well-developed.  HENT:  Head: Normocephalic.  Eyes: EOM are normal.  Neck: Normal range of motion. Neck supple. No thyromegaly present.  Cardiovascular:  Cardiac rate controlled  Respiratory: Effort normal and breath sounds normal. No respiratory distress.  GI: Soft. Bowel sounds are normal. He exhibits no distension.   Skin. Warm and dry Neurological: He is alert.  Provider name and age.  He does show some delay in processing.  Follows simple commands HOH Motor: Grossly 5/5 throughout Sensation intact to light touch  BHS exam on 6/16  well-developed well-nourished male in no acute distress. HEENT exam atraumatic, normocephalic, neck supple without jugular venous distention. Chest clear to auscultation cardiac exam S1-S2 are regular. Abdominal exam overweight with bowel sounds, soft  and nontender. Nephrostomy tubes in place. Extremities no edema. Neurologic exam is alert   Results for orders placed or performed during the hospital encounter of 05/13/17 (from the past 48 hour(s))  Glucose, capillary     Status: Abnormal   Collection Time: 05/13/17  4:44 PM  Result Value Ref Range   Glucose-Capillary 177 (H) 65 - 99 mg/dL  Glucose, capillary     Status: Abnormal   Collection Time: 05/13/17  9:17 PM  Result Value Ref Range   Glucose-Capillary 181 (H) 65 - 99 mg/dL   Comment 1 Notify RN   Glucose, capillary     Status: Abnormal   Collection Time: 05/14/17  6:40 AM  Result Value Ref  Range   Glucose-Capillary 179 (H) 65 - 99 mg/dL   Comment 1 Notify RN    No results found.     Medical Problem List and Plan: 1.   Debilitation secondary to acute renal failure with bilateral hydronephrosis status post stenting 05/09/2017 as well as bilateral nephrostomy tubes 05/10/2017 2.  DVT Prophylaxis/Anticoagulation:Chronic Eliquis 3. Pain Management: Tylenol 4. Mood/delirium: Trazodone 50 mg daily at bedtime, Ativan 1 mg as needed anxiety/sleep 5. Neuropsych: This patient is capable of making decisions on his own behalf. 6. Skin/Wound Care: Routine skin checks 7. Fluids/Electrolytes/Nutrition: Routine I&O with follow-up chemistries 8. AKI/hypernatremia felt to be secondary to obstruction. Follow-up renal services. 9. Hypertension. Norvasc 5 mg daily at bedtime 10. Atrial fibrillation.Eliquis resumed. Cardiac rate control 11. Coag-negative staph UTI. Blood cultures negative to date. Complete course of doxycycline Note increasing WBC- he does not appear to have a source of infection. Repeat urine culture pending. hospitalist changing to iv rocephin- will continue in hospital 12. Type 2 diabetes mellitus. Hemoglobin A1c 6.8. Sliding scale insulin. Patient on Levemir 16 units daily at bedtime prior to admission 13. History of prostate cancer and radiation therapy 14. Hyperlipidemia. Pravachol  I reviewed the chart, discussed the hx with patient and dtr on 6/16. Ok to transfer to CIR.     05/14/2017

## 2017-05-14 NOTE — Progress Notes (Signed)
Nephrostomy tubes flushed with 10 cc's normal saline.

## 2017-05-14 NOTE — Evaluation (Signed)
Occupational Therapy Assessment and Plan  Patient Details  Name: William [name] MRN: [id number] Date of Birth: June 19, 1934  OT Diagnosis: abnormal posture and muscle weakness (generalized) Rehab Potential:   ELOS: 7-10 days   Today's Date: 05/14/2017 OT Individual Time:  -        Problem List:  Patient Active Problem List   Diagnosis Date Noted  . Debilitated 05/13/2017  . Protein-calorie malnutrition, severe 05/10/2017  . History of prostate cancer   . Hydronephrosis, bilateral   . AKI (acute kidney injury) ([facility])   . Diabetes mellitus type 2 in nonobese (H[facility])   . Atrial fibrillation ([facility])   . Benign essential HTN   . Leukocytosis   . Acute blood loss anemia   . Hydronephrosis   . Acute renal failure (ARF) (HCC) 05/06/2017  . Acute lower UTI 05/06/2017  . Occult GI bleeding 05/06/2017  . Stroke (HCC) 10/26/2016  . Obese 05/07/2014  . Expected blood loss anemia 05/07/2014  . S/P right TKA 05/06/2014  . DJD (degenerative joint disease) of knee 03/25/2014  . Preop cardiovascular exam 03/19/2014  . Abnormal ECG 03/19/2014  . Hypertension   . IDDM (insulin dependent diabetes mellitus) (H[facility])   . Hypercholesteremia   . Arthritis   . Prostate cancer (H[facility])   . Sleep apnea     Past Medical History:  Past Medical History:  Diagnosis Date  . Arthritis   . Diabetes mellitus without complication ([facility])   . Hypercholesteremia   . Hypertension   . Kidney stones   . Prostate cancer [facility])    with radiation treatment  . Sleep apnea 2/14   "mild per patient" hasnt gotten CPAP machine  . Stroke [facility])    Past Surgical History:  Past Surgical History:  Procedure Laterality Date  . ADENOIDECTOMY    . CYSTOSCOPY W/ URETERAL STENT PLACEMENT Bilateral 05/07/2017   Procedure: CYSTOSCOPY WITH BILATERAL RETROGRADE PYELOGRAM CYSTOGRAM BILATERAL URETERAL STENT PLACEMENT;  Surgeon: Jerilee Field, MD;  Location: [facility] OR;  Service: Urology;  Laterality: Bilateral;  . FLEXIBLE SIGMOIDOSCOPY  N/A 09/12/2013   Procedure: FLEXIBLE SIGMOIDOSCOPY;  Surgeon: Rachael Fee, MD;  [facility];  Service: Endoscopy;  Laterality: N/A;  . HERNIA REPAIR    . HOT HEMOSTASIS N/A 09/12/2013   Procedure: HOT HEMOSTASIS (ARGON PLASMA COAGULATION/BICAP);  Surgeon: Rachael Fee, MD;  Location: [facility];  Service: Endoscopy;  Laterality: N/A;  . IR NEPHROSTOMY PLACEMENT LEFT  05/09/2017  . IR NEPHROSTOMY PLACEMENT RIGHT  05/09/2017  . LIPOMA EXCISION    . OTHER SURGICAL HISTORY     s/p prostate radiation  . TONSILLECTOMY    . TOTAL KNEE ARTHROPLASTY Left 03/25/2014   Procedure: LEFT TOTAL KNEE ARTHROPLASTY;  Surgeon: Shelda Pal, MD;  [facility];  Service: Orthopedics;  Laterality: Left;  . TOTAL KNEE ARTHROPLASTY Right 05/06/2014   Procedure: RIGHT TOTAL KNEE ARTHROPLASTY;  Surgeon: Shelda Pal, MD;  [facility];  Service: Orthopedics;  Laterality: Right;    Assessment & Plan Clinical Impression: William Highfill [name] a 83 y.o.right handed malewith history of Diabetes mellitus, hypertension, prostate cancer with radiation therapy, CVA November 2017and did not receive inpatient rehabilitation services discharged to home at supervision level, A. fib maintained on Eliquis, right TKA 2015. Per chart review and familypatient lives with spouse, who can only provide supervision at discharge. Independent driving prior to admission. One level home.Wife limited physical assistance.Presented 05/06/2017 with generalized weakness/lethargy, weight loss and recurrent falls over the past 2 weeks  as well as reported urinary frequency and nocturia. Creatinine elevated 4.53fom baseline 1.01 BUN 43. Recent CT revealed left hydronephrosis with dilation of the ureter down to the pelvic brim ofthe iliac and then the ureter appeared to taper to a normal caliber. Ultrasound follow-up revealed moderate left hydronephrosis. Urology consulted and underwent cystoscopy with bilateral retrograde  myelogram with findings of ureteral strictures and underwent stent placement 05/09/2017 per Dr EJunious Silkas well as bilateral nephrostomy tubes per interventional radiology 05/10/2017. Nephrology consulted for AKIfelt to be mostly obstructive and continue to monitorhis creatinine 1.57. Chronic Eliquisresumed 05/11/2017.Coag negative staph UTI and completing course of doxycycline and monitoring white blood cell count 16,000 as patient remains afebrile.Patient with intermittent bouts of restlessness agitation requiring Ativan.Therapy evaluations ongoing. M.D. has requested physical medicine rehabilitation consult.Patient was admitted for a comprehensive rehabilitation program .    Patient currently requires min-mod with basic self-care skills and functional mobility secondary to muscle weakness, decreased cardiorespiratoy endurance and decreased safety awareness.  Prior to hospitalization, patient could complete ADLs with modified independent .  Patient will benefit from skilled intervention to increase independence with basic self-care skills prior to discharge home with care partner.  Anticipate patient will require intermittent supervision and follow up home health.  OT - End of Session Endurance Deficit: Yes OT Assessment OT Patient demonstrates impairments in the following area(s): Balance;Endurance;Motor;Nutrition;Safety OT Basic ADL's Functional Problem(s): Grooming;Bathing;Dressing;Toileting OT Advanced ADL's Functional Problem(s): Simple Meal Preparation OT Transfers Functional Problem(s): Toilet;Tub/Shower OT Plan OT Intensity: Minimum of 1-2 x/day, 45 to 90 minutes OT Frequency: 5 out of 7 days OT Duration/Estimated Length of Stay: 7-10 OT Treatment/Interventions: Balance/vestibular training;Community reintegration;Discharge planning;DME/adaptive equipment instruction;Functional mobility training;Patient/family education;Psychosocial support;Self Care/advanced ADL retraining;Therapeutic  Activities;UE/LE Coordination activities;Therapeutic Exercise;UE/LE Strength taining/ROM OT Self Feeding Anticipated Outcome(s): MOD I OT Basic Self-Care Anticipated Outcome(s): MOD I OT Toileting Anticipated Outcome(s): MOD I toilet; S shower OT Bathroom Transfers Anticipated Outcome(s): MOD I toilet; S shower OT Recommendation Recommendations for Other Services: Speech consult Patient destination: Home Follow Up Recommendations: Home health OT Equipment Recommended: To be determined Equipment Details: has shower seat and raised toilet   Skilled Therapeutic Intervention 1:1. Pt/family educated on OT role/purpose, ELOS, CIR, and POC. Pt supine>sitting EOB with MIN A for trunk facilitation and VC to push up through forarm/wrist. Pt stand pivot transfer with MOD A EOB>w/c<>shower seated for lifting and VC for safety awareness, RW management and posture. With B drains and IV covered, Pt bathes at sit to stand level with MOD A for lifting and balance as pt washes wash buttocks and peri area. Pt requires VC to wash RUE. OT dons non slip socks with total A. Pt able to thread BLE into pants with increased time and VC to breathe while reaching to feet. Pt dresses at sit to stand level at sink with MOD A for lifting and balance for pt to advance pants over hips with VC for weight shifting forward and using sink to stabilize. Exited session with Pt left seated in w/c at sink to complete grooming with supervision/set up with family present. Educated pt and family that any mobility should be done with A of CIR staff. NT aware of position.   OT Evaluation Precautions/Restrictions  Precautions Precautions: Fall Restrictions Weight Bearing Restrictions: No General Chart Reviewed: Yes Vital Signs  Pain Pain Assessment Pain Assessment: No/denies pain Home Living/Prior Functioning Home Living Family/patient expects to be discharged to:: Private residence Available Help at Discharge: Family, Available  24 hours/day [place]  Access: Stairs to enter [employer] of Steps: 1 Entrance Stairs-Rails: None Home Layout: One level Bathroom Shower/Tub: Tub/shower unit, Gaffer, Architectural technologist: Handicapped height  Lives With: Significant other IADL History Homemaking Responsibilities: Yes Meal Prep Responsibility: Primary Laundry Responsibility: Secondary Cleaning Responsibility: Secondary Bill Paying/Finance Responsibility: Secondary Shopping Responsibility: Secondary Current License: Yes Mode of Transportation: Car Occupation: Retired Type of Occupation: Engineer, petroleum Leisure and Hobbies: Film/video editor Prior Function Level of Independence: Independent with basic ADLs  Able to [place]?: Yes Driving: Yes Comments: drives, takes Proofreader, has been [place] for a few years, loves to travel ADL   Vision Baseline Vision/History: Wears glasses Wears Glasses: Reading only Patient Visual Report: No change from baseline Vision Assessment?: Yes Eye Alignment: Within Functional Limits Ocular Range of Motion: Within Functional Limits Tracking/Visual Pursuits: Able to track stimulus in all quads without difficulty Perception  Perception: Impaired (required cueing to wash/undress R arm (possible residual R inattention from previous stroke)) Praxis Praxis: Intact Cognition Arousal/Alertness: Awake/alert Orientation Level: Person Year: 2018 Month: June Day of Week: Correct Immediate Memory Recall: Sock;Blue;Bed Memory Recall: Blue;Sock Memory Recall Blue: Without Cue Sensation Sensation Light Touch: Appears Intact Motor  Motor Motor:  (generalized weakness RUE>LUE from previous stroke) Mobility  Transfers Transfers: Sit to Stand Sit to Stand: 3: Mod assist  Trunk/Postural Assessment  Cervical Assessment Cervical Assessment: Exceptions to [facility] (head forward) Thoracic Assessment Thoracic Assessment:  Exceptions to [facility] (rounded shoulders) Lumbar Assessment Lumbar Assessment: Exceptions to [facility] (posterior pelvic tilt)  Balance Balance Balance Assessed: Yes Dynamic Sitting Balance Dynamic Sitting - Balance Support: Right upper extremity supported Dynamic Sitting - Level of Assistance: 5: Stand by assistance Sitting balance - Comments: bathing feet Dynamic Standing Balance Dynamic Standing - Balance Support: Right upper extremity supported Dynamic Standing - Level of Assistance: 3: Mod assist Dynamic Standing - Comments: advancing pants past hips Extremity/Trunk Assessment RUE Assessment RUE Assessment: Exceptions to [facility] (generalized weakness) LUE Assessment LUE Assessment: Exceptions to [facility] (generalized weakness)   See Function Navigator for Current Functional Status.   Refer to Care Plan for Long Term Goals  Recommendations for other services: Therapeutic Recreation  Kitchen group and Outing/community reintegration   Discharge Criteria: Patient will be discharged from OT if patient refuses treatment 3 consecutive times without medical reason, if treatment goals not met, if there is a change in medical status, if patient makes no progress towards goals or if patient is discharged from hospital.  The above assessment, treatment plan, treatment alternatives and goals were discussed and mutually agreed upon: by patient and by family  Tonny Branch 05/14/2017, 9:55 AM

## 2017-05-14 NOTE — Evaluation (Signed)
Physical Therapy Assessment and Plan  Patient Details  Name: William [name] MRN: [id number] Date of Birth: [birth year]  PT Diagnosis: Difficulty walking, Impaired cognition and Muscle weakness Rehab Potential: Good ELOS: 7 to 10 days   Today's Date: 05/14/2017 PT Individual Time: 1100-1200 PT Individual Time Calculation (min): 60 min    Problem List:  Patient Active Problem List   Diagnosis Date Noted  . Debilitated 05/13/2017  . Protein-calorie malnutrition, severe 05/10/2017  . History of prostate cancer   . Hydronephrosis, bilateral   . AKI (acute kidney injury) ([place])   . Diabetes mellitus type 2 in nonobese (H[facility])   . Atrial fibrillation ([name])   . Benign essential HTN   . Leukocytosis   . Acute blood loss anemia   . Hydronephrosis   . Acute renal failure (ARF) ([place]) 05/06/2017  . Acute lower UTI 05/06/2017  . Occult GI bleeding 05/06/2017  . Stroke ([place]) 10/26/2016  . Obese 05/07/2014  . Expected blood loss anemia 05/07/2014  . S/P right TKA 05/06/2014  . DJD (degenerative joint disease) of knee 03/25/2014  . Preop cardiovascular exam 03/19/2014  . Abnormal ECG 03/19/2014  . Hypertension   . IDDM (insulin dependent diabetes mellitus) ([place])   . Hypercholesteremia   . Arthritis   . Prostate cancer ([name])   . Sleep apnea     Past Medical History:  Past Medical History:  Diagnosis Date  . Arthritis   . Diabetes mellitus without complication ([name])   . Hypercholesteremia   . Hypertension   . Kidney stones   . Prostate cancer [facility])    with radiation treatment  . Sleep apnea 2/14   "mild per patient" hasnt gotten CPAP machine  . Stroke [facility])    Past Surgical History:  Past Surgical History:  Procedure Laterality Date  . ADENOIDECTOMY    . CYSTOSCOPY W/ URETERAL STENT PLACEMENT Bilateral 05/07/2017   Procedure: CYSTOSCOPY WITH BILATERAL RETROGRADE PYELOGRAM CYSTOGRAM BILATERAL URETERAL STENT PLACEMENT;  Surgeon: Festus Aloe, MD;  Location: [place];  Service:  Urology;  Laterality: Bilateral;  . FLEXIBLE SIGMOIDOSCOPY N/A 09/12/2013   Procedure: FLEXIBLE SIGMOIDOSCOPY;  Surgeon: Milus Banister, MD;  Location: [facility];  Service: Endoscopy;  Laterality: N/A;  . HERNIA REPAIR    . HOT HEMOSTASIS N/A 09/12/2013   Procedure: HOT HEMOSTASIS (ARGON PLASMA COAGULATION/BICAP);  Surgeon: Milus Banister, MD;  Location: [facility];  Service: Endoscopy;  Laterality: N/A;  . IR NEPHROSTOMY PLACEMENT LEFT  05/09/2017  . IR NEPHROSTOMY PLACEMENT RIGHT  05/09/2017  . LIPOMA EXCISION    . OTHER SURGICAL HISTORY     s/p prostate radiation  . TONSILLECTOMY    . TOTAL KNEE ARTHROPLASTY Left 03/25/2014   Procedure: LEFT TOTAL KNEE ARTHROPLASTY;  Surgeon: [name], MD;  Location: [facility];  Service: Orthopedics;  Laterality: Left;  . TOTAL KNEE ARTHROPLASTY Right 05/06/2014   Procedure: RIGHT TOTAL KNEE ARTHROPLASTY;  Surgeon: [name], MD;  Location: [facility];  Service: Orthopedics;  Laterality: Right;    Assessment & Plan Clinical Impression: Patient is a 83 y.o.right handed malewith history of Diabetes mellitus, hypertension, prostate cancer with radiation therapy, CVA November 2017and did not receive inpatient rehabilitation services discharged to home at supervision level, A. fib maintained on Eliquis, right TKA 2015. Per chart review and familypatient lives with spouse, who can only provide supervision at discharge. Independent driving prior to admission. One level home.Wife limited physical assistance.Presented 05/06/2017 with generalized weakness/lethargy, weight loss and recurrent falls over the  past 2 weeks as well as reported urinary frequency and nocturia. Creatinine elevated 4.35fom baseline 1.01 BUN 43. Recent CT revealed left hydronephrosis with dilation of the ureter down to the pelvic brim ofthe iliac and then the ureter appeared to taper to a normal caliber. Ultrasound follow-up revealed moderate left hydronephrosis. Urology consulted  and underwent cystoscopy with bilateral retrograde myelogram with findings of ureteral strictures and underwent stent placement 05/09/2017 per Dr EJunious Silkas well as bilateral nephrostomy tubes per interventional radiology 05/10/2017. Nephrology consulted for AKIfelt to be mostly obstructive and continue to monitorhis creatinine 1.57. Chronic Eliquisresumed 05/11/2017.Coag negative staph UTI and completing course of doxycycline and monitoring white blood cell count 16,000 as patient remains afebrile.Patient with intermittent bouts of restlessness agitation requiring Ativan.Therapy evaluations ongoing. M.D. has requested physical medicine rehabilitation consult.  Patient transferred to CIR on 05/13/2017 .   Patient currently requires mod with mobility secondary to muscle weakness, decreased cardiorespiratoy endurance and decreased problem solving and decreased memory.  Prior to hospitalization, patient was independent  with mobility and lived with Spouse in a House home.  Home access is 1 (1 4" step onto porch, then 6" step into house)Stairs to enter.  Patient will benefit from skilled PT intervention to maximize safe functional mobility, minimize fall risk and decrease caregiver burden for planned discharge home with 24 hour supervision.  Anticipate patient will benefit from follow up [place] discharge.  PT - End of Session Activity Tolerance: Tolerates 30+ min activity with multiple rests Endurance Deficit: Yes PT Assessment Rehab Potential (ACUTE/IP ONLY): Good Barriers to Discharge: Decreased caregiver support;Inaccessible home environment PT Patient demonstrates impairments in the following area(s): Balance;Endurance;Motor;Perception PT Transfers Functional Problem(s): Bed Mobility;Bed to Chair;Car PT Locomotion Functional Problem(s): Ambulation;Stairs PT Plan PT Intensity: Minimum of 1-2 x/day ,45 to 90 minutes PT Frequency: 5 out of 7 days PT Duration Estimated Length of Stay: 7 to 10  days PT Treatment/Interventions: Ambulation/gait training;Balance/vestibular training;Discharge planning;Functional mobility training;Neuromuscular re-education;Patient/family education;Therapeutic Exercise;UE/LE Strength taining/ROM;Therapeutic Activities;UE/LE Coordination activities;Wheelchair propulsion/positioning PT Transfers Anticipated Outcome(s): mod I transfers PT Locomotion Anticipated Outcome(s): mod I gait, S stairs PT Recommendation Follow Up Recommendations: Home health PT Patient destination: Home Equipment Recommended: To be determined  Skilled Therapeutic Intervention PT evaluation completed and treatment plan initiated. Pt performed multiple sit to stand transfers with min to mod A and verbal cues. Pt's level of assistance increased as patient fatigue and surface decreased. Following treatment patient returned to room and left sitting up in chair with all needs within reach. Discussed POC with patient, daughter and wife, all questions answered.   PT Evaluation Precautions/Restrictions Precautions Precautions: Fall Precaution Comments: B Nephrostomy tubes Restrictions Weight Bearing Restrictions: No General Chart Reviewed: Yes Family/Caregiver Present: Yes Vital Signs Pain Pain Assessment Pain Assessment: No/denies pain Home Living/Prior Functioning Home Living Available Help at Discharge: Family;Available 24 hours/day Type of Home: House Home Access: Stairs to enter [employer]of Steps: 1 (1 4" step onto porch, then 6" step into house) Entrance Stairs-Rails: None Home Layout: One level Additional Comments: back door has a 10" step   Lives With: Spouse Prior Function Level of Independence: Independent with gait;Independent with transfers  Able to Take Stairs?: Yes Driving: Yes Comments: drives, takes watercolor classes, has been diong Tai Chi for a few years, loves to travel Vision/Perception  As per OT evaluation. Perception Perception:  Impaired  Cognition Arousal/Alertness: Awake/alert Orientation Level: Oriented to person;Oriented to situation;Disoriented to time;Disoriented to place Memory: Impaired Memory Impairment: Decreased recall of new information Problem  Solving: Impaired Safety/Judgment: Impaired Sensation Sensation Light Touch: Appears Intact Motor  Motor Motor - Skilled Clinical Observations: decreased corodination and strength R LE  Mobility Bed Mobility Bed Mobility: Rolling Right;Supine to Sit Rolling Right: 5: Supervision Supine to Sit: 4: Min assist Transfers Transfers: Yes Sit to Stand: 3: Mod assist Stand to Sit: 3: Mod assist Stand Pivot Transfers: 3: Mod assist Locomotion  Ambulation Ambulation: Yes Ambulation/Gait Assistance: 3: Mod assist Ambulation Distance (Feet): 25 Feet Assistive device: None Stairs / Additional Locomotion Stairs: Yes Stairs Assistance: 3: Mod assist Number of Stairs: 1 Height of Stairs: 6 Wheelchair Mobility Wheelchair Mobility: Yes Wheelchair Assistance: 5: Careers information officer: Both lower extermities  Trunk/Postural Assessment  Cervical Assessment Cervical Assessment: Exceptions to [facility] Thoracic Assessment Thoracic Assessment: Exceptions to [facility] Lumbar Assessment Lumbar Assessment: Exceptions to [facility]  Balance Balance Balance Assessed: Yes Dynamic Sitting Balance Dynamic Sitting - Balance Support: Right upper extremity supported Dynamic Sitting - Level of Assistance: 5: Stand by assistance Dynamic Standing Balance Dynamic Standing - Balance Support: During functional activity Dynamic Standing - Level of Assistance: 3: Mod assist Extremity Assessment  B UEs as per OT evaluation.  RLE Assessment RLE Assessment: Exceptions to [facility] RLE PROM (degrees) Overall PROM Right Lower Extremity: Within functional limits for tasks assessed RLE Strength RLE Overall Strength: Deficits RLE Overall Strength Comments: grossly 2+/5 to 3-/5 LLE  Assessment LLE Assessment: Exceptions to [facility] LLE PROM (degrees) Overall PROM Left Lower Extremity: Within functional limits for tasks assessed LLE Strength LLE Overall Strength: Deficits LLE Overall Strength Comments: grossly 3-/5 to 3/5   See Function Navigator for Current Functional Status.   Refer to Care Plan for Long Term Goals  Recommendations for other services: None   Discharge Criteria: Patient will be discharged from PT if patient refuses treatment 3 consecutive times without medical reason, if treatment goals not met, if there is a change in medical status, if patient makes no progress towards goals or if patient is discharged from hospital.  The above assessment, treatment plan, treatment alternatives and goals were discussed and mutually agreed upon: by patient and by family  Dub Amis 05/14/2017, 12:59 PM

## 2017-05-14 NOTE — Progress Notes (Signed)
Patient ID: William [name], male   DOB: 1933/12/07, 83 y.o.   MRN: [id number]   05/14/17.  William [name] is a 83 y.o. male who is admitted for CIR with debility secondary to acute renal failure.  He is status post stenting on 05/09/2017 for bilateral hydronephrosis; he is status post bilateral nephrostomy tubes placed 05/10/2017.    Subjective: No new complaints. No new problems.  Patient doing quite well.  White count noted to be elevated yesterday.  Remains afebrile  Past Medical History:  Diagnosis Date  . Arthritis   . Diabetes mellitus without complication ([place])   . Hypercholesteremia   . Hypertension   . Kidney stones   . Prostate cancer [facility])    with radiation treatment  . Sleep apnea 2/14   "mild per patient" hasnt gotten CPAP machine  . Stroke ([place])    BP Readings from Last 3 Encounters:  05/14/17 (!) 156/71  05/13/17 (!) 158/86  01/05/17 (!) 173/88    CBG (last 3)   Recent Labs  05/13/17 1644 05/13/17 2117 05/14/17 0640  GLUCAP 177* 181* 179*      Objective: Vital signs in last 24 hours: Temp:  [98.1 F (36.7 C)-98.2 F (36.8 C)] 98.2 F (36.8 C) (06/17 0500) Pulse Rate:  [80-94] 80 (06/17 0500) Resp:  [16-17] 16 (06/17 0500) BP: (143-156)/(71-99) 156/71 (06/17 0500) SpO2:  [97 %-98 %] 97 % (06/17 0500) Weight:  [205 lb 4.8 oz (93.1 kg)] 205 lb 4.8 oz (93.1 kg) (06/16 1500) Weight change:  Last BM Date: 05/11/17  Intake/Output from previous day: 06/16 0701 - 06/17 0700 In: 360 [P.O.:360] Out: 925 [Drains:925]   Physical Exam General: No apparent distress   HEENT: not dry Lungs: Normal effort. Lungs clear to auscultation, no crackles or wheezes. Cardiovascular: Regular rate and rhythm, no edema Abdomen: S/NT/ND; BS(+); nephrostomy tubes draining Musculoskeletal:  unchanged Neurological: No new neurological deficits  Mental state: Alert, oriented, cooperative    Lab Results: BMET    Component Value Date/Time   NA 141 05/13/2017 0753   K 4.3 05/13/2017 0753   CL 107 05/13/2017 0753   CO2 26 05/13/2017 0753   GLUCOSE 177 (H) 05/13/2017 0753   BUN 34 (H) 05/13/2017 0753   CREATININE 1.47 (H) 05/13/2017 0753   CALCIUM 8.8 (L) 05/13/2017 0753   GFRNONAA 43 (L) 05/13/2017 0753   GFRAA 50 (L) 05/13/2017 0753   CBC    Component Value Date/Time   WBC 19.4 (H) 05/13/2017 0753   RBC 4.09 (L) 05/13/2017 0753   HGB 12.0 (L) 05/13/2017 0753   HCT 37.8 (L) 05/13/2017 0753   HCT 39.5 05/11/2017 0959   PLT 219 05/13/2017 0753   MCV 92.4 05/13/2017 0753   MCH 29.3 05/13/2017 0753   MCHC 31.7 05/13/2017 0753   RDW 14.1 05/13/2017 0753   LYMPHSABS 1.1 10/26/2016 1033   MONOABS 1.0 10/26/2016 1033   EOSABS 0.3 10/26/2016 1033   BASOSABS 0.0 10/26/2016 1033    Studies/Results: No results found.  Medications: I have reviewed the patient's current medications.  Assessment/Plan:  Functional deficits secondary to acute renal failure.  Status post bilateral nephrostomy tubes Diabetes mellitus.  Continue basal insulin and sliding scale coverage Hypertension.  Reasonable control and seems to be trending down Atrial fibrillation.  Continue anticoagulation Leukocytosis.  Follow-up CBC in the morning    Length of stay, days: [place] , MD 05/14/2017, 11:14 AM

## 2017-05-15 ENCOUNTER — Inpatient Hospital Stay (HOSPITAL_COMMUNITY): Payer: Medicare Other | Admitting: *Deleted

## 2017-05-15 ENCOUNTER — Inpatient Hospital Stay (HOSPITAL_COMMUNITY): Payer: Medicare Other | Admitting: Physical Therapy

## 2017-05-15 ENCOUNTER — Inpatient Hospital Stay (HOSPITAL_COMMUNITY): Payer: Medicare Other | Admitting: Occupational Therapy

## 2017-05-15 ENCOUNTER — Inpatient Hospital Stay (HOSPITAL_COMMUNITY): Payer: Medicare Other

## 2017-05-15 DIAGNOSIS — N390 Urinary tract infection, site not specified: Secondary | ICD-10-CM

## 2017-05-15 DIAGNOSIS — I10 Essential (primary) hypertension: Secondary | ICD-10-CM

## 2017-05-15 DIAGNOSIS — D72829 Elevated white blood cell count, unspecified: Secondary | ICD-10-CM

## 2017-05-15 LAB — COMPREHENSIVE METABOLIC PANEL
ALBUMIN: 2.5 g/dL — AB (ref 3.5–5.0)
ALK PHOS: 99 U/L (ref 38–126)
ALT: 66 U/L — ABNORMAL HIGH (ref 17–63)
ANION GAP: 10 (ref 5–15)
AST: 62 U/L — ABNORMAL HIGH (ref 15–41)
BUN: 27 mg/dL — ABNORMAL HIGH (ref 6–20)
CALCIUM: 8.9 mg/dL (ref 8.9–10.3)
CHLORIDE: 103 mmol/L (ref 101–111)
CO2: 26 mmol/L (ref 22–32)
Creatinine, Ser: 1.39 mg/dL — ABNORMAL HIGH (ref 0.61–1.24)
GFR calc non Af Amer: 46 mL/min — ABNORMAL LOW (ref >60)
GFR, EST AFRICAN AMERICAN: 54 mL/min — AB (ref >60)
GLUCOSE: 170 mg/dL — AB (ref 65–99)
Potassium: 4.3 mmol/L (ref 3.5–5.1)
SODIUM: 139 mmol/L (ref 135–145)
Total Bilirubin: 1 mg/dL (ref 0.3–1.2)
Total Protein: 5.6 g/dL — ABNORMAL LOW (ref 6.5–8.1)

## 2017-05-15 LAB — CBC WITH DIFFERENTIAL/PLATELET
BASOS PCT: 0 %
Basophils Absolute: 0.1 10*3/uL (ref 0.0–0.1)
EOS ABS: 0.2 10*3/uL (ref 0.0–0.7)
Eosinophils Relative: 1 %
HCT: 41.2 % (ref 39.0–52.0)
HEMOGLOBIN: 13.3 g/dL (ref 13.0–17.0)
Lymphocytes Relative: 7 %
Lymphs Abs: 1.3 10*3/uL (ref 0.7–4.0)
MCH: 30.1 pg (ref 26.0–34.0)
MCHC: 32.3 g/dL (ref 30.0–36.0)
MCV: 93.2 fL (ref 78.0–100.0)
MONOS PCT: 7 %
Monocytes Absolute: 1.4 10*3/uL — ABNORMAL HIGH (ref 0.1–1.0)
NEUTROS PCT: 85 %
Neutro Abs: 16.7 10*3/uL — ABNORMAL HIGH (ref 1.7–7.7)
Platelets: 291 10*3/uL (ref 150–400)
RBC: 4.42 MIL/uL (ref 4.22–5.81)
RDW: 14.4 % (ref 11.5–15.5)
WBC: 19.7 10*3/uL — AB (ref 4.0–10.5)

## 2017-05-15 LAB — GLUCOSE, CAPILLARY
Glucose-Capillary: 144 mg/dL — ABNORMAL HIGH (ref 65–99)
Glucose-Capillary: 217 mg/dL — ABNORMAL HIGH (ref 65–99)
Glucose-Capillary: 182 mg/dL — ABNORMAL HIGH (ref 65–99)
Glucose-Capillary: 139 mg/dL — ABNORMAL HIGH (ref 65–99)

## 2017-05-15 MED ORDER — ACETAMINOPHEN 325 MG PO TABS
650.0000 mg | ORAL_TABLET | Freq: Four times a day (QID) | ORAL | Status: DC | PRN
  Administered 2017-05-15 – 2017-05-23 (×9): 650 mg via ORAL
  Filled 2017-05-15 (×12): qty 2

## 2017-05-15 NOTE — Progress Notes (Signed)
[name], MD Physician Signed Physical Medicine and Rehabilitation  Consult Note Date of Service: 05/09/2017 7:43 AM  Related encounter: ED to Hosp-Admission (Discharged) from 05/06/2017 in [place] All Collapse All   '[]'$ Hide copied text '[]'$ Hover for attribution information      Physical Medicine and Rehabilitation Consult Reason for Consult: Decreased functional mobility Referring Physician: [facility]   HPI: William [name] is a 83 y.o. right handed male with history of hypertension, prostate cancer with radiation therapy, CVA November 2017, A. fib maintained on Eliquis. Per chart review and family patient lives with spouse, who can only provide supervision at discharge. Independent driving prior to admission. One level home. Wife limited physical assistance. Presented 05/06/2017 with generalized weakness/lethargy, weight loss and recurrent falls over the past 2 weeks as well as reported urinary frequency and nocturia. Creatinine elevated 2.1 from baseline 1.01 BUN 43. Recent CT revealed left hydronephrosis with dilation of the ureter down to the pelvic brim of the iliac and then the ureter appeared to taper to a normal caliber. Ultrasound follow-up revealed moderate left hydronephrosis. Urology consulted and underwent cystoscopy with bilateral retrograde myelogram with findings of ureteral strictures and underwent stent placement. Nephrology consulted for AKI felt to be mostly obstructive and continue to monitor. Chronic Eliquis remains on hold. Awaiting plans for possible need for bilateral neph tubes. Therapy evaluations ongoing. M.D. has requested physical medicine rehabilitation consult.   Review of Systems  Constitutional: Positive for malaise/fatigue and weight loss. Negative for fever.  Eyes: Negative for blurred vision and double vision.  Respiratory: Positive for shortness of breath. Negative for cough.   Cardiovascular:  Negative for chest pain.  Gastrointestinal: Positive for constipation. Negative for nausea and vomiting.  Genitourinary: Positive for frequency and urgency. Negative for hematuria.  Musculoskeletal: Positive for falls and myalgias.  Skin: Negative for rash.  Neurological: Positive for weakness. Negative for seizures.  All other systems reviewed and are negative.      Past Medical History:  Diagnosis Date  . Arthritis   . Diabetes mellitus without complication ([place])   . Hypercholesteremia   . Hypertension   . Prostate cancer [facility])    with radiation treatment  . Sleep apnea 2/14   "mild per patient" hasnt gotten CPAP machine  . Stroke [facility])         Past Surgical History:  Procedure Laterality Date  . ADENOIDECTOMY    . CYSTOSCOPY W/ URETERAL STENT PLACEMENT Bilateral 05/07/2017   Procedure: CYSTOSCOPY WITH BILATERAL RETROGRADE PYELOGRAM CYSTOGRAM BILATERAL URETERAL STENT PLACEMENT;  Surgeon: Festus Aloe, MD;  Location: [place];  Service: Urology;  Laterality: Bilateral;  . FLEXIBLE SIGMOIDOSCOPY N/A 09/12/2013   Procedure: FLEXIBLE SIGMOIDOSCOPY;  Surgeon: Milus Banister, MD;  Location: [facility];  Service: Endoscopy;  Laterality: N/A;  . HERNIA REPAIR    . HOT HEMOSTASIS N/A 09/12/2013   Procedure: HOT HEMOSTASIS (ARGON PLASMA COAGULATION/BICAP);  Surgeon: Milus Banister, MD;  Location: [facility];  Service: Endoscopy;  Laterality: N/A;  . LIPOMA EXCISION    . OTHER SURGICAL HISTORY     s/p prostate radiation  . TONSILLECTOMY    . TOTAL KNEE ARTHROPLASTY Left 03/25/2014   Procedure: LEFT TOTAL KNEE ARTHROPLASTY;  Surgeon: [name], MD;  Location: [facility];  Service: Orthopedics;  Laterality: Left;  . TOTAL KNEE ARTHROPLASTY Right 05/06/2014   Procedure: RIGHT TOTAL KNEE ARTHROPLASTY;  Surgeon: [name], MD;  Location: [facility];  Service:  Orthopedics;  Laterality: Right;        Family History  Problem Relation Age of Onset  . Colon  cancer Father   . Stroke Maternal Grandmother   . Esophageal cancer Neg Hx   . Rectal cancer Neg Hx   . Stomach cancer Neg Hx    Social History:  reports that he has quit smoking. He started smoking about 34 years ago. He has never used smokeless tobacco. He reports that he drinks about 0.6 oz of alcohol per week . He reports that he does not use drugs. Allergies:       Allergies  Allergen Reactions  . Glyburide Other (See Comments)    Hypoglycemia          Medications Prior to Admission  Medication Sig Dispense Refill  . acetaminophen (TYLENOL) 325 MG tablet Take 325-650 mg by mouth every 6 (six) hours as needed for mild pain.    Marland Kitchen amoxicillin (AMOXIL) 500 MG capsule Take 2,000 mg by mouth See admin instructions. ONE HOUR PRIOR TO DENTAL PROCEDURES    . apixaban (ELIQUIS) 5 MG TABS tablet Take 1 tablet (5 mg total) by mouth 2 (two) times daily. 30 tablet 0  . Coenzyme Q10 (CO Q-10) 300 MG CAPS Take 300 mg by mouth daily with breakfast.    . Glucosamine-Chondroit-Vit C-Mn (GLUCOSAMINE-CHONDROITIN) TABS Take 1 tablet by mouth 2 (two) times daily.    Marland Kitchen HUMALOG KWIKPEN 100 UNIT/ML KiwkPen Inject 6 Units into the skin 3 (three) times daily before meals.     Marland Kitchen loperamide (IMODIUM A-D) 2 MG tablet Take 2 mg by mouth 4 (four) times daily as needed for diarrhea or loose stools.    . ONGLYZA 5 MG TABS tablet Take 5 mg by mouth every morning.     . pravastatin (PRAVACHOL) 40 MG tablet Take 40 mg by mouth daily with supper.     Marland Kitchen LEVEMIR FLEXTOUCH 100 UNIT/ML Pen Inject 16 Units into the skin at bedtime.     Marland Kitchen lisinopril-hydrochlorothiazide (PRINZIDE,ZESTORETIC) 10-12.5 MG per tablet Take 1 tablet by mouth every morning.     . metFORMIN (GLUCOPHAGE) 1000 MG tablet Take 1,000 mg by mouth 2 (two) times daily with a meal.      Home: Home Living Family/patient expects to be discharged to:: Private residence Living Arrangements: Spouse/significant other Available  Help at Discharge: Family, Available 24 hours/day Type of Home: House Home Access: Stairs to enter Secretary/administrator of Steps: 1 Entrance Stairs-Rails: None Home Layout: One level Bathroom Shower/Tub: Tub/shower unit, Psychologist, counselling, Corporate treasurer Toilet: Handicapped height Home Equipment: Grab bars - tub/shower, Medical laboratory scientific officer - single point, Information systems manager - built in  Functional History: Prior Function Level of Independence: Independent Comments: drives, takes Statistician, has been [employer] for a few years, loves to travel Functional Status:  Mobility: Bed Mobility Overal bed mobility: Needs Assistance Bed Mobility: Supine to Sit Supine to sit: Mod assist General bed mobility comments: Light mod assist to pull to sit Transfers Overall transfer level: Needs assistance Equipment used: Rolling walker (2 wheeled) Transfers: Sit to/from Stand Sit to Stand: Mod assist General transfer comment: Light mod assist to power up Ambulation/Gait Ambulation/Gait assistance: Min assist Ambulation Distance (Feet): 10 Feet Assistive device: Rolling walker (2 wheeled) Gait Pattern/deviations: Step-through pattern General Gait Details: Cues for RW proximity and use; occasionally step soutside RW; benefits from bil UE support  ADL:  Cognition: Cognition Overall Cognitive Status: Within Functional Limits for tasks assessed Orientation Level: Oriented to  person, Oriented to place, Oriented to situation, Disoriented to time Cognition Arousal/Alertness: Awake/alert Behavior During Therapy: WFL for tasks assessed/performed Overall Cognitive Status: Within Functional Limits for tasks assessed  Blood pressure (!) 157/61, pulse 73, temperature 98 F (36.7 C), temperature source Oral, resp. rate 17, height '6\' 1"'$  (1.854 m), weight 91.9 kg (202 lb 9.6 oz), SpO2 96 %. Physical Exam  Vitals reviewed. Constitutional: He appears well-developed and well-nourished.  HENT:  Head:  Normocephalic.  Right Ear: External ear normal.  Left Ear: External ear normal.  Eyes: EOM are normal. Right eye exhibits no discharge. Left eye exhibits no discharge.  Neck: Normal range of motion. Neck supple. No thyromegaly present.  Cardiovascular:  Irregularly Irregular  Respiratory: Effort normal and breath sounds normal. No respiratory distress.  GI: Soft. Bowel sounds are normal. He exhibits no distension.  Musculoskeletal: He exhibits no edema or tenderness.  Neurological: He is alert.  Provider name and age.  He does show some delay in processing.  Follows simple commands HOH Motor: Grossly 5/5 throughout Sensation intact to light touch  Skin: Skin is warm and dry.  Psychiatric: He has a normal mood and affect. His behavior is normal.        Assessment/Plan: Diagnosis: ureteral strictures s/p stents Labs independently reviewed.  Records reviewed and summated above.  1. Does the need for close, 24 hr/day medical supervision in concert with the patient's rehab needs make it unreasonable for this patient to be served in a less intensive setting? Yes 2. Co-Morbidities requiring supervision/potential complications: AKI (recs per neprho, avoid nephrotoxic meds), hydronephrosis, HTN (monitor and provide prns in accordance with increased physical exertion and pain), prostate cancer with radiation therapy (cont to monitor), CVA November 2017, A. fib (transition from heparin ggt to oral meds per urology, monitor HR with increased physical activity), HTN (monitor and provide prns in accordance with increased physical exertion and pain), DM (Monitor in accordance with exercise and adjust meds as necessary), leukocytosis (cont to monitor for signs and symptoms of infection, further workup if indicated), ABLA (transfuse if necessary to ensure appropriate perfusion for increased activity tolerance) 3. Due to bladder management, safety, skin/wound care, disease management, pain management  and patient education, does the patient require 24 hr/day rehab nursing? Yes 4. Does the patient require coordinated care of a physician, rehab nurse, PT (1-2 hrs/day, 5 days/week) and OT (1-2 hrs/day, 5 days/week) to address physical and functional deficits in the context of the above medical diagnosis(es)? Yes Addressing deficits in the following areas: balance, endurance, locomotion, bathing, dressing, toileting and psychosocial support 5. Can the patient actively participate in an intensive therapy program of at least 3 hrs of therapy per day at least 5 days per week? Yes 6. The potential for patient to make measurable gains while on inpatient rehab is excellent 7. Anticipated functional outcomes upon discharge from inpatient rehab are modified independent  with PT, modified independent with OT, n/a with SLP. 8. Estimated rehab length of stay to reach the above functional goals is: 7-10 days. 9. Anticipated D/C setting: Home 10. Anticipated post D/C treatments: HH therapy and Home excercise program 11. Overall Rehab/Functional Prognosis: excellent  RECOMMENDATIONS: This patient's condition is appropriate for continued rehabilitative care in the following setting: Recommend CIR after medical interventions complete if persistent deficits, however, pt and family interested in SNF close to home. Patient has agreed to participate in recommended program. Potentially Note that insurance prior authorization may be required for reimbursement for recommended care.  Comment: Rehab  Admissions Coordinator to follow up.  Delice Lesch, MD, Mellody Drown Cathlyn Parsons., PA-C 05/09/2017    Revision History                        Routing History

## 2017-05-15 NOTE — Progress Notes (Signed)
Physical Therapy Session Note  Patient Details  Name: William [name] MRN: [id number] Date of Birth: October 05, 1934  Today's Date: 05/15/2017 PT Individual Time: 1005-1105 PT Individual Time Calculation (min): 60 min   Short Term Goals: Week 1:  PT Short Term Goal 1 (Week 1): Pt will increasebed mobility to S.  PT Short Term Goal 2 (Week 1): Pt will increase transfers bed to chair to S.  PT Short Term Goal 3 (Week 1): Pt will ambulate with rolling walker and S about 150 feet. PT Short Term Goal 4 (Week 1): Pt will ascend/descend 2 step x 2 with rolling walker.   Skilled Therapeutic Interventions/Progress Updates:    Tx focused on functional mobility, activity tolerance, gait with RW, and therex. Pt up in [facility] with spouse, feeling fatigued, but in no pain.  Sit<>stand transfers with multiple attempts, momentum and Mod lifting assist with cues for technique.  Gait with RW in bathroom and hall x12' and x40' with min A and cues for breathing. Distance limited by fatigue. Pt noted to be holding breath, and 02 sats found to be in 80's, recovered to >90 in 1 min. Pt provided with incentive spirometer and instructed on use, target, and frequency.  Stand-pivot transfer with Mod A and cues for technique.  Nustep x10 min at level 3 with all extremities. Pt needed 2 brief rest breaks.  Stair training x4 with min A and 2 rails with cues for technique.   Therapy Documentation Precautions:  Precautions Precautions: Fall Precaution Comments: B Nephrostomy tubes Restrictions Weight Bearing Restrictions: No    Pain: none   See Function Navigator for Current Functional Status.   Therapy/Group: Individual Therapy  Kennieth Rad, PT, DPT   Kennieth Rad M 05/15/2017, 12:41 PM

## 2017-05-15 NOTE — Progress Notes (Signed)
Retta Diones, RN Rehab Admission Coordinator Signed Physical Medicine and Rehabilitation  PMR Pre-admission Date of Service: 05/10/2017 2:02 PM  Related encounter: ED to Hosp-Admission (Discharged) from 05/06/2017 in [facility]       '[]'$ Hide copied text PMR Admission Coordinator Pre-Admission Assessment  Patient: William [name] is an 83 y.o., male MRN: [id number] DOB: Apr 19, 1934 Height: '6\' 2"'$  (188 cm) Weight: 94.3 kg (208 lb)                                                                                                                                             Insurance Information HMO: No    PPO:       PCP:       IPA:       80/20:       OTHER:   PRIMARY:  Medicare A/B      Policy#: [id number]      Subscriber: [place] CM Name:        Phone#:       Fax#:   Pre-Cert#:        Employer: Retired Benefits:  Phone #:       Name: Checked in [place]. Date: 05/28/01     Deduct:  $1340      Out of Pocket Max:  None      Life Max: N/A CIR: 100%      SNF: 100 days Outpatient: 80%     Co-Pay: 20% Home Health: 100%      Co-Pay: none DME: 80%     Co-Pay: 20% Providers: patient's choice  SECONDARY:  Generic commercial      Policy#: [id number]      Subscriber:  [place] CM Name:        Phone#:       Fax#:   Pre-Cert#:        Employer:  Retired Benefits:  Phone #:  [phone]     Name:   Eff. Date:       Deduct:        Out of Pocket Max:        Life Max:   CIR:        SNF:   Outpatient:       Co-Pay:   Home Health:        Co-Pay:   DME:       Co-Pay:    Emergency Contact Information        Contact Information    Name Relation Home Work Mignon Spouse [phone]  [phone]   Phelan,Veronica Daughter [phone]       Current Medical History  Patient Admitting Diagnosis:  Ureteral strictures s/p stents  History of Present Illness: An 83 y.o.right handed malewith history of Diabetes mellitus,  hypertension, prostate cancer with radiation therapy, CVA November 2017and did not receive  inpatient rehabilitation services discharged to home at supervision level, A. fib maintained on Eliquis, right TKA 2015. Per chart review and familypatient lives with spouse, who can only provide supervision at discharge. Independent driving prior to admission. One level home.Wife limited physical assistance.Presented 05/06/2017 with generalized weakness/lethargy, weight loss and recurrent falls over the past 2 weeks as well as reported urinary frequency and nocturia. Creatinine elevated 4.12fom baseline 1.01 BUN 43. Recent CT revealed left hydronephrosis with dilation of the ureter down to the pelvic brim ofthe iliac and then the ureter appeared to taper to a normal caliber. Ultrasound follow-up revealed moderate left hydronephrosis. Urology consulted and underwent cystoscopy with bilateral retrograde myelogram with findings of ureteral strictures and underwent stent placement 05/09/2017 per Dr EJunious Silkas well as bilateral nephrostomy tubes per interventional radiology 05/10/2017. Nephrology consulted for AKIfelt to be mostly obstructive and continue to monitorhis creatinine 1.57. Chronic Eliquisresumed 05/11/2017.Coag negative staph UTI and completing course of doxycycline and monitoring white blood cell count 16,000 as patient remains afebrile.Sodium trending up to 147 on 05/12/17 and plans to push fluids/hydrate over the next day.   Patient with intermittent bouts of restlessness agitation requiring Ativan.Therapy evaluations ongoing. M.D. has requested physical medicine rehabilitation consult. Patient to be admitted for a comprehensive inpatient rehabilitation program.  Past Medical History      Past Medical History:  Diagnosis Date  . Arthritis   . [place]   . Hypercholesteremia   . Hypertension   . Prostate cancer ([facility]    with radiation treatment  .  Sleep apnea 2/14   "mild per patient" hasnt gotten CPAP machine  . Stroke ([facility]     Family History  family history includes Colon cancer in his father; Stroke in his maternal grandmother.  Prior Rehab/Hospitalizations: Had outpatient therapy at neuro rehab after CVA 11/17.  Had [facility] after B TKR 3 years ago.  Has the patient had major surgery during 100 days prior to admission? No  Current Medications   Current Facility-Administered Medications:  .  acetaminophen (TYLENOL) tablet 650 mg, 650 mg, Oral, Q6H PRN, CDessa PhiChahn-Yang, DO, 650 mg at 05/11/17 1821 .  amLODipine (NORVASC) tablet 5 mg, 5 mg, Oral, QHS, Deterding, James, MD, 5 mg at 05/11/17 2210 .  apixaban (ELIQUIS) tablet 5 mg, 5 mg, Oral, BID, CDessa PhiChahn-Yang, DO, 5 mg at 05/12/17 1056 .  doxycycline (VIBRA-TABS) tablet 100 mg, 100 mg, Oral, Q12H, CDessa PhiChahn-Yang, DO, 100 mg at 05/12/17 1056 .  feeding supplement (ENSURE ENLIVE) (ENSURE ENLIVE) liquid 237 mL, 237 mL, Oral, BID BM, CDessa PhiChahn-Yang, DO, 237 mL at 05/12/17 1055 .  insulin aspart (novoLOG) injection 0-9 Units, 0-9 Units, Subcutaneous, TID WC, GPatrecia Pour MD, 2 Units at 05/12/17 0820 .  LORazepam (ATIVAN) tablet 1 mg, 1 mg, Oral, PRN, CDessa PhiChahn-Yang, DO .  MEDLINE mouth rinse, 15 mL, Mouth Rinse, BID, GVance GatherB, MD, 15 mL at 05/12/17 1056 .  multivitamin (RENA-VIT) tablet 1 tablet, 1 tablet, Oral, QHS, CDessa PhiChahn-Yang, DO, 1 tablet at 05/11/17 2210 .  pravastatin (PRAVACHOL) tablet 40 mg, 40 mg, Oral, Q supper, GPatrecia Pour MD, 40 mg at 05/11/17 1822 .  sodium bicarbonate tablet 1,300 mg, 1,300 mg, Oral, TID, Deterding, James, MD, 1,300 mg at 05/12/17 1056 .  sodium chloride flush (NS) 0.9 % injection 3 mL, 3 mL, Intravenous, Q12H, GPatrecia Pour MD, 3 mL at 05/09/17 2101 .  traZODone (DESYREL) tablet  50 mg, 50 mg, Oral, QHS, Dessa Phi Chahn-Yang, DO, 50 mg at 05/11/17  2210  Patients Current Diet: Diet heart healthy/carb modified Room service appropriate? Yes; Fluid consistency: Thin  Precautions / Restrictions Precautions Precautions: Fall Restrictions Weight Bearing Restrictions: No   Has the patient had 2 or more falls or a fall with injury in the past year?Yes.  Reports 2-3 falls recently with cut to his head and a bruise on his hip.  Prior Activity Level Community (5-7x/wk): Went out daily.  Was active, driving.  Home Assistive Devices / Equipment Home Assistive Devices/Equipment: CBG Meter, Dentures (specify type), Eyeglasses, Hand-held shower hose, Hearing aid, Tub transfer bench, Walker (specify type) Home Equipment: Grab bars - tub/shower, Cane - single point, Shower seat - built in  Prior Device Use: Indicate devices/aids used by the patient prior to current illness, exacerbation or injury? Seated Walker for the past few days only.  Prior Functional Level Prior Function Level of Independence: Independent Comments: drives, takes Risk analyst classes, has been diong Tai Chi for a few years, loves to travel  Self Care: Did the patient need help bathing, dressing, using the toilet or eating?  Independent  Indoor Mobility: Did the patient need assistance with walking from room to room (with or without device)? Independent  Stairs: Did the patient need assistance with internal or external stairs (with or without device)? Independent  Functional Cognition: Did the patient need help planning regular tasks such as shopping or remembering to take medications? Independent  Current Functional Level Cognition  Overall Cognitive Status: Within Functional Limits for tasks assessed Orientation Level: Oriented to person, Oriented to situation, Disoriented to time, Disoriented to place    Extremity Assessment (includes Sensation/Coordination)  Upper Extremity Assessment: Generalized weakness  Lower Extremity Assessment: Generalized  weakness    ADLs       Mobility  Overal bed mobility: Needs Assistance Bed Mobility: Supine to Sit Supine to sit: Min assist General bed mobility comments: Min A to sit upright at EOB    Transfers  Overall transfer level: Needs assistance Equipment used: Rolling walker (2 wheeled) Transfers: Sit to/from Stand Sit to Stand: Min assist Stand pivot transfers: Mod assist General transfer comment: Min A to power up from edge of bed, cues for hand placement. Pt still reaches for RW to stand    Ambulation / Gait / Stairs / Wheelchair Mobility  Ambulation/Gait Ambulation/Gait assistance: Museum/gallery curator (Feet): 50 Feet Assistive device: Rolling walker (2 wheeled) Gait Pattern/deviations: Step-through pattern General Gait Details: cues for proximity to RW, and upright posture as he fatigues. Gait velocity: decreased Gait velocity interpretation: Below normal speed for age/gender    Posture / Balance Balance Overall balance assessment: Needs assistance Sitting-balance support: No upper extremity supported, Feet supported Sitting balance-Leahy Scale: Fair Standing balance support: Bilateral upper extremity supported Standing balance-Leahy Scale: Poor Standing balance comment: reliant on RW for stability in standing    Special needs/care consideration BiPAP/CPAP Yes, CPAP is new but not using it yet. CPM No Continuous Drip IV Heparin drip Dialysis No        Life Vest No Oxygen No Special Bed No Trach Size No Wound Vac (area) No     Skin Has bruising on arms and on his hip                           Bowel mgmt: Last BM 05/10/17 Bladder mgmt: Condom catheter.  Bilateral nephrostomy tubes. Diabetic mgmt  Yes, on oral medications and insulin at home.    Previous Home Environment Living Arrangements: Spouse/significant other Available Help at Discharge: Family, Available 24 hours/day Type of Home: House Home Layout: One level Home Access: Stairs to  enter Entrance Stairs-Rails: None Entrance Stairs-Number of Steps: 1 Bathroom Shower/Tub: Public librarian, Gaffer, Architectural technologist: Handicapped height Home Care Services: No  Discharge Living Setting Plans for Discharge Living Setting: Patient's home, House, Lives with (comment) (Lives with wife.) Type of Home at Discharge: House Discharge Home Layout: One level Discharge Home Access: Stairs to enter Entrance Stairs-Number of Steps: 1 step entry Does the patient have any problems obtaining your medications?: No  Social/Family/Support Systems Patient Roles: Spouse, Parent (Has a wife and 6 grown children.) Contact Information: Jasn Xia - spouse Anticipated Caregiver: wife Anticipated Caregiver's Contact Information: Mardene Celeste - wife - (h) [phone] (c) [phone] Ability/Limitations of Caregiver: Wife cannot provide physical assistance. Caregiver Availability: 24/7 Discharge Plan Discussed with Primary Caregiver: Yes Is Caregiver In Agreement with Plan?: Yes Does Caregiver/Family have Issues with Lodging/Transportation while Pt is in Rehab?: No  Goals/Additional Needs Patient/Family Goal for Rehab: PT/OT mod I goals Expected length of stay: 7-10 days Cultural Considerations: Catholic Dietary Needs: Heart healthy, carb mod, thin liquids Equipment Needs: TBD Pt/Family Agrees to Admission and willing to participate: Yes Program Orientation Provided & Reviewed with Pt/Caregiver Including Roles  & Responsibilities: Yes  Decrease burden of Care through IP rehab admission: N/A  Possible need for SNF placement upon discharge: Not planned  Patient Condition: This patient's medical and functional status has changed since the consult dated: 05/09/17 in which the Rehabilitation Physician determined and documented that the patient's condition is appropriate for intensive rehabilitative care in an inpatient rehabilitation facility. See "History of Present  Illness" (above) for medical update. Functional changes are:  Currently requiring min assist to ambulate 50 feet RW. Patient's medical and functional status update has been discussed with the Rehabilitation physician and patient remains appropriate for inpatient rehabilitation. Will admit to inpatient rehab tomorrow.  Preadmission Screen Completed By:  Retta Diones, 05/12/2017 12:33 PM ______________________________________________________________________   Discussed status with Dr. Naaman Plummer on 05/12/17 at 1233 and received telephone approval for admission tomorrow.  Admission Coordinator:  Retta Diones, time 1233/Date 05/12/17       Cosigned by: Meredith Staggers, MD at 05/12/2017 12:41 PM  Revision History

## 2017-05-15 NOTE — IPOC Note (Addendum)
Overall Plan of Care [facility]) Patient Details Name: William [name] MRN: [id number] DOB: 05-05-1934  Admitting Diagnosis: [facility] Problems: Active Problems:   Debilitated     Functional Problem List: Nursing Behavior, Bowel, Edema, Endurance, Medication Management, Motor, Nutrition, Bladder, Pain, Perception, Safety, Sensory, Skin Integrity  PT Balance, Endurance, Motor, Perception  OT Balance, Endurance, Motor, Nutrition, Safety  SLP    TR         Basic ADL's: OT Grooming, Bathing, Dressing, Toileting     Advanced  ADL's: OT Simple Meal Preparation     Transfers: PT Bed Mobility, Bed to Chair, Car  OT Toilet, Tub/Shower     Locomotion: PT Ambulation, Stairs     Additional Impairments: OT    SLP        TR      Anticipated Outcomes Item Anticipated Outcome  Self Feeding MOD I  Swallowing      Basic self-care  MOD I  Toileting  MOD I toilet; S shower   Bathroom Transfers MOD I toilet; S shower  Bowel/Bladder  mod I  Transfers  mod I transfers  Locomotion  mod I gait, S stairs  Communication     Cognition     Pain  less than 2  Safety/Judgment  mod I   Therapy Plan: PT Intensity: Minimum of 1-2 x/day ,45 to 90 minutes PT Frequency: 5 out of 7 days PT Duration Estimated Length of Stay: 7 to 10 days OT Intensity: Minimum of 1-2 x/day, 45 to 90 minutes OT Frequency: 5 out of 7 days OT Duration/Estimated Length of Stay: 7-10         Team Interventions: Nursing Interventions Patient/Family Education, Bladder Management, Bowel Management, Disease Management/Prevention, Pain Management, Medication Management, Skin Care/Wound Management, Cognitive Remediation/Compensation, Discharge Planning, Psychosocial Support  PT interventions Ambulation/gait training, Balance/vestibular training, Discharge planning, Functional mobility training, Neuromuscular re-education, Patient/family education, Therapeutic Exercise, UE/LE Strength  taining/ROM, Therapeutic Activities, UE/LE Coordination activities, Wheelchair propulsion/positioning  OT Interventions Training and development officer, Academic librarian, Discharge planning, DME/adaptive equipment instruction, Functional mobility training, Patient/family education, Psychosocial support, Self Care/advanced ADL retraining, Therapeutic Activities, UE/LE Coordination activities, Therapeutic Exercise, UE/LE Strength taining/ROM  SLP Interventions    TR Interventions    SW/CM Interventions  Discharge Planning, Psychosocial Assessment & Pt & Family Education    Team Discharge Planning: Destination: PT-Home ,OT- Home , SLP-  Projected Follow-up: PT-Home health PT, OT-  Home health OT, SLP-  Projected Equipment Needs: PT-To be determined, OT- To be determined, SLP-  Equipment Details: PT- , OT-has shower seat and raised toilet Patient/family involved in discharge planning: PT- Family member/caregiver, Patient,  OT-Patient, Family member/caregiver, SLP-   MD ELOS: 6-9 days. Medical Rehab Prognosis:  Excellent Assessment:  83 y.o. right handed male with history of Diabetes mellitus, hypertension, prostate cancer with radiation therapy, CVA November 2017 and did not receive inpatient rehabilitation services discharged to home at supervision level, A. fib maintained on Eliquis, right TKA 2015. Presented 05/06/2017 with generalized weakness/lethargy, weight loss and recurrent falls over the past 2 weeks as well as reported urinary frequency and nocturia. Creatinine elevated 4.95 from baseline 1.01 BUN 43. Recent CT revealed left hydronephrosis with dilation of the ureter down to the pelvic brim of the iliac and then the ureter appeared to taper to a normal caliber. Ultrasound follow-up revealed moderate left hydronephrosis. Urology consulted and underwent cystoscopy with bilateral retrograde myelogram with findings of ureteral strictures and underwent stent placement 05/09/2017 per Dr Junious Silk  as well  as bilateral nephrostomy tubes per interventional radiology 05/10/2017. Nephrology consulted for AKI felt to be mostly obstructive and continue to monitor his creatinine 1.57. Chronic Eliquis resumed 05/11/2017.Coag negative staph UTI. Patient with intermittent bouts of restlessness agitation requiring Ativan on acute floor with persistently leukocytosis in rehab. Pt with resulting functional deficits with balance and endurance and self care.  Will set goals for Mod I with PT/OT.  See Team Conference Notes for weekly updates to the plan of care

## 2017-05-15 NOTE — Progress Notes (Signed)
Social Work Assessment and Plan Social Work Assessment and Plan  Patient Details  Name: William [name] MRN: [id number] Date of Birth: 1934-03-30  Today's Date: 05/15/2017  Problem List:  Patient Active Problem List   Diagnosis Date Noted  . Debilitated 05/13/2017  . Protein-calorie malnutrition, severe 05/10/2017  . History of prostate cancer   . Hydronephrosis, bilateral   . AKI (acute kidney injury) ([place])   . Diabetes mellitus type 2 in nonobese (H[facility])   . Atrial fibrillation ([place])   . Benign essential HTN   . Leukocytosis   . Acute blood loss anemia   . Hydronephrosis   . Acute renal failure (ARF) ([place]) 05/06/2017  . Acute lower UTI 05/06/2017  . Occult GI bleeding 05/06/2017  . Stroke ([place]) 10/26/2016  . Obese 05/07/2014  . Expected blood loss anemia 05/07/2014  . S/P right TKA 05/06/2014  . DJD (degenerative joint disease) of knee 03/25/2014  . Preop cardiovascular exam 03/19/2014  . Abnormal ECG 03/19/2014  . Hypertension   . IDDM (insulin dependent diabetes mellitus) ([place])   . Hypercholesteremia   . Arthritis   . Prostate cancer ([place])   . Sleep apnea    Past Medical History:  Past Medical History:  Diagnosis Date  . Arthritis   . Diabetes mellitus without complication ([place])   . Hypercholesteremia   . Hypertension   . Kidney stones   . Prostate cancer [facility])    with radiation treatment  . Sleep apnea 2/14   "mild per patient" hasnt gotten CPAP machine  . Stroke [facility])    Past Surgical History:  Past Surgical History:  Procedure Laterality Date  . ADENOIDECTOMY    . CYSTOSCOPY W/ URETERAL STENT PLACEMENT Bilateral 05/07/2017   Procedure: CYSTOSCOPY WITH BILATERAL RETROGRADE PYELOGRAM CYSTOGRAM BILATERAL URETERAL STENT PLACEMENT;  Surgeon: Festus Aloe, MD;  Location: [facility];  Service: Urology;  Laterality: Bilateral;  . FLEXIBLE SIGMOIDOSCOPY N/A 09/12/2013   Procedure: FLEXIBLE SIGMOIDOSCOPY;  Surgeon: Milus Banister, MD;  Location: [facility];   Service: Endoscopy;  Laterality: N/A;  . HERNIA REPAIR    . HOT HEMOSTASIS N/A 09/12/2013   Procedure: HOT HEMOSTASIS (ARGON PLASMA COAGULATION/BICAP);  Surgeon: Milus Banister, MD;  Location: [facility];  Service: Endoscopy;  Laterality: N/A;  . IR NEPHROSTOMY PLACEMENT LEFT  05/09/2017  . IR NEPHROSTOMY PLACEMENT RIGHT  05/09/2017  . LIPOMA EXCISION    . OTHER SURGICAL HISTORY     s/p prostate radiation  . TONSILLECTOMY    . TOTAL KNEE ARTHROPLASTY Left 03/25/2014   Procedure: LEFT TOTAL KNEE ARTHROPLASTY;  Surgeon: [name], MD;  Location: [facility];  Service: Orthopedics;  Laterality: Left;  . TOTAL KNEE ARTHROPLASTY Right 05/06/2014   Procedure: RIGHT TOTAL KNEE ARTHROPLASTY;  Surgeon: [name], MD;  Location: [facility];  Service: Orthopedics;  Laterality: Right;   Social History:  reports that he has quit smoking. He started smoking about 34 years ago. He has never used smokeless tobacco. He reports that he drinks about 0.6 oz of alcohol per week . He reports that he does not use drugs.  Family / Support Systems Marital Status: Married Patient Roles: Spouse, Parent, Other (Comment) (retiree) Spouse/Significant Other: Mardene Celeste [phone]  [phone] Children: Verdene Lennert Phelan-daughter [phone]-cell Other Supports: Friends, Church members Anticipated Caregiver: wife Ability/Limitations of Caregiver: Wife can not physically assist Caregiver Availability: 24/7 Family Dynamics: Close knit with family and was very active prior to admission here. His wife reports he has always been very active and  it is hard for him to sit still now he is feeling better. Pt wants to be as independent as posible before leaving here.  Social History Preferred language: English Religion: Catholic Cultural Background: No issues Education: Secretary/administrator educated Read: Yes Write: Yes Employment Status: Retired Freight forwarder Issues: No issues Guardian/Conservator: None-according to MD pt  is capable of making his own decisions while here. Wife is here often and pt wants her included in the plans.   Abuse/Neglect Physical Abuse: Denies Verbal Abuse: Denies Sexual Abuse: Denies Exploitation of patient/patient's resources: Denies Self-Neglect: Denies  Emotional Status Pt's affect, behavior adn adjustment status: Pt is motivated to regain his strength and recover from the surgeries. He is moving forward and feels he is on the right track now that he is here on rehab. His wife reports she can see a difference since he came here on Sat. he has always been very active and hopes this will help in his recovering. Recent Psychosocial Issues: other health issues were managed prior to admission Pyschiatric History: No issues-deferred depression screening due to pt coping appropriately and doing well. Able to verbalize and is looking forward to his stay here. Substance Abuse History: None  Patient / Family Perceptions, Expectations & Goals Pt/Family understanding of illness & functional limitations: Pt and wife can explain his surgeries and his recovery. He has always been one who is positive and forward thinking. They talk with the MD and feel they have a good understanding of his treatment course. Premorbid pt/family roles/activities: Husband, father, grandfather, church member, gym member, etc Anticipated changes in roles/activities/participation: resume Pt/family expectations/goals: Pt states: " I want to be able to d as much as I can for myself before I leave here, that is my plan anyway."  Wife states: " I hope he is doing well I can not physically assist him with my opwn health issues."  [employer]: Other (Comment) (had in past) Premorbid Home Care/DME Agencies: Other (Comment) (had in past) Transportation available at discharge: Wife  Discharge Planning Living Arrangements: Spouse/significant other Support Systems: Spouse/significant other, Children,  Friends/neighbors, Social worker community Type of Residence: Private residence Insurance Resources: [employer], Multimedia programmer (specify) Archivist) Financial Screen Referred: No Living Expenses: Own Money Management: Spouse, Patient Does the patient have any problems obtaining your medications?: No Home Management: Both do home management together Patient/Family Preliminary Plans: Return home with wife who can provide supervision level only due to her own health issues. Pt is fairly high level and should reach supervision versus mod/i level. Hopefully will not have to go home with the drains but aware may need too.  Wife plans to be here daily and watch his progress and see what the RN's do for the drains in case she will need to managhe them at home. Social Work Anticipated Follow Up Needs: HH/OP  Clinical Impression Pleasant gentleman who was very active prior to admission to keep himself in shape. His wife is very supportive and willing to assist him at discharge with the drains and antibiotics if necessary. Wife plans to be here daily to see his progress. Pt should do well and only be here a short time. Will work with on discharge planning.  Elease Hashimoto 05/15/2017, 12:20 PM

## 2017-05-15 NOTE — Progress Notes (Signed)
Occupational Therapy Session Note  Patient Details  Name: MCCRAE SPECIALE MRN: [id number] Date of Birth: 1934/04/24  Today's Date: 05/15/2017 OT Individual Time:  -  0830  0945   75 mins   Short Term Goals: Week 1:  OT Short Term Goal 1 (Week 1): STGs=LTGs 2/2 ELOS  OT Intervention:   Pt propelled wc from room to gym with 4 rest breaks. Educated pt on abdominal breathing when moving wc.  Stand pivot transfer from wc to mat with max assist with sit to stand and mod assist with rotation to sit.  Engaged in sit to stand x 10 with min assist from high surface with no hands.  Sitting balance with legs crossed with ball toss for 2 min on each leg. Upper extremity AROM with ball toss with dynamic sitting with minimal challenges.  No LOB.   Pt practiced Tai 3 times a week prior to admission to hospital.    Exited session with Pt seated in w/c beside bed and wife present.     Therapy Documentation Precautions:  Precautions Precautions: Fall Precaution Comments: B Nephrostomy tubes Restrictions Weight Bearing Restrictions: No General:   Vital Signs: Therapy Vitals Temp: 97.8 F (36.6 C) Temp Source: Oral Pulse Rate: 75 Resp: 18 BP: 140/74 Patient Position (if appropriate): Lying Oxygen Therapy SpO2: 99 % O2 Device: Not Delivered Pain:  none       See Function Navigator for Current Functional Status.   Therapy/Group: Individual Therapy  Lisa Roca 05/15/2017, 7:15 AM

## 2017-05-15 NOTE — Progress Notes (Signed)
Initial Nutrition Assessment  DOCUMENTATION CODES:   Severe malnutrition in context of chronic illness  INTERVENTION:   Recommend nutrition support via Cortrak Tube if patient continues to meet <75% estimated needs in the next 2-3 days.   Ensure Enlive po BID, each supplement provides 350 kcal and 20 grams of protein  Renal MVI  NUTRITION DIAGNOSIS:   Malnutrition (severe) related to chronic illness, poor appetite (renal disease and DM) as evidenced by 12 percent weight loss in 4 months, severe depletion of muscle mass, severe depletion of body fat.  GOAL:   Patient will meet greater than or equal to 90% of their needs  MONITOR:   PO intake, Supplement acceptance, Labs, Weight trends, I & O's  REASON FOR ASSESSMENT:   Malnutrition Screening Tool    ASSESSMENT:   83 y.o. male who is admitted for CIR with debility secondary to acute renal failure.  He is status post stenting on 05/09/2017 for bilateral hydronephrosis; he is status post bilateral nephrostomy tubes placed 05/10/2017.     Met with pt in room today. RD familiar with this pt from last week while pt was  hospitalized on the medical floor. Pt has continued to have poor appetite and oral intake for his entire hospital stay. Pt is currently eating <25% of meals and drinking 1-2 Ensure/Glucerna/Nepro per day. Pt is ordered for Ensure, but he can have whatever supplement he requests. Per chart, pt has lost 28lbs(12%) in 4 months; this is severe. Pt has continued to lose weight while hospitalized. RD discussed with pt the importance of adequate nutrition for recovery. RD encouraged pt to eat his meals and drink at least 2 supplements per day. Pt complains of taste changes and reports that food tastes metallic. If patient continues to eat poorly, nutrition support is recommended via cortrak tube to help pt meet estimated needs.    Medications reviewed and include: insulin, MVI, ceftriaxone  Labs reviewed: BUN 27(H), creat  1.39(H), alb 2.5(L), P 2.9- 6/16 Wbc- 19.7(H) cbgs- 177, 170 x 48hrs   Nutrition-Focused physical exam completed. Findings are severe fat depletions in chest and upper arms, moderate to severe muscle depletions over entire body, and mild generalized edema.   Diet Order:  Diet heart healthy/carb modified Room service appropriate? Yes; Fluid consistency: Thin  Skin:  Wound (see comment) (perineum incision )  Last BM:  6/14  Height:   Ht Readings from Last 1 Encounters:  05/13/17 6' 1.5" (1.867 m)    Weight:   Wt Readings from Last 1 Encounters:  05/15/17 200 lb (90.7 kg)    Ideal Body Weight:  85 kg  BMI:  Body mass index is 26.03 kg/m.  Estimated Nutritional Needs:   Kcal:  2300-2600kcal/day   Protein:  100-118g/day   Fluid:  >2.3L/day   EDUCATION NEEDS:   Education needs addressed  Koleen Distance MS, RD, LDN Pager #[phone]

## 2017-05-15 NOTE — Progress Notes (Signed)
Physical Therapy Session Note  Patient Details  Name: William [name] MRN: [id number] Date of Birth: 1934-06-16  Today's Date: 05/15/2017 PT Individual Time: 1300-1400 PT Individual Time Calculation (min): 60 min   Short Term Goals: Week 1:  PT Short Term Goal 1 (Week 1): Pt will increasebed mobility to S.  PT Short Term Goal 2 (Week 1): Pt will increase transfers bed to chair to S.  PT Short Term Goal 3 (Week 1): Pt will ambulate with rolling walker and S about 150 feet. PT Short Term Goal 4 (Week 1): Pt will ascend/descend 2 step x 2 with rolling walker.   Skilled Therapeutic Interventions/Progress Updates:   Pt in bed upon arrival, agreeable to PT session. Bed mobility: min assist with supine>sitting with assist at trunk. Transfers: sit<>stand min assist X2 and mod assist X3 during session. Performed from bed, w/c, and 3:1. Ambulation: 20 ft X1, 40 ft X1, 12 ft X2 - using rw and min assist. W/C-pt propelling w/c to family room with rest breaks as needed. Balance: working on static standing balance with double, single and no UE support. Pt did request to use bathroom during session for BM- pt able to perform hygiene without assistance. Standing at sink to wash hands with elbows for support on counter. SpO2 in 90s throughout session, cues for breathing as needed. Following session, pt sitting in w/c with al needs in reach and family present.   Therapy Documentation Precautions:  Precautions Precautions: Fall Precaution Comments: B Nephrostomy tubes Restrictions Weight Bearing Restrictions: No  Pain: denied  See Function Navigator for Current Functional Status.   Therapy/Group: Individual Therapy  Linard Millers, PT 05/15/2017, 1:45 PM

## 2017-05-15 NOTE — Care Management Note (Signed)
Inpatient [facility] Individual Statement of Services  Patient Name:  William [name]  Date:  05/15/2017  Welcome to the [facility].  Our goal is to provide you with an individualized program based on your diagnosis and situation, designed to meet your specific needs.  With this comprehensive rehabilitation program, you will be expected to participate in at least 3 hours of rehabilitation therapies Monday-Friday, with modified therapy programming on the weekends.  Your rehabilitation program will include the following services:  Physical Therapy (PT), Occupational Therapy (OT), 24 hour per day rehabilitation nursing, Case Management (Social Worker), Rehabilitation Medicine, Nutrition Services and Pharmacy Services  Weekly team conferences will be held on Wednesday to discuss your progress.  Your Social Worker will talk with you frequently to get your input and to update you on team discussions.  Team conferences with you and your family in attendance may also be held.  Expected length of stay: 7-10 days  Overall anticipated outcome: mod/i level  Depending on your progress and recovery, your program may change. Your Social Worker will coordinate services and will keep you informed of any changes. Your Social Worker's name and contact numbers are listed  below.  The following services may also be recommended but are not provided by the [facility] will be made to provide these services after discharge if needed.  Arrangements include referral to agencies that provide these services.  Your insurance has been verified to be:  Medicare & [employer] Your primary doctor is:  Jani Gravel  Pertinent information will be shared with your doctor and your insurance company.  Social Worker:  Ovidio Kin, [place] or [phone]  Information discussed with and copy given to patient by: Elease Hashimoto, 05/15/2017, 12:04 PM

## 2017-05-15 NOTE — Progress Notes (Signed)
[facility] PHYSICAL MEDICINE & REHABILITATION     PROGRESS NOTE  Subjective/Complaints:  Pt seen sitting up in his chair this AM.  He slept well overnight and had a good first day of therapies. Daughter with several questions.  ROS: Denies CP, SOB, N/V/D.  Objective: Vital Signs: Blood pressure 140/74, pulse 75, temperature 97.8 F (36.6 C), temperature source Oral, resp. rate 18, height 6' 1.5" (1.867 m), weight 90.7 kg (200 lb), SpO2 99 %. No results found.  Recent Labs  05/13/17 0753 05/15/17 0715  WBC 19.4* 19.7*  HGB 12.0* 13.3  HCT 37.8* 41.2  PLT 219 291    Recent Labs  05/13/17 0753 05/15/17 0715  NA 141 139  K 4.3 4.3  CL 107 103  GLUCOSE 177* 170*  BUN 34* 27*  CREATININE 1.47* 1.39*  CALCIUM 8.8* 8.9   CBG (last 3)   Recent Labs  05/14/17 1638 05/14/17 2103 05/15/17 0625  GLUCAP 154* 174* 144*    Wt Readings from Last 3 Encounters:  05/15/17 90.7 kg (200 lb)  05/12/17 92.5 kg (204 lb)  01/05/17 104.3 kg (230 lb)    Physical Exam:  BP 140/74 (BP Location: Right Arm)   Pulse 75   Temp 97.8 F (36.6 C) (Oral)   Resp 18   Ht 6' 1.5" (1.867 m)   Wt 90.7 kg (200 lb)   SpO2 99%   BMI 26.03 kg/m  Constitutional: He appears well-developed. Vitalsreviewed. NAD. HENT: Normocephalic. Atraumatic Eyes: EOMare normal. No discharge.  Cardiovascular: Irregularly irregular Respiratory: Effort normaland breath sounds normal.  GI: Soft. Bowel sounds are normal.  Neurological: He is alert.  He does show some delay in processing.  HOH Motor: Grossly 5/5 b/l UE B/l LE 4+/5 RLE, 5/5 LLE Sensation intact to light touch Skin. Warm and dry  Assessment/Plan: 1. Functional deficits secondary to bilateral hydronephrosis status post stenting 05/09/2017 as well as bilateral nephrostomy tubes which require 3+ hours per day of interdisciplinary therapy in a comprehensive inpatient rehab setting. Physiatrist is providing close team supervision and 24 hour  management of active medical problems listed below. Physiatrist and rehab team continue to assess barriers to discharge/monitor patient progress toward functional and medical goals.  Function:  Bathing Bathing position   Position: Shower  Bathing parts Body parts bathed by patient: Right arm, Left arm, Chest, Abdomen, Front perineal area, Buttocks, Right upper leg, Left upper leg, Right lower leg, Left lower leg Body parts bathed by helper: Back  Bathing assist Assist Level: Touching or steadying assistance(Pt > 75%)      Upper Body Dressing/Undressing Upper body dressing   What is the patient wearing?: Pull over shirt/dress     Pull over shirt/dress - Perfomed by patient: Thread/unthread left sleeve, Put head through opening, Pull shirt over trunk          Upper body assist Assist Level: Supervision or verbal cues      Lower Body Dressing/Undressing Lower body dressing   What is the patient wearing?: Underwear, Pants, Non-skid slipper socks Underwear - Performed by patient: Thread/unthread right underwear leg, Thread/unthread left underwear leg, Pull underwear up/down   Pants- Performed by patient: Thread/unthread right pants leg, Thread/unthread left pants leg, Pull pants up/down     Non-skid slipper socks- Performed by helper: Don/doff right sock, Don/doff left sock                  Lower body assist Assist for lower body dressing: Touching or steadying assistance (Pt >  75%)      Toileting Toileting Toileting activity did not occur: No continent bowel/bladder event Toileting steps completed by patient: Adjust clothing prior to toileting, Adjust clothing after toileting Toileting steps completed by helper: Performs perineal hygiene, Adjust clothing after toileting, Adjust clothing prior to toileting    Toileting assist     Transfers Chair/bed transfer   Chair/bed transfer method: Stand pivot Chair/bed transfer assist level: Moderate assist (Pt 50 - 74%/lift  or lower) Chair/bed transfer assistive device: Armrests     Locomotion Ambulation     Max distance: 25 Assist level: Moderate assist (Pt 50 - 74%)   Wheelchair   Type: Manual Max wheelchair distance: 50 Assist Level: Supervision or verbal cues  Cognition Comprehension Comprehension assist level: Follows complex conversation/direction with extra time/assistive device  Expression Expression assist level: Expresses basic needs/ideas: With extra time/assistive device  Social Interaction Social Interaction assist level: Interacts appropriately 90% of the time - Needs monitoring or encouragement for participation or interaction.  Problem Solving Problem solving assist level: Solves basic 50 - 74% of the time/requires cueing 25 - 49% of the time  Memory Memory assist level: Recognizes or recalls 25 - 49% of the time/requires cueing 50 - 75% of the time    Medical Problem List and Plan: 1. Debilitation secondary to acute renal failure with bilateral hydronephrosis status post stenting 05/09/2017 as well as bilateral nephrostomy tubes 05/10/2017  Cont CIR  Notes reviewed. 2. DVT Prophylaxis/Anticoagulation:Chronic Eliquis 3. Pain Management: Tylenol 4. Mood/delirium: Trazodone 50 mg daily at bedtime, Ativan 1 mg as needed anxiety/sleep 5. Neuropsych: This patient iscapable of making decisions on hisown behalf. 6. Skin/Wound Care: Routine skin checks 7. Fluids/Electrolytes/Nutrition: Routine I&Os 8.AKI/hypernatremiafelt to be secondary to obstruction.Follow-up renal services.  Cr 1.39 on 6/18  Cont to monitor 9.Hypertension. Norvasc 5 mg daily at bedtime  Relatively controlled 6/18 10.Atrial fibrillation.Eliquisresumed. Cardiac rate control 11.Coag-negative staph UTI.   Blood cultures NG.   Repeat urine culture NG.   IV rocephin- will continue in hospital, will inquire about length of tx 12.Type 2 diabetes mellitus. Hemoglobin A1c 6.8. Sliding scale insulin. Patient on  Levemir 16 units daily at bedtime prior to admission  Relatively controlled 6/18 13.History of prostate cancer and radiation therapy 14.Hyperlipidemia. Pravachol 15. Hypoalbuminemia  Supplement initiated  16. Leukocytosis  WBCs 19.7 on 6/18  Afrebrile  Cont to monitor  CXR ordered   >35 minutes spent with >20 minutes with patient and daughter discussion drains, dressing, appetite, etc.   LOS (Days) 2 A FACE TO FACE EVALUATION WAS PERFORMED  Ankit Lorie Phenix 05/15/2017 9:03 AM

## 2017-05-16 ENCOUNTER — Inpatient Hospital Stay (HOSPITAL_COMMUNITY): Payer: Medicare Other | Admitting: Occupational Therapy

## 2017-05-16 ENCOUNTER — Inpatient Hospital Stay (HOSPITAL_COMMUNITY): Payer: Medicare Other | Admitting: Physical Therapy

## 2017-05-16 LAB — GLUCOSE, CAPILLARY
GLUCOSE-CAPILLARY: 151 mg/dL — AB (ref 65–99)
Glucose-Capillary: 172 mg/dL — ABNORMAL HIGH (ref 65–99)
Glucose-Capillary: 104 mg/dL — ABNORMAL HIGH (ref 65–99)
Glucose-Capillary: 193 mg/dL — ABNORMAL HIGH (ref 65–99)

## 2017-05-16 MED ORDER — INSULIN DETEMIR 100 UNIT/ML ~~LOC~~ SOLN
6.0000 [IU] | Freq: Every day | SUBCUTANEOUS | Status: DC
  Administered 2017-05-16 – 2017-05-26 (×11): 6 [IU] via SUBCUTANEOUS
  Filled 2017-05-16 (×11): qty 0.06

## 2017-05-16 NOTE — Progress Notes (Signed)
Physical Therapy Session Note  Patient Details  Name: William [name] MRN: [id number] Date of Birth: Nov 01, 1934  Today's Date: 05/16/2017 PT Individual Time: 1330-1415 PT Individual Time Calculation (min): 45 min   Short Term Goals: Week 1:  PT Short Term Goal 1 (Week 1): Pt will increasebed mobility to S.  PT Short Term Goal 2 (Week 1): Pt will increase transfers bed to chair to S.  PT Short Term Goal 3 (Week 1): Pt will ambulate with rolling walker and S about 150 feet. PT Short Term Goal 4 (Week 1): Pt will ascend/descend 2 step x 2 with rolling walker.   Skilled Therapeutic Interventions/Progress Updates: Pt presented in bed asleep but easily aroused and agreeable to therapy. Pt with minA donning brief and shorts. Performed supine to sit with supervision and additional time. Sit to stand minA with cues from bed. Pt ambulated to bathroom with supervision and performed toiletiing with supervision. Ambulated to sink and washed face with single UE support. Pt ambulated 167f and 942fwith seated rest to rehab gym. Pt encouraged to maintain breathing while ambulating with SpO2 after ambulation 97%. Pt required therapeutic break after ambulation due to fatigue.  Performed NuSpet L1 x 10 min with supervision transfers. Pt maintained 35-45 steps per min with no rest breaks and 13 on Borg exertion scale. Pt returned to room via w/c due to fatigue and remained in w/c at end of session with wife present and needs met.      Therapy Documentation Precautions:  Precautions Precautions: Fall Precaution Comments: B Nephrostomy tubes Restrictions Weight Bearing Restrictions: No General:   Vital Signs: Therapy Vitals Temp: 98.1 F (36.7 C) Temp Source: Oral Pulse Rate: 68 Resp: 18 BP: (!) 141/82 Patient Position (if appropriate): Lying Oxygen Therapy SpO2: 90 % O2 Device: Not Delivered   See Function Navigator for Current Functional Status.   Therapy/Group: Individual Therapy  Addison Freimuth  Jaylene Arrowood, PTA  05/16/2017, 4:18 PM

## 2017-05-16 NOTE — Progress Notes (Signed)
PHYSICAL MEDICINE & REHABILITATION     PROGRESS NOTE  Subjective/Complaints:  Pt seen sitting up in his chair this AM. Another daughter at bedside with several questions.  Patient slept fairly overnight after receiving medications.   ROS: Denies CP, SOB, N/V/D.  Objective: Vital Signs: Blood pressure 134/68, pulse 73, temperature 97.6 F (36.4 C), temperature source Oral, resp. rate 18, height 6' 1.5" (1.867 m), weight 90.6 kg (199 lb 11.2 oz), SpO2 97 %. Dg Chest 2 View  Result Date: 05/15/2017 CLINICAL DATA:  Chest pain and weakness. EXAM: CHEST  2 VIEW COMPARISON:  CT chest 04/14/2017 and chest radiograph 10/27/2014. FINDINGS: Trachea is midline. Heart size stable. Lungs are clear. Possible tiny bilateral pleural effusions. Flowing anterior osteophytosis in the thoracic spine. IMPRESSION: Possible tiny bilateral pleural effusions. Electronically Signed   By: Lorin Picket M.D.   On: 05/15/2017 12:22    Recent Labs  05/15/17 0715  WBC 19.7*  HGB 13.3  HCT 41.2  PLT 291    Recent Labs  05/15/17 0715  NA 139  K 4.3  CL 103  GLUCOSE 170*  BUN 27*  CREATININE 1.39*  CALCIUM 8.9   CBG (last 3)   Recent Labs  05/15/17 1650 05/15/17 2130 05/16/17 0623  GLUCAP 182* 139* 151*    Wt Readings from Last 3 Encounters:  05/16/17 90.6 kg (199 lb 11.2 oz)  05/12/17 92.5 kg (204 lb)  01/05/17 104.3 kg (230 lb)    Physical Exam:  BP 134/68 (BP Location: Left Arm)   Pulse 73   Temp 97.6 F (36.4 C) (Oral)   Resp 18   Ht 6' 1.5" (1.867 m)   Wt 90.6 kg (199 lb 11.2 oz)   SpO2 97%   BMI 25.99 kg/m  Constitutional: He appears well-developed. Vitalsreviewed. NAD. HENT: Normocephalic. Atraumatic Eyes: EOMare normal. No discharge.  Cardiovascular: IRIR Respiratory: Effort normaland breath sounds normal.  GI: Soft. Bowel sounds are normal.  Neurological: He is alert.  He does show some delay in processing.  HOH Motor: Grossly 5/5 b/l UE B/l LE 4+/5  RLE, 5/5 LLE (stable) Sensation intact to light touch Skin. Warm and dry  Assessment/Plan: 1. Functional deficits secondary to bilateral hydronephrosis status post stenting 05/09/2017 as well as bilateral nephrostomy tubes which require 3+ hours per day of interdisciplinary therapy in a comprehensive inpatient rehab setting. Physiatrist is providing close team supervision and 24 hour management of active medical problems listed below. Physiatrist and rehab team continue to assess barriers to discharge/monitor patient progress toward functional and medical goals.  Function:  Bathing Bathing position   Position: Shower  Bathing parts Body parts bathed by patient: Right arm, Left arm, Chest, Abdomen, Front perineal area, Buttocks, Right upper leg, Left upper leg, Right lower leg, Left lower leg Body parts bathed by helper: Back  Bathing assist Assist Level: Touching or steadying assistance(Pt > 75%)      Upper Body Dressing/Undressing Upper body dressing   What is the patient wearing?: Pull over shirt/dress     Pull over shirt/dress - Perfomed by patient: Thread/unthread left sleeve, Put head through opening, Pull shirt over trunk          Upper body assist Assist Level: Supervision or verbal cues      Lower Body Dressing/Undressing Lower body dressing   What is the patient wearing?: Underwear, Pants, Non-skid slipper socks Underwear - Performed by patient: Thread/unthread right underwear leg, Thread/unthread left underwear leg, Pull underwear up/down   Pants- Performed  by patient: Thread/unthread right pants leg, Thread/unthread left pants leg, Pull pants up/down     Non-skid slipper socks- Performed by helper: Don/doff right sock, Don/doff left sock                  Lower body assist Assist for lower body dressing: Touching or steadying assistance (Pt > 75%)      Toileting Toileting Toileting activity did not occur: No continent bowel/bladder event Toileting  steps completed by patient: Performs perineal hygiene Toileting steps completed by helper: Adjust clothing prior to toileting, Adjust clothing after toileting, Performs perineal hygiene Toileting Assistive Devices: Grab bar or rail  Toileting assist     Transfers Chair/bed transfer   Chair/bed transfer method: Ambulatory Chair/bed transfer assist level: Touching or steadying assistance (Pt > 75%) Chair/bed transfer assistive device: Armrests, Medical sales representative     Max distance: 40 ft Assist level: Touching or steadying assistance (Pt > 75%)   Wheelchair   Type: Manual Max wheelchair distance: 200 ft Assist Level: Supervision or verbal cues  Cognition Comprehension Comprehension assist level: Understands basic 90% of the time/cues < 10% of the time  Expression Expression assist level: Expresses basic needs/ideas: With no assist  Social Interaction Social Interaction assist level: Interacts appropriately 90% of the time - Needs monitoring or encouragement for participation or interaction.  Problem Solving Problem solving assist level: Solves basic 75 - 89% of the time/requires cueing 10 - 24% of the time  Memory Memory assist level: Recognizes or recalls 25 - 49% of the time/requires cueing 50 - 75% of the time    Medical Problem List and Plan: 1. Debilitation secondary to acute renal failure with bilateral hydronephrosis status post stenting 05/09/2017 as well as bilateral nephrostomy tubes 05/10/2017  Cont CIR 2. DVT Prophylaxis/Anticoagulation:Chronic Eliquis 3. Pain Management: Tylenol 4. Mood/delirium: Trazodone 50 mg daily at bedtime, Ativan 1 mg as needed anxiety/sleep 5. Neuropsych: This patient iscapable of making decisions on hisown behalf. 6. Skin/Wound Care: Routine skin checks 7. Fluids/Electrolytes/Nutrition: Routine I&Os  Diet liberalized per daughter request 8.AKI/hypernatremiafelt to be secondary to obstruction.Follow-up renal  services.  Cr 1.39 on 6/18  Cont to monitor 9.Hypertension. Norvasc 5 mg daily at bedtime  Controlled 6/19 10.Atrial fibrillation.Eliquisresumed. Cardiac rate control 11.Coag-negative staph UTI.   Blood cultures NG.   Repeat urine culture NG.   IV rocephin- will continue in hospital, will inquire about length of tx 12.Type 2 diabetes mellitus. Hemoglobin A1c 6.8. Sliding scale insulin.   Levemir 6 units started on 6/19 (home 16 units) 13.History of prostate cancer and radiation therapy 14.Hyperlipidemia. Pravachol 15. Hypoalbuminemia  Supplement initiated  16. Leukocytosis  WBCs 19.7 on 6/18  Afrebrile  Cont to monitor  CXR reviewed 6/18, relatively unremarkable   >35 minutes spent with >20 minutes with patient and other daughter discussion drains, appetite, abx, diet, etc.   LOS (Days) 3 A FACE TO FACE EVALUATION WAS PERFORMED  Keyle Doby Lorie Phenix 05/16/2017 8:48 AM

## 2017-05-16 NOTE — Progress Notes (Signed)
Occupational Therapy Session Note  Patient Details  Name: RAHKEEM SENFT MRN: [id number] Date of Birth: 1934/07/31  Today's Date: 05/16/2017 OT Individual Time: 1450-1545 OT Individual Time Calculation (min): 55 min    Short Term Goals: Week 1:  OT Short Term Goal 1 (Week 1): STGs=LTGs 2/2 ELOS  Skilled Therapeutic Interventions/Progress Updates:    OT treatment session focused on activity tolerance and standing balance/endurance. Pt able to achieve figure 4 position seated in wc to doff non-skid socks, then don regular socks. Assistance to don shoes and fasten shoes for time management. Pt propelled wc in hallway ~ 30 ft using BUEs and B LEs with 3 rest breaks. Pt completed meaningful painting activity in sitting and standing. Pt tolerated 2.5 mins, then 1.5 mins with extended rest breaks in between. Pt returned to room at end of session and left seated in wc with family present and needs met.   Therapy Documentation Precautions:  Precautions Precautions: Fall Precaution Comments: B Nephrostomy tubes Restrictions Weight Bearing Restrictions: No Pain: Pain Assessment Pain Assessment: No/denies pain  See Function Navigator for Current Functional Status.   Therapy/Group: Individual Therapy  Valma Cava 05/16/2017, 3:26 PM

## 2017-05-16 NOTE — Progress Notes (Signed)
Occupational Therapy Session Note  Patient Details  Name: William [name] MRN: [id number] Date of Birth: 06-Aug-1934  Today's Date: 05/16/2017 OT Individual Time: 1003-1100 OT Individual Time Calculation (min): 57 min    Short Term Goals: Week 1:  OT Short Term Goal 1 (Week 1): STGs=LTGs 2/2 ELOS  Skilled Therapeutic Interventions/Progress Updates:    Pt transferred from wheelchair to shower seat with mod assist using the RW for support.  Bathing sitting on the shower seat with supervision.  Mod instructional cueing for thoroughness and for using more soap.  Mod assist for sit to stand with mod assist also needed for thoroughness when washing peri area.  Once he completed shower, he finished drying off sitting on the seat before ambulating out to the bed. On EOB he applied deodorant, and pullover shirt with setup.  He also combed his hair.  Pt's spouse wanted him to rest in bed on his side so barrier cream could be applied to his buttocks and his buttocks could air out secondary to skin breakdown.  Mod assist for transition to supine from sitting, in order to bring both LEs into the bed.  Pt left laying on his left side with spouse present and call button in reach.    Therapy Documentation Precautions:  Precautions Precautions: Fall Precaution Comments: B Nephrostomy tubes Restrictions Weight Bearing Restrictions: No  Pain: Pain Assessment Pain Assessment: No/denies pain Pain Score: 2  Pain Location: Leg Pain Orientation:  (both) Pain Descriptors / Indicators: Aching Pain Intervention(s): Repositioned ADL: See Function Navigator for Current Functional Status.   Therapy/Group: Individual Therapy  Icel Castles OTR/L 05/16/2017, 12:13 PM

## 2017-05-16 NOTE — Progress Notes (Signed)
Patient information reviewed and entered into eRehab system by Demetri Kerman, RN, CRRN, PPS Coordinator.  Information including medical coding and functional independence measure will be reviewed and updated through discharge.     Per nursing patient was given "Data Collection Information Summary for Patients in Inpatient Rehabilitation Facilities with attached "Privacy Act Statement-Health Care Records" upon admission.  

## 2017-05-16 NOTE — Progress Notes (Addendum)
Family brought home CPAP for pt use while admitted. Machine and wires checked by RT for frays, and machine plugged into red outlet. Pt placed on home CPAP with home mask/tubing. SpO2 was around 81% on RA, so 2L O2 bled in thru CPAP. Pt tolerating at this time. RT will continue to monitor.

## 2017-05-16 NOTE — Progress Notes (Signed)
Physical Therapy Session Note  Patient Details  Name: William [name] MRN: [id number] Date of Birth: 1934/02/06  Today's Date: 05/16/2017 PT Individual Time: 0800-0900 PT Individual Time Calculation (min): 60 min   Short Term Goals: Week 1:  PT Short Term Goal 1 (Week 1): Pt will increasebed mobility to S.  PT Short Term Goal 2 (Week 1): Pt will increase transfers bed to chair to S.  PT Short Term Goal 3 (Week 1): Pt will ambulate with rolling walker and S about 150 feet. PT Short Term Goal 4 (Week 1): Pt will ascend/descend 2 step x 2 with rolling walker.   Skilled Therapeutic Interventions/Progress Updates:   Pt in recliner chair upon arrival with daughter Wendelyn Breslow) present, agreeable to PT session. Transfers: sit<>stand min-mod assist. Progressing to min assist when practicing repetitions in gym. Emphasis on sequence with repetitions. Transfers performed from recliner, BSC, w/c. Ambulation: 15 ft X2, 90 ft x1, 50 ft X1 using rw and min assist. W/C propelling w/c in halls with supervision. Cues for parts management. Pt assisted to bathroom at beginning of session for BM. Pt able to perform hygiene without assist. Washing hands at sink with min assist for balance. Following session, pt returned to room, up in w/c with all needs in reach and family present.   Therapy Documentation Precautions:  Precautions Precautions: Fall Precaution Comments: B Nephrostomy tubes Restrictions Weight Bearing Restrictions: No General:   Vital Signs: Therapy Vitals Temp: 97.6 F (36.4 C) Temp Source: Oral Pulse Rate: 73 Resp: 18 BP: 134/68 Patient Position (if appropriate): Lying Oxygen Therapy SpO2: 97 % O2 Device: Not Delivered Pain:   Mobility:   Locomotion :    Trunk/Postural Assessment :    Balance:   Exercises:   Other Treatments:     See Function Navigator for Current Functional Status.   Therapy/Group: Individual Therapy  Linard Millers, PT 05/16/2017, 8:18 AM

## 2017-05-17 ENCOUNTER — Inpatient Hospital Stay (HOSPITAL_COMMUNITY): Payer: Medicare Other | Admitting: Occupational Therapy

## 2017-05-17 ENCOUNTER — Inpatient Hospital Stay (HOSPITAL_COMMUNITY): Payer: Medicare Other | Admitting: Physical Therapy

## 2017-05-17 DIAGNOSIS — D62 Acute posthemorrhagic anemia: Secondary | ICD-10-CM

## 2017-05-17 DIAGNOSIS — R7309 Other abnormal glucose: Secondary | ICD-10-CM

## 2017-05-17 LAB — GLUCOSE, CAPILLARY
GLUCOSE-CAPILLARY: 141 mg/dL — AB (ref 65–99)
Glucose-Capillary: 117 mg/dL — ABNORMAL HIGH (ref 65–99)
Glucose-Capillary: 204 mg/dL — ABNORMAL HIGH (ref 65–99)
Glucose-Capillary: 147 mg/dL — ABNORMAL HIGH (ref 65–99)

## 2017-05-17 LAB — CBC
HEMATOCRIT: 37.6 % — AB (ref 39.0–52.0)
Hemoglobin: 12 g/dL — ABNORMAL LOW (ref 13.0–17.0)
MCH: 29.6 pg (ref 26.0–34.0)
MCHC: 31.9 g/dL (ref 30.0–36.0)
MCV: 92.8 fL (ref 78.0–100.0)
PLATELETS: 366 10*3/uL (ref 150–400)
RBC: 4.05 MIL/uL — ABNORMAL LOW (ref 4.22–5.81)
RDW: 14.5 % (ref 11.5–15.5)
WBC: 15.3 10*3/uL — ABNORMAL HIGH (ref 4.0–10.5)

## 2017-05-17 LAB — BASIC METABOLIC PANEL
Anion gap: 7 (ref 5–15)
BUN: 25 mg/dL — AB (ref 6–20)
CO2: 30 mmol/L (ref 22–32)
CREATININE: 1.32 mg/dL — AB (ref 0.61–1.24)
Calcium: 8.8 mg/dL — ABNORMAL LOW (ref 8.9–10.3)
Chloride: 99 mmol/L — ABNORMAL LOW (ref 101–111)
GFR calc Af Amer: 57 mL/min — ABNORMAL LOW (ref >60)
GFR calc non Af Amer: 49 mL/min — ABNORMAL LOW (ref >60)
GLUCOSE: 143 mg/dL — AB (ref 65–99)
Potassium: 3.9 mmol/L (ref 3.5–5.1)
Sodium: 136 mmol/L (ref 135–145)

## 2017-05-17 MED ORDER — GLUCERNA SHAKE PO LIQD
237.0000 mL | Freq: Three times a day (TID) | ORAL | Status: DC
  Administered 2017-05-17 – 2017-05-26 (×23): 237 mL via ORAL

## 2017-05-17 MED ORDER — GLUCERNA PO LIQD: 237.0000 mL | Freq: Two times a day (BID) | ORAL | Status: DC

## 2017-05-17 MED ORDER — GERHARDT'S BUTT CREAM
TOPICAL_CREAM | Freq: Four times a day (QID) | CUTANEOUS | Status: DC
  Administered 2017-05-17 – 2017-05-18 (×6): via TOPICAL
  Administered 2017-05-18: 1 via TOPICAL
  Administered 2017-05-18 – 2017-05-19 (×4): via TOPICAL
  Filled 2017-05-17: qty 1

## 2017-05-17 NOTE — Progress Notes (Signed)
Occupational Therapy Session Note  Patient Details  Name: William [name] MRN: [id number] Date of Birth: [birth year]  Today's Date: 05/17/2017 OT Individual Time: 9702-6378 OT Individual Time Calculation (min): 59 min    Short Term Goals: Week 1:  OT Short Term Goal 1 (Week 1): STGs=LTGs 2/2 ELOS  Skilled Therapeutic Interventions/Progress Updates:    Pt completed bathing and dressing during the session.  Mod assist for mobility to the walk-in shower using the RW for support.  He was able to complete bathing sit to stand with mod assist.  Noted bowel incontinence with sit to stand from shower seat.  Had pt transfer over to the 3:1 over the toilet for toileting tasks.  With exertion, also noted light bleeding coming from his penis, which nursing is already aware of.  He completed dressing sit to stand from the wheelchair with mod assist.  Supervision for donning pullover shirt with supervision for threading LB items and mod assist to complete sit to stand in order to pull them up over his hips.  Finished session with pt in wheelchair with daughter and his spouse present.  Pt plans to complete shaving prior to next PT session, from wheelchair level.    Therapy Documentation Precautions:  Precautions Precautions: Fall Precaution Comments: B Nephrostomy tubes Restrictions Weight Bearing Restrictions: No  Pain: Pain Assessment Pain Assessment: No/denies pain ADL: See Function Navigator for Current Functional Status.   Therapy/Group: Individual Therapy  Ashtyn Freilich OTR/L 05/17/2017, 12:23 PM

## 2017-05-17 NOTE — Patient Care Conference (Signed)
Inpatient RehabilitationTeam Conference and Plan of Care Update Date: 05/17/2017 Time: 2:20 PM    Patient Name: William [name]      Medical Record Number: [id number]  Date of Birth: [birth year] Sex: Male         Room/Bed: 4M11C/4M11C-01 Payor Info: Payor: MEDICARE / Plan: MEDICARE PART A AND B / Product Type: *No Product type* /    Admitting Diagnosis: Uretercal Structures  Admit Date/Time:  05/13/2017  2:01 PM Admission Comments: No comment available   Primary Diagnosis:  <principal problem not specified> Principal Problem: <principal problem not specified>  Patient Active Problem List   Diagnosis Date Noted  . Labile blood glucose   . Debilitated 05/13/2017  . Protein-calorie malnutrition, severe 05/10/2017  . History of prostate cancer   . Hydronephrosis, bilateral   . AKI (acute kidney injury) ([place])   . Diabetes mellitus type 2 in nonobese ([facility])   . Atrial fibrillation ([place])   . Benign essential HTN   . Leukocytosis   . Acute blood loss anemia   . Hydronephrosis   . Acute renal failure (ARF) ([place]) 05/06/2017  . Acute lower UTI 05/06/2017  . Occult GI bleeding 05/06/2017  . Stroke ([place]) 10/26/2016  . Obese 05/07/2014  . Expected blood loss anemia 05/07/2014  . S/P right TKA 05/06/2014  . DJD (degenerative joint disease) of knee 03/25/2014  . Preop cardiovascular exam 03/19/2014  . Abnormal ECG 03/19/2014  . Hypertension   . IDDM (insulin dependent diabetes mellitus) ([place])   . Hypercholesteremia   . Arthritis   . Prostate cancer ([place])   . Sleep apnea     Expected Discharge Date: Expected Discharge Date: 05/27/17  Team Members Present: Physician leading conference: Dr. Delice Lesch Social Worker Present: Ovidio Kin, LCSW Nurse Present: Rayetta Humphrey, RN PT Present: Other (comment) Althea Grimmer) OT Present: Clyda Greener, OT SLP Present: Weston Anna, SLP PPS Coordinator present : Daiva Nakayama, RN, CRRN     Current Status/Progress Goal Weekly Team Focus   Medical   Debilitation secondary to acute renal failure with bilateral hydronephrosis status post stenting 05/09/2017 as well as bilateral nephrostomy tubes 05/10/2017  Improve mobility, endurance, AKI, leukocytosis  See above   Bowel/Bladder   Continent to bowel with occasional accidents.LBM 6/20.Nephrostomy tubes in place,out put aprox. 500 cc on each side Q 12 hrs.  To continue continent to bowel with min. assisst.To monitor nephrostomy tubes out put closely.  To monitor out put Q shift.   Swallow/Nutrition/ Hydration             ADL's   supervision for UB selfcare, min to mod assist for LB selfcare sit to stand and for functional transfers  supervision overall  selfcare retraining, balance retraining, transfer training, neuromuscular re-education, pt/family education, therapeutic exercise   Mobility   mod/min assist with sit<>stand transfers, needing cues for sequence. Ambulating with min assist X90 ft with rw.   supervision  gait, balance, transfer training, pt/family education, bed mobility   Communication             Safety/Cognition/ Behavioral Observations            Pain   No c/o of pain  To keep pain levels less than 3.  To monitor for pain Q 2-3 hrs.   Skin   MASD on bottom.Gehard cream schedule.  To keep skin free of pressure sores.  To assess skin Q shift.      *See Care Plan and progress notes for long and  short-term goals.  Barriers to Discharge: Safety, mobility, AKI, HTN, DM, Leukocytosis    Possible Resolutions to Barriers:  Therapies, follow labs, optimize DM/HTN meds, abx    Discharge Planning/Teaching Needs:  HOme with wife who can provide supervision level only due to her own health issues. She is here daily and participates in therapies with him.      Team Discussion:    Revisions to Treatment Plan:  DC 6/30   Continued Need for Acute Rehabilitation Level of Care: The patient requires daily medical management by a physician with specialized  training in physical medicine and rehabilitation for the following conditions: Daily direction of a multidisciplinary physical rehabilitation program to ensure safe treatment while eliciting the highest outcome that is of practical value to the patient.: Yes Daily medical management of patient stability for increased activity during participation in an intensive rehabilitation regime.: Yes Daily analysis of laboratory values and/or radiology reports with any subsequent need for medication adjustment of medical intervention for : Post surgical problems;Diabetes problems;Blood pressure problems;Renal problems;Other  Elease Hashimoto 05/18/2017, 8:34 AM

## 2017-05-17 NOTE — Progress Notes (Signed)
Social Work Patient ID: William [name], male   DOB: Sep 04, 1934, 83 y.o.   MRN: [id number]  Met with pt and wife to discuss team conference goals supervision level and target discharge date 6/30. Both are pleased with how well he is doing but still have medical concerns and question where the infection occurred. Wife is a Therapist, sports and feels she can take care of the drains and dressing changes. She is agreeable to having Home Health follow for therapies. Prefers [facility] has had before. Will await equipment needs.

## 2017-05-17 NOTE — Progress Notes (Signed)
Physical Therapy Session Note  Patient Details  Name: William [name] MRN: [id number] Date of Birth: 08-02-1934  Today's Date: 05/17/2017 PT Individual Time: 4098-1191, 1430-1530 PT Individual Time Calculation (min): 77 min, 60 min  Short Term Goals: Week 1:  PT Short Term Goal 1 (Week 1): Pt will increasebed mobility to S.  PT Short Term Goal 2 (Week 1): Pt will increase transfers bed to chair to S.  PT Short Term Goal 3 (Week 1): Pt will ambulate with rolling walker and S about 150 feet. PT Short Term Goal 4 (Week 1): Pt will ascend/descend 2 step x 2 with rolling walker.   Skilled Therapeutic Interventions/Progress Updates:    Session 1: Pt in w/c upon arrival, reports not feeling very good at the beginning of session. Unable to express any specific cause. Vital signs taken and noted below. By end of session, pt reports feeling much better. Gait: pt ambulating 70 ft X1, 40 ft X2, 50 ft X1 - using rw and min assist. Cues for posture and stride length as needed. Transfers: sit<>stand performed throughout session with emphasis on sequence including scooting forward, hand placement, and forward lean. Pt unable to recall sequence throughout session. Exercise: seated; LAQ, marching, hip abd with resistance, ball squeeze, standing marching. Following session pt up in w/c with all needs in reach. Daughter and spouse observing throughout session.  Session 2: Pt in bed upon arrival, just finishing a nap. Assist provided with donning briefs and shorts while sitting EOB. Pt able to don socks and shoes with supervision. Transfers: sit<>stand performed with multiple repetitions throughout session. Working on weight shifting for scooting prior to standing. Gait: 125 ft X1, 100 ft X1, multiple shorter distances during session between surfaces working on ambulation and sit/stand transfers. Exercise: bilateral seated hip flexion 1X10 with assist provided with Rt LE. W/C propulsion - supervision level in halls. After  session, pt sitting up in w/c with all needs in reach and spouse present.   Therapy Documentation Precautions:  Precautions Precautions: Fall Precaution Comments: B Nephrostomy tubes Restrictions Weight Bearing Restrictions: No  Vital Signs: SpO2 100% on RA, Pulse 74, BP 119/62 Pain:  denies pain  See Function Navigator for Current Functional Status.   Therapy/Group: Individual Therapy  Linard Millers, PT 05/17/2017, 12:20 PM

## 2017-05-17 NOTE — Progress Notes (Signed)
[facility] PHYSICAL MEDICINE & REHABILITATION     PROGRESS NOTE  Subjective/Complaints:  Pt seen sitting up in bed this AM.  He slept fairly overnight due to adjustment with his CPAP.  Daughter with several questions stating that she believes there is poor communication with the medical team despite significant time spent every morning.  Some inconsistent information regarding pt's baseline and current level between daughter and patient. Pt's daughter would like Nephro involved.   ROS: Denies CP, SOB, N/V/D.  Objective: Vital Signs: Blood pressure (!) 118/45, pulse 68, temperature 98.1 F (36.7 C), temperature source Oral, resp. rate 18, height 6' 1.5" (1.867 m), weight 90.6 kg (199 lb 11.2 oz), SpO2 96 %. Dg Chest 2 View  Result Date: 05/15/2017 CLINICAL DATA:  Chest pain and weakness. EXAM: CHEST  2 VIEW COMPARISON:  CT chest 04/14/2017 and chest radiograph 10/27/2014. FINDINGS: Trachea is midline. Heart size stable. Lungs are clear. Possible tiny bilateral pleural effusions. Flowing anterior osteophytosis in the thoracic spine. IMPRESSION: Possible tiny bilateral pleural effusions. Electronically Signed   By: Lorin Picket M.D.   On: 05/15/2017 12:22    Recent Labs  05/15/17 0715 05/17/17 0911  WBC 19.7* 15.3*  HGB 13.3 12.0*  HCT 41.2 37.6*  PLT 291 366    Recent Labs  05/15/17 0715 05/17/17 0911  NA 139 136  K 4.3 3.9  CL 103 99*  GLUCOSE 170* 143*  BUN 27* 25*  CREATININE 1.39* 1.32*  CALCIUM 8.9 8.8*   CBG (last 3)   Recent Labs  05/16/17 1633 05/16/17 2157 05/17/17 0714  GLUCAP 104* 193* 117*    Wt Readings from Last 3 Encounters:  05/16/17 90.6 kg (199 lb 11.2 oz)  05/12/17 92.5 kg (204 lb)  01/05/17 104.3 kg (230 lb)    Physical Exam:  BP (!) 118/45 (BP Location: Left Arm)   Pulse 68   Temp 98.1 F (36.7 C) (Oral)   Resp 18   Ht 6' 1.5" (1.867 m)   Wt 90.6 kg (199 lb 11.2 oz)   SpO2 96%   BMI 25.99 kg/m  Constitutional: He appears  well-developed. Vitalsreviewed. NAD. HENT: Normocephalic. Atraumatic Eyes: EOMare normal. No discharge.  Cardiovascular: IRIR Respiratory: Effort normal and breath sounds normal.  GI: Soft. Bowel sounds are normal.  Neurological: He is alert.  He does show some delay in processing.  HOH Motor: Grossly 5/5 b/l UE B/l LE 4+/5 RLE, 5/5 LLE (unchanged) Skin. Warm and dry. Intact.  Assessment/Plan: 1. Functional deficits secondary to bilateral hydronephrosis status post stenting 05/09/2017 as well as bilateral nephrostomy tubes which require 3+ hours per day of interdisciplinary therapy in a comprehensive inpatient rehab setting. Physiatrist is providing close team supervision and 24 hour management of active medical problems listed below. Physiatrist and rehab team continue to assess barriers to discharge/monitor patient progress toward functional and medical goals.  Function:  Bathing Bathing position   Position: Shower  Bathing parts Body parts bathed by patient: Right arm, Left arm, Chest, Abdomen, Right upper leg, Left upper leg Body parts bathed by helper: Back  Bathing assist Assist Level: Touching or steadying assistance(Pt > 75%)      Upper Body Dressing/Undressing Upper body dressing   What is the patient wearing?: Pull over shirt/dress     Pull over shirt/dress - Perfomed by patient: Thread/unthread left sleeve, Put head through opening, Pull shirt over trunk, Thread/unthread right sleeve          Upper body assist Assist Level: Supervision or  verbal cues      Lower Body Dressing/Undressing Lower body dressing   What is the patient wearing?:  (Pt did not donn Lb clothing after shower secondary to getting back in bed and laying on his side to air out his buttocks rash/wound) Underwear - Performed by patient: Thread/unthread right underwear leg, Thread/unthread left underwear leg, Pull underwear up/down   Pants- Performed by patient: Thread/unthread right pants  leg, Thread/unthread left pants leg, Pull pants up/down     Non-skid slipper socks- Performed by helper: Don/doff right sock, Don/doff left sock                  Lower body assist Assist for lower body dressing: Touching or steadying assistance (Pt > 75%)      Toileting Toileting Toileting activity did not occur: No continent bowel/bladder event Toileting steps completed by patient: Performs perineal hygiene Toileting steps completed by helper: Adjust clothing prior to toileting, Adjust clothing after toileting, Performs perineal hygiene Toileting Assistive Devices: Grab bar or rail  Toileting assist     Transfers Chair/bed transfer   Chair/bed transfer method: Ambulatory Chair/bed transfer assist level: Touching or steadying assistance (Pt > 75%) Chair/bed transfer assistive device: Armrests, Medical sales representative     Max distance: 90 ft Assist level: Touching or steadying assistance (Pt > 75%)   Wheelchair   Type: Manual Max wheelchair distance: 120 ft Assist Level: Supervision or verbal cues  Cognition Comprehension Comprehension assist level: Understands basic 90% of the time/cues < 10% of the time  Expression Expression assist level: Expresses basic needs/ideas: With no assist  Social Interaction Social Interaction assist level: Interacts appropriately 75 - 89% of the time - Needs redirection for appropriate language or to initiate interaction.  Problem Solving Problem solving assist level: Solves complex 90% of the time/cues < 10% of the time  Memory Memory assist level: Recognizes or recalls 25 - 49% of the time/requires cueing 50 - 75% of the time    Medical Problem List and Plan: 1. Debilitation secondary to acute renal failure with bilateral hydronephrosis status post stenting 05/09/2017 as well as bilateral nephrostomy tubes 05/10/2017  Cont CIR 2. DVT Prophylaxis/Anticoagulation:Chronic Eliquis 3. Pain Management: Tylenol 4. Mood/delirium:  Trazodone 50 mg daily at bedtime, Ativan 1 mg as needed anxiety/sleep 5. Neuropsych: This patient iscapable of making decisions on hisown behalf. 6. Skin/Wound Care: Routine skin checks 7. Fluids/Electrolytes/Nutrition: Routine I&Os  Diet liberalized per daughter request 8.AKI/hypernatremiafelt to be secondary to obstruction.Follow-up renal services.  Cr 1.32 on 6/20  Cont to monitor 9.Hypertension. Norvasc 5 mg daily at bedtime  Controlled 6/20 10.Atrial fibrillation.Eliquisresumed. Cardiac rate control 11.Coag-negative staph UTI.   Blood cultures NG.   Repeat urine culture NG.   IV rocephin- will continue in hospital, will inquire about length of tx 12.Type 2 diabetes mellitus. Hemoglobin A1c 6.8. Sliding scale insulin.   Levemir 6 units started on 6/19 (home 16 units)  Labile, will cont to monitor 13.History of prostate cancer and radiation therapy 14.Hyperlipidemia. Pravachol 15. Hypoalbuminemia  Supplement initiated  16. Leukocytosis  WBCs 15.3 on 6/20  Afrebrile  Cont to monitor  CXR reviewed 6/18, relatively unremarkable 17. ABLA  Hb 12.0 on 6/20  Cont to monitor   >35 minutes spent with >25 minutes with patient and other daughter in counseling and coordination of care regarding abx, tubes, O2 sats, WBCs  LOS (Days) 4 A FACE TO FACE EVALUATION WAS PERFORMED  Real Cona Lorie Phenix 05/17/2017 11:39 AM

## 2017-05-18 ENCOUNTER — Inpatient Hospital Stay (HOSPITAL_COMMUNITY): Payer: Medicare Other | Admitting: Physical Therapy

## 2017-05-18 ENCOUNTER — Inpatient Hospital Stay (HOSPITAL_COMMUNITY): Payer: Medicare Other | Admitting: Occupational Therapy

## 2017-05-18 ENCOUNTER — Inpatient Hospital Stay (HOSPITAL_COMMUNITY): Payer: Medicare Other | Admitting: Speech Pathology

## 2017-05-18 ENCOUNTER — Inpatient Hospital Stay (HOSPITAL_COMMUNITY): Payer: Medicare Other

## 2017-05-18 LAB — GLUCOSE, CAPILLARY
GLUCOSE-CAPILLARY: 115 mg/dL — AB (ref 65–99)
GLUCOSE-CAPILLARY: 189 mg/dL — AB (ref 65–99)
GLUCOSE-CAPILLARY: 136 mg/dL — AB (ref 65–99)
Glucose-Capillary: 128 mg/dL — ABNORMAL HIGH (ref 65–99)

## 2017-05-18 NOTE — Progress Notes (Signed)
Physical Therapy Session Note  Patient Details  Name: William [name] MRN: [id number] Date of Birth: July 31, 1934  Today's Date: 05/18/2017 PT Individual Time: 0900-1000 PT Individual Time Calculation (min): 60 min   Short Term Goals: Week 1:  PT Short Term Goal 1 (Week 1): Pt will increasebed mobility to S.  PT Short Term Goal 2 (Week 1): Pt will increase transfers bed to chair to S.  PT Short Term Goal 3 (Week 1): Pt will ambulate with rolling walker and S about 150 feet. PT Short Term Goal 4 (Week 1): Pt will ascend/descend 2 step x 2 with rolling walker.   Skilled Therapeutic Interventions/Progress Updates:   Pt received supine in bed and agreeable to PT. Supine>sit transfer with supervision assist and moderate cues for sequencing and technique.   Lower body dressing EOB with min assist to come to standing EOB and PT required to pull pants and underpants to waist while pt stood with BUE support. Sit<>stand transfers from [facility] x 6 throughout treatment with min-mod assist and moderate cues for UE placement and improved anterior weight shift.   WC mobility in rehab hall x 162f with supervision assist from PT and BLE and BUE propulsion.   PT instructed in gait training with RW [id number] PT provided min assist for safety with min cues and improved posture and breathing.  PT applied ted hose following gait training due to noted increase in ankle swelling.  Standing balance on R wedge to improve anterior weight shift and heel cord stretching. BUE support on RW. 3x 1 minute. Moderate cues for improved posture and increased knee extension on the RLE.   [facility] in reach and all needs met.        Therapy Documentation Precautions:  Precautions Precautions: Fall Precaution Comments: B Nephrostomy tubes Restrictions Weight Bearing Restrictions: No Vital Signs: Therapy Vitals Pulse Rate: 69 Resp: (!) 29 BP: (!) 127/54 Patient Position  (if appropriate): Lying Oxygen Therapy SpO2: 97 % O2 Device: Not Delivered   See Function Navigator for Current Functional Status.   Therapy/Group: Individual Therapy  AuLorie Phenix/21/2018, 9:26 AM

## 2017-05-18 NOTE — Progress Notes (Signed)
[place] PHYSICAL MEDICINE & REHABILITATION     PROGRESS NOTE  Subjective/Complaints:  Pt seen sitting up in bed this AM eating breakfast.  He slept well overnight.  Son at bedside states he tolerated CPAP better.  ROS: Denies CP, SOB, N/V/D.  Objective: Vital Signs: Blood pressure (!) 127/54, pulse 69, temperature 98.1 F (36.7 C), temperature source Oral, resp. rate (!) 29, height 6' 1.5" (1.867 m), weight 92 kg (202 lb 14.4 oz), SpO2 97 %. No results found.  Recent Labs  05/17/17 0911  WBC 15.3*  HGB 12.0*  HCT 37.6*  PLT 366    Recent Labs  05/17/17 0911  NA 136  K 3.9  CL 99*  GLUCOSE 143*  BUN 25*  CREATININE 1.32*  CALCIUM 8.8*   CBG (last 3)   Recent Labs  05/17/17 1637 05/17/17 2240 05/18/17 0617  GLUCAP 141* 147* 115*    Wt Readings from Last 3 Encounters:  05/17/17 92 kg (202 lb 14.4 oz)  05/12/17 92.5 kg (204 lb)  01/05/17 104.3 kg (230 lb)    Physical Exam:  BP (!) 127/54 (BP Location: Left Arm)   Pulse 69   Temp 98.1 F (36.7 C) (Oral)   Resp (!) 29   Ht 6' 1.5" (1.867 m)   Wt 92 kg (202 lb 14.4 oz)   SpO2 97%   BMI 26.41 kg/m  Constitutional: He appears well-developed. Vitalsreviewed. NAD. HENT: Normocephalic. Atraumatic Eyes: EOMare normal. No discharge.  Cardiovascular: IRIR Respiratory: Effort normal and breath sounds normal.  GI: Soft. Bowel sounds are normal.  Neurological: He is alert.  Delay in processing.  HOH Motor: Grossly 5/5 b/l UE B/l LE 4+/5 RLE, 4+/5 LLE  Skin. Warm and dry. Intact.  Assessment/Plan: 1. Functional deficits secondary to bilateral hydronephrosis status post stenting 05/09/2017 as well as bilateral nephrostomy tubes which require 3+ hours per day of interdisciplinary therapy in a comprehensive inpatient rehab setting. Physiatrist is providing close team supervision and 24 hour management of active medical problems listed below. Physiatrist and rehab team continue to assess barriers to  discharge/monitor patient progress toward functional and medical goals.  Function:  Bathing Bathing position   Position: Shower  Bathing parts Body parts bathed by patient: Right arm, Left arm, Chest, Abdomen, Right upper leg, Left upper leg, Right lower leg, Left lower leg Body parts bathed by helper: Buttocks, Back  Bathing assist Assist Level: Touching or steadying assistance(Pt > 75%)      Upper Body Dressing/Undressing Upper body dressing   What is the patient wearing?: Pull over shirt/dress     Pull over shirt/dress - Perfomed by patient: Thread/unthread left sleeve, Put head through opening, Pull shirt over trunk, Thread/unthread right sleeve          Upper body assist Assist Level: Supervision or verbal cues      Lower Body Dressing/Undressing Lower body dressing   What is the patient wearing?: Socks, Shoes, Pants Underwear - Performed by patient: Thread/unthread right underwear leg, Thread/unthread left underwear leg, Pull underwear up/down   Pants- Performed by patient: Thread/unthread right pants leg, Thread/unthread left pants leg, Pull pants up/down     Non-skid slipper socks- Performed by helper: Don/doff right sock, Don/doff left sock Socks - Performed by patient: Don/doff right sock, Don/doff left sock   Shoes - Performed by patient: Don/doff right shoe, Don/doff left shoe, Fasten right, Fasten left            Lower body assist Assist for lower body dressing:  Touching or steadying assistance (Pt > 75%)      Toileting Toileting Toileting activity did not occur: No continent bowel/bladder event Toileting steps completed by patient: Performs perineal hygiene Toileting steps completed by helper: Adjust clothing prior to toileting, Performs perineal hygiene, Adjust clothing after toileting Toileting Assistive Devices: Grab bar or rail  Toileting assist Assist level: Touching or steadying assistance (Pt.75%) (per Elby Beck, NT)   Transfers Chair/bed  transfer   Chair/bed transfer method: Ambulatory Chair/bed transfer assist level: Touching or steadying assistance (Pt > 75%) Chair/bed transfer assistive device: Armrests, Medical sales representative     Max distance: 175ft Assist level: Touching or steadying assistance (Pt > 75%)   Wheelchair   Type: Manual Max wheelchair distance: 120 ft Assist Level: Supervision or verbal cues  Cognition Comprehension Comprehension assist level: Understands basic 90% of the time/cues < 10% of the time  Expression Expression assist level: Expresses basic needs/ideas: With no assist  Social Interaction Social Interaction assist level: Interacts appropriately 90% of the time - Needs monitoring or encouragement for participation or interaction.  Problem Solving Problem solving assist level: Solves complex 90% of the time/cues < 10% of the time  Memory Memory assist level: Recognizes or recalls 25 - 49% of the time/requires cueing 50 - 75% of the time    Medical Problem List and Plan: 1. Debilitation secondary to acute renal failure with bilateral hydronephrosis status post stenting 05/09/2017 as well as bilateral nephrostomy tubes 05/10/2017  Cont CIR 2. DVT Prophylaxis/Anticoagulation:Chronic Eliquis 3. Pain Management: Tylenol 4. Mood/delirium: Trazodone 50 mg daily at bedtime, Ativan 1 mg as needed anxiety/sleep 5. Neuropsych: This patient iscapable of making decisions on hisown behalf. 6. Skin/Wound Care: Routine skin checks 7. Fluids/Electrolytes/Nutrition: Routine I&Os  Diet liberalized per daughter request 8.AKI/hypernatremiafelt to be secondary to obstruction.Follow-up renal services.  Cr 1.32 on 6/20  Cont to monitor 9.Hypertension. Norvasc 5 mg daily at bedtime  Controlled 6/21 10.Atrial fibrillation.Eliquisresumed. Cardiac rate control 11.Coag-negative staph UTI.   Blood cultures NG.   Repeat urine culture NG.   IV rocephin- will continue to follow  12.Type 2  diabetes mellitus. Hemoglobin A1c 6.8. Sliding scale insulin.   Levemir 6 units started on 6/19 (home 16 units)  Labile, will cont to monitor 13.History of prostate cancer and radiation therapy 14.Hyperlipidemia. Pravachol 15. Hypoalbuminemia  Supplement initiated  16. Leukocytosis  WBCs 15.3 on 6/20  Labs ordered for tomorrow  Afrebrile  Cont to monitor  CXR reviewed 6/18, relatively unremarkable 17. ABLA  Hb 12.0 on 6/20  Labs ordered for tomorrow  Cont to monitor  LOS (Days) 5 A FACE TO FACE EVALUATION WAS PERFORMED  Ankit Lorie Phenix 05/18/2017 8:17 AM

## 2017-05-18 NOTE — Evaluation (Signed)
Speech Language Pathology Assessment and Plan  Patient Details  Name: William [name] MRN: [id number] Date of Birth: 02/28/1934  SLP Diagnosis: Cognitive Impairments;Dysphagia  Rehab Potential: Good ELOS: 7 days    Today's Date: 05/18/2017 SLP Individual Time: 1000-1100 SLP Individual Time Calculation (min): 60 min   Problem List:  Patient Active Problem List   Diagnosis Date Noted  . Labile blood glucose   . Debilitated 05/13/2017  . Protein-calorie malnutrition, severe 05/10/2017  . History of prostate cancer   . Hydronephrosis, bilateral   . AKI (acute kidney injury) ([place])   . Diabetes mellitus type 2 in nonobese (HCC)   . Atrial fibrillation ([place])   . Benign essential HTN   . Leukocytosis   . Acute blood loss anemia   . Hydronephrosis   . Acute renal failure (ARF) ([place]) 05/06/2017  . Acute lower UTI 05/06/2017  . Occult GI bleeding 05/06/2017  . Stroke ([place]) 10/26/2016  . Obese 05/07/2014  . Expected blood loss anemia 05/07/2014  . S/P right TKA 05/06/2014  . DJD (degenerative joint disease) of knee 03/25/2014  . Preop cardiovascular exam 03/19/2014  . Abnormal ECG 03/19/2014  . Hypertension   . IDDM (insulin dependent diabetes mellitus) ([place])   . Hypercholesteremia   . Arthritis   . Prostate cancer ([place])   . Sleep apnea    Past Medical History:  Past Medical History:  Diagnosis Date  . Arthritis   . Diabetes mellitus without complication ([place])   . Hypercholesteremia   . Hypertension   . Kidney stones   . Prostate cancer [facility])    with radiation treatment  . Sleep apnea 2/14   "mild per patient" hasnt gotten CPAP machine  . Stroke [facility])    Past Surgical History:  Past Surgical History:  Procedure Laterality Date  . ADENOIDECTOMY    . CYSTOSCOPY W/ URETERAL STENT PLACEMENT Bilateral 05/07/2017   Procedure: CYSTOSCOPY WITH BILATERAL RETROGRADE PYELOGRAM CYSTOGRAM BILATERAL URETERAL STENT PLACEMENT;  Surgeon: Festus Aloe, MD;  Location: [place];   Service: Urology;  Laterality: Bilateral;  . FLEXIBLE SIGMOIDOSCOPY N/A 09/12/2013   Procedure: FLEXIBLE SIGMOIDOSCOPY;  Surgeon: Milus Banister, MD;  Location: [facility];  Service: Endoscopy;  Laterality: N/A;  . HERNIA REPAIR    . HOT HEMOSTASIS N/A 09/12/2013   Procedure: HOT HEMOSTASIS (ARGON PLASMA COAGULATION/BICAP);  Surgeon: Milus Banister, MD;  Location: [facility];  Service: Endoscopy;  Laterality: N/A;  . IR NEPHROSTOMY PLACEMENT LEFT  05/09/2017  . IR NEPHROSTOMY PLACEMENT RIGHT  05/09/2017  . LIPOMA EXCISION    . OTHER SURGICAL HISTORY     s/p prostate radiation  . TONSILLECTOMY    . TOTAL KNEE ARTHROPLASTY Left 03/25/2014   Procedure: LEFT TOTAL KNEE ARTHROPLASTY;  Surgeon: [name], MD;  Location: [facility];  Service: Orthopedics;  Laterality: Left;  . TOTAL KNEE ARTHROPLASTY Right 05/06/2014   Procedure: RIGHT TOTAL KNEE ARTHROPLASTY;  Surgeon: [name], MD;  Location: [facility];  Service: Orthopedics;  Laterality: Right;    Assessment / Plan / Recommendation Clinical Impression William Palmeri [name] a 83 y.o.right handed malewith history of Diabetes mellitus, hypertension, prostate cancer with radiation therapy, CVA November 2017and did not receive inpatient rehabilitation services discharged to home at supervision level, A. fib maintained on Eliquis, right TKA 2015. Per chart review and familypatient lives with spouse, who can only provide supervision at discharge. Independent driving prior to admission. One level home.Wife limited physical assistance.Presented 05/06/2017 with generalized weakness/lethargy, weight loss and recurrent  falls over the past 2 weeks as well as reported urinary frequency and nocturia. Creatinine elevated 4.54fom baseline 1.01 BUN 43. Recent CT revealed left hydronephrosis with dilation of the ureter down to the pelvic brim ofthe iliac and then the ureter appeared to taper to a normal caliber. Ultrasound follow-up revealed moderate left  hydronephrosis. Urology consulted and underwent cystoscopy with bilateral retrograde myelogram with findings of ureteral strictures and underwent stent placement 05/09/2017 per Dr EJunious Silkas well as bilateral nephrostomy tubes per interventional radiology 05/10/2017. Nephrology consulted for AKIfelt to be mostly obstructive and continue to monitorhis creatinine 1.57. Chronic Eliquisresumed 05/11/2017.Coag negative staph UTI and completing course of doxycycline and monitoring white blood cell count 16,000 as patient remains afebrile.Patient with intermittent bouts of restlessness agitation requiring Ativan.Therapy evaluations ongoing. M.D. has requested physical medicine rehabilitation consult.Patient was admitted for a comprehensive rehabilitation program on 05/13/17.   BSE and cognitive linguistic evaluations requested and completed on 05/18/17. Pt presents with increased risk of aspiraiton d/t delayed cough and throat clear after consuming any texture and liquid consistency. Trials of regular, puree, thin liquids, mixed textures, cu, straw, spoon and necatr thick liquids administered with use of compensatory strategies. Delayed throat clear and cough continued. Recommend further instrumental study. Daughter provides that pt's condition began with CVA in 09/2016. No BSE performed at that time. Pt also presents with moderate cognitive impairment c/b score of 14 out of 30 on [place] version 8.1 (n=>26) with moderate deficits in orientation, recall of new information, basic problem solving, attention and intellectual awareness. Skilled ST is required to address the above mentioned deficits, increase functional independence and reduce caregiver burden.  Anticipate that pt will require 24 hour supervision and follow up ST at discharge. Education provied to pt's wife and daughter on 24 hour supervision, all questions answered to their satisfaction.     Skilled Therapeutic Interventions          Skilled treatment  session focused on completion of BSE and cognitive linguistic evaluations, see above. Recommend instrumental testing to further evaluate swallow function and make appropriate diet recommendations.    SLP Assessment  Patient will need skilled Speech Lanaguage Pathology Services during CIR admission    Recommendations  SLP Diet Recommendations: Age appropriate regular solids;Thin (Pending MBS on 6/22) Liquid Administration via: Cup;Straw Medication Administration: Whole meds with puree Supervision: Patient able to self feed Compensations: Minimize environmental distractions;Slow rate;Small sips/bites Postural Changes and/or Swallow Maneuvers: Seated upright 90 degrees Oral Care Recommendations: Oral care BID Patient destination: Home Follow up Recommendations: Home Health SLP;Outpatient SLP Equipment Recommended: None recommended by SLP    SLP Frequency 3 to 5 out of 7 days   SLP Duration  SLP Intensity  SLP Treatment/Interventions 7 days  Minumum of 1-2 x/day, 30 to 90 minutes  Cognitive remediation/compensation;Cueing hierarchy;Dysphagia/aspiration precaution training;Functional tasks;Internal/external aids;Medication managment;Patient/family education;Speech/Language facilitation;Therapeutic Activities    Pain    Prior Functioning Cognitive/Linguistic Baseline: Baseline deficits Baseline deficit details: Wife reports decreased STM deficits Type of Home: House  Lives With: Spouse Available Help at Discharge: Family;Available 24 hours/day Vocation: Retired  Function:  Eating Eating   Modified Consistency Diet: No             Cognition Comprehension Comprehension assist level: Understands basic 90% of the time/cues < 10% of the time  Expression   Expression assist level: Expresses basic needs/ideas: With no assist  Social Interaction Social Interaction assist level: Interacts appropriately 90% of the time - Needs monitoring or encouragement for participation or  interaction.  Problem Solving Problem solving assist level: Solves basic 50 - 74% of the time/requires cueing 25 - 49% of the time  Memory Memory assist level: Recognizes or recalls less than 25% of the time/requires cueing greater than 75% of the time   Short Term Goals: Week 1: SLP Short Term Goal 1 (Week 1): Pt will consume current diet without overt s/s of aspiration and Mod I cues for use of compensatory swallowing strategies.  SLP Short Term Goal 2 (Week 1): Pt will utilize external memory aids to recall new, daily information with Mod A cues.  SLP Short Term Goal 3 (Week 1): Pt will consistently demosntrate O x 4 with Min A cues for use of external aids. SLP Short Term Goal 4 (Week 1): Pt will complete basic familiar tasks iwth Mod A verbal cues for funcitonal problem solving.  SLP Short Term Goal 5 (Week 1): Pt will identify 1 phsyical and 1 cognitive deficit that is related to acute illness with Mod A cues.   Refer to Care Plan for Long Term Goals  Recommendations for other services: None   Discharge Criteria: Patient will be discharged from SLP if patient refuses treatment 3 consecutive times without medical reason, if treatment goals not met, if there is a change in medical status, if patient makes no progress towards goals or if patient is discharged from hospital.  The above assessment, treatment plan, treatment alternatives and goals were discussed and mutually agreed upon: by patient and by family   Sherry Rogus B. Rutherford Nail, M.S., CCC-SLP Speech-Language Pathologist   Deone Omahoney 05/18/2017, 2:30 PM

## 2017-05-18 NOTE — Procedures (Signed)
Patient's home CPAP is beside and ready for use.  Patient's daughter said she will place on him when he is ready for bed.

## 2017-05-18 NOTE — Progress Notes (Signed)
Social Work Patient ID: William [name], male   DOB: 1934-04-28, 83 y.o.   MRN: [id number]   Pt's dtr came to [place] office to ask Ovidio Kin, CSW some additional questions.  This CSW listened to questions, one of which was about pt's sleep medication.  CSW passed their concern on to [facility], that maybe pt's sleep medication needed to be adjusted as pt awakes at 1:30 AM nightly.  Dan to address this further with pt's bedside nurse.  She also asked if it was acceptable for pt to have a private, paid caregiver in the room at night to give pt's wife a break and because dtr from [facility] is going home for a few days.  CSW told her this was fine and to just alert the nursing staff so that they would know what to expect.  Thirdly, dtr wanted to clarify what pt's goals are for CIR at d/c.  CSW explained that all of pt's goals are supervision to modified independent and explained that is no hands on care expected, if pt progresses as we expect and reaches our targeted goals.  CSW gave her a copy of PT/OT goals and she expressed understanding.  CSW remains available as needed.

## 2017-05-18 NOTE — Progress Notes (Signed)
Occupational Therapy Note  Patient Details  Name: William [name] MRN: [id number] Date of Birth: 12/14/1933  Today's Date: 05/18/2017 OT Individual Time: 1400-1430 OT Individual Time Calculation (min): 30 min   Pt denies pain Individual Therapy  Pt resting in recliner upon arrival with family present. Pt initially engaged in sit<>stand and functional amb with RW.  Pt's B indwelling catheter bags strapped to BLE during ambulation.  Pt amb approx 100' with steady A and returned to room.  Pt engaged in sit<>stand X 5 and 3 sets of 8 squats with sitting rest breaks.  Pt requires mod A for sit<>stand from recliner.  Pt remained in recliner with family present.   Leotis Shames [name] 05/18/2017, 2:48 PM

## 2017-05-18 NOTE — Progress Notes (Addendum)
Social Work Patient ID: William [name], male   DOB: 1934/01/13, 82 y.o.   MRN: [id number]  Met with wife and daughter from [facility] to answer questions. Daughter had many and wanted Mom to consider hiring assist at home So wife will have a break. Wife wants to take each day at a time and see how pt is next week and then go from there. She does plan to take him home and does not want to pursue a NH. Feels will do better at home. And is aware of her options. Daughter is planning to come back once pt goes home to assist with the transition. Will work with wife next week and see how she is feeling regarding pt discharge date and being able to provide the Care he requires. Gave wife the private duty list she will follow up with.

## 2017-05-18 NOTE — Progress Notes (Signed)
Occupational Therapy Session Note  Patient Details  Name: William [name] MRN: [id number] Date of Birth: 03-Nov-1934  Today's Date: 05/18/2017 OT Individual Time: 1105-1200 OT Individual Time Calculation (min): 55 min    Short Term Goals: Week 1:  OT Short Term Goal 1 (Week 1): STGs=LTGs 2/2 ELOS  Skilled Therapeutic Interventions/Progress Updates:    Pt received in day room with family present. Pt requesting shower this session. OT assisted pt back to room via wheelchair for time management. OT covered IV and B indwelling dressings. Pt ambulated 10' with RW and steady assistance for transfer onto shower seat. Pt bathing while seated with steady assist for balance and assistance to wash buttocks. Pt returned to sit in wheelchair for dressing tasks. Pt needing min cues for safety and sequencing of tasks. OT applied barrier cream to buttocks and pt standing with min A balance while pulling pants over B hips. Pt remained in wheelchair with lunch tray place in front of him and call bell within reach upon exiting the room.   Therapy Documentation Precautions:  Precautions Precautions: Fall Precaution Comments: B Nephrostomy tubes Restrictions Weight Bearing Restrictions: No  See Function Navigator for Current Functional Status.   Therapy/Group: Individual Therapy  Gypsy Decant 05/18/2017, 12:06 PM

## 2017-05-18 NOTE — Progress Notes (Signed)
Social Work Jermie Hippe, Eliezer Champagne Social Worker Signed Physical Medicine and Rehabilitation  Patient Care Conference Date of Service: 05/17/2017  3:16 PM      Hide copied text Hover for attribution information Inpatient RehabilitationTeam Conference and Plan of Care Update Date: 05/17/2017 Time: 2:20 PM      Patient Name: William [name]      Medical Record Number: [id number]  Date of Birth: 10-20-1934 Sex: Male         Room/Bed: 4M11C/4M11C-01 Payor Info: Payor: MEDICARE / Plan: MEDICARE PART A AND B / Product Type: *No Product type* /     Admitting Diagnosis: Uretercal Structures  Admit Date/Time:  05/13/2017  2:01 PM Admission Comments: No comment available    Primary Diagnosis:  <principal problem not specified> Principal Problem: <principal problem not specified>       Patient Active Problem List    Diagnosis Date Noted  . Labile blood glucose    . Debilitated 05/13/2017  . Protein-calorie malnutrition, severe 05/10/2017  . History of prostate cancer    . Hydronephrosis, bilateral    . AKI (acute kidney injury) ([place])    . Diabetes mellitus type 2 in nonobese ([facility])    . Atrial fibrillation ([place])    . Benign essential HTN    . Leukocytosis    . Acute blood loss anemia    . Hydronephrosis    . Acute renal failure (ARF) ([place]) 05/06/2017  . Acute lower UTI 05/06/2017  . Occult GI bleeding 05/06/2017  . Stroke ([place]) 10/26/2016  . Obese 05/07/2014  . Expected blood loss anemia 05/07/2014  . S/P right TKA 05/06/2014  . DJD (degenerative joint disease) of knee 03/25/2014  . Preop cardiovascular exam 03/19/2014  . Abnormal ECG 03/19/2014  . Hypertension    . IDDM (insulin dependent diabetes mellitus) ([place])    . Hypercholesteremia    . Arthritis    . Prostate cancer ([place])    . Sleep apnea        Expected Discharge Date: Expected Discharge Date: 05/27/17   Team Members Present: Physician leading conference: Dr. Delice Lesch Social Worker Present: Ovidio Kin,  LCSW Nurse Present: Rayetta Humphrey, RN PT Present: Other (comment) Althea Grimmer) OT Present: Clyda Greener, OT SLP Present: Weston Anna, SLP PPS Coordinator present : Daiva Nakayama, RN, CRRN       Current Status/Progress Goal Weekly Team Focus  Medical     Debilitation secondary to acute renal failure with bilateral hydronephrosis status post stenting 05/09/2017 as well as bilateral nephrostomy tubes 05/10/2017  Improve mobility, endurance, AKI, leukocytosis  See above   Bowel/Bladder     Continent to bowel with occasional accidents.LBM 6/20.Nephrostomy tubes in place,out put aprox. 500 cc on each side Q 12 hrs.  To continue continent to bowel with min. assisst.To monitor nephrostomy tubes out put closely.  To monitor out put Q shift.   Swallow/Nutrition/ Hydration               ADL's     supervision for UB selfcare, min to mod assist for LB selfcare sit to stand and for functional transfers  supervision overall  selfcare retraining, balance retraining, transfer training, neuromuscular re-education, pt/family education, therapeutic exercise   Mobility     mod/min assist with sit<>stand transfers, needing cues for sequence. Ambulating with min assist X90 ft with rw.   supervision  gait, balance, transfer training, pt/family education, bed mobility   Communication  Safety/Cognition/ Behavioral Observations             Pain     No c/o of pain  To keep pain levels less than 3.  To monitor for pain Q 2-3 hrs.   Skin     MASD on bottom.Gehard cream schedule.  To keep skin free of pressure sores.  To assess skin Q shift.     *See Care Plan and progress notes for long and short-term goals.   Barriers to Discharge: Safety, mobility, AKI, HTN, DM, Leukocytosis     Possible Resolutions to Barriers:  Therapies, follow labs, optimize DM/HTN meds, abx     Discharge Planning/Teaching Needs:  HOme with wife who can provide supervision level only due to her own health issues.  She is here daily and participates in therapies with him.      Team Discussion:     Revisions to Treatment Plan:  DC 6/30    Continued Need for Acute Rehabilitation Level of Care: The patient requires daily medical management by a physician with specialized training in physical medicine and rehabilitation for the following conditions: Daily direction of a multidisciplinary physical rehabilitation program to ensure safe treatment while eliciting the highest outcome that is of practical value to the patient.: Yes Daily medical management of patient stability for increased activity during participation in an intensive rehabilitation regime.: Yes Daily analysis of laboratory values and/or radiology reports with any subsequent need for medication adjustment of medical intervention for : Post surgical problems;Diabetes problems;Blood pressure problems;Renal problems;Other   Elease Hashimoto 05/18/2017, 8:34 AM       Patient ID: William [name], male   DOB: January 07, 1934, 83 y.o.   MRN: [id number]

## 2017-05-19 ENCOUNTER — Inpatient Hospital Stay (HOSPITAL_COMMUNITY): Payer: Medicare Other | Admitting: Occupational Therapy

## 2017-05-19 ENCOUNTER — Inpatient Hospital Stay (HOSPITAL_COMMUNITY): Payer: Medicare Other | Admitting: Physical Therapy

## 2017-05-19 ENCOUNTER — Encounter (HOSPITAL_COMMUNITY): Payer: Self-pay | Admitting: *Deleted

## 2017-05-19 ENCOUNTER — Inpatient Hospital Stay (HOSPITAL_COMMUNITY): Payer: Medicare Other

## 2017-05-19 ENCOUNTER — Inpatient Hospital Stay (HOSPITAL_COMMUNITY): Payer: Medicare Other | Admitting: Speech Pathology

## 2017-05-19 LAB — BASIC METABOLIC PANEL
ANION GAP: 6 (ref 5–15)
BUN: 17 mg/dL (ref 6–20)
CALCIUM: 8.7 mg/dL — AB (ref 8.9–10.3)
CO2: 30 mmol/L (ref 22–32)
Chloride: 103 mmol/L (ref 101–111)
Creatinine, Ser: 1.23 mg/dL (ref 0.61–1.24)
GFR calc non Af Amer: 54 mL/min — ABNORMAL LOW (ref >60)
Glucose, Bld: 130 mg/dL — ABNORMAL HIGH (ref 65–99)
Potassium: 3.8 mmol/L (ref 3.5–5.1)
SODIUM: 139 mmol/L (ref 135–145)

## 2017-05-19 LAB — CBC WITH DIFFERENTIAL/PLATELET
BASOS ABS: 0 10*3/uL (ref 0.0–0.1)
Basophils Relative: 0 %
EOS ABS: 0.2 10*3/uL (ref 0.0–0.7)
Eosinophils Relative: 2 %
HEMATOCRIT: 34.6 % — AB (ref 39.0–52.0)
HEMOGLOBIN: 11.4 g/dL — AB (ref 13.0–17.0)
Lymphocytes Relative: 8 %
Lymphs Abs: 1.1 10*3/uL (ref 0.7–4.0)
MCH: 30.5 pg (ref 26.0–34.0)
MCHC: 32.9 g/dL (ref 30.0–36.0)
MCV: 92.5 fL (ref 78.0–100.0)
MONOS PCT: 11 %
Monocytes Absolute: 1.5 10*3/uL — ABNORMAL HIGH (ref 0.1–1.0)
NEUTROS ABS: 10.8 10*3/uL — AB (ref 1.7–7.7)
NEUTROS PCT: 79 %
Platelets: 361 10*3/uL (ref 150–400)
RBC: 3.74 MIL/uL — AB (ref 4.22–5.81)
RDW: 14.7 % (ref 11.5–15.5)
WBC: 13.7 10*3/uL — AB (ref 4.0–10.5)

## 2017-05-19 LAB — GLUCOSE, CAPILLARY
GLUCOSE-CAPILLARY: 142 mg/dL — AB (ref 65–99)
GLUCOSE-CAPILLARY: 111 mg/dL — AB (ref 65–99)
GLUCOSE-CAPILLARY: 130 mg/dL — AB (ref 65–99)
Glucose-Capillary: 122 mg/dL — ABNORMAL HIGH (ref 65–99)

## 2017-05-19 MED ORDER — FLUCONAZOLE 100 MG PO TABS
100.0000 mg | ORAL_TABLET | Freq: Every day | ORAL | Status: AC
  Administered 2017-05-19 – 2017-05-20 (×2): 100 mg via ORAL
  Filled 2017-05-19 (×2): qty 1

## 2017-05-19 MED ORDER — METHOCARBAMOL 500 MG PO TABS: 500.0000 mg | ORAL_TABLET | Freq: Two times a day (BID) | ORAL | Status: DC | PRN

## 2017-05-19 MED ORDER — TRAZODONE HCL 50 MG PO TABS
100.0000 mg | ORAL_TABLET | Freq: Every day | ORAL | Status: DC
  Administered 2017-05-19 – 2017-05-22 (×4): 100 mg via ORAL
  Filled 2017-05-19 (×4): qty 2

## 2017-05-19 MED ORDER — METHOCARBAMOL 500 MG PO TABS
500.0000 mg | ORAL_TABLET | Freq: Every day | ORAL | Status: DC
  Administered 2017-05-19 – 2017-05-26 (×8): 500 mg via ORAL
  Filled 2017-05-19 (×9): qty 1

## 2017-05-19 MED ORDER — NYSTATIN 100000 UNIT/GM EX CREA
TOPICAL_CREAM | Freq: Three times a day (TID) | CUTANEOUS | Status: DC
  Administered 2017-05-19 – 2017-05-20 (×4): via TOPICAL
  Administered 2017-05-21: 1 via TOPICAL
  Administered 2017-05-21 – 2017-05-26 (×17): via TOPICAL
  Filled 2017-05-19 (×6): qty 15

## 2017-05-19 NOTE — Progress Notes (Signed)
Occupational Therapy Session Note  Patient Details  Name: William [name] MRN: [id number] Date of Birth: November 18, 1934  Today's Date: 05/19/2017 OT Individual Time: 6060-0459 OT Individual Time Calculation (min): 28 min    Short Term Goals: Week 2:  OT Short Term Goal 1 (Week 2): Continue working on established LTGs set at supervision level overall.    Skilled Therapeutic Interventions/Progress Updates:    Tx focus on standing balance and problem solving during functional tasks.   Pt greeted supine in bed, asleep, but easily woken. He was agreeable to tx. Supine<sit with supervision, pt donning belt and strapping bilateral indwelling catheter bags to it with min instruction and setup. Pt engaging in large print crossword puzzle activity while standing with RW at table in room (Min A sit<stand). Pt actively engaging in task with unilateral UE support on RW and min guard. At end of tx pt sidestepped towards [facility], returned to supine, able to elevate bilateral LEs back onto bed himself! Pt boosting himself up with use of headboard. He was left with all needs and daughter present at time of departure.   Therapy Documentation Precautions:  Precautions Precautions: Fall Precaution Comments: B Nephrostomy tubes Restrictions Weight Bearing Restrictions: No   Pain: No c/o pain during tx    ADL:      See Function Navigator for Current Functional Status.   Therapy/Group: Individual Therapy  Marsheila Alejo A Eliese Kerwood 05/19/2017, 3:46 PM

## 2017-05-19 NOTE — Progress Notes (Signed)
Speech Language Pathology Daily Session Note  Patient Details  Name: William [name] MRN: [id number] Date of Birth: Apr 02, 1934  Today's Date: 05/19/2017 SLP Individual Time: 0915-1000 SLP Individual Time Calculation (min): 45 min  Short Term Goals: Week 1: SLP Short Term Goal 1 (Week 1): Pt will consume current diet without overt s/s of aspiration and Mod I cues for use of compensatory swallowing strategies.  SLP Short Term Goal 2 (Week 1): Pt will utilize external memory aids to recall new, daily information with Mod A cues.  SLP Short Term Goal 3 (Week 1): Pt will consistently demosntrate O x 4 with Min A cues for use of external aids. SLP Short Term Goal 4 (Week 1): Pt will complete basic familiar tasks iwth Mod A verbal cues for funcitonal problem solving.  SLP Short Term Goal 5 (Week 1): Pt will identify 1 phsyical and 1 cognitive deficit that is related to acute illness with Mod A cues.   Skilled Therapeutic Interventions: skilled treatment session focused on self-care and education on results of MBS> SLP provided education on swallow function, aspiration and recommended diet following MBS. Education provided on water protocol and guidelines for pt to consume water. Pt's family with appropriate questions and all questions within SLP scope of practice answered and remaining questions relayed to PA. Education posted in room and left with family. Pt left in bed with family. Will continue to provide education on thickening liquids for better discharge planning.      Function:    Cognition Comprehension Comprehension assist level: Follows basic conversation/direction with extra time/assistive device  Expression   Expression assist level: Expresses basic 75 - 89% of the time/requires cueing 10 - 24% of the time. Needs helper to occlude trach/needs to repeat words.  Social Interaction Social Interaction assist level: Interacts appropriately 75 - 89% of the time - Needs redirection for  appropriate language or to initiate interaction.  Problem Solving Problem solving assist level: Solves basic 50 - 74% of the time/requires cueing 25 - 49% of the time  Memory Memory assist level: Recognizes or recalls 50 - 74% of the time/requires cueing 25 - 49% of the time;Recognizes or recalls 25 - 49% of the time/requires cueing 50 - 75% of the time    Pain    Therapy/Group: Individual Therapy  Kelcie Currie 05/19/2017, 11:49 AM

## 2017-05-19 NOTE — Progress Notes (Signed)
Modified Barium Swallow Progress Note  Patient Details  Name: William [name] MRN: [id number] Date of Birth: [birth year]  Today's Date: 05/19/2017  Modified Barium Swallow completed.  Full report located under Chart Review in the Imaging Section.  Brief recommendations include the following:  Clinical Impression  Pt presents iwth moderate sensorimotor pharyngeal dysphagia c/b sensed aspiration of thin liquids d/t delayed swallow initiaiton and mild residue in vallcula and pyriform sinuses that was aspirated after the swallow. Pt with constant premature spillage and delayed swallow initiation of thin liquids, nectar, honey, puree and dysphagia 2 textures. With thin liquids, pt with frank penetration during swallow and during second swallow or oral residuals. After swallow, residue from vallcula and pyriform sinuses spilled anteriorally and posteriorally into airway with aspiration of these residuals. Pt with delayed cough that was ineffectively clearing aspirated material. With spoon boluses of nectar, pt with better airway protection evidienced by swallow initiation at vallucula and the ability to contain residue safety within vallucular space. With large cups sips, pt with frank penetration to cords and no evidence of clearing penetrates. Pt with right sided weakness from CVA in 09/2016 and head turn to right was attempted iwth trials of nectar thick liquids by cup. However, this strategy didn't prevent frank penentration. Therefore, I recommend pt consume nectar thick liquids by spoon with dysphagia 3, medicine whole in puree and implement water protocol in between meals given sensed aspiration, risk of dehydration and no immediate history of pneumonia. Given family's report that pt's coughing began with CVA in 09/2016, pt's prognosis is guarded.    Swallow Evaluation Recommendations   Recommended Consults: Consider GI evaluation   SLP Diet Recommendations: Dysphagia 3 (Mech soft) solids;Nectar thick  liquid;No mixed consistencies (Water protocol)   Liquid Administration via: Spoon   Medication Administration: Whole meds with puree   Supervision: Patient able to self feed;Intermittent supervision to cue for compensatory strategies   Compensations: Minimize environmental distractions;Slow rate;Small sips/bites   Postural Changes: Seated upright at 90 degrees   Oral Care Recommendations: Oral care BID;Staff/trained caregiver to provide oral care   Other Recommendations: Order thickener from pharmacy;Prohibited food (jello, ice cream, thin soups);Remove water pitcher;Have oral suction available    Leroi Haque 05/19/2017,11:45 AM

## 2017-05-19 NOTE — Progress Notes (Signed)
[facility] PHYSICAL MEDICINE & REHABILITATION     PROGRESS NOTE  Subjective/Complaints:  Pt seen laying in bed this AM.  Per nursing, pt did not have any complaints overnight, however, daughter stated he was in pain all night.  Daughter again states she feels she is not receiving communication regarding medical condition, despite PA, nursing, and case manager spending significant time with daughter. Today, daughters main complain is adjustment in sleep medications, stating patient hasn't slept for weeks, despite son stating patient slept well 2 night ago.  Daughter very anxious.   ROS: Denies CP, SOB, N/V/D.  Objective: Vital Signs: Blood pressure 134/67, pulse 73, temperature 98 F (36.7 C), temperature source Axillary, resp. rate 18, height 6' 1.5" (1.867 m), weight 90.6 kg (199 lb 12.8 oz), SpO2 98 %. No results found.  Recent Labs  05/17/17 0911 05/19/17 0419  WBC 15.3* 13.7*  HGB 12.0* 11.4*  HCT 37.6* 34.6*  PLT 366 361    Recent Labs  05/17/17 0911 05/19/17 0419  NA 136 139  K 3.9 3.8  CL 99* 103  GLUCOSE 143* 130*  BUN 25* 17  CREATININE 1.32* 1.23  CALCIUM 8.8* 8.7*   CBG (last 3)   Recent Labs  05/18/17 1647 05/18/17 2203 05/19/17 0640  GLUCAP 136* 128* 122*    Wt Readings from Last 3 Encounters:  05/19/17 90.6 kg (199 lb 12.8 oz)  05/12/17 92.5 kg (204 lb)  01/05/17 104.3 kg (230 lb)    Physical Exam:  BP 134/67 (BP Location: Right Arm)   Pulse 73   Temp 98 F (36.7 C) (Axillary)   Resp 18   Ht 6' 1.5" (1.867 m)   Wt 90.6 kg (199 lb 12.8 oz)   SpO2 98%   BMI 26.00 kg/m  Constitutional: He appears well-developed. Vitalsreviewed. NAD. HENT: Normocephalic. Atraumatic Eyes: EOMare normal. No discharge.  Cardiovascular: IRIR Respiratory: Effort normal and breath sounds normal.  GI: Soft. Bowel sounds are normal.  Neurological: He is alert.  Delay in processing.  HOH Motor: Grossly 5/5 b/l UE B/l LE 4+/5 RLE, 4+/5 LLE  Skin. Warm and  dry. Intact.  Assessment/Plan: 1. Functional deficits secondary to bilateral hydronephrosis status post stenting 05/09/2017 as well as bilateral nephrostomy tubes which require 3+ hours per day of interdisciplinary therapy in a comprehensive inpatient rehab setting. Physiatrist is providing close team supervision and 24 hour management of active medical problems listed below. Physiatrist and rehab team continue to assess barriers to discharge/monitor patient progress toward functional and medical goals.  Function:  Bathing Bathing position   Position: Shower  Bathing parts Body parts bathed by patient: Right arm, Left arm, Chest, Abdomen, Right upper leg, Left upper leg, Right lower leg, Left lower leg, Front perineal area Body parts bathed by helper: Buttocks, Back  Bathing assist Assist Level: Touching or steadying assistance(Pt > 75%)      Upper Body Dressing/Undressing Upper body dressing   What is the patient wearing?: Pull over shirt/dress     Pull over shirt/dress - Perfomed by patient: Thread/unthread left sleeve, Put head through opening, Pull shirt over trunk, Thread/unthread right sleeve          Upper body assist Assist Level: Supervision or verbal cues      Lower Body Dressing/Undressing Lower body dressing   What is the patient wearing?: Pants, Underwear, Non-skid slipper socks Underwear - Performed by patient: Thread/unthread right underwear leg, Thread/unthread left underwear leg, Pull underwear up/down   Pants- Performed by patient: Thread/unthread right  pants leg, Thread/unthread left pants leg, Pull pants up/down   Non-skid slipper socks- Performed by patient: Don/doff right sock Non-skid slipper socks- Performed by helper: Don/doff right sock, Don/doff left sock Socks - Performed by patient: Don/doff right sock, Don/doff left sock   Shoes - Performed by patient: Don/doff right shoe, Don/doff left shoe, Fasten right, Fasten left            Lower body  assist Assist for lower body dressing: Touching or steadying assistance (Pt > 75%)      Toileting Toileting Toileting activity did not occur: No continent bowel/bladder event Toileting steps completed by patient: Adjust clothing prior to toileting, Performs perineal hygiene Toileting steps completed by helper: Adjust clothing prior to toileting Toileting Assistive Devices: Grab bar or rail, Toilet aid  Toileting assist Assist level: Touching or steadying assistance (Pt.75%) (per Elby Beck, NT)   Transfers Chair/bed transfer   Chair/bed transfer method: Stand pivot Chair/bed transfer assist level: Touching or steadying assistance (Pt > 75%) Chair/bed transfer assistive device: Armrests, Medical sales representative     Max distance: 10' Assist level: Touching or steadying assistance (Pt > 75%)   Wheelchair   Type: Manual Max wheelchair distance: 125 Assist Level: Supervision or verbal cues  Cognition Comprehension Comprehension assist level: Follows basic conversation/direction with no assist  Expression Expression assist level: Expresses basic 75 - 89% of the time/requires cueing 10 - 24% of the time. Needs helper to occlude trach/needs to repeat words.  Social Interaction Social Interaction assist level: Interacts appropriately 75 - 89% of the time - Needs redirection for appropriate language or to initiate interaction.  Problem Solving Problem solving assist level: Solves basic 50 - 74% of the time/requires cueing 25 - 49% of the time  Memory Memory assist level: Recognizes or recalls 50 - 74% of the time/requires cueing 25 - 49% of the time    Medical Problem List and Plan: 1. Debilitation secondary to acute renal failure with bilateral hydronephrosis status post stenting 05/09/2017 as well as bilateral nephrostomy tubes 05/10/2017  Cont CIR  Will follow up with neprho regarding tubes next week 2. DVT Prophylaxis/Anticoagulation:Chronic Eliquis 3. Pain Management:  Tylenol 4. Mood/delirium: Trazodone 50 mg daily at bedtime, Ativan 1 mg as needed anxiety/sleep 5. Neuropsych: This patient iscapable of making decisions on hisown behalf. 6. Skin/Wound Care: Routine skin checks 7. Fluids/Electrolytes/Nutrition: Routine I&Os  Diet liberalized per daughter request 8.AKI/hypernatremiafelt to be secondary to obstruction.Follow-up renal services.  Cr 1.23 on 6/22  Cont to monitor 9.Hypertension. Norvasc 5 mg daily at bedtime  Controlled 6/22 10.Atrial fibrillation.Eliquisresumed. Cardiac rate control 11.Coag-negative staph UTI.   Blood cultures NG.   Repeat urine culture NG.   IV rocephin- will continue for now and consider d/cing next week 12.Type 2 diabetes mellitus. Hemoglobin A1c 6.8. Sliding scale insulin.   Levemir 6 units started on 6/19 (home 16 units)  Improving 13.History of prostate cancer and radiation therapy 14.Hyperlipidemia. Pravachol 15. Hypoalbuminemia  Supplement initiated  16. Leukocytosis  WBCs 13.7 on 6/22  Improving  Labs ordered for Monday  Afrebrile  Cont to monitor  CXR reviewed 6/18, relatively unremarkable 17. ABLA  Hb 11.4 on 6/22  Cont to monitor  Labs ordered for Monday  >35 minutes spent with patient and daughter with >25 minutes with counseling and coordination of care regarding abx, sleep medications, back pain, overall medical care  LOS (Days) 6 A FACE TO FACE EVALUATION WAS PERFORMED  Kirsty Monjaraz Lorie Phenix 05/19/2017 9:17 AM

## 2017-05-19 NOTE — Progress Notes (Signed)
ST discussed patient's MBS. She feels that he has been likely aspirating since last stroke with family reporting cough with meals as well as well weight loss. Current plans to place patient on nectars by tsp with meals and water protocol between meals. Family concerned about ability to maintain hydration. Will monitor labs serially.  He has not any lung issues since stroke and last CXR was clear.

## 2017-05-19 NOTE — Progress Notes (Signed)
Occupational Therapy Weekly Progress Note  Patient Details  Name: William [name] MRN: [id number] Date of Birth: 11/23/1934  Beginning of progress report period: May 14, 2017 End of progress report period: May 19, 2017  Today's Date: 05/19/2017 OT Individual Time: 1016-1130 OT Individual Time Calculation (min): 74 min    Mr. Lazar is making steady progress with OT at this time.  He continues to need min assist level for sit to stand and functional transfers, but once in standing he can ambulate to the shower and toilet with min guard assist.  Oxygen sats have remained in the upper 90s during therapy on room air with HR increasing to the lower 120s at times.  He continues to demonstrate some dyspnea at 3/4 with activity.  UB selfcare is currently at a supervision level overall with LB dressing at min assist.  Feel he will likely still need supervision 24 hour at discharge, which is expected late next week.  Goals will be downgraded to reflect this as I do not feel he can reach modified independent for toileting and dressing.    Patient continues to demonstrate the following deficits: muscle weakness, decreased cardiorespiratoy endurance and decreased standing balance and decreased balance strategies and therefore will continue to benefit from skilled OT intervention to enhance overall performance with BADL and Reduce care partner burden.  Patient not progressing toward long term goals.  See goal revision..  Continue plan of care.  OT Short Term Goals Week 2:  OT Short Term Goal 1 (Week 2): Continue working on established LTGs set at supervision level overall.    Skilled Therapeutic Interventions/Progress Updates:    Pt completed bathing and dressing during session.  Min assist for transfer to the shower with RW to start session.  Min assist for bathing sit to stand on the shower seat.  He was able to transfer out to the wheelchair at the sink for dressing.  Encouraged pt to start with the right  side first, crossing the RLE over the left knee to assist with reaching the foot.  He needs use of his UEs for crossing both LEs during LB dressing tasks.  Transferred to bedside recliner with min assist to complete session.  Pt's spouse and her daughter present for session as well.  Discussed the need for higher surfaces for sit to stand at home.    Therapy Documentation Precautions:  Precautions Precautions: Fall Precaution Comments: B Nephrostomy tubes Restrictions Weight Bearing Restrictions: No   Pain: Pain Assessment Pain Assessment: No/denies pain ADL: See Function Navigator for Current Functional Status.   Therapy/Group: Individual Therapy  Arelis Neumeier OTR/L 05/19/2017, 12:45 PM

## 2017-05-19 NOTE — Progress Notes (Addendum)
Physical Therapy Session Note  Patient Details  Name: William [name] MRN: [id number] Date of Birth: 09-20-1934  Today's Date: 05/19/2017 PT Individual Time: 0086-7619 PT Individual Time Calculation (min): 79 min   Short Term Goals: Week 1:  PT Short Term Goal 1 (Week 1): Pt will increasebed mobility to S.  PT Short Term Goal 2 (Week 1): Pt will increase transfers bed to chair to S.  PT Short Term Goal 3 (Week 1): Pt will ambulate with rolling walker and S about 150 feet. PT Short Term Goal 4 (Week 1): Pt will ascend/descend 2 step x 2 with rolling walker.   Skilled Therapeutic Interventions/Progress Updates:  Pt received in bed & agreeable to tx. Session focused on activity tolerance, BLE strengthening, and functional mobility. Therapist donned BLE ted hose total assist. Pt required min assist to upright trunk during supine>sit with HOB elevated & bed rails. Throughout session pt completed sit<>stand transfers with mod fading to min assist as therapist continued to educate him on B hand placement on armrests. Pt ambulated 50 ft + 110 ft + 40 ft with steady assist & RW before requiring rest breaks. Pt utilized cybex kinetron in sitting, up to 70 cm/sec for BLE strengthening. Pt only able to participate for ~30 seconds at a time 2/2 fatigue & R quad cramping. Discussed home entry & pt reports he has 1 step + 1 step to enter home. Pt declined attempt at single stair negotiation with RW 2/2 BLE and overall fatigue, but therapist provided demo & instruction on task. Pt propelled w/c back to room with BUE & supervision for cardiopulmonary endurance training. Pt transferred to bed & doffed shorts & underwear with min assist for standing balance. Pt required mod assist to transfer sit>supine. Pt left in bed in care of RN.  Pt required multiple prolonged seated rest breaks 2/2 fatigue following busy day in therapy.  Therapy Documentation Precautions:  Precautions Precautions: Fall Precaution Comments: B  Nephrostomy tubes Restrictions Weight Bearing Restrictions: No  Pain: Denied c/o pain.  See Function Navigator for Current Functional Status.   Therapy/Group: Individual Therapy  [name] 05/19/2017, 3:48 PM

## 2017-05-20 ENCOUNTER — Inpatient Hospital Stay (HOSPITAL_COMMUNITY): Payer: Medicare Other | Admitting: Speech Pathology

## 2017-05-20 ENCOUNTER — Inpatient Hospital Stay (HOSPITAL_COMMUNITY): Payer: Medicare Other | Admitting: Physical Therapy

## 2017-05-20 ENCOUNTER — Inpatient Hospital Stay (HOSPITAL_COMMUNITY): Payer: Medicare Other | Admitting: Occupational Therapy

## 2017-05-20 DIAGNOSIS — D631 Anemia in chronic kidney disease: Secondary | ICD-10-CM

## 2017-05-20 DIAGNOSIS — N182 Chronic kidney disease, stage 2 (mild): Secondary | ICD-10-CM

## 2017-05-20 LAB — GLUCOSE, CAPILLARY
GLUCOSE-CAPILLARY: 132 mg/dL — AB (ref 65–99)
Glucose-Capillary: 93 mg/dL (ref 65–99)
Glucose-Capillary: 140 mg/dL — ABNORMAL HIGH (ref 65–99)
Glucose-Capillary: 129 mg/dL — ABNORMAL HIGH (ref 65–99)

## 2017-05-20 LAB — BASIC METABOLIC PANEL
Anion gap: 8 (ref 5–15)
BUN: 17 mg/dL (ref 6–20)
CALCIUM: 8.5 mg/dL — AB (ref 8.9–10.3)
CHLORIDE: 104 mmol/L (ref 101–111)
CO2: 27 mmol/L (ref 22–32)
CREATININE: 1.21 mg/dL (ref 0.61–1.24)
GFR calc non Af Amer: 55 mL/min — ABNORMAL LOW (ref >60)
GLUCOSE: 117 mg/dL — AB (ref 65–99)
Potassium: 3.8 mmol/L (ref 3.5–5.1)
Sodium: 139 mmol/L (ref 135–145)

## 2017-05-20 MED ORDER — RESOURCE THICKENUP CLEAR PO POWD
ORAL | Status: DC | PRN
  Filled 2017-05-20: qty 125

## 2017-05-20 NOTE — Progress Notes (Signed)
Speech Language Pathology Daily Session Note  Patient Details  Name: William [name] MRN: [id number] Date of Birth: 01/06/1934  Today's Date: 05/20/2017 SLP Individual Time: 1430-1510 SLP Individual Time Calculation (min): 40 min  Short Term Goals: Week 1: SLP Short Term Goal 1 (Week 1): Pt will consume current diet without overt s/s of aspiration and Mod I cues for use of compensatory swallowing strategies.  SLP Short Term Goal 2 (Week 1): Pt will utilize external memory aids to recall new, daily information with Mod A cues.  SLP Short Term Goal 3 (Week 1): Pt will consistently demosntrate O x 4 with Min A cues for use of external aids. SLP Short Term Goal 4 (Week 1): Pt will complete basic familiar tasks iwth Mod A verbal cues for funcitonal problem solving.  SLP Short Term Goal 5 (Week 1): Pt will identify 1 phsyical and 1 cognitive deficit that is related to acute illness with Mod A cues.   Skilled Therapeutic Interventions: Skilled treatment session focused on dysphagia goals. Upon arrival, patient was awake while upright in recliner with wife present. Patient's wife had extensive questions in regards to patient's current swallowing function, diet recommendations, appropriate textures procedures of water protocol and prognosis/timeline for returning to thin liquids. All questions were answered to patient's satisfaction but suspect she will need reinforcement of information. SLP also demonstrated and observed patient's wife independently thickening liquids to the appropriate nectar-thick consistency. Patient consumed nectar-thick liquids via tsp without overt s/s of aspiration. Patient transferred to bed at end of session. Patient left supine in bed with wife present. Continue with current plan of care.      Function:  Eating Eating   Modified Consistency Diet: Yes Eating Assist Level: Supervision or verbal cues           Cognition Comprehension Comprehension assist level: Follows  basic conversation/direction with extra time/assistive device  Expression   Expression assist level: Set-up assist with assistive device  Social Interaction Social Interaction assist level: Interacts appropriately with others - No medications needed.  Problem Solving Problem solving assist level: Solves complex problems: With extra time  Memory Memory assist level: Recognizes or recalls 75 - 89% of the time/requires cueing 10 - 24% of the time    Pain No/Denies Pain   Therapy/Group: Individual Therapy  William [name] 05/20/2017, 3:36 PM

## 2017-05-20 NOTE — Plan of Care (Signed)
Problem: RH Dressing Goal: LTG Patient will perform lower body dressing w/assist (OT) LTG: Patient will perform lower body dressing with assist, with/without cues in positioning using equipment (OT)  Goal downgraded based on progress.  Problem: RH Toileting Goal: LTG Patient will perform toileting w/assist, cues/equip (OT) LTG: Patient will perform toiletiing (clothes management/hygiene) with assist, with/without cues using equipment (OT)  Goal downgraded based on progress  Problem: RH Simple Meal Prep Goal: LTG Patient will perform simple meal prep w/assist (OT) LTG: Patient will perform simple meal prep with assistance, with/without cues (OT).  Outcome: Not Applicable Date Met: 65/46/50 Goal discharged

## 2017-05-20 NOTE — Progress Notes (Signed)
Per family, patient can be off CPAP tonight for comfort. Will monitor.

## 2017-05-20 NOTE — Progress Notes (Signed)
PHYSICAL MEDICINE & REHABILITATION     PROGRESS NOTE  Subjective/Complaints:  Daughters in room asking questions about labs  ROS: Denies CP, SOB, N/V/D.  Objective: Vital Signs: Blood pressure (!) 116/59, pulse 71, temperature 97.8 F (36.6 C), temperature source Oral, resp. rate 18, height 6' 1.5" (1.867 m), weight 90.6 kg (199 lb 12.8 oz), SpO2 98 %. Dg Swallowing Func-speech Pathology  Result Date: 05/19/2017 Objective Swallowing Evaluation: Type of Study: MBS-Modified Barium Swallow Study Patient Details Name: William [name] MRN: [id number] Date of Birth: [birth year] Today's Date: 05/19/2017 Time: 0938-1829 No Data Recorded Past Medical History: Past Medical History: Diagnosis Date . Arthritis  . Diabetes mellitus without complication ([place])  . Hypercholesteremia  . Hypertension  . Kidney stones  . Prostate cancer [facility])   with radiation treatment . Sleep apnea 2/14  "mild per patient" hasnt gotten CPAP machine . Stroke [facility])  Past Surgical History: Past Surgical History: Procedure Laterality Date . ADENOIDECTOMY   . CYSTOSCOPY W/ URETERAL STENT PLACEMENT Bilateral 05/07/2017  Procedure: CYSTOSCOPY WITH BILATERAL RETROGRADE PYELOGRAM CYSTOGRAM BILATERAL URETERAL STENT PLACEMENT;  Surgeon: Festus Aloe, MD;  Location: [facility];  Service: Urology;  Laterality: Bilateral; . FLEXIBLE SIGMOIDOSCOPY N/A 09/12/2013  Procedure: FLEXIBLE SIGMOIDOSCOPY;  Surgeon: Milus Banister, MD;  Location: [facility];  Service: Endoscopy;  Laterality: N/A; . HERNIA REPAIR   . HOT HEMOSTASIS N/A 09/12/2013  Procedure: HOT HEMOSTASIS (ARGON PLASMA COAGULATION/BICAP);  Surgeon: Milus Banister, MD;  Location: [facility];  Service: Endoscopy;  Laterality: N/A; . IR NEPHROSTOMY PLACEMENT LEFT  05/09/2017 . IR NEPHROSTOMY PLACEMENT RIGHT  05/09/2017 . LIPOMA EXCISION   . OTHER SURGICAL HISTORY    s/p prostate radiation . TONSILLECTOMY   . TOTAL KNEE ARTHROPLASTY Left 03/25/2014  Procedure: LEFT TOTAL KNEE ARTHROPLASTY;   Surgeon: [name], MD;  Location: [facility];  Service: Orthopedics;  Laterality: Left; . TOTAL KNEE ARTHROPLASTY Right 05/06/2014  Procedure: RIGHT TOTAL KNEE ARTHROPLASTY;  Surgeon: [name], MD;  Location: [facility];  Service: Orthopedics;  Laterality: Right; No Data Recorded No Data Recorded Assessment / Plan / Recommendation CHL IP CLINICAL IMPRESSIONS 05/19/2017 Clinical Impression Pt presents with moderate sensorimotor pharyngeal dysphagia c/b sensed aspiration of thin liquids d/t delayed swallow initiation and mild residue in vallecula and pyriform sinuses that was aspirated after the swallow. Pt with consistent premature spillage and delayed swallow initiation of thin liquids, nectar thick liquids, honey thick liquids, puree and dysphagia 2 textures. With thin liquids, pt demonstrated frank penetration during swallow and during second swallow of oral residuals. After swallow, residue from vallecula and pyriform sinuses spilled anteriorally and posteriorally into airway with aspiration of these residuals. Pt with delayed cough that was ineffective in clearing aspirated material. With spoon boluses of nectar thick liquids, pt with better airway protection evidenced by swallow initiation at vallecula and the ability to contain residue safety within vallecular space. With large cup sips of nectar thick liquids, pt with frank penetration to cords and no evidence of clearing penetrates. Pt with right sided weakness from CVA in 09/2016 and therefore head turn to right was attempted with trials of nectar thick liquids by cup. However, this strategy didn't prevent frank penetration. Therefore, I recommend pt consume nectar thick liquids by spoon with dysphagia 3 diet, medicine whole in puree and implement water protocol in between meals given sensed aspiration, risk of dehydration and no immediate history of pneumonia. Given family's report that pt's coughing began with CVA in 09/2016, pt's prognosis is  guarded.   SLP Visit Diagnosis Dysphagia, oropharyngeal phase (R13.12) Attention and concentration deficit following -- Frontal lobe and executive function deficit following -- Impact on safety and function Moderate aspiration risk   No flowsheet data found.  No flowsheet data found. CHL IP DIET RECOMMENDATION 05/19/2017 SLP Diet Recommendations Dysphagia 3 (Mech soft) solids;Nectar thick liquid;No mixed consistencies Liquid Administration via Spoon Medication Administration Whole meds with puree Compensations Minimize environmental distractions;Slow rate;Small sips/bites Postural Changes Seated upright at 90 degrees   CHL IP OTHER RECOMMENDATIONS 05/19/2017 Recommended Consults Consider GI evaluation Oral Care Recommendations Oral care BID;Staff/trained caregiver to provide oral care Other Recommendations Order thickener from pharmacy;Prohibited food (jello, ice cream, thin soups);Remove water pitcher;Have oral suction available   CHL IP FOLLOW UP RECOMMENDATIONS 10/27/2016 Follow up Recommendations None   No flowsheet data found.     CHL IP ORAL PHASE 05/19/2017 Oral Phase -- Oral - Pudding Teaspoon -- Oral - Pudding Cup -- Oral - Honey Teaspoon -- Oral - Honey Cup -- Oral - Nectar Teaspoon -- Oral - Nectar Cup -- Oral - Nectar Straw -- Oral - Thin Teaspoon Incomplete tongue to palate contact;Premature spillage Oral - Thin Cup Incomplete tongue to palate contact;Premature spillage Oral - Thin Straw Incomplete tongue to palate contact;Premature spillage Oral - Puree Premature spillage;Incomplete tongue to palate contact Oral - Mech Soft Premature spillage;Incomplete tongue to palate contact Oral - Regular -- Oral - Multi-Consistency -- Oral - Pill Premature spillage;Incomplete tongue to palate contact Oral Phase - Comment --  CHL IP PHARYNGEAL PHASE 05/19/2017 Pharyngeal Phase -- Pharyngeal- Pudding Teaspoon -- Pharyngeal -- Pharyngeal- Pudding Cup -- Pharyngeal -- Pharyngeal- Honey Teaspoon -- Pharyngeal -- Pharyngeal- Honey Cup  -- Pharyngeal -- Pharyngeal- Nectar Teaspoon -- Pharyngeal -- Pharyngeal- Nectar Cup Penetration/Apiration after swallow Pharyngeal Material enters airway, remains ABOVE vocal cords and not ejected out Pharyngeal- Nectar Straw -- Pharyngeal -- Pharyngeal- Thin Teaspoon Delayed swallow initiation-vallecula;Reduced tongue base retraction;Penetration/Aspiration during swallow;Penetration/Apiration after swallow;Trace aspiration;Pharyngeal residue - valleculae;Pharyngeal residue - pyriform;Pharyngeal residue - cp segment Pharyngeal Material enters airway, passes BELOW cords and not ejected out despite cough attempt by patient Pharyngeal- Thin Cup Delayed swallow initiation-vallecula;Reduced tongue base retraction;Penetration/Aspiration during swallow;Penetration/Apiration after swallow;Trace aspiration;Pharyngeal residue - valleculae;Pharyngeal residue - pyriform;Pharyngeal residue - cp segment Pharyngeal Material enters airway, passes BELOW cords and not ejected out despite cough attempt by patient Pharyngeal- Thin Straw Delayed swallow initiation-vallecula;Reduced tongue base retraction;Penetration/Aspiration during swallow;Penetration/Apiration after swallow;Trace aspiration;Pharyngeal residue - cp segment;Pharyngeal residue - pyriform;Pharyngeal residue - valleculae Pharyngeal Material enters airway, passes BELOW cords and not ejected out despite cough attempt by patient Pharyngeal- Puree Delayed swallow initiation-vallecula;Reduced tongue base retraction;Pharyngeal residue - valleculae Pharyngeal -- Pharyngeal- Mechanical Soft Delayed swallow initiation-vallecula;Reduced tongue base retraction;Pharyngeal residue - valleculae Pharyngeal -- Pharyngeal- Regular -- Pharyngeal -- Pharyngeal- Multi-consistency -- Pharyngeal -- Pharyngeal- Pill Delayed swallow initiation-vallecula;Penetration/Aspiration during swallow;Penetration/Apiration after swallow;Trace aspiration;Pharyngeal residue - valleculae;Pharyngeal residue -  pyriform;Pharyngeal residue - cp segment Pharyngeal Material enters airway, passes BELOW cords and not ejected out despite cough attempt by patient Pharyngeal Comment Compensatory strategies, head turn and cough were ineffective  CHL IP CERVICAL ESOPHAGEAL PHASE 05/19/2017 Cervical Esophageal Phase Impaired Pudding Teaspoon -- Pudding Cup -- Honey Teaspoon -- Honey Cup -- Nectar Teaspoon -- Nectar Cup -- Nectar Straw -- Thin Teaspoon -- Thin Cup -- Thin Straw -- Puree -- Mechanical Soft -- Regular -- Multi-consistency -- Pill -- Cervical Esophageal Comment -- CHL IP GO 10/27/2016 Functional Assessment Tool Used clinician judgement Functional Limitations Motor speech Swallow Current Status (  G8996) (None) Swallow Goal Status (Z6109) (None) Swallow Discharge Status [phone]) (None) Motor Speech Current Status [phone]) [place] Motor Speech Goal Status (B1478) [place] Motor Speech Goal Status [phone]) [place] Spoken Language Comprehension Current Status [phone]) (None) Spoken Language Comprehension Goal Status (V7846) (None) Spoken Language Comprehension Discharge Status [phone]) (None) Spoken Language Expression Current Status [phone]) (None) Spoken Language Expression Goal Status [phone]) (None) Spoken Language Expression Discharge Status [phone]) (None) Attention Current Status (D6644) (None) Attention Goal Status (I3474) (None) Attention Discharge Status [phone]) (None) Memory Current Status (L8756) (None) Memory Goal Status (E3329) (None) Memory Discharge Status (J1884) (None) Voice Current Status [phone]) (None) Voice Goal Status (T0160) (None) Voice Discharge Status [phone]) (None) Other Speech-Language Pathology Functional Limitation Current Status [phone]) (None) Other Speech-Language Pathology Functional Limitation Goal Status [phone]) (None) Other Speech-Language Pathology Functional Limitation Discharge Status [phone]) (None) Happi Overton 05/19/2017, 11:41 AM               Recent Labs  05/17/17 0911 05/19/17 0419  WBC 15.3* 13.7*   HGB 12.0* 11.4*  HCT 37.6* 34.6*  PLT 366 361    Recent Labs  05/19/17 0419 05/20/17 0422  NA 139 139  K 3.8 3.8  CL 103 104  GLUCOSE 130* 117*  BUN 17 17  CREATININE 1.23 1.21  CALCIUM 8.7* 8.5*   CBG (last 3)   Recent Labs  05/19/17 1136 05/19/17 1648 05/19/17 2113  GLUCAP 142* 111* 130*    Wt Readings from Last 3 Encounters:  05/19/17 90.6 kg (199 lb 12.8 oz)  05/12/17 92.5 kg (204 lb)  01/05/17 104.3 kg (230 lb)    Physical Exam:  BP (!) 116/59 (BP Location: Right Arm)   Pulse 71   Temp 97.8 F (36.6 C) (Oral)   Resp 18   Ht 6' 1.5" (1.867 m)   Wt 90.6 kg (199 lb 12.8 oz)   SpO2 98%   BMI 26.00 kg/m  Constitutional: He appears well-developed. Vitalsreviewed. NAD. HENT: Normocephalic. Atraumatic Eyes: EOMare normal. No discharge.  Cardiovascular: IRIR Respiratory: Effort normal and breath sounds normal.  GI: Soft. Bowel sounds are normal.  Neurological: He is alert.  Delay in processing.  HOH Motor: Grossly 5/5 b/l UE B/l LE 4+/5 RLE, 4+/5 LLE  Skin. Warm and dry. Intact.  Assessment/Plan: 1. Functional deficits secondary to bilateral hydronephrosis status post stenting 05/09/2017 as well as bilateral nephrostomy tubes which require 3+ hours per day of interdisciplinary therapy in a comprehensive inpatient rehab setting. Physiatrist is providing close team supervision and 24 hour management of active medical problems listed below. Physiatrist and rehab team continue to assess barriers to discharge/monitor patient progress toward functional and medical goals.  Function:  Bathing Bathing position   Position: Shower  Bathing parts Body parts bathed by patient: Right arm, Left arm, Chest, Abdomen, Right upper leg, Left upper leg, Right lower leg, Left lower leg, Front perineal area, Buttocks Body parts bathed by helper: Back  Bathing assist Assist Level: Touching or steadying assistance(Pt > 75%)      Upper Body Dressing/Undressing Upper  body dressing   What is the patient wearing?: Pull over shirt/dress     Pull over shirt/dress - Perfomed by patient: Thread/unthread left sleeve, Put head through opening, Pull shirt over trunk, Thread/unthread right sleeve          Upper body assist Assist Level: Supervision or verbal cues      Lower Body Dressing/Undressing Lower body dressing   What is the patient wearing?: Pants, Underwear, Non-skid slipper  socks, Socks, Shoes Underwear - Performed by patient: Thread/unthread right underwear leg, Thread/unthread left underwear leg, Pull underwear up/down   Pants- Performed by patient: Thread/unthread right pants leg, Thread/unthread left pants leg, Pull pants up/down   Non-skid slipper socks- Performed by patient: Don/doff right sock Non-skid slipper socks- Performed by helper: Don/doff right sock, Don/doff left sock Socks - Performed by patient: Don/doff right sock, Don/doff left sock   Shoes - Performed by patient: Don/doff right shoe, Don/doff left shoe, Fasten right, Fasten left            Lower body assist Assist for lower body dressing: Touching or steadying assistance (Pt > 75%)      Toileting Toileting Toileting activity did not occur: No continent bowel/bladder event Toileting steps completed by patient: Adjust clothing prior to toileting, Performs perineal hygiene, Adjust clothing after toileting Toileting steps completed by helper: Adjust clothing prior to toileting Toileting Assistive Devices: Grab bar or rail, Toilet aid  Toileting assist Assist level: Touching or steadying assistance (Pt.75%)   Transfers Chair/bed transfer   Chair/bed transfer method: Ambulatory Chair/bed transfer assist level: Touching or steadying assistance (Pt > 75%) Chair/bed transfer assistive device: Medical sales representative     Max distance: 110 ft Assist level: Touching or steadying assistance (Pt > 75%)   Wheelchair   Type: Manual Max wheelchair distance: 100  ft Assist Level: Supervision or verbal cues  Cognition Comprehension Comprehension assist level: Follows basic conversation/direction with extra time/assistive device  Expression Expression assist level: Expresses basic 75 - 89% of the time/requires cueing 10 - 24% of the time. Needs helper to occlude trach/needs to repeat words.  Social Interaction Social Interaction assist level: Interacts appropriately 75 - 89% of the time - Needs redirection for appropriate language or to initiate interaction.  Problem Solving Problem solving assist level: Solves basic 75 - 89% of the time/requires cueing 10 - 24% of the time  Memory Memory assist level: Recognizes or recalls 75 - 89% of the time/requires cueing 10 - 24% of the time    Medical Problem List and Plan: 1. Debilitation secondary to acute renal failure with bilateral hydronephrosis status post stenting 05/09/2017 as well as bilateral nephrostomy tubes 05/10/2017  Cont CIR  Will follow up with neprho regarding tubes next week 2. DVT Prophylaxis/Anticoagulation:Chronic Eliquis 3. Pain Management: Tylenol 4. Mood/delirium: Trazodone 50 mg daily at bedtime, Ativan 1 mg as needed anxiety/sleep 5. Neuropsych: This patient iscapable of making decisions on hisown behalf. 6. Skin/Wound Care: Routine skin checks 7. Fluids/Electrolytes/Nutrition: Routine I&Os  Diet liberalized per daughter request 8.AKI/hypernatremiafelt to be secondary to obstruction.Follow-up renal services.  Cr 1.23 on 6/22  Cont to monitor 9.Hypertension. Norvasc 5 mg daily at bedtime  Controlled 6/22 Vitals:   05/19/17 1647 05/20/17 0439  BP: (!) 118/54 (!) 116/59  Pulse: 67 71  Resp: 18 18  Temp: 97.8 F (36.6 C) 97.8 F (36.6 C)   10.Atrial fibrillation.Eliquisresumed. Cardiac rate control 11.Coag-negative staph UTI.   Blood cultures NG.   Repeat urine culture NG.   IV rocephin- will continue for now and consider d/cing next week 12.Type 2 diabetes  mellitus. Hemoglobin A1c 6.8. Sliding scale insulin.   Levemir 6 units started on 6/19 (home 16 units)  Controlled 6/23 CBG (last 3)   Recent Labs  05/19/17 1136 05/19/17 1648 05/19/17 2113  GLUCAP 142* 111* 130*    13.History of prostate cancer and radiation therapy 14.Hyperlipidemia. Pravachol 15. Hypoalbuminemia  Supplement initiated  16. Leukocytosis  WBCs 13.7 on 6/22- discussed trend with daughter  Improving  CBC ordered for Monday  Afrebrile  Cont to monitor  CXR reviewed 6/18, relatively unremarkable 17. ABLA  Hb 11.4 on 6/22  Cont to monitor  CBC ordered for Monday    LOS (Days) 7 A FACE TO FACE EVALUATION WAS PERFORMED  Alysia Penna E 05/20/2017 6:30 AM

## 2017-05-20 NOTE — Progress Notes (Signed)
Physical Therapy Session Note  Patient Details  Name: William [name] MRN: [id number] Date of Birth: [birth year]  Today's Date: 05/20/2017 PT Individual Time: 1303-1400 PT Individual Time Calculation (min): 57 min   Short Term Goals: Week 1:  PT Short Term Goal 1 (Week 1): Pt will increasebed mobility to S.  PT Short Term Goal 2 (Week 1): Pt will increase transfers bed to chair to S.  PT Short Term Goal 3 (Week 1): Pt will ambulate with rolling walker and S about 150 feet. PT Short Term Goal 4 (Week 1): Pt will ascend/descend 2 step x 2 with rolling walker.   Skilled Therapeutic Interventions/Progress Updates: Pt presented in recliner asleep but easily aroused. Performed sit to stand minA cues for anterior wt shift and avoiding pushing BLE forcefully against recliner. Pt ambulated approx 31ft with decreased step length and forward flexed posture. Pt then stating feeling "wobbly" and request to sit. BP checked 146/85. Pt then propelled using BLE to rehab gym as feeling better. NuStep L1  X 6 min for endurance. Continued reinforcement transfer training x5 with improved technique. Pt ambulated 144 ft with close supervision and improved step length and posture. Pt returned to recliner at end of session with chair alarm and QRB placed and wife present.      Therapy Documentation Precautions:  Precautions Precautions: Fall Precaution Comments: B Nephrostomy tubes Restrictions Weight Bearing Restrictions: No General:   Vital Signs: Therapy Vitals Temp: 97.5 F (36.4 C) Temp Source: Oral Pulse Rate: 70 Resp: 18 BP: (!) 145/58 Patient Position (if appropriate): Sitting Oxygen Therapy SpO2: 98 % O2 Device: Not Delivered   See Function Navigator for Current Functional Status.   Therapy/Group: Individual Therapy  Yarlin Breisch  Thais Silberstein, PTA  05/20/2017, 4:07 PM

## 2017-05-20 NOTE — Progress Notes (Signed)
Occupational Therapy Session Note  Patient Details  Name: William [name] MRN: [id number] Date of Birth: August 24, 1934  Today's Date: 05/20/2017 OT Individual Time: 8466-5993 OT Individual Time Calculation (min): 59 min    Short Term Goals: Week 2:  OT Short Term Goal 1 (Week 2): Continue working on established LTGs set at supervision level overall.    Skilled Therapeutic Interventions/Progress Updates:    Pt completed transfer from the bed to the shower bench with min assist.  Min assist overall for sit to stand to complete bathing from shower level.  He was able to complete dressing sit to stand at the EOB with min assist as well.  He was transferred to the recliner with min assist and use of the RW to conclude session.  Chair alarm and safety belt in place.  He was able to demonstrate appropriate use of the call button.    Therapy Documentation Precautions:  Precautions Precautions: Fall Precaution Comments: B Nephrostomy tubes Restrictions Weight Bearing Restrictions: No  Pain: Pain Assessment Pain Assessment: No/denies pain ADL:  See Function Navigator for Current Functional Status.   Therapy/Group: Individual Therapy  Jahaan Vanwagner OTR/L 05/20/2017, 12:26 PM

## 2017-05-21 ENCOUNTER — Inpatient Hospital Stay (HOSPITAL_COMMUNITY): Payer: Medicare Other | Admitting: Occupational Therapy

## 2017-05-21 LAB — GLUCOSE, CAPILLARY
GLUCOSE-CAPILLARY: 88 mg/dL (ref 65–99)
GLUCOSE-CAPILLARY: 109 mg/dL — AB (ref 65–99)
GLUCOSE-CAPILLARY: 160 mg/dL — AB (ref 65–99)
Glucose-Capillary: 106 mg/dL — ABNORMAL HIGH (ref 65–99)

## 2017-05-21 LAB — BASIC METABOLIC PANEL
ANION GAP: 6 (ref 5–15)
BUN: 15 mg/dL (ref 6–20)
CHLORIDE: 105 mmol/L (ref 101–111)
CO2: 28 mmol/L (ref 22–32)
Calcium: 8.7 mg/dL — ABNORMAL LOW (ref 8.9–10.3)
Creatinine, Ser: 1.18 mg/dL (ref 0.61–1.24)
GFR, EST NON AFRICAN AMERICAN: 56 mL/min — AB (ref >60)
Glucose, Bld: 100 mg/dL — ABNORMAL HIGH (ref 65–99)
POTASSIUM: 3.9 mmol/L (ref 3.5–5.1)
SODIUM: 139 mmol/L (ref 135–145)

## 2017-05-21 NOTE — Progress Notes (Signed)
Occupational Therapy Session Note  Patient Details  Name: William [name] MRN: [id number] Date of Birth: Mar 28, 1934  Today's Date: 05/21/2017 OT Individual Time: 1994-1290 OT Individual Time Calculation (min): 44 min    Short Term Goals: Week 1:  OT Short Term Goal 1 (Week 1): STGs=LTGs 2/2 ELOS Week 2:  OT Short Term Goal 1 (Week 2): Continue working on established LTGs set at supervision level overall.      Skilled Therapeutic Interventions/Progress Updates:    Tx focus on balance and endurance during meaningful leisure occupation.   Pt greeted in recliner with family present. Ready to go, catheter bags strapped to walker. Supervision sit<stand with RW!  Pt ambulated to therapy apartment with Min A. He engaged in guided Tai Chi sitting EOB, then standing with RW and OT providing Min A for safety with dynamic balance. Pt stood for 2 minute windows before requiring seated rest breaks. Pt able to recall [place]ola moves (out of 108) and flow through them himself while sitting unsupported, utilizing diaphragmatic breathing techniques. He was very proud of this (and also very fatigued). Pt then ambulated back to room with RW and Min A. He was left with all needs and family present at time of departure.     Encouraged him/family to explore community chair Tai Chi classes at d/c to promote return to this meaningful occupation   Therapy Documentation Precautions:  Precautions Precautions: Fall Precaution Comments: B Nephrostomy tubes Restrictions Weight Bearing Restrictions: No  Pain: No c/o pain during tx    ADL:      See Function Navigator for Current Functional Status.   Therapy/Group: Individual Therapy  Shawnise Peterkin A Arieon Scalzo 05/21/2017, 3:13 PM

## 2017-05-21 NOTE — Progress Notes (Signed)
[place] PHYSICAL MEDICINE & REHABILITATION     PROGRESS NOTE  Subjective/Complaints:  No issues overnite, slept well Discussed blood work  ROS: Denies CP, SOB, N/V/D.  Objective: Vital Signs: Blood pressure (!) 145/58, pulse 70, temperature 97.5 F (36.4 C), temperature source Oral, resp. rate 18, height 6' 1.5" (1.867 m), weight 90.6 kg (199 lb 12.8 oz), SpO2 98 %. Dg Swallowing Func-speech Pathology  Result Date: 05/19/2017 Objective Swallowing Evaluation: Type of Study: MBS-Modified Barium Swallow Study Patient Details Name: William [name] MRN: [id number] Date of Birth: 1934/03/24 Today's Date: 05/19/2017 Time: 8144-8185 No Data Recorded Past Medical History: Past Medical History: Diagnosis Date . Arthritis  . Diabetes mellitus without complication ([place])  . Hypercholesteremia  . Hypertension  . Kidney stones  . Prostate cancer [facility])   with radiation treatment . Sleep apnea 2/14  "mild per patient" hasnt gotten CPAP machine . Stroke [facility])  Past Surgical History: Past Surgical History: Procedure Laterality Date . ADENOIDECTOMY   . CYSTOSCOPY W/ URETERAL STENT PLACEMENT Bilateral 05/07/2017  Procedure: CYSTOSCOPY WITH BILATERAL RETROGRADE PYELOGRAM CYSTOGRAM BILATERAL URETERAL STENT PLACEMENT;  Surgeon: Festus Aloe, MD;  Location: [place];  Service: Urology;  Laterality: Bilateral; . FLEXIBLE SIGMOIDOSCOPY N/A 09/12/2013  Procedure: FLEXIBLE SIGMOIDOSCOPY;  Surgeon: Milus Banister, MD;  Location: [facility];  Service: Endoscopy;  Laterality: N/A; . HERNIA REPAIR   . HOT HEMOSTASIS N/A 09/12/2013  Procedure: HOT HEMOSTASIS (ARGON PLASMA COAGULATION/BICAP);  Surgeon: Milus Banister, MD;  Location: [facility];  Service: Endoscopy;  Laterality: N/A; . IR NEPHROSTOMY PLACEMENT LEFT  05/09/2017 . IR NEPHROSTOMY PLACEMENT RIGHT  05/09/2017 . LIPOMA EXCISION   . OTHER SURGICAL HISTORY    s/p prostate radiation . TONSILLECTOMY   . TOTAL KNEE ARTHROPLASTY Left 03/25/2014  Procedure: LEFT TOTAL KNEE  ARTHROPLASTY;  Surgeon: [name], MD;  Location: [facility];  Service: Orthopedics;  Laterality: Left; . TOTAL KNEE ARTHROPLASTY Right 05/06/2014  Procedure: RIGHT TOTAL KNEE ARTHROPLASTY;  Surgeon: [name], MD;  Location: [facility];  Service: Orthopedics;  Laterality: Right; No Data Recorded No Data Recorded Assessment / Plan / Recommendation CHL IP CLINICAL IMPRESSIONS 05/19/2017 Clinical Impression Pt presents with moderate sensorimotor pharyngeal dysphagia c/b sensed aspiration of thin liquids d/t delayed swallow initiation and mild residue in vallecula and pyriform sinuses that was aspirated after the swallow. Pt with consistent premature spillage and delayed swallow initiation of thin liquids, nectar thick liquids, honey thick liquids, puree and dysphagia 2 textures. With thin liquids, pt demonstrated frank penetration during swallow and during second swallow of oral residuals. After swallow, residue from vallecula and pyriform sinuses spilled anteriorally and posteriorally into airway with aspiration of these residuals. Pt with delayed cough that was ineffective in clearing aspirated material. With spoon boluses of nectar thick liquids, pt with better airway protection evidenced by swallow initiation at vallecula and the ability to contain residue safety within vallecular space. With large cup sips of nectar thick liquids, pt with frank penetration to cords and no evidence of clearing penetrates. Pt with right sided weakness from CVA in 09/2016 and therefore head turn to right was attempted with trials of nectar thick liquids by cup. However, this strategy didn't prevent frank penetration. Therefore, I recommend pt consume nectar thick liquids by spoon with dysphagia 3 diet, medicine whole in puree and implement water protocol in between meals given sensed aspiration, risk of dehydration and no immediate history of pneumonia. Given family's report that pt's coughing began with CVA in 09/2016, pt's  prognosis is guarded.  SLP Visit Diagnosis Dysphagia, oropharyngeal phase (R13.12) Attention and concentration deficit following -- Frontal lobe and executive function deficit following -- Impact on safety and function Moderate aspiration risk   No flowsheet data found.  No flowsheet data found. CHL IP DIET RECOMMENDATION 05/19/2017 SLP Diet Recommendations Dysphagia 3 (Mech soft) solids;Nectar thick liquid;No mixed consistencies Liquid Administration via Spoon Medication Administration Whole meds with puree Compensations Minimize environmental distractions;Slow rate;Small sips/bites Postural Changes Seated upright at 90 degrees   CHL IP OTHER RECOMMENDATIONS 05/19/2017 Recommended Consults Consider GI evaluation Oral Care Recommendations Oral care BID;Staff/trained caregiver to provide oral care Other Recommendations Order thickener from pharmacy;Prohibited food (jello, ice cream, thin soups);Remove water pitcher;Have oral suction available   CHL IP FOLLOW UP RECOMMENDATIONS 10/27/2016 Follow up Recommendations None   No flowsheet data found.     CHL IP ORAL PHASE 05/19/2017 Oral Phase -- Oral - Pudding Teaspoon -- Oral - Pudding Cup -- Oral - Honey Teaspoon -- Oral - Honey Cup -- Oral - Nectar Teaspoon -- Oral - Nectar Cup -- Oral - Nectar Straw -- Oral - Thin Teaspoon Incomplete tongue to palate contact;Premature spillage Oral - Thin Cup Incomplete tongue to palate contact;Premature spillage Oral - Thin Straw Incomplete tongue to palate contact;Premature spillage Oral - Puree Premature spillage;Incomplete tongue to palate contact Oral - Mech Soft Premature spillage;Incomplete tongue to palate contact Oral - Regular -- Oral - Multi-Consistency -- Oral - Pill Premature spillage;Incomplete tongue to palate contact Oral Phase - Comment --  CHL IP PHARYNGEAL PHASE 05/19/2017 Pharyngeal Phase -- Pharyngeal- Pudding Teaspoon -- Pharyngeal -- Pharyngeal- Pudding Cup -- Pharyngeal -- Pharyngeal- Honey Teaspoon -- Pharyngeal  -- Pharyngeal- Honey Cup -- Pharyngeal -- Pharyngeal- Nectar Teaspoon -- Pharyngeal -- Pharyngeal- Nectar Cup Penetration/Apiration after swallow Pharyngeal Material enters airway, remains ABOVE vocal cords and not ejected out Pharyngeal- Nectar Straw -- Pharyngeal -- Pharyngeal- Thin Teaspoon Delayed swallow initiation-vallecula;Reduced tongue base retraction;Penetration/Aspiration during swallow;Penetration/Apiration after swallow;Trace aspiration;Pharyngeal residue - valleculae;Pharyngeal residue - pyriform;Pharyngeal residue - cp segment Pharyngeal Material enters airway, passes BELOW cords and not ejected out despite cough attempt by patient Pharyngeal- Thin Cup Delayed swallow initiation-vallecula;Reduced tongue base retraction;Penetration/Aspiration during swallow;Penetration/Apiration after swallow;Trace aspiration;Pharyngeal residue - valleculae;Pharyngeal residue - pyriform;Pharyngeal residue - cp segment Pharyngeal Material enters airway, passes BELOW cords and not ejected out despite cough attempt by patient Pharyngeal- Thin Straw Delayed swallow initiation-vallecula;Reduced tongue base retraction;Penetration/Aspiration during swallow;Penetration/Apiration after swallow;Trace aspiration;Pharyngeal residue - cp segment;Pharyngeal residue - pyriform;Pharyngeal residue - valleculae Pharyngeal Material enters airway, passes BELOW cords and not ejected out despite cough attempt by patient Pharyngeal- Puree Delayed swallow initiation-vallecula;Reduced tongue base retraction;Pharyngeal residue - valleculae Pharyngeal -- Pharyngeal- Mechanical Soft Delayed swallow initiation-vallecula;Reduced tongue base retraction;Pharyngeal residue - valleculae Pharyngeal -- Pharyngeal- Regular -- Pharyngeal -- Pharyngeal- Multi-consistency -- Pharyngeal -- Pharyngeal- Pill Delayed swallow initiation-vallecula;Penetration/Aspiration during swallow;Penetration/Apiration after swallow;Trace aspiration;Pharyngeal residue -  valleculae;Pharyngeal residue - pyriform;Pharyngeal residue - cp segment Pharyngeal Material enters airway, passes BELOW cords and not ejected out despite cough attempt by patient Pharyngeal Comment Compensatory strategies, head turn and cough were ineffective  CHL IP CERVICAL ESOPHAGEAL PHASE 05/19/2017 Cervical Esophageal Phase Impaired Pudding Teaspoon -- Pudding Cup -- Honey Teaspoon -- Honey Cup -- Nectar Teaspoon -- Nectar Cup -- Nectar Straw -- Thin Teaspoon -- Thin Cup -- Thin Straw -- Puree -- Mechanical Soft -- Regular -- Multi-consistency -- Pill -- Cervical Esophageal Comment -- CHL IP GO 10/27/2016 Functional Assessment Tool Used clinician judgement Functional Limitations Motor speech Swallow Current  Status [phone]) (None) Swallow Goal Status (N4709) (None) Swallow Discharge Status [phone]) (None) Motor Speech Current Status [phone]) [place] Motor Speech Goal Status (M5465) [place] Motor Speech Goal Status [phone]) [place] Spoken Language Comprehension Current Status [phone]) (None) Spoken Language Comprehension Goal Status (X5170) (None) Spoken Language Comprehension Discharge Status [phone]) (None) Spoken Language Expression Current Status [phone]) (None) Spoken Language Expression Goal Status [phone]) (None) Spoken Language Expression Discharge Status [phone]) (None) Attention Current Status (L9357) (None) Attention Goal Status (S1779) (None) Attention Discharge Status [phone]) (None) Memory Current Status (S9233) (None) Memory Goal Status (A0762) (None) Memory Discharge Status (U6333) (None) Voice Current Status (L4562) (None) Voice Goal Status (B6389) (None) Voice Discharge Status [phone]) (None) Other Speech-Language Pathology Functional Limitation Current Status [phone]) (None) Other Speech-Language Pathology Functional Limitation Goal Status (L5726) (None) Other Speech-Language Pathology Functional Limitation Discharge Status [phone]) (None) Happi Overton 05/19/2017, 11:41 AM               Recent Labs  05/19/17 0419   WBC 13.7*  HGB 11.4*  HCT 34.6*  PLT 361    Recent Labs  05/20/17 0422 05/21/17 0438  NA 139 139  K 3.8 3.9  CL 104 105  GLUCOSE 117* 100*  BUN 17 15  CREATININE 1.21 1.18  CALCIUM 8.5* 8.7*   CBG (last 3)   Recent Labs  05/20/17 1155 05/20/17 1635 05/20/17 2111  GLUCAP 140* 132* 129*    Wt Readings from Last 3 Encounters:  05/19/17 90.6 kg (199 lb 12.8 oz)  05/12/17 92.5 kg (204 lb)  01/05/17 104.3 kg (230 lb)    Physical Exam:  BP (!) 145/58 (BP Location: Left Arm)   Pulse 70   Temp 97.5 F (36.4 C) (Oral)   Resp 18   Ht 6' 1.5" (1.867 m)   Wt 90.6 kg (199 lb 12.8 oz)   SpO2 98%   BMI 26.00 kg/m  Constitutional: He appears well-developed. Vitalsreviewed. NAD. HENT: Normocephalic. Atraumatic Eyes: EOMare normal. No discharge.  Cardiovascular: IRIR Respiratory: Effort normal and breath sounds normal.  GI: Soft. Bowel sounds are normal.  Neurological: He is alert.  Delay in processing.  HOH Motor: Grossly 5/5 b/l UE B/l LE 4+/5 RLE, 4+/5 LLE  Skin. Warm and dry. Intact.  Assessment/Plan: 1. Functional deficits secondary to bilateral hydronephrosis status post stenting 05/09/2017 as well as bilateral nephrostomy tubes which require 3+ hours per day of interdisciplinary therapy in a comprehensive inpatient rehab setting. Physiatrist is providing close team supervision and 24 hour management of active medical problems listed below. Physiatrist and rehab team continue to assess barriers to discharge/monitor patient progress toward functional and medical goals.  Function:  Bathing Bathing position   Position: Shower  Bathing parts Body parts bathed by patient: Right arm, Left arm, Chest, Abdomen, Right upper leg, Left upper leg, Right lower leg, Left lower leg, Front perineal area, Buttocks Body parts bathed by helper: Back  Bathing assist Assist Level: Touching or steadying assistance(Pt > 75%)      Upper Body Dressing/Undressing Upper body  dressing   What is the patient wearing?: Pull over shirt/dress     Pull over shirt/dress - Perfomed by patient: Thread/unthread left sleeve, Put head through opening, Pull shirt over trunk, Thread/unthread right sleeve          Upper body assist Assist Level: Supervision or verbal cues      Lower Body Dressing/Undressing Lower body dressing   What is the patient wearing?: Pants, Underwear, Non-skid slipper socks, Socks, Shoes Underwear -  Performed by patient: Thread/unthread right underwear leg, Thread/unthread left underwear leg, Pull underwear up/down   Pants- Performed by patient: Thread/unthread right pants leg, Thread/unthread left pants leg, Pull pants up/down   Non-skid slipper socks- Performed by patient: Don/doff right sock Non-skid slipper socks- Performed by helper: Don/doff right sock, Don/doff left sock Socks - Performed by patient: Don/doff right sock, Don/doff left sock   Shoes - Performed by patient: Don/doff right shoe, Don/doff left shoe, Fasten right, Fasten left            Lower body assist Assist for lower body dressing: Touching or steadying assistance (Pt > 75%)      Toileting Toileting Toileting activity did not occur: No continent bowel/bladder event Toileting steps completed by patient: Adjust clothing prior to toileting, Performs perineal hygiene, Adjust clothing after toileting Toileting steps completed by helper: Adjust clothing prior to toileting Toileting Assistive Devices: Grab bar or rail, Toilet aid  Toileting assist Assist level: Touching or steadying assistance (Pt.75%)   Transfers Chair/bed transfer   Chair/bed transfer method: Ambulatory Chair/bed transfer assist level: Touching or steadying assistance (Pt > 75%) Chair/bed transfer assistive device: Medical sales representative     Max distance: 112ft  Assist level: Touching or steadying assistance (Pt > 75%)   Wheelchair   Type: Manual Max wheelchair distance: 100  ft Assist Level: Supervision or verbal cues  Cognition Comprehension Comprehension assist level: Follows complex conversation/direction with extra time/assistive device  Expression Expression assist level: Set-up assist with assistive device  Social Interaction Social Interaction assist level: Interacts appropriately with others - No medications needed.  Problem Solving Problem solving assist level: Solves complex problems: With extra time  Memory Memory assist level: Recognizes or recalls 75 - 89% of the time/requires cueing 10 - 24% of the time    Medical Problem List and Plan: 1. Debilitation secondary to acute renal failure with bilateral hydronephrosis status post stenting 05/09/2017 as well as bilateral nephrostomy tubes 05/10/2017  Cont CIR  Will follow up with nephro regarding tubes next week 2. DVT Prophylaxis/Anticoagulation:Chronic Eliquis 3. Pain Management: Tylenol 4. Mood/delirium: Trazodone 50 mg daily at bedtime, Ativan 1 mg as needed anxiety/sleep 5. Neuropsych: This patient iscapable of making decisions on hisown behalf. 6. Skin/Wound Care: Routine skin checks 7. Fluids/Electrolytes/Nutrition: Routine I&Os  Diet liberalized per daughter request 8.AKI/hypernatremiafelt to be secondary to obstruction.BMET normalized will d/c daily BMET   Cont to monitor 9.Hypertension. Norvasc 5 mg daily at bedtime  Controlled 6/24 Vitals:   05/20/17 0439 05/20/17 1407  BP: (!) 116/59 (!) 145/58  Pulse: 71 70  Resp: 18 18  Temp: 97.8 F (36.6 C) 97.5 F (36.4 C)   10.Atrial fibrillation.Eliquisresumed. Cardiac rate control 11.Coag-negative staph UTI.   Blood cultures NG.   Repeat urine culture NG.   IV rocephin- will continue for now and consider d/cing next week 12.Type 2 diabetes mellitus. Hemoglobin A1c 6.8. Sliding scale insulin.   Levemir 6 units started on 6/19 (home 16 units)  Controlled 6/24 CBG (last 3)   Recent Labs  05/20/17 1155 05/20/17 1635  05/20/17 2111  GLUCAP 140* 132* 129*    13.History of prostate cancer and radiation therapy 14.Hyperlipidemia. Pravachol 15. Hypoalbuminemia  Supplement initiated  16. Leukocytosis  WBCs 13.7 on 6/22- discussed trend with daughter  Improving  CBC ordered for Monday  Afrebrile  Cont to monitor  17. ABLA  Hb 11.4 on 6/22  Cont to monitor  CBC ordered for Monday    LOS (  Days) 8 A FACE TO FACE EVALUATION WAS PERFORMED  Yanissa Michalsky E 05/21/2017 6:18 AM

## 2017-05-22 ENCOUNTER — Inpatient Hospital Stay (HOSPITAL_COMMUNITY): Payer: Medicare Other | Admitting: Speech Pathology

## 2017-05-22 ENCOUNTER — Inpatient Hospital Stay (HOSPITAL_COMMUNITY): Payer: Medicare Other | Admitting: Physical Therapy

## 2017-05-22 ENCOUNTER — Inpatient Hospital Stay (HOSPITAL_COMMUNITY): Payer: Medicare Other | Admitting: Occupational Therapy

## 2017-05-22 LAB — CBC WITH DIFFERENTIAL/PLATELET
BASOS ABS: 0 10*3/uL (ref 0.0–0.1)
Basophils Relative: 0 %
EOS PCT: 2 %
Eosinophils Absolute: 0.2 10*3/uL (ref 0.0–0.7)
HCT: 34.5 % — ABNORMAL LOW (ref 39.0–52.0)
Hemoglobin: 11.1 g/dL — ABNORMAL LOW (ref 13.0–17.0)
LYMPHS ABS: 1.5 10*3/uL (ref 0.7–4.0)
Lymphocytes Relative: 16 %
MCH: 30.5 pg (ref 26.0–34.0)
MCHC: 32.2 g/dL (ref 30.0–36.0)
MCV: 94.8 fL (ref 78.0–100.0)
MONO ABS: 0.6 10*3/uL (ref 0.1–1.0)
Monocytes Relative: 7 %
Neutro Abs: 6.9 10*3/uL (ref 1.7–7.7)
Neutrophils Relative %: 75 %
PLATELETS: 361 10*3/uL (ref 150–400)
RBC: 3.64 MIL/uL — ABNORMAL LOW (ref 4.22–5.81)
RDW: 15.4 % (ref 11.5–15.5)
WBC: 9.3 10*3/uL (ref 4.0–10.5)

## 2017-05-22 LAB — GLUCOSE, CAPILLARY
Glucose-Capillary: 120 mg/dL — ABNORMAL HIGH (ref 65–99)
Glucose-Capillary: 144 mg/dL — ABNORMAL HIGH (ref 65–99)
Glucose-Capillary: 130 mg/dL — ABNORMAL HIGH (ref 65–99)
Glucose-Capillary: 149 mg/dL — ABNORMAL HIGH (ref 65–99)

## 2017-05-22 LAB — BASIC METABOLIC PANEL
Anion gap: 6 (ref 5–15)
BUN: 15 mg/dL (ref 6–20)
CO2: 27 mmol/L (ref 22–32)
CREATININE: 1.23 mg/dL (ref 0.61–1.24)
Calcium: 8.8 mg/dL — ABNORMAL LOW (ref 8.9–10.3)
Chloride: 106 mmol/L (ref 101–111)
GFR calc Af Amer: >60 mL/min (ref >60)
GFR, EST NON AFRICAN AMERICAN: 54 mL/min — AB (ref >60)
GLUCOSE: 122 mg/dL — AB (ref 65–99)
Potassium: 4.1 mmol/L (ref 3.5–5.1)
Sodium: 139 mmol/L (ref 135–145)

## 2017-05-22 NOTE — Plan of Care (Signed)
Problem: RH BOWEL ELIMINATION Goal: RH STG MANAGE BOWEL WITH ASSISTANCE STG Manage Bowel with mod I   Outcome: Progressing Pt is min assist

## 2017-05-22 NOTE — Progress Notes (Signed)
Patient's family states that he may not wear CPAP tonight. RT informed family and RN to give RT a call if he does decide to wear it. RT will continue to monitor as needed.

## 2017-05-22 NOTE — Progress Notes (Signed)
Occupational Therapy Session Note  Patient Details  Name: William [name] MRN: [id number] Date of Birth: 12/18/1933  Today's Date: 05/22/2017 OT Individual Time: 1101-1158 OT Individual Time Calculation (min): 57 min    Short Term Goals: Week 2:  OT Short Term Goal 1 (Week 2): Continue working on established LTGs set at supervision level overall.    Skilled Therapeutic Interventions/Progress Updates:    Pt ambulated to the ADL apartment with min assist to start session, after donning shoes with setup.  Pt's wife accompanied him to the ADL apartment as well.  Practiced simulated walk-in shower transfers and bed transfers to regular bed.  Min assist for simulated shower transfer with use of the RW and posterior/anterior method.  Supervision for bed transfer with mod instructional cueing for supine to sit.  Educated pt on rolling to the side to sit up instead of trying to sit up from flat supine position.  He also completed toilet transfer with min assist to elevated toilet.  Returned to room at end of session using the RW.  Oxygen sats checked as well at 94% on room air with mobility.  Pt left in bedside chair without chair alarm or belt as spouse is present.  He will need a 3:1 for use at home over the toilet, at bedside, and possibly the shower.    Therapy Documentation Precautions:  Precautions Precautions: Fall Precaution Comments: B Nephrostomy tubes Restrictions Weight Bearing Restrictions: No  Pain: Pain Assessment Pain Assessment: No/denies pain ADL: See Function Navigator for Current Functional Status.   Therapy/Group: Individual Therapy  Abdulrahim Siddiqi OTR/L 05/22/2017, 12:22 PM

## 2017-05-22 NOTE — Progress Notes (Addendum)
[place] PHYSICAL MEDICINE & REHABILITATION     PROGRESS NOTE  Subjective/Complaints:  Pt seen laying in bed this AM.  He slept well overnight.  Daughter at bedisde.  She has questions about tubes, urology, WBCs, Cr.  ROS: Denies CP, SOB, N/V/D.  Objective: Vital Signs: Blood pressure 140/78, pulse 93, temperature 98.4 F (36.9 C), temperature source Oral, resp. rate 17, height 6' 1.5" (1.867 m), weight 90.8 kg (200 lb 2.8 oz), SpO2 98 %. No results found.  Recent Labs  05/22/17 0450  WBC 9.3  HGB 11.1*  HCT 34.5*  PLT 361    Recent Labs  05/21/17 0438 05/22/17 0450  NA 139 139  K 3.9 4.1  CL 105 106  GLUCOSE 100* 122*  BUN 15 15  CREATININE 1.18 1.23  CALCIUM 8.7* 8.8*   CBG (last 3)   Recent Labs  05/21/17 1636 05/21/17 2144 05/22/17 0647  GLUCAP 109* 160* 120*    Wt Readings from Last 3 Encounters:  05/22/17 90.8 kg (200 lb 2.8 oz)  05/12/17 92.5 kg (204 lb)  01/05/17 104.3 kg (230 lb)    Physical Exam:  BP 140/78 (BP Location: Right Arm)   Pulse 93   Temp 98.4 F (36.9 C) (Oral)   Resp 17   Ht 6' 1.5" (1.867 m)   Wt 90.8 kg (200 lb 2.8 oz)   SpO2 98%   BMI 26.05 kg/m  Constitutional: He appears well-developed. Vitalsreviewed. NAD. HENT: Normocephalic. Atraumatic Eyes: EOMare normal. No discharge.  Cardiovascular: IRIR Respiratory: Effort normal and breath sounds normal.  GI: Soft. Bowel sounds are normal.  Neurological: He is alert.  Delay in processing.  HOH Motor: Grossly 5/5 b/l UE B/l LE 4+/5 RLE, 4+/5 LLE  (stable) Skin. Warm and dry. Intact.  Assessment/Plan: 1. Functional deficits secondary to bilateral hydronephrosis status post stenting 05/09/2017 as well as bilateral nephrostomy tubes which require 3+ hours per day of interdisciplinary therapy in a comprehensive inpatient rehab setting. Physiatrist is providing close team supervision and 24 hour management of active medical problems listed below. Physiatrist and rehab  team continue to assess barriers to discharge/monitor patient progress toward functional and medical goals.  Function:  Bathing Bathing position   Position: Shower  Bathing parts Body parts bathed by patient: Right arm, Left arm, Chest, Abdomen, Right upper leg, Left upper leg, Right lower leg, Left lower leg, Front perineal area, Buttocks Body parts bathed by helper: Back  Bathing assist Assist Level: Touching or steadying assistance(Pt > 75%)      Upper Body Dressing/Undressing Upper body dressing   What is the patient wearing?: Pull over shirt/dress     Pull over shirt/dress - Perfomed by patient: Thread/unthread left sleeve, Put head through opening, Pull shirt over trunk, Thread/unthread right sleeve          Upper body assist Assist Level: Supervision or verbal cues      Lower Body Dressing/Undressing Lower body dressing   What is the patient wearing?: Pants, Underwear, Non-skid slipper socks, Socks, Shoes Underwear - Performed by patient: Thread/unthread right underwear leg, Thread/unthread left underwear leg, Pull underwear up/down   Pants- Performed by patient: Thread/unthread right pants leg, Thread/unthread left pants leg, Pull pants up/down   Non-skid slipper socks- Performed by patient: Don/doff right sock Non-skid slipper socks- Performed by helper: Don/doff right sock, Don/doff left sock Socks - Performed by patient: Don/doff right sock, Don/doff left sock   Shoes - Performed by patient: Don/doff right shoe, Don/doff left shoe, Fasten right,  Fasten left            Lower body assist Assist for lower body dressing: Touching or steadying assistance (Pt > 75%)      Toileting Toileting Toileting activity did not occur: No continent bowel/bladder event Toileting steps completed by patient: Adjust clothing prior to toileting, Performs perineal hygiene, Adjust clothing after toileting Toileting steps completed by helper: Adjust clothing prior to  toileting Toileting Assistive Devices: Grab bar or rail, Toilet aid  Toileting assist Assist level: Touching or steadying assistance (Pt.75%)   Transfers Chair/bed transfer   Chair/bed transfer method: Ambulatory Chair/bed transfer assist level: Touching or steadying assistance (Pt > 75%) Chair/bed transfer assistive device: Armrests, Medical sales representative     Max distance: 122ft  Assist level: Touching or steadying assistance (Pt > 75%)   Wheelchair   Type: Manual Max wheelchair distance: 100 ft Assist Level: Supervision or verbal cues  Cognition Comprehension Comprehension assist level: Follows complex conversation/direction with extra time/assistive device  Expression Expression assist level: Set-up assist with assistive device  Social Interaction Social Interaction assist level: Interacts appropriately with others - No medications needed.  Problem Solving Problem solving assist level: Solves complex problems: With extra time  Memory Memory assist level: Recognizes or recalls 75 - 89% of the time/requires cueing 10 - 24% of the time    Medical Problem List and Plan: 1. Debilitation secondary to acute renal failure with bilateral hydronephrosis status post stenting 05/09/2017 as well as bilateral nephrostomy tubes 05/10/2017  Cont CIR  Will follow up with Urology regarding tubes   Discussed with weekend MD 2. DVT Prophylaxis/Anticoagulation:Chronic Eliquis 3. Pain Management: Tylenol 4. Mood/delirium: Trazodone 50 mg daily at bedtime, Ativan 1 mg as needed anxiety/sleep 5. Neuropsych: This patient iscapable of making decisions on hisown behalf. 6. Skin/Wound Care: Routine skin checks 7. Fluids/Electrolytes/Nutrition: Routine I&Os  Diet liberalized per daughter request 8.AKI/hypernatremiafelt to be secondary to obstruction.  Cont to monitor  Appears to have stabilized, Cr 1.23 on 6/25 9.Hypertension. Norvasc 5 mg daily at bedtime  Relatively controlled  6/25 Vitals:   05/21/17 2100 05/22/17 0448  BP: 133/78 140/78  Pulse: 69 93  Resp:  17  Temp:  98.4 F (36.9 C)   10.Atrial fibrillation.Eliquisresumed. Cardiac rate control 11.Coag-negative staph UTI.   Blood cultures NG.   Repeat urine culture NG.   IV rocephin d/ced 12.Type 2 diabetes mellitus. Hemoglobin A1c 6.8. Sliding scale insulin.   Levemir 6 units started on 6/19 (home 16 units)  Relatively controlled 6/25 CBG (last 3)   Recent Labs  05/21/17 1636 05/21/17 2144 05/22/17 0647  GLUCAP 109* 160* 120*   13.History of prostate cancer and radiation therapy 14.Hyperlipidemia. Pravachol 15. Hypoalbuminemia  Supplement initiated  16. Leukocytosis: Resolved  WBCs 9.3 on 6/25  Improving  Afrebrile  Cont to monitor 17. ABLA  Hb 11.1 on 6/25  Cont to monitor  LOS (Days) 9 A FACE TO FACE EVALUATION WAS PERFORMED  Ankit Lorie Phenix 05/22/2017 8:44 AM

## 2017-05-22 NOTE — Progress Notes (Signed)
Physical Therapy Session Note  Patient Details  Name: William [name] MRN: [id number] Date of Birth: 07-Nov-1934  Today's Date: 05/22/2017 PT Individual Time: 1515-1600 PT Individual Time Calculation (min): 45 min   Short Term Goals: Week 1:  PT Short Term Goal 1 (Week 1): Pt will increasebed mobility to S.  PT Short Term Goal 2 (Week 1): Pt will increase transfers bed to chair to S.  PT Short Term Goal 3 (Week 1): Pt will ambulate with rolling walker and S about 150 feet. PT Short Term Goal 4 (Week 1): Pt will ascend/descend 2 step x 2 with rolling walker.   Skilled Therapeutic Interventions/Progress Updates:    no c/o of pain. PT treatment focused on LE strengthening and endurance.  Pt performed supine to sit transfer with no assistance but required more than an appropriate amount of time. Pt performed sit to stands using a rolling walker with supervision requiring increased time to come to standing.  Pt ambulated 13ft with a rolling walker with supervision. Pt performed 9x sit to stands for LE strengthening and endurance working towards no UE assistance to come to standing. Pt reported increased fatigue and the pt's wife reported that approximately an hour prior to the PT session the pt had stated feeling "weak all over." Pt performed 2x stand pivot transfer without an AD requiring supervising. Pt left in recliner with call bell in place.   Therapy Documentation Precautions:  Precautions Precautions: Fall Precaution Comments: B Nephrostomy tubes Restrictions Weight Bearing Restrictions: No  See Function Navigator for Current Functional Status.   Therapy/Group: Individual Therapy  Demarri Elie 05/22/2017, 4:39 PM

## 2017-05-22 NOTE — Progress Notes (Addendum)
Nutrition Follow Up Note  DOCUMENTATION CODES:   Severe malnutrition in context of chronic illness  INTERVENTION:   Glucerna Shake po TID, each supplement provides 220 kcal and 10 grams of protein  Magic cup TID with meals, each supplement provides 290 kcal and 9 grams of protein  MVI  Liberalize diet  NUTRITION DIAGNOSIS:   Malnutrition (severe) related to chronic illness, poor appetite (renal disease and DM) as evidenced by 12 percent weight loss in 4 months, severe depletion of muscle mass, severe depletion of body fat.  GOAL:   Patient will meet greater than or equal to 90% of their needs  MONITOR:   PO intake, Supplement acceptance, Labs, Weight trends, I & O's  ASSESSMENT:   83 y.o. male who is admitted for CIR with debility secondary to acute renal failure.  He is status post stenting on 05/09/2017 for bilateral hydronephrosis; he is status post bilateral nephrostomy tubes placed 05/10/2017.     Spoke to RN today. Pt eating 50% of meals and drinking 1-2 Glucerna per day. Pt complaining about hospital food and reports his tray is never right. Pt had MBS on 6/22 and approved for DYS 3/NCT liquids. Pt liberalized to regular diet today per family request. RN feels that pt's inadequate oral intake is more from the pt's dislike of the food rather than poor appetite. RN reports that if she will stand there and talk to pt while he drinks that he will drink the entire supplement. RN also reports that she has encouraged family to bring food from home that pt will eat. Per chart, pt is weight stable since admit. Please encourage intake of meals and supplements.   Medications reviewed and include: insulin, MVI  Labs reviewed: K 4.1 wnl, Ca 8.8(L) cbgs- 117, 100, 122 x 48hrs  Diet Order:  DIET DYS 3 Room service appropriate? Yes; Fluid consistency: Nectar Thick  Skin:  Wound (see comment) (perineum incision )  Last BM:  05/21/17  Height:   Ht Readings from Last 1 Encounters:   05/13/17 6' 1.5" (1.867 m)    Weight:   Wt Readings from Last 1 Encounters:  05/22/17 200 lb 2.8 oz (90.8 kg)    Ideal Body Weight:  85 kg  BMI:  Body mass index is 26.05 kg/m.  Estimated Nutritional Needs:   Kcal:  2300-2600kcal/day   Protein:  100-118g/day   Fluid:  >2.3L/day   EDUCATION NEEDS:   Education needs addressed  Koleen Distance MS, RD, LDN Pager #[phone]

## 2017-05-22 NOTE — Progress Notes (Signed)
Speech Language Pathology Daily Session Note  Patient Details  Name: William [name] MRN: [id number] Date of Birth: 10-Jun-1934  Today's Date: 05/22/2017 SLP Individual Time: 7858-8502 SLP Individual Time Calculation (min): 30 min  Short Term Goals: Week 1: SLP Short Term Goal 1 (Week 1): Pt will consume current diet without overt s/s of aspiration and Mod I cues for use of compensatory swallowing strategies.  SLP Short Term Goal 2 (Week 1): Pt will utilize external memory aids to recall new, daily information with Mod A cues.  SLP Short Term Goal 3 (Week 1): Pt will consistently demosntrate O x 4 with Min A cues for use of external aids. SLP Short Term Goal 4 (Week 1): Pt will complete basic familiar tasks iwth Mod A verbal cues for funcitonal problem solving.  SLP Short Term Goal 5 (Week 1): Pt will identify 1 phsyical and 1 cognitive deficit that is related to acute illness with Mod A cues.   Skilled Therapeutic Interventions: Skilled treatment session focused on self-care education regarding current diet and nectar thick liquids. Pt demonstrated functional ability to consume regular diet textures MBS therefore pt's diet upgrade to regular to decrease difficulty in providing meals at home (wife very anxious about dysphagia 3 diet). Extensive education provided on draining thin liquids from soup and all questions answered to satisfaction at this time. Will continue to provide education as needed. Continue per current plan of care.       Function:    Cognition Comprehension Comprehension assist level: Follows basic conversation/direction with no assist  Expression   Expression assist level: Expresses basic 90% of the time/requires cueing < 10% of the time.  Social Interaction Social Interaction assist level: Interacts appropriately with others - No medications needed.  Problem Solving Problem solving assist level: Solves basic 75 - 89% of the time/requires cueing 10 - 24% of the time   Memory Memory assist level: Recognizes or recalls 75 - 89% of the time/requires cueing 10 - 24% of the time    Pain Pain Assessment Pain Assessment: No/denies pain  Therapy/Group: Individual Therapy  William [name] 05/22/2017, 3:18 PM

## 2017-05-22 NOTE — Progress Notes (Signed)
Occupational Therapy Session Note  Patient Details  Name: AMBROSIO REUTER MRN: [id number] Date of Birth: 1934-06-27  Today's Date: 05/22/2017 OT Individual Time: 0906-1005 OT Individual Time Calculation (min): 59 min    Short Term Goals: Week 2:  OT Short Term Goal 1 (Week 2): Continue working on established LTGs set at supervision level overall.    Skilled Therapeutic Interventions/Progress Updates:    Tx focus on balance, functional ambulation with device, and endurance during self care completion.   Pt greeted supine in bed, agreeable to shower. Gauze sites and IV site covered. Pt ambulated to bathroom with Min A and RW, transferring to toilet first to void bowels.  Pt then ambulated to shower bench, bathing with overall steady assist for pericare. Afterwards pt dressed with steady assist at EOB, including strapping bilateral catheter bags to belt after he fastened it. He required cues for safe hand placement during all sit<stand transitions. Pt transferred to recliner afterwards. He was repositioned for comfort and left with all needs within reach and spouse present.   Therapy Documentation Precautions:  Precautions Precautions: Fall Precaution Comments: B Nephrostomy tubes Restrictions Weight Bearing Restrictions: No   Pain: No c/o pain during tx  Pain Assessment Pain Assessment: No/denies pain ADL:      See Function Navigator for Current Functional Status.   Therapy/Group: Individual Therapy  Hiroko Tregre A Hilarie Sinha 05/22/2017, 12:30 PM

## 2017-05-23 ENCOUNTER — Inpatient Hospital Stay (HOSPITAL_COMMUNITY): Payer: Medicare Other | Admitting: Occupational Therapy

## 2017-05-23 ENCOUNTER — Inpatient Hospital Stay (HOSPITAL_COMMUNITY): Payer: Medicare Other | Admitting: Physical Therapy

## 2017-05-23 ENCOUNTER — Inpatient Hospital Stay (HOSPITAL_COMMUNITY): Payer: Medicare Other | Admitting: Speech Pathology

## 2017-05-23 DIAGNOSIS — G479 Sleep disorder, unspecified: Secondary | ICD-10-CM

## 2017-05-23 LAB — GLUCOSE, CAPILLARY
GLUCOSE-CAPILLARY: 131 mg/dL — AB (ref 65–99)
GLUCOSE-CAPILLARY: 129 mg/dL — AB (ref 65–99)
Glucose-Capillary: 97 mg/dL (ref 65–99)
Glucose-Capillary: 132 mg/dL — ABNORMAL HIGH (ref 65–99)

## 2017-05-23 MED ORDER — QUETIAPINE FUMARATE 25 MG PO TABS
12.5000 mg | ORAL_TABLET | Freq: Every day | ORAL | Status: DC
  Administered 2017-05-23: 12.5 mg via ORAL
  Filled 2017-05-23: qty 1

## 2017-05-23 NOTE — Progress Notes (Signed)
Occupational Therapy Session Note  Patient Details  Name: William [name] MRN: [id number] Date of Birth: March 26, 1934  Today's Date: 05/23/2017 OT Individual Time: 1500-1600 OT Individual Time Calculation (min): 60 min    Short Term Goals: Week 2:  OT Short Term Goal 1 (Week 2): Continue working on established LTGs set at supervision level overall.    Skilled Therapeutic Interventions/Progress Updates:    Pt seen for OT session focusing on functional transfers, ambulation and activity tolerance. Pt sitting up in recliner upon arrival with wife present, agreeable to tx session and voicing need for toileting task. He ambulated throughout room with RW and close supervision, completed toileting task with overall supervision, assist to steady RW during sit <> stand and clothing management. BP assessed following return to recliner, 106/93 though not symptomatic. RN made aware and gave clearance to cont with therapy as long as pt remained asymptomatic, which he did throughout session.  Pt ambulated from room to therapy room, requiring 3 seated rest breaks during journey, tolerating >50-129ft each trial before requireing seated rest break. Pt able to complete sit <> stands throughout session from bed, standard chair, and low soft surface couch with close supervision, assist to steady RW and occasional VCs for effective technique to come into standing.  During rest breaks, discussed/ educated with pt and wife regarding energy conservation, prioritizing tasks, and DME specifically regular w/c vs. Transport chair. Pt and caregiver very appreciative of discussion as brought up points they had not yet thought of (i.e. Walking within the grocery store, etc). Pt ambulated ~75 ft back towards room before requesting w/c. Propelled w/c using B UEs/LEs. He was left seated in recliner at end of session, all needs in reach.   Therapy Documentation Precautions:  Precautions Precautions: Fall Precaution Comments: B  Nephrostomy tubes Restrictions Weight Bearing Restrictions: No Pain:   No/ denies pain  See Function Navigator for Current Functional Status.   Therapy/Group: Individual Therapy  Lewis, Jairon Ripberger C 05/23/2017, 7:15 AM

## 2017-05-23 NOTE — Progress Notes (Signed)
Speech Language Pathology Daily Session Note  Patient Details  Name: William [name] MRN: [id number] Date of Birth: 1934-08-12  Today's Date: 05/23/2017 SLP Individual Time: 1030-1100 SLP Individual Time Calculation (min): 30 min  Short Term Goals: Week 1: SLP Short Term Goal 1 (Week 1): Pt will consume current diet without overt s/s of aspiration and Mod I cues for use of compensatory swallowing strategies.  SLP Short Term Goal 2 (Week 1): Pt will utilize external memory aids to recall new, daily information with Mod A cues.  SLP Short Term Goal 3 (Week 1): Pt will consistently demosntrate O x 4 with Min A cues for use of external aids. SLP Short Term Goal 4 (Week 1): Pt will complete basic familiar tasks iwth Mod A verbal cues for funcitonal problem solving.  SLP Short Term Goal 5 (Week 1): Pt will identify 1 phsyical and 1 cognitive deficit that is related to acute illness with Mod A cues.   Skilled Therapeutic Interventions: Skilled treatment session focused on dysphagia goals and continued patient/family education. Upon arrival, patient was consuming snack of Dys. 1 textures with nectar-thick liquids via tsp. Patient consumed snack without overt s/s of aspiration. Patient and wife asking if patient can have small amount of thin liquids (milk) with his cereal.  Both the patient and his wife were re-educated in regards to patient's current swallowing function and clinical reasoning for diet recommendations. Portions of the MBS was also shown to the patient and wife to reinforce information. Both verbalized understanding but will need reinforcement. Patient left upright in recliner with all needs within reach. Continue with current plan of care.      Function:  Eating Eating   Modified Consistency Diet: Yes Eating Assist Level: Supervision or verbal cues   Eating Set Up Assist For: Opening containers       Cognition Comprehension Comprehension assist level: Follows basic  conversation/direction with no assist  Expression   Expression assist level: Expresses basic 90% of the time/requires cueing < 10% of the time.  Social Interaction Social Interaction assist level: Interacts appropriately with others - No medications needed.  Problem Solving Problem solving assist level: Solves basic 75 - 89% of the time/requires cueing 10 - 24% of the time  Memory Memory assist level: Recognizes or recalls 75 - 89% of the time/requires cueing 10 - 24% of the time    Pain Pain Assessment Pain Assessment: No/denies pain  Therapy/Group: Individual Therapy  Herndon Grill 05/23/2017, 4:14 PM

## 2017-05-23 NOTE — Progress Notes (Signed)
Physical Therapy Weekly Progress Note  Patient Details  Name: William [name] MRN: [id number] Date of Birth: [birth year]  Beginning of progress report period: May 14, 2017 End of progress report period: May 23, 2017  Today's Date: 05/23/2017 PT Individual Time: 1115-1200 PT Individual Time Calculation (min): 45 min   Patient has met 2 of 4 short term goals.  Pt limited during today's section by BP, unable to progress gait and stairs.   Patient continues to demonstrate the following deficits muscle weakness, decreased cardiorespiratoy endurance, impaired timing and sequencing, unbalanced muscle activation and decreased coordination, decreased problem solving and delayed processing and decreased standing balance, decreased postural control and decreased balance strategies and therefore will continue to benefit from skilled PT intervention to increase functional independence with mobility.  Patient progressing toward long term goals..  Continue plan of care.  PT Short Term Goals Week 1:  PT Short Term Goal 1 (Week 1): Pt will increasebed mobility to S.  PT Short Term Goal 1 - Progress (Week 1): Met PT Short Term Goal 2 (Week 1): Pt will increase transfers bed to chair to S.  PT Short Term Goal 2 - Progress (Week 1): Met PT Short Term Goal 3 (Week 1): Pt will ambulate with rolling walker and S about 150 feet. PT Short Term Goal 3 - Progress (Week 1): Not met PT Short Term Goal 4 (Week 1): Pt will ascend/descend 2 step x 2 with rolling walker.  PT Short Term Goal 4 - Progress (Week 1): Not met Week 2:  PT Short Term Goal 1 (Week 2): =LTGs due to estimated length of stay  Skilled Therapeutic Interventions/Progress Updates:    Pt sitting in recliner upon arrival with complaints of overall weakness and fatigue with no c/o of pain. Pt performed sit to stand using RW with supervision having c/o of being dizzy and light headed upon standing. Pt returned to sitting with BP at 154/88 and upon standing  again BP had dropped to 104/87 with complaints of dizziness again. Cleared by RN to participate in activity as tolerated. Pt performed squat pivot transfer from recliner to w/c with supervision. Pt participated in LE activity on the Kinetron x2 to fatigue for activity tolerance. Pt consumed 6oz of nectar thick cranberry juice throughout session with a few throat clears but no other signs of aspiration. Pt performed squat pivot transfer back into the recliner with supervision and left with call bell in place.   Therapy Documentation Precautions:  Precautions Precautions: Fall Precaution Comments: B Nephrostomy tubes Restrictions Weight Bearing Restrictions: No   See Function Navigator for Current Functional Status.  Therapy/Group: Individual Therapy  [name] 05/23/2017, 12:12 PM

## 2017-05-23 NOTE — Progress Notes (Signed)
[place] PHYSICAL MEDICINE & REHABILITATION     PROGRESS NOTE  Subjective/Complaints:  Pt seen laying in bed this AM.  He states he did not sleep at all overnight.  Per RT, nursing, nursing tech pt slept well until 4AM. Pt also refusing CPAP.  ROS: Denies CP, SOB, N/V/D.  Objective: Vital Signs: Blood pressure (!) 138/98, pulse 70, temperature 98.6 F (37 C), temperature source Oral, resp. rate 19, height 6' 1.5" (1.867 m), weight 90.6 kg (199 lb 11.8 oz), SpO2 98 %. No results found.  Recent Labs  05/22/17 0450  WBC 9.3  HGB 11.1*  HCT 34.5*  PLT 361    Recent Labs  05/21/17 0438 05/22/17 0450  NA 139 139  K 3.9 4.1  CL 105 106  GLUCOSE 100* 122*  BUN 15 15  CREATININE 1.18 1.23  CALCIUM 8.7* 8.8*   CBG (last 3)   Recent Labs  05/22/17 1641 05/22/17 2112 05/23/17 0634  GLUCAP 130* 149* 97    Wt Readings from Last 3 Encounters:  05/23/17 90.6 kg (199 lb 11.8 oz)  05/12/17 92.5 kg (204 lb)  01/05/17 104.3 kg (230 lb)    Physical Exam:  BP (!) 138/98 (BP Location: Left Arm)   Pulse 70   Temp 98.6 F (37 C) (Oral)   Resp 19   Ht 6' 1.5" (1.867 m)   Wt 90.6 kg (199 lb 11.8 oz)   SpO2 98%   BMI 25.99 kg/m  Constitutional: He appears well-developed. Vitalsreviewed. NAD. HENT: Normocephalic. Atraumatic Eyes: EOMare normal. No discharge.  Cardiovascular: IRIR Respiratory: Effort normal and breath sounds normal.  GI: Soft. Bowel sounds are normal.  Neurological: He is alert.  Delay in processing.  HOH Motor: Grossly 5/5 b/l UE B/l LE 4+/5 RLE, 4+/5 LLE  (unchanged) Skin. Warm and dry. Intact.  Assessment/Plan: 1. Functional deficits secondary to bilateral hydronephrosis status post stenting 05/09/2017 as well as bilateral nephrostomy tubes which require 3+ hours per day of interdisciplinary therapy in a comprehensive inpatient rehab setting. Physiatrist is providing close team supervision and 24 hour management of active medical problems listed  below. Physiatrist and rehab team continue to assess barriers to discharge/monitor patient progress toward functional and medical goals.  Function:  Bathing Bathing position   Position: Shower  Bathing parts Body parts bathed by patient: Right arm, Left arm, Chest, Abdomen, Right upper leg, Left upper leg, Right lower leg, Left lower leg, Front perineal area, Buttocks Body parts bathed by helper: Back  Bathing assist Assist Level: Touching or steadying assistance(Pt > 75%)      Upper Body Dressing/Undressing Upper body dressing   What is the patient wearing?: Pull over shirt/dress     Pull over shirt/dress - Perfomed by patient: Thread/unthread left sleeve, Put head through opening, Pull shirt over trunk, Thread/unthread right sleeve          Upper body assist Assist Level: Supervision or verbal cues      Lower Body Dressing/Undressing Lower body dressing   What is the patient wearing?: Pants, Underwear, Non-skid slipper socks Underwear - Performed by patient: Thread/unthread right underwear leg, Thread/unthread left underwear leg, Pull underwear up/down   Pants- Performed by patient: Thread/unthread right pants leg, Thread/unthread left pants leg, Pull pants up/down   Non-skid slipper socks- Performed by patient: Don/doff right sock, Don/doff left sock Non-skid slipper socks- Performed by helper: Don/doff right sock, Don/doff left sock Socks - Performed by patient: Don/doff right sock, Don/doff left sock   Shoes - Performed  by patient: Don/doff right shoe, Don/doff left shoe, Fasten right, Fasten left            Lower body assist Assist for lower body dressing: Touching or steadying assistance (Pt > 75%)      Toileting Toileting Toileting activity did not occur: No continent bowel/bladder event Toileting steps completed by patient: Adjust clothing prior to toileting, Performs perineal hygiene, Adjust clothing after toileting Toileting steps completed by helper:  Adjust clothing prior to toileting, Performs perineal hygiene, Adjust clothing after toileting Toileting Assistive Devices: Grab bar or rail, Toilet aid  Toileting assist Assist level: Touching or steadying assistance (Pt.75%)   Transfers Chair/bed transfer   Chair/bed transfer method: Stand pivot Chair/bed transfer assist level: Touching or steadying assistance (Pt > 75%) Chair/bed transfer assistive device: Armrests, Medical sales representative     Max distance: 154ft Assist level: Supervision or verbal cues   Wheelchair   Type: Manual Max wheelchair distance: 100 ft Assist Level: Supervision or verbal cues  Cognition Comprehension Comprehension assist level: Follows basic conversation/direction with no assist  Expression Expression assist level: Expresses basic 90% of the time/requires cueing < 10% of the time.  Social Interaction Social Interaction assist level: Interacts appropriately with others - No medications needed.  Problem Solving Problem solving assist level: Solves basic 75 - 89% of the time/requires cueing 10 - 24% of the time  Memory Memory assist level: Recognizes or recalls 75 - 89% of the time/requires cueing 10 - 24% of the time    Medical Problem List and Plan: 1. Debilitation secondary to acute renal failure with bilateral hydronephrosis status post stenting 05/09/2017 as well as bilateral nephrostomy tubes 05/10/2017  Cont CIR  Tubes recs per Urology  2. DVT Prophylaxis/Anticoagulation:Chronic Eliquis 3. Pain Management: Tylenol 4. Mood/delirium: Trazodone 50 mg daily at bedtime, Ativan 1 mg as needed anxiety/sleep 5. Neuropsych: This patient iscapable of making decisions on hisown behalf. 6. Skin/Wound Care: Routine skin checks 7. Fluids/Electrolytes/Nutrition: Routine I&Os  Diet liberalized per daughter request 8.AKI/hypernatremiafelt to be secondary to obstruction.  Cont to monitor  Appears to have stabilized, Cr 1.23 on  6/25 9.Hypertension. Norvasc 5 mg daily at bedtime  Relatively controlled 6/26 Vitals:   05/22/17 2025 05/23/17 0448  BP: (!) 133/57 (!) 138/98  Pulse: 78 70  Resp:  19  Temp:  98.6 F (37 C)   10.Atrial fibrillation.Eliquisresumed. Cardiac rate control 11.Coag-negative staph UTI.   Blood cultures NG.   Repeat urine culture NG.   IV rocephin d/ced 12.Type 2 diabetes mellitus. Hemoglobin A1c 6.8. Sliding scale insulin.   Levemir 6 units started on 6/19 (home 16 units)  Relatively controlled 6/26 CBG (last 3)   Recent Labs  05/22/17 1641 05/22/17 2112 05/23/17 0634  GLUCAP 130* 149* 97   13.History of prostate cancer and radiation therapy 14.Hyperlipidemia. Pravachol 15. Hypoalbuminemia  Supplement initiated  16. Leukocytosis: Resolved  WBCs 9.3 on 6/25  Improving  Afrebrile  Cont to monitor 17. ABLA  Hb 11.1 on 6/25  Cont to monitor 18. Sleep disturbance  Trazodone 100qhs changed to seroquel 12.5qhs on 6/26  Pt needs to be complaint with CPAP  LOS (Days) 10 A FACE TO FACE EVALUATION WAS PERFORMED  Brittanny Levenhagen Lorie Phenix 05/23/2017 7:02 AM

## 2017-05-23 NOTE — Progress Notes (Signed)
Pt is resting comfortably at this time.  Per RN, pt has been refusing cpap at night.  Pt/RN to call RT if pt changes his mind.

## 2017-05-23 NOTE — Progress Notes (Signed)
Occupational Therapy Session Note  Patient Details  Name: William [name] MRN: [id number] Date of Birth: [birth year]  Today's Date: 05/23/2017 OT Individual Time: 0901-1001 OT Individual Time Calculation (min): 60 min    Short Term Goals: Week 2:  OT Short Term Goal 1 (Week 2): Continue working on established LTGs set at supervision level overall.    Skilled Therapeutic Interventions/Progress Updates:    Pt presented supine in bed ready for OT tx session. Focus of session on room level functional mobility and basic ADL completion. Pt completed sit<>stand at [facility] with MinA throughout session. Completed room level functional mobility with supervision to complete toileting followed by bathing at shower level. Pt requires assist for washing back and MinA to steady while standing to wash buttocks, able to wash all other body parts with supervision. Following shower Pt donned underwear with MinA to steady while standing at RW to pull up, completed functional mobility using RW with supervision to recliner for completion of UB/LB dressing. Completes UB dressing with setup, LB dressing with MinA to steady while standing to pull up shorts, set up assist to don socks (therapist assist for donning TEDs). Pt left with call bell and all needs within reach, did not don QRB as Pt's spouse present and planning to stay with Pt.   Therapy Documentation Precautions:  Precautions Precautions: Fall Precaution Comments: B Nephrostomy tubes Restrictions Weight Bearing Restrictions: No   Pain: Pain Assessment Pain Assessment: No/denies pain  See Function Navigator for Current Functional Status.   Therapy/Group: Individual Therapy  Raymondo Band 05/23/2017, 3:46 PM

## 2017-05-24 ENCOUNTER — Inpatient Hospital Stay (HOSPITAL_COMMUNITY): Payer: Medicare Other | Admitting: Speech Pathology

## 2017-05-24 ENCOUNTER — Encounter (HOSPITAL_COMMUNITY): Payer: Medicare Other | Admitting: Psychology

## 2017-05-24 ENCOUNTER — Inpatient Hospital Stay (HOSPITAL_COMMUNITY): Payer: Medicare Other | Admitting: Physical Therapy

## 2017-05-24 ENCOUNTER — Inpatient Hospital Stay (HOSPITAL_COMMUNITY): Payer: Medicare Other | Admitting: Occupational Therapy

## 2017-05-24 DIAGNOSIS — F411 Generalized anxiety disorder: Secondary | ICD-10-CM

## 2017-05-24 LAB — GLUCOSE, CAPILLARY
GLUCOSE-CAPILLARY: 115 mg/dL — AB (ref 65–99)
GLUCOSE-CAPILLARY: 128 mg/dL — AB (ref 65–99)
Glucose-Capillary: 106 mg/dL — ABNORMAL HIGH (ref 65–99)
Glucose-Capillary: 151 mg/dL — ABNORMAL HIGH (ref 65–99)

## 2017-05-24 MED ORDER — LORAZEPAM 0.5 MG PO TABS
1.0000 mg | ORAL_TABLET | ORAL | Status: DC | PRN
  Administered 2017-05-24 – 2017-05-26 (×2): 1 mg via ORAL
  Filled 2017-05-24: qty 2

## 2017-05-24 MED ORDER — QUETIAPINE FUMARATE 25 MG PO TABS
25.0000 mg | ORAL_TABLET | Freq: Every day | ORAL | Status: DC
  Administered 2017-05-24 – 2017-05-25 (×2): 25 mg via ORAL
  Filled 2017-05-24 (×2): qty 1

## 2017-05-24 NOTE — Progress Notes (Signed)
Social Work Patient ID: William [name], male   DOB: 12-30-1933, 83 y.o.   MRN: [id number]  Met with pt and wife to discuss team conference progressing toward his goals of supervision level and will be ready for discharge 6/30. Wife here and agreeable to this plan, feels pt had a better night last night. Will await therapy equipment recommendations and inform [facility] of discharge date. Daughter coming Sat from [place] to stay and assist with Transition home.

## 2017-05-24 NOTE — Progress Notes (Signed)
Physical Therapy Session Note  Patient Details  Name: William [name] MRN: [id number] Date of Birth: 12-Jul-1934  Today's Date: 05/24/2017 PT Individual Time: 0910-0950 1100-1200 PT Individual Time Calculation (min): 40 min and 60 min  Short Term Goals: Week 2:  PT Short Term Goal 1 (Week 2): =LTGs due to estimated length of stay  Skilled Therapeutic Interventions/Progress Updates: Pt presented in recliner awake and agreeable to therapy. Sitting BP 108/65, performed stand pivot with RW to w/c, pt required cues for scooting forward in chair and breathing through movement as pt required x 3 attempts to achieve standing. Pt indicating some "wooziness" in standing BP 93/63. Once in w/c pt propelled to rehab gym. Pt encouraged to use BLE for LE strengthening and endurance. Pt required frequent cues for use of RLE as would keep R leg extended. Performed sit to/from stand x 5 with LLE on 2in block for increased use/wt bearing on RLE.  Pt required rest breaks between transfers due to fatigue. Pt returned to w/c and transported back to room for time management. Pt returned to recliner in same manner as prior and left with wife present and all current needs met.   Tx2: Pt presented in recliner asleep but easily aroused but groggy. Pt required several minutes and wet washcloth to fully arouse. Performed stand pivot to w/c and propelled to rehab gym.  NuStep L1 x 7 min with pt unable to perform longer due to fatigue. Transferred to mat, performed alternating toe taps and toe taps to target for standing tolerance and balance. Pt required frequent cues for sequencing and recall of action. Pt also required frequent rest breaks throughout session due to fatigue. Wife concerned as pt seems to have delayed response to commands this session and per her in last few day. Adv nsg wife's concerns. Pt able to ambulate approx 8f with min guard then requiring seated rest. Pt returned to recliner in same manner as prior and left  with all current needs met.      Therapy Documentation Precautions:  Precautions Precautions: Fall Precaution Comments: B Nephrostomy tubes Restrictions Weight Bearing Restrictions: No General:   Vital Signs:   Pain: Pain Assessment Pain Assessment: No/denies pain   See Function Navigator for Current Functional Status.   Therapy/Group: Individual Therapy  Sissy Goetzke  Durand Wittmeyer, PTA  05/24/2017, 12:23 PM

## 2017-05-24 NOTE — Patient Care Conference (Signed)
Inpatient RehabilitationTeam Conference and Plan of Care Update Date: 05/24/2017   Time: 8:15 AM    Patient Name: William [name]      Medical Record Number: [id number]  Date of Birth: January 07, 1934 Sex: Male         Room/Bed: 4M01C/4M01C-01 Payor Info: Payor: MEDICARE / Plan: MEDICARE PART A AND B / Product Type: *No Product type* /    Admitting Diagnosis: Uretercal Structures  Admit Date/Time:  05/13/2017  2:01 PM Admission Comments: No comment available   Primary Diagnosis:  <principal problem not specified> Principal Problem: <principal problem not specified>  Patient Active Problem List   Diagnosis Date Noted  . Anxiety state   . Sleep disturbance   . Labile blood glucose   . Debilitated 05/13/2017  . Protein-calorie malnutrition, severe 05/10/2017  . History of prostate cancer   . Hydronephrosis, bilateral   . AKI (acute kidney injury) ([place])   . Diabetes mellitus type 2 in nonobese ([facility])   . Atrial fibrillation ([place])   . Benign essential HTN   . Leukocytosis   . Acute blood loss anemia   . Hydronephrosis   . Acute renal failure (ARF) ([place]) 05/06/2017  . Acute lower UTI 05/06/2017  . Occult GI bleeding 05/06/2017  . Stroke ([place]) 10/26/2016  . Obese 05/07/2014  . Expected blood loss anemia 05/07/2014  . S/P right TKA 05/06/2014  . DJD (degenerative joint disease) of knee 03/25/2014  . Preop cardiovascular exam 03/19/2014  . Abnormal ECG 03/19/2014  . Hypertension   . IDDM (insulin dependent diabetes mellitus) ([place])   . Hypercholesteremia   . Arthritis   . Prostate cancer ([place])   . Sleep apnea     Expected Discharge Date: Expected Discharge Date: 05/27/17  Team Members Present: Physician leading conference: Dr. Delice Lesch Social Worker Present: Ovidio Kin, LCSW Nurse Present: Rayetta Humphrey, RN PT Present: Other (comment) (Ben Zanion & Glenice Bow) OT Present: Willeen Cass, OT SLP Present: Windell Moulding, SLP PPS Coordinator present : Daiva Nakayama, RN,  CRRN     Current Status/Progress Goal Weekly Team Focus  Medical   Debilitation secondary to acute renal failure with bilateral hydronephrosis status post stenting 05/09/2017 as well as bilateral nephrostomy tubes 05/10/2017  Improve mobility, sleep  See above   Bowel/Bladder   Continent to bowel.Nephrostomy tubes still in place.LBM 6/27.  Cntinue continent to bowel with min. assisst.Nephrostomy tubes monitoring Q shift with no complications.  To monitor bladder and bowel function Q shift.   Swallow/Nutrition/ Hydration   regular, nectar thick via teaspoon; full supervision   mod I   trials of water per the water protocol, family education   ADL's   supervision for UB ADLs, MinA for LB ADLs and sit<>stand at [facility], supervision for functional mobility with RW  supervision overall  selfcare retratining, dynamic balance, transfer training, neuromuscular re-ed, pt/family education, therapeutic exercise    Mobility   supervision with transfers, mod I bed mobility, ambulating 104 ft with rw  supervision  gait, balance, pt/family edu, D/C planning   Communication             Safety/Cognition/ Behavioral Observations  moderate cognitive impairments   mod assist   orientation, problem solving, memory    Pain   No c/o of pain.  Keep pain levels less than 3.  To monitor pain levels Q 2 hrs. and PRN.   Skin   MASD improving.Nystantin cream schedule.  To keep skin free of pressure sores.  To assess skin  Q shift.      *See Care Plan and progress notes for long and short-term goals.  Barriers to Discharge: Safety, mobility, sleep    Possible Resolutions to Barriers:  Therapies, adjusting sleep meds, needs to be complaint with CPAP    Discharge Planning/Teaching Needs:  Wife is here daily and participates in therapies with pt. Concerned aobut BP issues and it dropping, now on nectar thick diet.       Team Discussion:  Goals being met of supervision level. MD following up on BP and creatine  back to baseline. Pushing po fluids. On nectar by teaspoon and water protocol. Wife here for education and participation in therapies. Prepare for DC Sat.  Revisions to Treatment Plan:  DC Sat 6/30   Continued Need for Acute Rehabilitation Level of Care: The patient requires daily medical management by a physician with specialized training in physical medicine and rehabilitation for the following conditions: Daily direction of a multidisciplinary physical rehabilitation program to ensure safe treatment while eliciting the highest outcome that is of practical value to the patient.: Yes Daily medical management of patient stability for increased activity during participation in an intensive rehabilitation regime.: Yes Daily analysis of laboratory values and/or radiology reports with any subsequent need for medication adjustment of medical intervention for : Post surgical problems;Renal problems;Other  Elease Hashimoto 05/25/2017, 12:32 PM

## 2017-05-24 NOTE — Progress Notes (Signed)
[place] PHYSICAL MEDICINE & REHABILITATION     PROGRESS NOTE  Subjective/Complaints:  Pt seen laying in bed this AM.  Pt states he did not sleep well because he had a bad dream overnight. Spoke with nursing, pt did not sleep well overnight.   ROS: Denies CP, SOB, N/V/D.  Objective: Vital Signs: Blood pressure 132/69, pulse 70, temperature 97.7 F (36.5 C), temperature source Axillary, resp. rate 18, height 6' 1.5" (1.867 m), weight 88.9 kg (195 lb 15.8 oz), SpO2 98 %. No results found.  Recent Labs  05/22/17 0450  WBC 9.3  HGB 11.1*  HCT 34.5*  PLT 361    Recent Labs  05/22/17 0450  NA 139  K 4.1  CL 106  GLUCOSE 122*  BUN 15  CREATININE 1.23  CALCIUM 8.8*   CBG (last 3)   Recent Labs  05/23/17 1646 05/23/17 2139 05/24/17 0623  GLUCAP 132* 129* 106*    Wt Readings from Last 3 Encounters:  05/24/17 88.9 kg (195 lb 15.8 oz)  05/12/17 92.5 kg (204 lb)  01/05/17 104.3 kg (230 lb)    Physical Exam:  BP 132/69 (BP Location: Right Arm)   Pulse 70   Temp 97.7 F (36.5 C) (Axillary)   Resp 18   Ht 6' 1.5" (1.867 m)   Wt 88.9 kg (195 lb 15.8 oz)   SpO2 98%   BMI 25.51 kg/m  Constitutional: He appears well-developed. Vitalsreviewed. NAD. HENT: Normocephalic. Atraumatic Eyes: EOMare normal. No discharge.  Cardiovascular: IRIR Respiratory: Effort normal and breath sounds normal.  GI: Soft. Bowel sounds are normal.  Neurological: He is alert.  Delay in processing.  HOH Motor: Grossly 5/5 b/l UE B/l LE 4+/5 RLE, 4+/5 LLE  (stable) Skin. Warm and dry. Intact.  Assessment/Plan: 1. Functional deficits secondary to bilateral hydronephrosis status post stenting 05/09/2017 as well as bilateral nephrostomy tubes which require 3+ hours per day of interdisciplinary therapy in a comprehensive inpatient rehab setting. Physiatrist is providing close team supervision and 24 hour management of active medical problems listed below. Physiatrist and rehab team  continue to assess barriers to discharge/monitor patient progress toward functional and medical goals.  Function:  Bathing Bathing position   Position: Shower  Bathing parts Body parts bathed by patient: Right arm, Left arm, Chest, Abdomen, Right upper leg, Left upper leg, Right lower leg, Left lower leg, Front perineal area, Buttocks Body parts bathed by helper: Back  Bathing assist Assist Level: Touching or steadying assistance(Pt > 75%)      Upper Body Dressing/Undressing Upper body dressing   What is the patient wearing?: Pull over shirt/dress     Pull over shirt/dress - Perfomed by patient: Thread/unthread left sleeve, Put head through opening, Pull shirt over trunk, Thread/unthread right sleeve          Upper body assist Assist Level: Supervision or verbal cues      Lower Body Dressing/Undressing Lower body dressing   What is the patient wearing?: Pants, Underwear, Non-skid slipper socks, Ted Hose Underwear - Performed by patient: Thread/unthread right underwear leg, Thread/unthread left underwear leg, Pull underwear up/down   Pants- Performed by patient: Thread/unthread right pants leg, Thread/unthread left pants leg, Pull pants up/down   Non-skid slipper socks- Performed by patient: Don/doff right sock, Don/doff left sock Non-skid slipper socks- Performed by helper: Don/doff right sock, Don/doff left sock Socks - Performed by patient: Don/doff right sock, Don/doff left sock   Shoes - Performed by patient: Don/doff right shoe, Don/doff left shoe, Fasten  right, Fasten left         TED Hose - Performed by helper: Don/doff right TED hose, Don/doff left TED hose  Lower body assist Assist for lower body dressing: Touching or steadying assistance (Pt > 75%)      Toileting Toileting Toileting activity did not occur: No continent bowel/bladder event Toileting steps completed by patient: Performs perineal hygiene Toileting steps completed by helper: Adjust clothing prior  to toileting, Performs perineal hygiene, Adjust clothing after toileting Toileting Assistive Devices: Grab bar or rail  Toileting assist Assist level: Touching or steadying assistance (Pt.75%)   Transfers Chair/bed transfer   Chair/bed transfer method: Stand pivot Chair/bed transfer assist level: Touching or steadying assistance (Pt > 75%) Chair/bed transfer assistive device: Armrests, Medical sales representative     Max distance: room level  Assist level: Supervision or verbal cues   Wheelchair   Type: Manual Max wheelchair distance: 100 ft Assist Level: Supervision or verbal cues  Cognition Comprehension Comprehension assist level: Follows basic conversation/direction with no assist  Expression Expression assist level: Expresses basic 90% of the time/requires cueing < 10% of the time.  Social Interaction Social Interaction assist level: Interacts appropriately with others - No medications needed.  Problem Solving Problem solving assist level: Solves basic 75 - 89% of the time/requires cueing 10 - 24% of the time  Memory Memory assist level: Recognizes or recalls 75 - 89% of the time/requires cueing 10 - 24% of the time    Medical Problem List and Plan: 1. Debilitation secondary to acute renal failure with bilateral hydronephrosis status post stenting 05/09/2017 as well as bilateral nephrostomy tubes 05/10/2017  Cont CIR  Follow up tubes as outpt 2. DVT Prophylaxis/Anticoagulation:Chronic Eliquis 3. Pain Management: Tylenol 4. Mood/delirium: Trazodone 50 mg daily at bedtime, Ativan 1 mg as needed anxiety/sleep 5. Neuropsych: This patient iscapable of making decisions on hisown behalf. 6. Skin/Wound Care: Routine skin checks 7. Fluids/Electrolytes/Nutrition: Routine I&Os  Diet liberalized per daughter request 8.AKI/hypernatremiafelt to be secondary to obstruction.  Cont to monitor  Appears to have stabilized, Cr 1.23 on 6/25 9.Hypertension. Norvasc 5 mg daily  at bedtime  Relatively controlled 6/27 Vitals:   05/23/17 1454 05/24/17 0633  BP: 139/73 132/69  Pulse: 67 70  Resp: 18 18  Temp: 97.9 F (36.6 C) 97.7 F (36.5 C)   10.Atrial fibrillation.Eliquisresumed. Cardiac rate control 11.Coag-negative staph UTI.   Blood cultures NG.   Repeat urine culture NG.   IV rocephin d/ced 12.Type 2 diabetes mellitus. Hemoglobin A1c 6.8. Sliding scale insulin.   Levemir 6 units started on 6/19 (home 16 units)  Relatively controlled 6/27 CBG (last 3)   Recent Labs  05/23/17 1646 05/23/17 2139 05/24/17 0623  GLUCAP 132* 129* 106*   13.History of prostate cancer and radiation therapy 14.Hyperlipidemia. Pravachol 15. Hypoalbuminemia  Supplement initiated  16. Leukocytosis: Resolved  WBCs 9.3 on 6/25  Improving  Afrebrile  Cont to monitor 17. ABLA  Hb 11.1 on 6/25  Cont to monitor 18. Sleep disturbance  Trazodone 100qhs changed to seroquel 12.5qhs on 6/26, increased to 25 6/27  Pt needs to be complaint with CPAP  LOS (Days) 11 A FACE TO FACE EVALUATION WAS PERFORMED  Ankit Lorie Phenix 05/24/2017 7:12 AM

## 2017-05-24 NOTE — Consult Note (Signed)
Neuropsychological Consultation   Patient:   William [name]   DOB:   12-08-1933  MR Number:  [id number]  Location:  [facility] [address] [facility] [address] [id number] [place] [address] Dept: [place]: [phone]           Date of Service:   05/24/2017  Start Time:   8 PM End Time:   9 PM  Provider/Observer:  Ilean Skill, Psy.D.       Clinical Neuropsychologist       Billing Code/Service: [phone] 4 Units  Chief Complaint:    William [name] is an 83 year old male referred for neuropsychological consult due to coping issues, anxiety, and some cognitive issues that worsened with recent renal issues.  The patient had CVA in November of 2017 but had been doing well.  He was actively reading and remembering what he read but did have some anxiety upon waking in the morning until he was oriented.  Reason for Service:  William [name] is an 83 year old male referred for neuropsychological consult due to coping issues, anxiety, and some cognitive issues that worsened with recent renal issues.  The patient had CVA in November of 2017 but had been doing well.  He was actively reading and remembering what he read but did have some anxiety upon waking in the morning until he was oriented.  Below is the HPI for the current admission.  William [name] is an 83 y.o.right handed malewith history of Diabetes mellitus, hypertension, prostate cancer with radiation therapy, CVA November 2017and did not receive inpatient rehabilitation services discharged to home at supervision level, A. fib maintained on Eliquis, right TKA 2015. Per chart review and familypatient lives with spouse, who can only provide supervision at discharge. Independent driving prior to admission. One level home.Wife limited physical assistance.Presented 05/06/2017 with generalized weakness/lethargy, weight loss and recurrent falls over the past 2 weeks as well  as reported urinary frequency and nocturia. Creatinine elevated 4.103from baseline 1.01 BUN 43. Recent CT revealed left hydronephrosis with dilation of the ureter down to the pelvic brim ofthe iliac and then the ureter appeared to taper to a normal caliber. Ultrasound follow-up revealed moderate left hydronephrosis. Urology consulted and underwent cystoscopy with bilateral retrograde myelogram with findings of ureteral strictures and underwent stent placement 05/09/2017 per Dr Junious Silk as well as bilateral nephrostomy tubes per interventional radiology 05/10/2017. Nephrology consulted for AKIfelt to be mostly obstructive and continue to monitorhis creatinine 1.57. Chronic Eliquisresumed 05/11/2017.Coag negative staph UTI and completing course of doxycycline and monitoring white blood cell count 16,000 as patient remains afebrile.Patient with intermittent bouts of restlessness agitation requiring Ativan.Therapy evaluations ongoing. M.D. has requested physical medicine rehabilitation consult.Patient was admitted for a comprehensive rehabilitation program. . Current Status:  The patient is having some naming issues and rapid recall of episodic information.  He is oriented x4.  Memory and recall was good prior to renal issues even after the CVA in 2017.  He has some anxiety when waking but that resolves quickly.  Reliability of Information: Information from 1 hour face to face with patient and wife, review of medical records and review of case with treatment team members.  Behavioral Observation: William [name]  presents as a 83 y.o.-year-old Right Caucasian Male who appeared his stated age. his dress was Appropriate and he was Well Groomed and his manners were Appropriate to the situation.  his participation was indicative of Appropriate and  Drowsy behaviors.  There were physical disabilities noted related to weakness and fatigue.  he displayed an appropriate level of cooperation and motivation.      Interactions:    Active Appropriate and Drowsy  Attention:   abnormal and attention span appeared shorter than expected for age  Memory:   abnormal; remote memory intact, recent memory impaired  Visuo-spatial:  within normal limits  Speech (Volume):  low  Speech:   normal; normal  Thought Process:  Coherent  Though Content:  WNL;   Orientation:   person, place, time/date and situation  Judgment:   Good  Planning:   Good  Affect:    Anxious  Mood:    Anxious  Insight:   Good  Intelligence:   high  Marital Status/Living: The patient is married and living with wife who is assisting with his care as much as she can.  Substance Use:  No concerns of substance abuse are reported.    Medical History:   Past Medical History:  Diagnosis Date  . Arthritis   . Diabetes mellitus without complication ([place])   . Hypercholesteremia   . Hypertension   . Kidney stones   . Prostate cancer [facility])    with radiation treatment  . Sleep apnea 2/14   "mild per patient" hasnt gotten CPAP machine  . Stroke [facility])             Psychiatric History:  No specific psychiatric history other than development of anxiety at am waking.  Family Med/Psych History:  Family History  Problem Relation Age of Onset  . Colon cancer Father   . Stroke Maternal Grandmother   . Esophageal cancer Neg Hx   . Rectal cancer Neg Hx   . Stomach cancer Neg Hx     Risk of Suicide/Violence: virtually non-existent   Impression/DX:  William [name] is an 83 year old male referred for neuropsychological consult due to coping issues, anxiety, and some cognitive issues that worsened with recent renal issues.  The patient had CVA in November of 2017 but had been doing well.  He was actively reading and remembering what he read but did have some anxiety upon waking in the morning until he was oriented.   He has had treatment for prior stroke (Nov 2017) and prostate cancer.  Kidney issues created debility.     Disposition/Plan:  Worked with the patient with regard to coping issues and anxiety issues as well as assessed mental status.           Electronically Signed   _______________________ Ilean Skill, Psy.D.

## 2017-05-24 NOTE — Progress Notes (Signed)
Occupational Therapy Session Note  Patient Details  Name: William [name] MRN: [id number] Date of Birth: 24-Oct-1934  Today's Date: 05/24/2017 OT Individual Time: 1303-1400 OT Individual Time Calculation (min): 57 min    Short Term Goals: Week 2:  OT Short Term Goal 1 (Week 2): Continue working on established LTGs set at supervision level overall.    Skilled Therapeutic Interventions/Progress Updates:    Upon entering the room, pt seated in recliner chair with wife present in room. Pt with reports of , " I feel weak." BP taken with results of 116/61. RN arriving to give medications. Pt verbalized urgent need for toileting. Pt ambulating with RW and steady assistance into bathroom. Pt incontinent of bowel and needing assistance to change clothing. Pt performed hygiene himself while seated on toilet with close supervision for safety. Pt washing hands at sink with supervision before returning to recliner chair. OT provided pt and caregiver with paper handout regarding energy conservation for self care task and community mobility. They asked questions as appropriate. Education to continue. Pt remained in recliner chair at end of session with call bell and all needed items within reach.  Therapy Documentation Precautions:  Precautions Precautions: Fall Precaution Comments: B Nephrostomy tubes Restrictions Weight Bearing Restrictions: No Pain: Pain Assessment Pain Assessment: No/denies pain  See Function Navigator for Current Functional Status.   Therapy/Group: Individual Therapy  Gypsy Decant 05/24/2017, 2:12 PM

## 2017-05-24 NOTE — Progress Notes (Addendum)
Placed patient on his home CPAP.  Patient did not tolerate .  Patient became very agitated and began pulling at mask. CPAP was removed.

## 2017-05-24 NOTE — Progress Notes (Signed)
Speech Language Pathology Daily Session Note  Patient Details  Name: William [name] MRN: [id number] Date of Birth: [birth year]  Today's Date: 05/24/2017 SLP Individual Time: 1005-1030 SLP Individual Time Calculation (min): 25 min  Short Term Goals: Week 1: SLP Short Term Goal 1 (Week 1): Pt will consume current diet without overt s/s of aspiration and Mod I cues for use of compensatory swallowing strategies.  SLP Short Term Goal 2 (Week 1): Pt will utilize external memory aids to recall new, daily information with Mod A cues.  SLP Short Term Goal 3 (Week 1): Pt will consistently demosntrate O x 4 with Min A cues for use of external aids. SLP Short Term Goal 4 (Week 1): Pt will complete basic familiar tasks iwth Mod A verbal cues for funcitonal problem solving.  SLP Short Term Goal 5 (Week 1): Pt will identify 1 phsyical and 1 cognitive deficit that is related to acute illness with Mod A cues.   Skilled Therapeutic Interventions:  Pt was seen for skilled ST targeting dysphagia goals.   Pt needed min-mod question cues for recall of swallowing precautions.  Therapist facilitated the session with trials of thin liquids via teaspoon to continue working towards diet progression.  Pt demonstrated no overt s/s of aspiration and used swallowing precautions with supervision cues.  Pt's wife was present and verbalized understanding of how to thicken liquids as well as parameters of the water protocol with mod I.  Pt was left in recliner with wife at bedside.  All questions answered to their satisfaction at this time.  Continue per current plan of care.       Function:  Eating Eating   Modified Consistency Diet: Yes Eating Assist Level: Supervision or verbal cues           Cognition Comprehension Comprehension assist level: Follows basic conversation/direction with no assist  Expression   Expression assist level: Expresses basic 90% of the time/requires cueing < 10% of the time.  Social  Interaction Social Interaction assist level: Interacts appropriately with others - No medications needed.  Problem Solving Problem solving assist level: Solves basic 75 - 89% of the time/requires cueing 10 - 24% of the time  Memory Memory assist level: Recognizes or recalls 75 - 89% of the time/requires cueing 10 - 24% of the time    Pain Pain Assessment Pain Assessment: No/denies pain  Therapy/Group: Individual Therapy  Tijuan Dantes, Selinda Orion 05/24/2017, 11:47 AM

## 2017-05-25 ENCOUNTER — Inpatient Hospital Stay (HOSPITAL_COMMUNITY): Payer: Medicare Other

## 2017-05-25 ENCOUNTER — Inpatient Hospital Stay (HOSPITAL_COMMUNITY): Payer: Medicare Other | Admitting: Speech Pathology

## 2017-05-25 ENCOUNTER — Inpatient Hospital Stay (HOSPITAL_COMMUNITY): Payer: Medicare Other | Admitting: Physical Therapy

## 2017-05-25 ENCOUNTER — Inpatient Hospital Stay (HOSPITAL_COMMUNITY): Payer: Medicare Other | Admitting: Occupational Therapy

## 2017-05-25 ENCOUNTER — Encounter (HOSPITAL_COMMUNITY): Payer: Medicare Other | Admitting: Speech Pathology

## 2017-05-25 DIAGNOSIS — F411 Generalized anxiety disorder: Secondary | ICD-10-CM

## 2017-05-25 LAB — GLUCOSE, CAPILLARY
GLUCOSE-CAPILLARY: 109 mg/dL — AB (ref 65–99)
GLUCOSE-CAPILLARY: 137 mg/dL — AB (ref 65–99)
Glucose-Capillary: 81 mg/dL (ref 65–99)
Glucose-Capillary: 134 mg/dL — ABNORMAL HIGH (ref 65–99)

## 2017-05-25 MED ORDER — RENA-VITE PO TABS: 1.0000 | ORAL_TABLET | Freq: Every day | ORAL | 0 refills | Status: AC

## 2017-05-25 MED ORDER — APIXABAN 5 MG PO TABS: 5.0000 mg | ORAL_TABLET | Freq: Two times a day (BID) | ORAL | 0 refills | Status: AC

## 2017-05-25 MED ORDER — QUETIAPINE FUMARATE 25 MG PO TABS: 25.0000 mg | ORAL_TABLET | Freq: Every day | ORAL | 0 refills | Status: DC

## 2017-05-25 MED ORDER — AMLODIPINE BESYLATE 5 MG PO TABS: 5.0000 mg | ORAL_TABLET | Freq: Every day | ORAL | 0 refills | Status: AC

## 2017-05-25 MED ORDER — LORAZEPAM 1 MG PO TABS: 1.0000 mg | ORAL_TABLET | ORAL | 0 refills | Status: DC | PRN

## 2017-05-25 MED ORDER — METHOCARBAMOL 500 MG PO TABS: 500.0000 mg | ORAL_TABLET | Freq: Two times a day (BID) | ORAL | 0 refills | Status: AC | PRN

## 2017-05-25 MED ORDER — PRAVASTATIN SODIUM 40 MG PO TABS: 40.0000 mg | ORAL_TABLET | Freq: Every day | ORAL | 0 refills | Status: AC

## 2017-05-25 NOTE — Progress Notes (Signed)
[place] PHYSICAL MEDICINE & REHABILITATION     PROGRESS NOTE  Subjective/Complaints:  Pt seen laying in bed this AM.  He states he slept well overnight.  Per nursing, pt slept well after midnight.  Son at bedside, who initially states patient slept well after midnight, then states patient was restless from 11:45-1:00.  He has several questions.   ROS: Denies CP, SOB, N/V/D.  Objective: Vital Signs: Blood pressure 132/76, pulse 67, temperature 97.7 F (36.5 C), temperature source Oral, resp. rate 18, height 6' 1.5" (1.867 m), weight 88 kg (193 lb 14.4 oz), SpO2 95 %. No results found. No results for input(s): WBC, HGB, HCT, PLT in the last 72 hours. No results for input(s): NA, K, CL, GLUCOSE, BUN, CREATININE, CALCIUM in the last 72 hours.  Invalid input(s): CO CBG (last 3)   Recent Labs  05/24/17 1655 05/24/17 2103 05/25/17 0632  GLUCAP 128* 151* 81    Wt Readings from Last 3 Encounters:  05/25/17 88 kg (193 lb 14.4 oz)  05/12/17 92.5 kg (204 lb)  01/05/17 104.3 kg (230 lb)    Physical Exam:  BP 132/76 (BP Location: Right Arm)   Pulse 67   Temp 97.7 F (36.5 C) (Oral)   Resp 18   Ht 6' 1.5" (1.867 m)   Wt 88 kg (193 lb 14.4 oz)   SpO2 95%   BMI 25.24 kg/m  Constitutional: He appears well-developed. Vitalsreviewed. NAD. HENT: Normocephalic. Atraumatic Eyes: EOMare normal. No discharge.  Cardiovascular: IRIR Respiratory: Effort normal and breath sounds normal.  GI: Soft. Bowel sounds are normal.  Neurological: He is alert.  Delay in processing.  HOH Motor: Grossly 5/5 b/l UE B/l LE 4+/5 RLE, 4+/5 LLE  (unchanged) Skin. Warm and dry. Intact.  Assessment/Plan: 1. Functional deficits secondary to bilateral hydronephrosis status post stenting 05/09/2017 as well as bilateral nephrostomy tubes which require 3+ hours per day of interdisciplinary therapy in a comprehensive inpatient rehab setting. Physiatrist is providing close team supervision and 24 hour  management of active medical problems listed below. Physiatrist and rehab team continue to assess barriers to discharge/monitor patient progress toward functional and medical goals.  Function:  Bathing Bathing position   Position: Shower  Bathing parts Body parts bathed by patient: Right arm, Left arm, Chest, Abdomen, Right upper leg, Left upper leg, Right lower leg, Left lower leg, Front perineal area, Buttocks Body parts bathed by helper: Back  Bathing assist Assist Level: Touching or steadying assistance(Pt > 75%)      Upper Body Dressing/Undressing Upper body dressing   What is the patient wearing?: Pull over shirt/dress     Pull over shirt/dress - Perfomed by patient: Thread/unthread left sleeve, Put head through opening, Pull shirt over trunk, Thread/unthread right sleeve          Upper body assist Assist Level: Supervision or verbal cues      Lower Body Dressing/Undressing Lower body dressing   What is the patient wearing?: Pants, Underwear, Non-skid slipper socks, Ted Hose Underwear - Performed by patient: Thread/unthread right underwear leg, Thread/unthread left underwear leg, Pull underwear up/down   Pants- Performed by patient: Thread/unthread right pants leg, Thread/unthread left pants leg, Pull pants up/down   Non-skid slipper socks- Performed by patient: Don/doff right sock, Don/doff left sock Non-skid slipper socks- Performed by helper: Don/doff right sock, Don/doff left sock Socks - Performed by patient: Don/doff right sock, Don/doff left sock   Shoes - Performed by patient: Don/doff right shoe, Don/doff left shoe, Fasten right,  Fasten left         TED Hose - Performed by helper: Don/doff right TED hose, Don/doff left TED hose  Lower body assist Assist for lower body dressing: Touching or steadying assistance (Pt > 75%)      Toileting Toileting Toileting activity did not occur: No continent bowel/bladder event Toileting steps completed by patient:  Performs perineal hygiene Toileting steps completed by helper: Adjust clothing prior to toileting, Adjust clothing after toileting Toileting Assistive Devices: Grab bar or rail  Toileting assist Assist level: Supervision or verbal cues   Transfers Chair/bed transfer   Chair/bed transfer method: Stand pivot Chair/bed transfer assist level: Touching or steadying assistance (Pt > 75%) Chair/bed transfer assistive device: Armrests, Medical sales representative     Max distance: 10' Assist level: Touching or steadying assistance (Pt > 75%)   Wheelchair   Type: Manual Max wheelchair distance: 100 ft Assist Level: Supervision or verbal cues  Cognition Comprehension Comprehension assist level: Follows basic conversation/direction with no assist  Expression Expression assist level: Expresses basic needs/ideas: With no assist  Social Interaction Social Interaction assist level: Interacts appropriately 75 - 89% of the time - Needs redirection for appropriate language or to initiate interaction.  Problem Solving Problem solving assist level: Solves basic 75 - 89% of the time/requires cueing 10 - 24% of the time  Memory Memory assist level: Recognizes or recalls 75 - 89% of the time/requires cueing 10 - 24% of the time    Medical Problem List and Plan: 1. Debilitation secondary to acute renal failure with bilateral hydronephrosis status post stenting 05/09/2017 as well as bilateral nephrostomy tubes 05/10/2017  Cont CIR  Follow up tubes as outpt 2. DVT Prophylaxis/Anticoagulation:Chronic Eliquis 3. Pain Management: Tylenol 4. Mood/delirium: Trazodone 50 mg daily at bedtime, Ativan 1 mg as needed anxiety/sleep 5. Neuropsych: This patient iscapable of making decisions on hisown behalf. 6. Skin/Wound Care: Routine skin checks 7. Fluids/Electrolytes/Nutrition: Routine I&Os  Diet liberalized per daughter request  On regular nectar thick diet 8.AKI/hypernatremiafelt to be secondary  to obstruction.  Cont to monitor  Appears to have stabilized, Cr 1.23 on 6/25  Labs ordered for tomorrow 9.Hypertension. Norvasc 5 mg daily at bedtime  Relatively controlled 6/28 Vitals:   05/24/17 1435 05/25/17 0515  BP: (!) 144/62 132/76  Pulse: 70 67  Resp: 18 18  Temp: 97.8 F (36.6 C) 97.7 F (36.5 C)   10.Atrial fibrillation.Eliquisresumed. Cardiac rate control 11.Coag-negative staph UTI.   Blood cultures NG.   Repeat urine culture NG.   IV rocephin d/ced 12.Type 2 diabetes mellitus. Hemoglobin A1c 6.8. Sliding scale insulin.   Levemir 6 units started on 6/19 (home 16 units)  Relatively controlled 6/28 CBG (last 3)   Recent Labs  05/24/17 1655 05/24/17 2103 05/25/17 0632  GLUCAP 128* 151* 81   13.History of prostate cancer and radiation therapy 14.Hyperlipidemia. Pravachol 15. Hypoalbuminemia  Supplement initiated  16. Leukocytosis: Resolved  WBCs 9.3 on 6/25  Afrebrile  Cont to monitor 17. ABLA  Hb 11.1 on 6/25  Cont to monitor  Labs ordered for tomorrow 18. Sleep disturbance  Trazodone 100qhs changed to seroquel 12.5qhs on 6/26, increased to 25 6/27  Pt needs to be complaint with CPAP  Improving  >35 minutes spent with patient and family in counseling regarding BP, CR, weight loss, sleep, etc.   LOS (Days) 12 A FACE TO FACE EVALUATION WAS PERFORMED  Ynez Eugenio Lorie Phenix 05/25/2017 7:54 AM

## 2017-05-25 NOTE — Progress Notes (Signed)
Social Work Patient ID: William [name], male   DOB: [birth year], 83 y.o.   MRN: [id number]  PT-[name] expressed concerns regarding pt not doing as well in therapy today and requiring more assistance. Have informed MD about this. Wife can not provide hands on care, goals were set at supervision level.

## 2017-05-25 NOTE — Progress Notes (Signed)
Speech Language Pathology Daily Session Note  Patient Details  Name: William [name] MRN: [id number] Date of Birth: [birth year]  Today's Date: 05/25/2017 SLP Individual Time: 1330-1400 SLP Individual Time Calculation (min): 30 min  Short Term Goals: Week 1: SLP Short Term Goal 1 (Week 1): Pt will consume current diet without overt s/s of aspiration and Mod I cues for use of compensatory swallowing strategies.  SLP Short Term Goal 2 (Week 1): Pt will utilize external memory aids to recall new, daily information with Mod A cues.  SLP Short Term Goal 3 (Week 1): Pt will consistently demosntrate O x 4 with Min A cues for use of external aids. SLP Short Term Goal 4 (Week 1): Pt will complete basic familiar tasks iwth Mod A verbal cues for funcitonal problem solving.  SLP Short Term Goal 5 (Week 1): Pt will identify 1 phsyical and 1 cognitive deficit that is related to acute illness with Mod A cues.   Skilled Therapeutic Interventions:  Pt was seen for skilled ST targeting dysphagia goals.  SLP facilitated the session with skilled observations completed during presentations of thin liquids via cup sips.  Pt demonstrated no overt s/s of aspiration with thin liquids via cup sips.  Pt's wife reports limited PO intake during lunch but otherwise good toleration of upgrade.  Pt very lethargic during today's therapy session and kept his eyes closed during conversations with therapist.  As a result, session was ended early due to pt fatigue.  Pt was left in recliner with call bell within reach and wife at bedside.  Continue per current plan of care.       Function:  Eating Eating   Modified Consistency Diet: Yes Eating Assist Level: More than reasonable amount of time;Supervision or verbal cues           Cognition Comprehension Comprehension assist level: Follows basic conversation/direction with no assist  Expression   Expression assist level: Expresses basic needs/ideas: With no assist  Social  Interaction Social Interaction assist level: Interacts appropriately 75 - 89% of the time - Needs redirection for appropriate language or to initiate interaction.  Problem Solving Problem solving assist level: Solves basic 75 - 89% of the time/requires cueing 10 - 24% of the time  Memory Memory assist level: Recognizes or recalls 75 - 89% of the time/requires cueing 10 - 24% of the time    Pain Pain Assessment Pain Assessment: No/denies pain Faces Pain Scale: No hurt  Therapy/Group: Individual Therapy  Becka Lagasse, Selinda Orion 05/25/2017, 2:20 PM

## 2017-05-25 NOTE — Progress Notes (Signed)
05/25/2017- Respiratory care note- Pt agitated with attempted use of cpap. Machine available at bedside.  Pt not wearing this pm.

## 2017-05-25 NOTE — Progress Notes (Signed)
Social Work Elham Fini, Eliezer Champagne Social Worker Signed Physical Medicine and Rehabilitation  Patient Care Conference Date of Service: 05/24/2017  1:36 PM      Hide copied text Hover for attribution information Inpatient RehabilitationTeam Conference and Plan of Care Update Date: 05/24/2017   Time: 8:15 AM      Patient Name: William [name]      Medical Record Number: [id number]  Date of Birth: 1933/12/14 Sex: Male         Room/Bed: 4M01C/4M01C-01 Payor Info: Payor: MEDICARE / Plan: MEDICARE PART A AND B / Product Type: *No Product type* /     Admitting Diagnosis: Uretercal Structures  Admit Date/Time:  05/13/2017  2:01 PM Admission Comments: No comment available    Primary Diagnosis:  <principal problem not specified> Principal Problem: <principal problem not specified>       Patient Active Problem List    Diagnosis Date Noted  . Anxiety state    . Sleep disturbance    . Labile blood glucose    . Debilitated 05/13/2017  . Protein-calorie malnutrition, severe 05/10/2017  . History of prostate cancer    . Hydronephrosis, bilateral    . AKI (acute kidney injury) ([place])    . Diabetes mellitus type 2 in nonobese (HCC)    . Atrial fibrillation ([place])    . Benign essential HTN    . Leukocytosis    . Acute blood loss anemia    . Hydronephrosis    . Acute renal failure (ARF) ([place]) 05/06/2017  . Acute lower UTI 05/06/2017  . Occult GI bleeding 05/06/2017  . Stroke ([place]) 10/26/2016  . Obese 05/07/2014  . Expected blood loss anemia 05/07/2014  . S/P right TKA 05/06/2014  . DJD (degenerative joint disease) of knee 03/25/2014  . Preop cardiovascular exam 03/19/2014  . Abnormal ECG 03/19/2014  . Hypertension    . IDDM (insulin dependent diabetes mellitus) ([place])    . Hypercholesteremia    . Arthritis    . Prostate cancer ([place])    . Sleep apnea        Expected Discharge Date: Expected Discharge Date: 05/27/17   Team Members Present: Physician leading conference: Dr. Delice Lesch Social Worker Present: Ovidio Kin, LCSW Nurse Present: Rayetta Humphrey, RN PT Present: Other (comment) (Ben Zanion & Glenice Bow) OT Present: Willeen Cass, OT SLP Present: Windell Moulding, SLP PPS Coordinator present : Daiva Nakayama, RN, CRRN       Current Status/Progress Goal Weekly Team Focus  Medical     Debilitation secondary to acute renal failure with bilateral hydronephrosis status post stenting 05/09/2017 as well as bilateral nephrostomy tubes 05/10/2017  Improve mobility, sleep  See above   Bowel/Bladder     Continent to bowel.Nephrostomy tubes still in place.LBM 6/27.  Cntinue continent to bowel with min. assisst.Nephrostomy tubes monitoring Q shift with no complications.  To monitor bladder and bowel function Q shift.   Swallow/Nutrition/ Hydration     regular, nectar thick via teaspoon; full supervision   mod I   trials of water per the water protocol, family education   ADL's     supervision for UB ADLs, MinA for LB ADLs and sit<>stand at [facility], supervision for functional mobility with RW  supervision overall  selfcare retratining, dynamic balance, transfer training, neuromuscular re-ed, pt/family education, therapeutic exercise    Mobility     supervision with transfers, mod I bed mobility, ambulating 104 ft with rw  supervision  gait, balance, pt/family edu, D/C planning  Communication               Safety/Cognition/ Behavioral Observations   moderate cognitive impairments   mod assist   orientation, problem solving, memory    Pain     No c/o of pain.  Keep pain levels less than 3.  To monitor pain levels Q 2 hrs. and PRN.   Skin     MASD improving.Nystantin cream schedule.  To keep skin free of pressure sores.  To assess skin Q shift.     *See Care Plan and progress notes for long and short-term goals.   Barriers to Discharge: Safety, mobility, sleep     Possible Resolutions to Barriers:  Therapies, adjusting sleep meds, needs to be complaint with CPAP      Discharge Planning/Teaching Needs:  Wife is here daily and participates in therapies with pt. Concerned aobut BP issues and it dropping, now on nectar thick diet.       Team Discussion:  Goals being met of supervision level. MD following up on BP and creatine back to baseline. Pushing po fluids. On nectar by teaspoon and water protocol. Wife here for education and participation in therapies. Prepare for DC Sat.  Revisions to Treatment Plan:  DC Sat 6/30    Continued Need for Acute Rehabilitation Level of Care: The patient requires daily medical management by a physician with specialized training in physical medicine and rehabilitation for the following conditions: Daily direction of a multidisciplinary physical rehabilitation program to ensure safe treatment while eliciting the highest outcome that is of practical value to the patient.: Yes Daily medical management of patient stability for increased activity during participation in an intensive rehabilitation regime.: Yes Daily analysis of laboratory values and/or radiology reports with any subsequent need for medication adjustment of medical intervention for : Post surgical problems;Renal problems;Other   Daven Pinckney, Gardiner Rhyme 05/25/2017, 12:32 PM      Younique Casad, Gardiner Rhyme, LCSW Social Worker Signed Physical Medicine and Rehabilitation  Patient Care Conference Date of Service: 05/17/2017  3:16 PM      Hide copied text Hover for attribution information Inpatient RehabilitationTeam Conference and Plan of Care Update Date: 05/17/2017 Time: 2:20 PM      Patient Name: William [name]      Medical Record Number: [id number]  Date of Birth: [birth year] Sex: Male         Room/Bed: 4M11C/4M11C-01 Payor Info: Payor: MEDICARE / Plan: MEDICARE PART A AND B / Product Type: *No Product type* /     Admitting Diagnosis: Uretercal Structures  Admit Date/Time:  05/13/2017  2:01 PM Admission Comments: No comment available    Primary Diagnosis:  <principal  problem not specified> Principal Problem: <principal problem not specified>       Patient Active Problem List    Diagnosis Date Noted  . Labile blood glucose    . Debilitated 05/13/2017  . Protein-calorie malnutrition, severe 05/10/2017  . History of prostate cancer    . Hydronephrosis, bilateral    . AKI (acute kidney injury) ([place])    . Diabetes mellitus type 2 in nonobese (HCC)    . Atrial fibrillation ([place])    . Benign essential HTN    . Leukocytosis    . Acute blood loss anemia    . Hydronephrosis    . Acute renal failure (ARF) ([place]) 05/06/2017  . Acute lower UTI 05/06/2017  . Occult GI bleeding 05/06/2017  . Stroke ([place]) 10/26/2016  . Obese 05/07/2014  . Expected blood loss  anemia 05/07/2014  . S/P right TKA 05/06/2014  . DJD (degenerative joint disease) of knee 03/25/2014  . Preop cardiovascular exam 03/19/2014  . Abnormal ECG 03/19/2014  . Hypertension    . IDDM (insulin dependent diabetes mellitus) ([place])    . Hypercholesteremia    . Arthritis    . Prostate cancer ([place])    . Sleep apnea        Expected Discharge Date: Expected Discharge Date: 05/27/17   Team Members Present: Physician leading conference: Dr. Delice Lesch Social Worker Present: Ovidio Kin, LCSW Nurse Present: Rayetta Humphrey, RN PT Present: Other (comment) Althea Grimmer) OT Present: Clyda Greener, OT SLP Present: Weston Anna, SLP PPS Coordinator present : Daiva Nakayama, RN, CRRN       Current Status/Progress Goal Weekly Team Focus  Medical     Debilitation secondary to acute renal failure with bilateral hydronephrosis status post stenting 05/09/2017 as well as bilateral nephrostomy tubes 05/10/2017  Improve mobility, endurance, AKI, leukocytosis  See above   Bowel/Bladder     Continent to bowel with occasional accidents.LBM 6/20.Nephrostomy tubes in place,out put aprox. 500 cc on each side Q 12 hrs.  To continue continent to bowel with min. assisst.To monitor nephrostomy tubes out put closely.   To monitor out put Q shift.   Swallow/Nutrition/ Hydration               ADL's     supervision for UB selfcare, min to mod assist for LB selfcare sit to stand and for functional transfers  supervision overall  selfcare retraining, balance retraining, transfer training, neuromuscular re-education, pt/family education, therapeutic exercise   Mobility     mod/min assist with sit<>stand transfers, needing cues for sequence. Ambulating with min assist X90 ft with rw.   supervision  gait, balance, transfer training, pt/family education, bed mobility   Communication               Safety/Cognition/ Behavioral Observations             Pain     No c/o of pain  To keep pain levels less than 3.  To monitor for pain Q 2-3 hrs.   Skin     MASD on bottom.Gehard cream schedule.  To keep skin free of pressure sores.  To assess skin Q shift.     *See Care Plan and progress notes for long and short-term goals.   Barriers to Discharge: Safety, mobility, AKI, HTN, DM, Leukocytosis     Possible Resolutions to Barriers:  Therapies, follow labs, optimize DM/HTN meds, abx     Discharge Planning/Teaching Needs:  HOme with wife who can provide supervision level only due to her own health issues. She is here daily and participates in therapies with him.      Team Discussion:     Revisions to Treatment Plan:  DC 6/30    Continued Need for Acute Rehabilitation Level of Care: The patient requires daily medical management by a physician with specialized training in physical medicine and rehabilitation for the following conditions: Daily direction of a multidisciplinary physical rehabilitation program to ensure safe treatment while eliciting the highest outcome that is of practical value to the patient.: Yes Daily medical management of patient stability for increased activity during participation in an intensive rehabilitation regime.: Yes Daily analysis of laboratory values and/or radiology reports with any  subsequent need for medication adjustment of medical intervention for : Post surgical problems;Diabetes problems;Blood pressure problems;Renal problems;Other   Elease Hashimoto 05/18/2017, 8:34  AM       Patient ID: COREN SAGAN, male   DOB: [birth year], 83 y.o.   MRN: [id number]

## 2017-05-25 NOTE — Progress Notes (Signed)
Occupational Therapy Session Note  Patient Details  Name: William [name] MRN: [id number] Date of Birth: 1934/05/14  Today's Date: 05/25/2017 OT Individual Time: 1115-1200 OT Individual Time Calculation (min): 45 min    Short Term Goals: Week 1:  OT Short Term Goal 1 (Week 1): STGs=LTGs 2/2 ELOS  Skilled Therapeutic Interventions/Progress Updates:    1:1 Focus on activity tolerance, bed mobility without bed rail, LB dressing on EOB with steadying A occassionally, functional transfers with RW with steadying  To supervision. Transitioned to ADL apartment. Practiced shower stall transfers with RW to chair. Pt able to complete with supervision with wife present to observe. Pt able to ambulate back to his room with close supervision at a slow pace. Pt sat up in recliner in prep for lunch.   Therapy Documentation Precautions:  Precautions Precautions: Fall Precaution Comments: B Nephrostomy tubes Restrictions Weight Bearing Restrictions: No Pain: Pain Assessment Pain Assessment: Faces Faces Pain Scale: No hurt  See Function Navigator for Current Functional Status.   Therapy/Group: Individual Therapy  Rahil, [facility] 05/25/2017, 4:43 PM

## 2017-05-25 NOTE — Progress Notes (Signed)
Modified Barium Swallow Progress Note  Patient Details  Name: William [name] MRN: [id number] Date of Birth: June 18, 1934  Today's Date: 05/25/2017  Modified Barium Swallow completed.  Full report will be located under Chart Review in the Imaging Section once imaging recording system is repaired.  Brief recommendations include the following:  Clinical Impression  Pt continues to present with delay in swallow initiation to the level of the pyriforms with most liquids which at times leads to flash penetration of both thickened and thin liquids.  Pt did sense penetration but also had persistent throat clearing which did not correlate with airway invasion.  Wife reports pt has a prolonged history of persistent throat clearing and coughing both with and without PO intake.  Recommend advancing pt back to thin liquids at this time wtih full supervision for use of small, controlled sips to minimize instances of penetration given known delay in swallow initiation.  Also recommend meds whole in puree.  Wife and pt both educated regarding results of study in pt's room immediately following evaluation.  All questions were answered to their satisfaction at this time.  SLP will continue to follow up for toleration of diet advancement.      Swallow Evaluation Recommendations   Recommended Consults: Consider GI evaluation   SLP Diet Recommendations: Regular solids;Thin liquid   Liquid Administration via: Cup   Medication Administration: Whole meds with puree   Supervision: Patient able to self feed;Full supervision/cueing for compensatory strategies   Compensations: Minimize environmental distractions;Slow rate;Small sips/bites   Postural Changes: Seated upright at 90 degrees            William [name], Elmyra Ricks L 05/25/2017,9:49 AM

## 2017-05-25 NOTE — Progress Notes (Signed)
Occupational Therapy Session Note  Patient Details  Name: William [name] MRN: [id number] Date of Birth: 1934-09-13  Today's Date: 05/25/2017 OT Individual Time: 3614-4315 OT Individual Time Calculation (min): 45 min    Short Term Goals: Week 2:  OT Short Term Goal 1 (Week 2): Continue working on established LTGs set at supervision level overall.    Skilled Therapeutic Interventions/Progress Updates:    Pt presented supine in bed, finishing breakfast and agreeable to OT tx session. Pt completed supine to sitting EOB with increased time and supervision throughout. Pt completed UB/LB dressing sitting EOB, with setup assist and close supervision while standing at RW to pull up shorts/underwear. Pt completed room level functional mobility with RW and close supervision to sink to complete grooming ADLs. Pt alternating between sitting and standing to complete grooming ADLs with close supervision during standing, standing x2 trials during task completion and initiating need for rest breaks. Pt self propelled w/c to therapy apartment for discussion on safe transfer to walk in shower. Did not demonstrate this session as RN arriving with transport for Pt to be taken to [place]. Son present during session and providing education to both Pt and son on LB dressing techniques and techniques for shower transfers. Pt left sitting up in w/c, with transport preparing to escort Pt to study.   Therapy Documentation Precautions:  Precautions Precautions: Fall Precaution Comments: B Nephrostomy tubes Restrictions Weight Bearing Restrictions: No   Pain: Pain Assessment Pain Assessment: Faces Faces Pain Scale: No hurt  See Function Navigator for Current Functional Status.   Therapy/Group: Individual Therapy  Raymondo Band 05/25/2017, 3:46 PM

## 2017-05-25 NOTE — Progress Notes (Signed)
Physical Therapy Session Note  Patient Details  Name: William [name] MRN: [id number] Date of Birth: [birth year]  Today's Date: 05/25/2017 PT Individual Time: 1448-1530 PT Individual Time Calculation (min): 42 min   Short Term Goals: Week 2:  PT Short Term Goal 1 (Week 2): =LTGs due to estimated length of stay  Skilled Therapeutic Interventions/Progress Updates:  Pt received in room, with wife present, & agreeable to tx. Session focused on pt education, functional mobility, and activity tolerance. Wife reports pt is not eating much or sleeping well at night; encouraged pt to increase fluid & food intake, as well as suggested pt wearing CPAP machine at night. Pt's vitals assessed in sitting & standing, please see below. Pt requires min assist to transfer sit<>stand from recliner or w/c and performs dynamic weight shifts forward to assist with sit>stand transfer. Pt ambulated 90 ft + 90 ft with RW & min assist progressing to very close supervision for safety. Pt performed standing hip flexion exercises with BUE support and supervision for strengthening. At end of session pt left sitting in recliner in room with call bell within reach & wife present to supervise.   RN notified of pt's orthostatic hypotension (pt asymptomatic). Also made LSCW aware of downgrade in pt's goals 2/2 inconsistent progress.  Therapy Documentation Precautions:  Precautions Precautions: Fall Precaution Comments: B Nephrostomy tubes Restrictions Weight Bearing Restrictions: No  Vital Signs: Sitting: BP = 132/79 mmHg HR = 70 bpm SpO2 = 100% - room air  Standing:  BP = 95/57 mmHg   Pain: Pt denied c/o pain.  See Function Navigator for Current Functional Status.   Therapy/Group: Individual Therapy  [name] 05/25/2017, 3:51 PM

## 2017-05-25 NOTE — Plan of Care (Signed)
Problem: RH Bed Mobility Goal: LTG Patient will perform bed mobility with assist (PT) LTG: Patient will perform bed mobility with assistance, with/without cues (PT).  Downgrade 2/2 inconsistent progress  Problem: RH Bed to Chair Transfers Goal: LTG Patient will perform bed/chair transfers w/assist (PT) LTG: Patient will perform bed/chair transfers with assistance, with/without cues (PT).  With LRAD; Downgrade 2/2 inconsistent progress  Problem: RH Car Transfers Goal: LTG Patient will perform car transfers with assist (PT) LTG: Patient will perform car transfers with assistance (PT).  With LRAD; downgrade 2/2 inconsistent progress  Problem: RH Ambulation Goal: LTG Patient will ambulate in controlled environment (PT) LTG: Patient will ambulate in a controlled environment, # of feet with assistance (PT).  100 ft with LRAD; downgrade 2/2 inconsistent progress Goal: LTG Patient will ambulate in home environment (PT) LTG: Patient will ambulate in home environment, # of feet with assistance (PT).  61 ft with LRAD; downgrade 2/2 inconsistent progress Goal: LTG Patient will ambulate in community environment (PT) LTG: Patient will ambulate in community environment, # of feet with assistance (PT).  Outcome: Not Applicable Date Met: 47/09/29 D/c 2/2 inconsistent progress  Problem: RH Stairs Goal: LTG Patient will ambulate up and down stairs w/assist (PT) LTG: Patient will ambulate up and down # of stairs with assistance (PT)  1 step + 1 step with LRAD for home access; downgrade 2/2 inconsistent progress

## 2017-05-26 ENCOUNTER — Inpatient Hospital Stay (HOSPITAL_COMMUNITY): Payer: Medicare Other | Admitting: Physical Therapy

## 2017-05-26 ENCOUNTER — Inpatient Hospital Stay (HOSPITAL_COMMUNITY): Payer: Medicare Other | Admitting: Speech Pathology

## 2017-05-26 ENCOUNTER — Inpatient Hospital Stay (HOSPITAL_COMMUNITY): Payer: Medicare Other | Admitting: Occupational Therapy

## 2017-05-26 DIAGNOSIS — T8383XA Hemorrhage of genitourinary prosthetic devices, implants and grafts, initial encounter: Secondary | ICD-10-CM

## 2017-05-26 LAB — BASIC METABOLIC PANEL
ANION GAP: 5 (ref 5–15)
BUN: 13 mg/dL (ref 6–20)
CALCIUM: 8.7 mg/dL — AB (ref 8.9–10.3)
CHLORIDE: 105 mmol/L (ref 101–111)
CO2: 28 mmol/L (ref 22–32)
CREATININE: 1.31 mg/dL — AB (ref 0.61–1.24)
GFR calc non Af Amer: 50 mL/min — ABNORMAL LOW (ref >60)
GFR, EST AFRICAN AMERICAN: 58 mL/min — AB (ref >60)
Glucose, Bld: 84 mg/dL (ref 65–99)
Potassium: 3.8 mmol/L (ref 3.5–5.1)
SODIUM: 138 mmol/L (ref 135–145)

## 2017-05-26 LAB — CBC WITH DIFFERENTIAL/PLATELET
BASOS ABS: 0.1 10*3/uL (ref 0.0–0.1)
BASOS PCT: 1 %
EOS ABS: 0.3 10*3/uL (ref 0.0–0.7)
Eosinophils Relative: 3 %
HCT: 33.2 % — ABNORMAL LOW (ref 39.0–52.0)
HEMOGLOBIN: 10.6 g/dL — AB (ref 13.0–17.0)
Lymphocytes Relative: 13 %
Lymphs Abs: 1.1 10*3/uL (ref 0.7–4.0)
MCH: 29.9 pg (ref 26.0–34.0)
MCHC: 31.9 g/dL (ref 30.0–36.0)
MCV: 93.5 fL (ref 78.0–100.0)
MONOS PCT: 15 %
Monocytes Absolute: 1.3 10*3/uL — ABNORMAL HIGH (ref 0.1–1.0)
NEUTROS ABS: 5.9 10*3/uL (ref 1.7–7.7)
NEUTROS PCT: 68 %
Platelets: 290 10*3/uL (ref 150–400)
RBC: 3.55 MIL/uL — ABNORMAL LOW (ref 4.22–5.81)
RDW: 15.3 % (ref 11.5–15.5)
WBC: 8.6 10*3/uL (ref 4.0–10.5)

## 2017-05-26 LAB — GLUCOSE, CAPILLARY
GLUCOSE-CAPILLARY: 86 mg/dL (ref 65–99)
GLUCOSE-CAPILLARY: 95 mg/dL (ref 65–99)
GLUCOSE-CAPILLARY: 129 mg/dL — AB (ref 65–99)
GLUCOSE-CAPILLARY: 123 mg/dL — AB (ref 65–99)

## 2017-05-26 MED ORDER — LORAZEPAM 0.5 MG PO TABS
1.0000 mg | ORAL_TABLET | Freq: Every day | ORAL | Status: DC
  Administered 2017-05-26: 1 mg via ORAL
  Filled 2017-05-26: qty 2

## 2017-05-26 MED ORDER — LORAZEPAM 1 MG PO TABS: 0.5000 mg | ORAL_TABLET | Freq: Every evening | ORAL | 0 refills | Status: AC | PRN

## 2017-05-26 MED ORDER — NYSTATIN 100000 UNIT/GM EX CREA: TOPICAL_CREAM | Freq: Three times a day (TID) | CUTANEOUS | 0 refills | Status: AC

## 2017-05-26 MED ORDER — LEVEMIR FLEXTOUCH 100 UNIT/ML ~~LOC~~ SOPN: 6.0000 [IU] | PEN_INJECTOR | Freq: Every day | SUBCUTANEOUS | 11 refills | Status: AC

## 2017-05-26 NOTE — Discharge Summary (Signed)
Discharge summary job # [id number]

## 2017-05-26 NOTE — Progress Notes (Signed)
Social Work Patient ID: William [name], male   DOB: 04/07/1934, 82 y.o.   MRN: [id number]  Met with wife to answer her questions regarding supplies for pt's dressing changes and being here when the dressing is changed. Luz-RN aware of this and [facility] to see on Monday and bring supplies.Pt is having a good day today and wife has been through therapies to make sure she can provide the care he will need at home. Preparing for discharge tomorrow.  

## 2017-05-26 NOTE — Progress Notes (Signed)
Placed patient on home CPAP for the night with oxygen set at 2lpm  

## 2017-05-26 NOTE — Discharge Instructions (Signed)
Inpatient Rehab Discharge Instructions  William [name] Discharge date and time: 05/26/17   Activities/Precautions/ Functional Status: Activity: activity as tolerated Diet: Regular consistency with thin liquids--supervision at meals. Small sips for safety.  Wound Care: none needed    Functional status:  ___ No restrictions     ___ Walk up steps independently _X__ 24/7 supervision/assistance   ___ Walk up steps with assistance ___ Intermittent supervision/assistance  ___ Bathe/dress independently ___ Walk with walker     _X__ Bathe/dress with assistance ___ Walk Independently    ___ Shower independently _X__ Walk with assistance    ___ Shower with assistance ___ No alcohol     ___ Return to work/school ________   Special Instructions: 1. No driving 2. Flush nephrostomy tubes 3 times daily with sterile 10 mL normal saline 3. Contact urology for any problems with urostomy tubes or for recurrent bleeding.    COMMUNITY REFERRALS UPON DISCHARGE:    Home Health:   PT, OT, RN, SP, AIDE   Agency:[facility] [phone]   Date of last service:05/27/2017  Medical Equipment/Items [place]  [phone]  GENERAL COMMUNITY RESOURCES FOR PATIENT/FAMILY: Support Groups:[facility] SUPPORT GROUP EVERY SECOND Thursday ( SEPT-MAY) @ 3:00-5;00 PM ON THE REHAB UNIT QUESTIONS CONTACT [place] [phone]  My questions have been answered and I understand these instructions. I will adhere to these goals and the provided educational materials after my discharge from the hospital.  Patient/Caregiver Signature _______________________________ Date __________  Clinician Signature _______________________________________ Date __________  Please bring this form and your medication list with you to all your follow-up doctor's appointments.

## 2017-05-26 NOTE — Discharge Summary (Signed)
NAME:  William [name], PETTET NO.:  [id number]  MEDICAL RECORD NO.:  [id number]  LOCATION:  4M11C                        FACILITY:  [facility]  PHYSICIAN:  Delice Lesch, MD        DATE OF BIRTH:  05-14-1934  DATE OF ADMISSION:  05/13/2017 DATE OF DISCHARGE:  05/27/2017                              DISCHARGE SUMMARY   DISCHARGE DIAGNOSES: 1. Debilitation secondary to acute renal failure with bilateral     hydronephrosis, status post stenting and bilateral nephrostomy     tubes. 2. Chronic Eliquis. 3. Pain management. 4. Acute kidney injury with hypernatremia. 5. Hypertension. 6. Atrial fibrillation. 7. Coagulase-negative Staphylococcus urinary tract infection. 8. Type 2 diabetes mellitus. 9. History of prostate cancer with radiation therapy. 10.Hyperlipidemia. 11.Acute blood loss anemia. 12.Decreased nutritional storage.  HISTORY OF PRESENT ILLNESS:  This is an 83 year old right-handed male, history of diabetes mellitus, hypertension, prostate cancer, CVA in 2017, did not receive inpatient rehab services.  Atrial fibrillation maintained on Eliquis.  He lives with spouse independent prior to admission.  Presented May 06, 2017, with generalized weakness, lethargy, weight loss, recurrent falls, urinary frequency, and nocturia. Creatinine elevated 4.95 from baseline 1.01, BUN 43.  Recent CT revealed left hydronephrosis with dilation of the ureter down to the pelvic brim of the iliac.  Ultrasound revealed moderate left hydronephrosis. Urology consulted, underwent cystoscopy, bilateral retrograde pyelogram, findings of ureteral strictures and underwent stent placement May 09, 2017, per Dr. Junious Silk.  As well as bilateral nephrostomy tubes per Interventional Radiology.  Nephrology consulted for AKI, felt to be mostly obstructive, continue to monitor creatinine 1.57, chronic Eliquis, resumed May 11, 2017.  Coag Staph UTI, completing course of doxycycline.  The patient was  admitted for comprehensive rehab program.  PAST MEDICAL HISTORY:  See discharge diagnoses.  SOCIAL HISTORY:  Lives with wife, independent prior to admission.  FUNCTIONAL STATUS:  Upon admission to rehab services was mod max assist, functional mobility as well as activities of daily living.  PHYSICAL EXAMINATION:  VITAL SIGNS:  Blood pressure 156/71, pulse 80, temperature 98, and respirations 16. GENERAL:  This was an alert male, some delay in processing.  Followed simple commands. EYES:  EOMs intact. NECK:  Supple, nontender.  No JVD. CARDIAC:  Regular rate and rhythm.  No murmur. ABDOMEN:  Soft, nontender.  Good bowel sounds. LUNGS:  Clear to auscultation without wheeze.  REHABILITATION HOSPITAL COURSE:  The patient was admitted to inpatient rehab services with therapies initiated on a 3-hour daily basis, consisting of physical therapy, occupational therapy, speech therapy, and rehabilitation nursing.  The following issues were addressed during the patient's rehabilitation stay.  Pertaining to Mr. [name] debilitation related to acute kidney injury, bilateral hydronephrosis, he had undergone stenting, plans to follow up outpatient with Dr. Junious Silk as well as Dr. Corrie Mckusick of Interventional Radiology, who had placed nephrostomy tubes.  Pain management use of Tylenol.  He continued on Eliquis for chronic atrial fibrillation, cardiac rate controlled.  Kidney function continued to improve, creatinine 1.23, plan to follow up outpatient with Renal Services.  Blood pressures controlled on Norvasc.  He did have a history of type 2 diabetes mellitus. Hemoglobin A1c of 6.8,  he remained on Levemir with diabetic teaching. Pravachol ongoing for hyperlipidemia.  Acute blood loss anemia 10.6 and stable.  The patient received weekly collaborative interdisciplinary team conferences to discuss estimated length of stay, family teaching, any barriers to his discharge.  Sessions focused on energy  conservation. Required minimal assist transfer, sit to stand, ambulating 90 feet, minimal assistance to close supervision, performed standing hip flexion exercises.  He could gather belongings for activities of daily living and homemaking, needing some extra time to complete tasks, completed supine to sitting edge of bed, increased time to supervision throughout, completed upper and lower body dressing edge of bed, set up assist and close supervision.  Full family teaching completed and plan discharge to home.  DISCHARGE MEDICATIONS: 1. Norvasc 5 mg p.o. bedtime. 2. Eliquis 5 mg p.o. b.i.d. 3. Levemir 6 units at bedtime. 4. Ativan 1 mg p.o. at bedtime. 5. Robaxin 500 mg p.o. at bedtime. 6. Multivitamin daily. 7. Pravachol 40 mg daily.  DIET:  His diet was regular.  FOLLOWUP:  The patient would follow up with Dr. Posey Pronto at the outpatient rehab service office as advised; Dr. Junious Silk, call for appointment; Dr. Mauricia Area, Renal Services, call for appointment; Dr. Jani Gravel, medical management 06-27-2017; Dr. Corrie Mckusick, Interventional Radiology, call for appointment.     Lauraine Rinne, P.A.   ______________________________ Delice Lesch, MD    DA/MEDQ  D:  05/26/2017  T:  05/26/2017  Job:  [id number]  cc:   Delice Lesch, MD Arlyss Repress, MD Festus Aloe, MD Joyice Faster. [place], M.D.

## 2017-05-26 NOTE — Progress Notes (Signed)
Social Work  Discharge Note  The overall goal for the admission was met for:   Discharge location: Yes-HOME WITH WIFE AND DAUGHTER COMING FROM [facility] [place] TO HELP WITH TRANSITION HOME  Length of Stay: Yes-14 DAYS  Discharge activity level: Yes-CLOSE SUPERVISION LEVEL-FATIGUES IN THE AFTERNOON'S  Home/community participation: Yes  Services provided included: MD, RD, PT, OT, SLP, RN, CM, Pharmacy, Neuropsych and SW  Financial Services: Medicare and Private Insurance: [employer]  Follow-up services arranged: Home Health: [facility] [employer], DME: [place] and Patient/Family request agency HH: PREF HAD BEFORE, DME: NO PREF  Comments (or additional information):WFIE HAS BEEN HERE DAILY AND PARTICIPATED IN THERAPIES, SHE CAN CUE HIM BUT NOT OFFER PHYSICAL CARE. DAUGHTER COMING IN FROM [facility] TO HELP WITH TRANSITION HOME TOMORROW.   Patient/Family verbalized understanding of follow-up arrangements: Yes  Individual responsible for coordination of the follow-up plan: PATRICIA-WIFE  Confirmed correct DME delivered: Elease Hashimoto 05/26/2017    Elease Hashimoto

## 2017-05-26 NOTE — Progress Notes (Signed)
Physical Therapy Discharge Summary  Patient Details  Name: William [name] MRN: [id number] Date of Birth: [birth year]  Today's Date: 05/26/2017 PT Individual Time:  -  1003 to 11AM    Session1:  1003-1100:  Session focused on performance of functional tasks as in navigator with assistance by wife.  Pt wife educated by therapist on how to appropriately perform car transfer with pt, guard pt for all transfers, and for ambulation.  Pt wife able to demonstrate safe technique guarding pt with all of above.  Following session, pt returned to room up in wheelchair with wife present in room as well.  Session 2: 4010-2725:  Therapy session focused on preparing pt family for D/C tomorrow.  Pt performed stepping up and over 2 curb steps with walker and min assist from wife to prep for home situation.  Additionally performed car transfer with min assist from wife safely.  Pt additionally walked with RW for 57 ft x 1 and approx 30 ft x 1 with RW.  Pt and wife were educated by therapist regarding use of night lights in the house to improve home safety as well as moving slowly during ambulation.  Following session, pt left up in recliner chair with wife in room.  Both pt and wife very receptive to therapist education.   Patient has met 2 of 6 long term goals due to improved activity tolerance, improved balance, increased strength and ability to compensate for deficits.  Patient to discharge at an ambulatory level [place].   Patient's care partner is independent to provide the necessary physical and cognitive assistance at discharge.  Reasons goals not met: Pt has been limited by decreased endurance as well as limitations in cognition limit functional safety for home.  Recommendation:  Patient will benefit from ongoing skilled PT services in home health setting to continue to advance safe functional mobility, address ongoing impairments in functional mobility and safety with mobility, and minimize fall  risk.  Equipment: Receiving wheelchair and bedside commode Daughter has a walker that the patient can use and family already has a shower chair in place.  Reasons for discharge: discharge from hospital  Patient/family agrees with progress made and goals achieved: Yes  PT Discharge Precautions/Restrictions Precautions Precautions: Fall Precaution Comments:  (B nephrostomy tubes) Restrictions Weight Bearing Restrictions: No Vital Signs   Pre: 136/62;  Upon standing: 107/68;  Nursing notified of pt orthostatic systolic BP.  Pt asymptomatic Pain Pain Assessment Pain Assessment: No/denies pain Vision/Perception:  No change apparent since baseline    Cognition Overall Cognitive Status: Impaired/Different from baseline Arousal/Alertness: Awake/alert Orientation Level: Oriented to person;Oriented to place;Oriented to time;Oriented to situation Memory: Impaired Awareness: Impaired Problem Solving: Impaired Reasoning: Impaired Decision Making Impairment: Functional complex Self Correcting Impairment: Functional complex Safety/Judgment: Impaired Sensation Sensation Light Touch: Appears Intact Motor  Motor Motor - Skilled Clinical Observations:  (Avoids standing on R LE )      Trunk/Postural Assessment  Cervical Assessment Cervical Assessment: Exceptions to [facility] Thoracic Assessment Thoracic Assessment: Exceptions to [facility] Thoracic AROM Overall Thoracic AROM Comments: Increased cervical lordosis/thoracic kyphosis Lumbar Assessment Lumbar Assessment: Exceptions to [facility] Lumbar AROM Overall Lumbar AROM Comments: B/L nephrostomy tubes limiting full AROM Postural Control Postural Control: Deficits on evaluation (Limited standing balance)  Balance Balance Balance Assessed: Yes Static Sitting Balance Static Sitting - Balance Support: Bilateral upper extremity supported Dynamic Sitting Balance Dynamic Sitting - Balance Support: Bilateral upper extremity supported Dynamic Sitting  - Level of Assistance: 5: Stand by assistance Dynamic Standing  Balance Dynamic Standing - Balance Support: Bilateral upper extremity supported Dynamic Standing - Level of Assistance: 4: Min assist Extremity Assessment  RUE Assessment RUE Assessment: Exceptions to [facility] RUE Strength RUE Overall Strength Comments: R shoulder flexion/elbow flexion: 4/5; otherwise grossly 5/5 LUE Assessment LUE Assessment: Exceptions to [facility] LUE Strength LUE Overall Strength Comments: L shoulder flexion/elbow flexion: 4/5; otherwise 5/5 RLE Assessment RLE Assessment: Within Functional Limits RLE PROM (degrees) Overall PROM Right Lower Extremity: Within functional limits for tasks assessed RLE Strength RLE Overall Strength: Within Functional Limits for tasks assessed LLE Assessment LLE Assessment: Within Functional Limits LLE PROM (degrees) Overall PROM Left Lower Extremity: Within functional limits for tasks assessed LLE Strength LLE Overall Strength: Within Functional Limits for tasks assessed   See Function Navigator for Current Functional Status.  Jovann Luse Hilario Quarry 05/26/2017, 12:28 PM

## 2017-05-26 NOTE — Progress Notes (Signed)
Occupational Therapy Discharge Summary  Patient Details  Name: William [name] MRN: [id number] Date of Birth: 15-May-1934  Today's Date: 05/26/2017 OT Individual Time: 4332-9518 OT Individual Time Calculation (min): 59 min    OT treatment session completed with focus on ADL self care completion, and functional mobility transfers. Pt completing bed mobility with supervision, initially requires MinA for sit<>stand at [facility] but overall supervision throughout session. Pt completed all room level functional mobility using RW with supervision. Completed toilet and shower transfers using RW, grab bars with supervision. Pt completed toileting tasks, UB/LB bathing with close supervision for safety during standing. Pt completed UB/LB dressing seated in recliner with supervision during standing at RW to pull up shorts/underwear. Requires min verbal cues for dressing techniques, and physical assist only for donning TEDs. Discussed with Pt and spouse safety during ADL completion at home, with spouse and therapist explaining importance of having supervision during bathing completion to increase safety and with Pt verbalizing understanding. All questions answered. Pt left sitting up in recliner, spouse present, call bell and needs within reach.   Patient has met 11 of 11 long term goals due to improved activity tolerance, improved balance, postural control and improved awareness.  Patient to discharge at overall Supervision level.  Patient's care partner (spouse and children) is independent to provide the necessary physical and cognitive assistance at discharge.    Reasons goals not met: NA  Recommendation:  Patient will benefit from ongoing skilled OT services in home health setting to continue to advance functional skills in the area of BADL and Reduce care partner burden.  Equipment: 3:1 BSC  Reasons for discharge: treatment goals met  Patient/family agrees with progress made and goals achieved: Yes  OT  Discharge Precautions/Restrictions  Precautions Precautions: Fall Precaution Comments: bil nephrostomy tubes Restrictions Weight Bearing Restrictions: No General   Vital Signs Therapy Vitals Temp: 97.7 F (36.5 C) Temp Source: Oral Pulse Rate: 66 Resp: 18 BP: (!) 155/74 Patient Position (if appropriate): Sitting Oxygen Therapy SpO2: 100 % O2 Device: Not Delivered Pain Pain Assessment Pain Assessment: Faces Faces Pain Scale: No hurt ADL ADL ADL Comments: see functional navigator  Vision Baseline Vision/History: Wears glasses Wears Glasses: Reading only Patient Visual Report: No change from baseline Vision Assessment?: No apparent visual deficits Perception  Perception: Within Functional Limits Praxis Praxis: Intact Cognition Overall Cognitive Status: Impaired/Different from baseline Arousal/Alertness: Awake/alert Orientation Level: Oriented to person;Oriented to place;Oriented to time;Oriented to situation (increased time ) Memory: Impaired Memory Impairment: Decreased recall of new information Awareness: Impaired Awareness Impairment: Anticipatory impairment;Other (comment) (entering into anticipatory awareness ) Problem Solving: Impaired Problem Solving Impairment: Verbal complex;Functional complex Executive Function: Reasoning;Self Correcting;Organizing;Decision Making Reasoning: Impaired Reasoning Impairment: Functional complex Decision Making: Impaired Decision Making Impairment: Functional complex Self Correcting: Impaired Self Correcting Impairment: Functional complex Safety/Judgment: Impaired Comments: requires min verbal cues  Sensation Sensation Light Touch: Appears Intact Coordination Gross Motor Movements are Fluid and Coordinated: Yes Fine Motor Movements are Fluid and Coordinated:  (WFL for functional task completion ) Motor  Motor Motor: Other (comment) (generalized weakness ) Motor - Skilled Clinical Observations:  (Avoids standing on R  LE ) Motor - Discharge Observations: Pt continues to demosntrate generalized weakness (RUE > LUE) Mobility  Bed Mobility Supine to Sit: 6: Modified independent (Device/Increase time);HOB elevated;With rails Transfers Transfers: Sit to Stand Sit to Stand: 5: Supervision Sit to Stand Details: Verbal cues for technique;Verbal cues for precautions/safety Stand to Sit: 5: Supervision  Trunk/Postural Assessment  Cervical Assessment Cervical Assessment: Exceptions  to [facility] Thoracic Assessment Thoracic Assessment: Exceptions to [facility] Thoracic AROM Overall Thoracic AROM Comments: Increased cervical lordosis/thoracic kyphosis Lumbar Assessment Lumbar Assessment: Exceptions to [facility] Lumbar AROM Overall Lumbar AROM Comments: B/L nephrostomy tubes limiting full AROM Postural Control Postural Control: Deficits on evaluation (able to maintain only for short periods of time )  Balance Balance Balance Assessed: Yes Static Sitting Balance Static Sitting - Balance Support: Feet unsupported (supervision ) Dynamic Sitting Balance Dynamic Sitting - Balance Support: During functional activity;Feet supported Dynamic Sitting - Level of Assistance: 5: Stand by assistance Dynamic Standing Balance Dynamic Standing - Balance Support: Bilateral upper extremity supported Dynamic Standing - Level of Assistance: 5: Stand by assistance Dynamic Standing - Balance Activities: Reaching for objects Dynamic Standing - Comments: during standing grooming ADLs; standing to pull up underwear/pants  Extremity/Trunk Assessment RUE Assessment RUE Assessment: Exceptions to [facility] RUE Strength RUE Overall Strength Comments: generalized weakness  LUE Assessment LUE Assessment: Exceptions to [facility] LUE Strength LUE Overall Strength Comments: generalized weakness    See Function Navigator for Current Functional Status.  Raymondo Band 05/26/2017, 3:36 PM

## 2017-05-26 NOTE — Progress Notes (Signed)
Speech Language Pathology Discharge Summary  Patient Details  Name: William [name] MRN: [id number] Date of Birth: 18-Sep-1934  Today's Date: 05/26/2017 SLP Group Time: 1130-1230 SLP Group Time Calculation (min): 60 min   Skilled Therapeutic Interventions:  Skilled treatment session focused on dysphagia goals. SLP facilitated session by providing skilled observation of pt consuming regular lunch tray with thin liquids within setting of [employer]. Pt consumed without overt s/s of aspiration. Pt was returned to room, left upright in wheelchair with wife present.      Patient has met 8 of 8 long term goals.  Patient to discharge at overall Min;Mod level.   Clinical Impression/Discharge Summary:   Pt has made good progress in skilled ST sessions and as result his swallow function as progressed and he is tolerating regular diet with thin liquids without overt s/s of aspiration. Pt's cognitive abilities have also improved and as a result, he is discharging at Mod A to Min A level of assistance. Extensive education as been provided and all questions answered to wife's satisfaction.   Care Partner:  Caregiver Able to Provide Assistance: Yes  Type of Caregiver Assistance: Physical;Cognitive  Recommendation:  Home Health SLP  Rationale for SLP Follow Up: Maximize functional communication;Maximize cognitive function and independence;Reduce caregiver burden   Equipment: N/A   Reasons for discharge: Treatment goals met   Patient/Family Agrees with Progress Made and Goals Achieved: Yes   Function:  Eating Eating   Modified Consistency Diet: No Eating Assist Level: More than reasonable amount of time;Set up assist for;Supervision or verbal cues   Eating Set Up Assist For: Opening containers;Cutting food       Cognition Comprehension Comprehension assist level: Understands basic 75 - 89% of the time/ requires cueing 10 - 24% of the time  Expression   Expression assist level: Expresses  basic 75 - 89% of the time/requires cueing 10 - 24% of the time. Needs helper to occlude trach/needs to repeat words.  Social Interaction Social Interaction assist level: Interacts appropriately 75 - 89% of the time - Needs redirection for appropriate language or to initiate interaction.  Problem Solving Problem solving assist level: Solves basic 75 - 89% of the time/requires cueing 10 - 24% of the time  Memory Memory assist level: Recognizes or recalls 75 - 89% of the time/requires cueing 10 - 24% of the time    Hien Cunliffe B. Rutherford Nail, M.S., CCC-SLP Speech-Language Pathologist  [name] 05/26/2017, 3:44 PM

## 2017-05-26 NOTE — Progress Notes (Addendum)
[place] PHYSICAL MEDICINE & REHABILITATION     PROGRESS NOTE  Subjective/Complaints:  Pt seen laying in bed this AM.  Caregiver at bedside.  Per pt and nursing, he had some trouble falling asleep, but slept well after that.   ROS: Denies CP, SOB, N/V/D.  Objective: Vital Signs: Blood pressure 128/64, pulse 67, temperature 97.6 F (36.4 C), temperature source Oral, resp. rate 18, height 6' 1.5" (1.867 m), weight 89.7 kg (197 lb 12.8 oz), SpO2 100 %. Dg Swallowing Func-speech Pathology  Result Date: 05/25/2017 Objective Swallowing Evaluation: Type of Study: MBS-Modified Barium Swallow Study Patient Details Name: William [name] MRN: [id number] Date of Birth: 1934-11-20 Today's Date: 05/25/2017 Time: No Data Recorded-No Data Recorded No Data Recorded Past Medical History: Past Medical History: Diagnosis Date . Arthritis  . Diabetes mellitus without complication ([place])  . Hypercholesteremia  . Hypertension  . Kidney stones  . Prostate cancer [facility])   with radiation treatment . Sleep apnea 2/14  "mild per patient" hasnt gotten CPAP machine . Stroke [facility])  Past Surgical History: Past Surgical History: Procedure Laterality Date . ADENOIDECTOMY   . CYSTOSCOPY W/ URETERAL STENT PLACEMENT Bilateral 05/07/2017  Procedure: CYSTOSCOPY WITH BILATERAL RETROGRADE PYELOGRAM CYSTOGRAM BILATERAL URETERAL STENT PLACEMENT;  Surgeon: Festus Aloe, MD;  Location: [facility];  Service: Urology;  Laterality: Bilateral; . FLEXIBLE SIGMOIDOSCOPY N/A 09/12/2013  Procedure: FLEXIBLE SIGMOIDOSCOPY;  Surgeon: Milus Banister, MD;  Location: [facility];  Service: Endoscopy;  Laterality: N/A; . HERNIA REPAIR   . HOT HEMOSTASIS N/A 09/12/2013  Procedure: HOT HEMOSTASIS (ARGON PLASMA COAGULATION/BICAP);  Surgeon: Milus Banister, MD;  Location: [facility];  Service: Endoscopy;  Laterality: N/A; . IR NEPHROSTOMY PLACEMENT LEFT  05/09/2017 . IR NEPHROSTOMY PLACEMENT RIGHT  05/09/2017 . LIPOMA EXCISION   . OTHER SURGICAL HISTORY    s/p  prostate radiation . TONSILLECTOMY   . TOTAL KNEE ARTHROPLASTY Left 03/25/2014  Procedure: LEFT TOTAL KNEE ARTHROPLASTY;  Surgeon: [name], MD;  Location: [facility];  Service: Orthopedics;  Laterality: Left; . TOTAL KNEE ARTHROPLASTY Right 05/06/2014  Procedure: RIGHT TOTAL KNEE ARTHROPLASTY;  Surgeon: [name], MD;  Location: [facility];  Service: Orthopedics;  Laterality: Right; No Data Recorded No Data Recorded Assessment / Plan / Recommendation CHL IP CLINICAL IMPRESSIONS 05/25/2017 Clinical Impression Pt continues to present with delay in swallow initiation to the level of the pyriforms with most liquids which at times leads to flash penetration of both thickened and thin liquids.  Pt did sense penetration but also had persistent throat clearing which did not correlate with airway invasion.  Wife reports pt has a prolonged history of persistent throat clearing and coughing both with and without PO intake.  Recommend advancing pt back to thin liquids at this time wtih full supervision for use of small, controlled sips to minimize instances of penetration given known delay in swallow initiation.  Also recommend meds whole in puree.  Wife and pt both educated regarding results of study in pt's room immediately following evaluation.  All questions were answered to their satisfaction at this time.  SLP will continue to follow up for toleration of diet advancement.   SLP Visit Diagnosis Dysphagia, oropharyngeal phase (R13.12) \    Impact on safety and function Mild aspiration risk   No flowsheet data found.  No flowsheet data found. CHL IP DIET RECOMMENDATION 05/25/2017 SLP Diet Recommendations Regular solids;Thin liquid Liquid Administration via Cup Medication Administration Whole meds with puree Compensations Minimize environmental distractions;Slow rate;Small sips/bites Postural Changes  Seated upright at 90 degrees   CHL IP OTHER RECOMMENDATIONS 05/25/2017 Recommended Consults Consider GI evaluation Oral Care  Recommendations -- Other Recommendations --     No flowsheet data found.     CHL IP ORAL PHASE 05/25/2017 Oral Phase Impaired Oral - Pudding Teaspoon -- Oral - Pudding Cup -- Oral - Honey Teaspoon -- Oral - Honey Cup -- Oral - Nectar Teaspoon WFL Oral - Nectar Cup Premature spillage Oral - Nectar Straw -- Oral - Thin Teaspoon Premature spillage Oral - Thin Cup Premature spillage Oral - Thin Straw Premature spillage Oral - Puree -- Oral - Mech Soft -- Oral - Regular -- Oral - Multi-Consistency -- Oral - Pill Premature spillage Oral Phase - Comment --  CHL IP PHARYNGEAL PHASE 05/25/2017 Pharyngeal Phase Impaired Pharyngeal- Pudding Teaspoon -- Pharyngeal -- Pharyngeal- Pudding Cup -- Pharyngeal -- Pharyngeal- Honey Teaspoon -- Pharyngeal -- Pharyngeal- Honey Cup -- Pharyngeal -- Pharyngeal- Nectar Teaspoon WFL Pharyngeal -- Pharyngeal- Nectar Cup Delayed swallow initiation-pyriform sinuses Pharyngeal Material enters airway, remains ABOVE vocal cords then ejected out Pharyngeal- Nectar Straw -- Pharyngeal -- Pharyngeal- Thin Teaspoon Delayed swallow initiation-pyriform sinuses Pharyngeal -- Pharyngeal- Thin Cup Delayed swallow initiation-pyriform sinuses;Penetration/Aspiration during swallow Pharyngeal Material enters airway, remains ABOVE vocal cords then ejected out Pharyngeal- Thin Straw Penetration/Aspiration during swallow Pharyngeal Material enters airway, remains ABOVE vocal cords then ejected out Pharyngeal- Puree -- Pharyngeal -- Pharyngeal- Mechanical Soft -- Pharyngeal -- Pharyngeal- Regular -- Pharyngeal -- Pharyngeal- Multi-consistency -- Pharyngeal -- Pharyngeal- Pill Delayed swallow initiation-pyriform sinuses;Pharyngeal residue - valleculae Pharyngeal -- Pharyngeal Comment --  CHL IP CERVICAL ESOPHAGEAL PHASE 05/19/2017 Cervical Esophageal Phase Impaired Pudding Teaspoon -- Pudding Cup -- Honey Teaspoon -- Honey Cup -- Nectar Teaspoon -- Nectar Cup -- Nectar Straw -- Thin Teaspoon -- Thin Cup -- Thin Straw  -- Puree -- Mechanical Soft -- Regular -- Multi-consistency -- Pill -- Cervical Esophageal Comment -- Emilio Math 05/25/2017, 10:16 PM               Recent Labs  05/26/17 0531  WBC 8.6  HGB 10.6*  HCT 33.2*  PLT 290   No results for input(s): NA, K, CL, GLUCOSE, BUN, CREATININE, CALCIUM in the last 72 hours.  Invalid input(s): CO CBG (last 3)   Recent Labs  05/25/17 1647 05/25/17 2118 05/26/17 0608  GLUCAP 134* 137* 86    Wt Readings from Last 3 Encounters:  05/26/17 89.7 kg (197 lb 12.8 oz)  05/12/17 92.5 kg (204 lb)  01/05/17 104.3 kg (230 lb)    Physical Exam:  BP 128/64 (BP Location: Right Arm)   Pulse 67   Temp 97.6 F (36.4 C) (Oral)   Resp 18   Ht 6' 1.5" (1.867 m)   Wt 89.7 kg (197 lb 12.8 oz)   SpO2 100%   BMI 25.74 kg/m  Constitutional: He appears well-developed. Vitalsreviewed. NAD. HENT: Normocephalic. Atraumatic Eyes: EOMare normal. No discharge.  Cardiovascular: IRIR Respiratory: Effort normal and breath sounds normal.  GI: Soft. Bowel sounds are normal.  Neurological: He is alert.  Delay in processing.  HOH Motor: Grossly 5/5 b/l UE B/l LE 4+/5 RLE, 4+/5 LLE  (stable) Skin. Warm and dry. Intact.  Assessment/Plan: 1. Functional deficits secondary to bilateral hydronephrosis status post stenting 05/09/2017 as well as bilateral nephrostomy tubes which require 3+ hours per day of interdisciplinary therapy in a comprehensive inpatient rehab setting. Physiatrist is providing close team supervision and 24 hour management of active medical problems listed below. Physiatrist and  rehab team continue to assess barriers to discharge/monitor patient progress toward functional and medical goals.  Function:  Bathing Bathing position   Position: Shower  Bathing parts Body parts bathed by patient: Right arm, Left arm, Chest, Abdomen, Right upper leg, Left upper leg, Right lower leg, Left lower leg, Front perineal area, Buttocks Body parts bathed by  helper: Back  Bathing assist Assist Level: Touching or steadying assistance(Pt > 75%)      Upper Body Dressing/Undressing Upper body dressing   What is the patient wearing?: Pull over shirt/dress     Pull over shirt/dress - Perfomed by patient: Thread/unthread left sleeve, Put head through opening, Pull shirt over trunk, Thread/unthread right sleeve          Upper body assist Assist Level: Supervision or verbal cues      Lower Body Dressing/Undressing Lower body dressing   What is the patient wearing?: Pants, Underwear, Non-skid slipper socks, Ted Hose Underwear - Performed by patient: Thread/unthread right underwear leg, Thread/unthread left underwear leg, Pull underwear up/down   Pants- Performed by patient: Thread/unthread right pants leg, Thread/unthread left pants leg, Pull pants up/down   Non-skid slipper socks- Performed by patient: Don/doff right sock, Don/doff left sock Non-skid slipper socks- Performed by helper: Don/doff right sock, Don/doff left sock Socks - Performed by patient: Don/doff right sock, Don/doff left sock   Shoes - Performed by patient: Don/doff right shoe, Don/doff left shoe, Fasten right, Fasten left         TED Hose - Performed by helper: Don/doff right TED hose, Don/doff left TED hose  Lower body assist Assist for lower body dressing: Supervision or verbal cues      Toileting Toileting Toileting activity did not occur: No continent bowel/bladder event Toileting steps completed by patient: Adjust clothing prior to toileting, Performs perineal hygiene Toileting steps completed by helper: Adjust clothing after toileting Toileting Assistive Devices: Grab bar or rail  Toileting assist Assist level: Supervision or verbal cues   Transfers Chair/bed transfer   Chair/bed transfer method: Stand pivot Chair/bed transfer assist level: Supervision or verbal cues Chair/bed transfer assistive device: Armrests, Medical sales representative      Max distance: 90 ft Assist level: Touching or steadying assistance (Pt > 75%)   Wheelchair   Type: Manual Max wheelchair distance: 100 ft Assist Level: Supervision or verbal cues  Cognition Comprehension Comprehension assist level: Follows basic conversation/direction with no assist  Expression Expression assist level: Expresses basic needs/ideas: With no assist  Social Interaction Social Interaction assist level: Interacts appropriately 75 - 89% of the time - Needs redirection for appropriate language or to initiate interaction.  Problem Solving Problem solving assist level: Solves basic 75 - 89% of the time/requires cueing 10 - 24% of the time  Memory Memory assist level: Recognizes or recalls 75 - 89% of the time/requires cueing 10 - 24% of the time    Medical Problem List and Plan: 1. Debilitation secondary to acute renal failure with bilateral hydronephrosis status post stenting 05/09/2017 as well as bilateral nephrostomy tubes 05/10/2017  Cont CIR  Follow up tubes as outpt 2. DVT Prophylaxis/Anticoagulation:Chronic Eliquis 3. Pain Management: Tylenol 4. Mood/delirium: Trazodone 50 mg daily at bedtime, Ativan 1 mg as needed anxiety/sleep 5. Neuropsych: This patient iscapable of making decisions on hisown behalf. 6. Skin/Wound Care: Routine skin checks 7. Fluids/Electrolytes/Nutrition: Routine I&Os  Diet liberalized per daughter request  On regular nectar thick diet 8.AKI/hypernatremiafelt to be secondary to obstruction.  Cont to monitor  Appears to have stabilized, Cr 1.23 on 6/25  Labs pending 9.Hypertension. Norvasc 5 mg daily at bedtime  Relatively controlled 6/28 Vitals:   05/25/17 1344 05/26/17 0501  BP: 130/68 128/64  Pulse: 67 67  Resp: 18 18  Temp: 98 F (36.7 C) 97.6 F (36.4 C)   10.Atrial fibrillation.Eliquisresumed. Cardiac rate control 11.Coag-negative staph UTI.   Blood cultures NG.   Repeat urine culture NG.   IV rocephin d/ced 12.Type  2 diabetes mellitus. Hemoglobin A1c 6.8. Sliding scale insulin.   Levemir 6 units started on 6/19 (home 16 units)  Relatively controlled 6/29 CBG (last 3)   Recent Labs  05/25/17 1647 05/25/17 2118 05/26/17 0608  GLUCAP 134* 137* 86   13.History of prostate cancer and radiation therapy 14.Hyperlipidemia. Pravachol 15. Hypoalbuminemia  Supplement initiated  16. Leukocytosis: Resolved  WBCs 8.6 on 6/29  Afrebrile  Cont to monitor 17. ABLA  Hb 10.6 on 6/29  Will consult Urology due to blood from meatus and in bag and downward trend  Cont to monitor 18. Sleep disturbance  Trazodone 100qhs changed to seroquel 12.5qhs on 6/26, increased to 25 6/27, will d/c  Will add scheduled ativan with prn dose as this appears to work for the patient  Pt needs to be complaint with CPAP  Improving 19. Weight loss Filed Weights   05/24/17 0258 05/25/17 0515 05/26/17 0501  Weight: 88.9 kg (195 lb 15.8 oz) 88 kg (193 lb 14.4 oz) 89.7 kg (197 lb 12.8 oz)   Stable in rehab, and relatively stable since admission to hospital. Per family months of weight loss, may need workup as outpt   LOS (Days) 13 A FACE TO FACE EVALUATION WAS PERFORMED  Zaryia Markel Lorie Phenix 05/26/2017 7:48 AM

## 2017-05-26 NOTE — Progress Notes (Signed)
Dr. Junious Silk called back for input regarding bleeding from right nephrostomy tube. He reported that this was not unusual as patient has bladder stents and is on Eliquis. He recommended monitoring H/H as no frank bleeding. Will recheck H/H in am and patient to follow up with GU in next 1-2 weeks. Family updated.

## 2017-05-27 DIAGNOSIS — N183 Chronic kidney disease, stage 3 unspecified: Secondary | ICD-10-CM

## 2017-05-27 LAB — GLUCOSE, CAPILLARY: Glucose-Capillary: 71 mg/dL (ref 65–99)

## 2017-05-27 LAB — CBC
HEMATOCRIT: 33.5 % — AB (ref 39.0–52.0)
Hemoglobin: 10.8 g/dL — ABNORMAL LOW (ref 13.0–17.0)
MCH: 30.7 pg (ref 26.0–34.0)
MCHC: 32.2 g/dL (ref 30.0–36.0)
MCV: 95.2 fL (ref 78.0–100.0)
PLATELETS: 279 10*3/uL (ref 150–400)
RBC: 3.52 MIL/uL — AB (ref 4.22–5.81)
RDW: 15.6 % — ABNORMAL HIGH (ref 11.5–15.5)
WBC: 8.8 10*3/uL (ref 4.0–10.5)

## 2017-05-27 NOTE — Progress Notes (Signed)
Discharged home per w/c with possesions. Wife and daughter at side. No c/o distress

## 2017-05-27 NOTE — Progress Notes (Signed)
[place] PHYSICAL MEDICINE & REHABILITATION     PROGRESS NOTE  Subjective/Complaints:  Pt seen laying in bed this AM.  He was restless overnight.  He is looking forward to going home, however.   ROS: Denies CP, SOB, N/V/D.  Objective: Vital Signs: Blood pressure (!) 144/77, pulse 70, temperature 98.3 F (36.8 C), temperature source Oral, resp. rate 18, height 6' 1.5" (1.867 m), weight 89.7 kg (197 lb 12.8 oz), SpO2 98 %. Dg Swallowing Func-speech Pathology  Result Date: 05/25/2017 Objective Swallowing Evaluation: Type of Study: MBS-Modified Barium Swallow Study Patient Details Name: William [name] MRN: [id number] Date of Birth: July 21, 1934 Today's Date: 05/25/2017 Time: No Data Recorded-No Data Recorded No Data Recorded Past Medical History: Past Medical History: Diagnosis Date . Arthritis  . Diabetes mellitus without complication ([place])  . Hypercholesteremia  . Hypertension  . Kidney stones  . Prostate cancer [facility])   with radiation treatment . Sleep apnea 2/14  "mild per patient" hasnt gotten CPAP machine . Stroke [facility])  Past Surgical History: Past Surgical History: Procedure Laterality Date . ADENOIDECTOMY   . CYSTOSCOPY W/ URETERAL STENT PLACEMENT Bilateral 05/07/2017  Procedure: CYSTOSCOPY WITH BILATERAL RETROGRADE PYELOGRAM CYSTOGRAM BILATERAL URETERAL STENT PLACEMENT;  Surgeon: Festus Aloe, MD;  Location: [facility];  Service: Urology;  Laterality: Bilateral; . FLEXIBLE SIGMOIDOSCOPY N/A 09/12/2013  Procedure: FLEXIBLE SIGMOIDOSCOPY;  Surgeon: Milus Banister, MD;  Location: [facility];  Service: Endoscopy;  Laterality: N/A; . HERNIA REPAIR   . HOT HEMOSTASIS N/A 09/12/2013  Procedure: HOT HEMOSTASIS (ARGON PLASMA COAGULATION/BICAP);  Surgeon: Milus Banister, MD;  Location: [facility];  Service: Endoscopy;  Laterality: N/A; . IR NEPHROSTOMY PLACEMENT LEFT  05/09/2017 . IR NEPHROSTOMY PLACEMENT RIGHT  05/09/2017 . LIPOMA EXCISION   . OTHER SURGICAL HISTORY    s/p prostate radiation . TONSILLECTOMY    . TOTAL KNEE ARTHROPLASTY Left 03/25/2014  Procedure: LEFT TOTAL KNEE ARTHROPLASTY;  Surgeon: [name], MD;  Location: [facility];  Service: Orthopedics;  Laterality: Left; . TOTAL KNEE ARTHROPLASTY Right 05/06/2014  Procedure: RIGHT TOTAL KNEE ARTHROPLASTY;  Surgeon: [name], MD;  Location: [facility];  Service: Orthopedics;  Laterality: Right; No Data Recorded No Data Recorded Assessment / Plan / Recommendation CHL IP CLINICAL IMPRESSIONS 05/25/2017 Clinical Impression Pt continues to present with delay in swallow initiation to the level of the pyriforms with most liquids which at times leads to flash penetration of both thickened and thin liquids.  Pt did sense penetration but also had persistent throat clearing which did not correlate with airway invasion.  Wife reports pt has a prolonged history of persistent throat clearing and coughing both with and without PO intake.  Recommend advancing pt back to thin liquids at this time wtih full supervision for use of small, controlled sips to minimize instances of penetration given known delay in swallow initiation.  Also recommend meds whole in puree.  Wife and pt both educated regarding results of study in pt's room immediately following evaluation.  All questions were answered to their satisfaction at this time.  SLP will continue to follow up for toleration of diet advancement.   SLP Visit Diagnosis Dysphagia, oropharyngeal phase (R13.12) \    Impact on safety and function Mild aspiration risk   No flowsheet data found.  No flowsheet data found. CHL IP DIET RECOMMENDATION 05/25/2017 SLP Diet Recommendations Regular solids;Thin liquid Liquid Administration via Cup Medication Administration Whole meds with puree Compensations Minimize environmental distractions;Slow rate;Small sips/bites Postural Changes Seated upright at 90 degrees  CHL IP OTHER RECOMMENDATIONS 05/25/2017 Recommended Consults Consider GI evaluation Oral Care Recommendations -- Other Recommendations  --     No flowsheet data found.     CHL IP ORAL PHASE 05/25/2017 Oral Phase Impaired Oral - Pudding Teaspoon -- Oral - Pudding Cup -- Oral - Honey Teaspoon -- Oral - Honey Cup -- Oral - Nectar Teaspoon WFL Oral - Nectar Cup Premature spillage Oral - Nectar Straw -- Oral - Thin Teaspoon Premature spillage Oral - Thin Cup Premature spillage Oral - Thin Straw Premature spillage Oral - Puree -- Oral - Mech Soft -- Oral - Regular -- Oral - Multi-Consistency -- Oral - Pill Premature spillage Oral Phase - Comment --  CHL IP PHARYNGEAL PHASE 05/25/2017 Pharyngeal Phase Impaired Pharyngeal- Pudding Teaspoon -- Pharyngeal -- Pharyngeal- Pudding Cup -- Pharyngeal -- Pharyngeal- Honey Teaspoon -- Pharyngeal -- Pharyngeal- Honey Cup -- Pharyngeal -- Pharyngeal- Nectar Teaspoon WFL Pharyngeal -- Pharyngeal- Nectar Cup Delayed swallow initiation-pyriform sinuses Pharyngeal Material enters airway, remains ABOVE vocal cords then ejected out Pharyngeal- Nectar Straw -- Pharyngeal -- Pharyngeal- Thin Teaspoon Delayed swallow initiation-pyriform sinuses Pharyngeal -- Pharyngeal- Thin Cup Delayed swallow initiation-pyriform sinuses;Penetration/Aspiration during swallow Pharyngeal Material enters airway, remains ABOVE vocal cords then ejected out Pharyngeal- Thin Straw Penetration/Aspiration during swallow Pharyngeal Material enters airway, remains ABOVE vocal cords then ejected out Pharyngeal- Puree -- Pharyngeal -- Pharyngeal- Mechanical Soft -- Pharyngeal -- Pharyngeal- Regular -- Pharyngeal -- Pharyngeal- Multi-consistency -- Pharyngeal -- Pharyngeal- Pill Delayed swallow initiation-pyriform sinuses;Pharyngeal residue - valleculae Pharyngeal -- Pharyngeal Comment --  CHL IP CERVICAL ESOPHAGEAL PHASE 05/19/2017 Cervical Esophageal Phase Impaired Pudding Teaspoon -- Pudding Cup -- Honey Teaspoon -- Honey Cup -- Nectar Teaspoon -- Nectar Cup -- Nectar Straw -- Thin Teaspoon -- Thin Cup -- Thin Straw -- Puree -- Mechanical Soft -- Regular  -- Multi-consistency -- Pill -- Cervical Esophageal Comment -- William [name] 05/25/2017, 10:16 PM               Recent Labs  05/26/17 0531 05/27/17 0428  WBC 8.6 8.8  HGB 10.6* 10.8*  HCT 33.2* 33.5*  PLT 290 279    Recent Labs  05/26/17 0531  NA 138  K 3.8  CL 105  GLUCOSE 84  BUN 13  CREATININE 1.31*  CALCIUM 8.7*   CBG (last 3)   Recent Labs  05/26/17 1620 05/26/17 2140 05/27/17 0644  GLUCAP 129* 123* 71    Wt Readings from Last 3 Encounters:  05/26/17 89.7 kg (197 lb 12.8 oz)  05/12/17 92.5 kg (204 lb)  01/05/17 104.3 kg (230 lb)    Physical Exam:  BP (!) 144/77 (BP Location: Right Arm)   Pulse 70   Temp 98.3 F (36.8 C) (Oral)   Resp 18   Ht 6' 1.5" (1.867 m)   Wt 89.7 kg (197 lb 12.8 oz)   SpO2 98%   BMI 25.74 kg/m  Constitutional: He appears well-developed. Vitalsreviewed. NAD. HENT: Normocephalic. Atraumatic Eyes: EOMare normal. No discharge.  Cardiovascular: IRIR Respiratory: Effort normal and breath sounds normal.  GI: Soft. Bowel sounds are normal.  Neurological: He is alert.  Delay in processing.  HOH Motor: Grossly 5/5 b/l UE B/l LE 4+/5 RLE, 4+/5 LLE (unchanged) Skin. Warm and dry. Intact.  Assessment/Plan: 1. Functional deficits secondary to bilateral hydronephrosis status post stenting 05/09/2017 as well as bilateral nephrostomy tubes which require 3+ hours per day of interdisciplinary therapy in a comprehensive inpatient rehab setting. Physiatrist is providing close team supervision and 24 hour management  of active medical problems listed below. Physiatrist and rehab team continue to assess barriers to discharge/monitor patient progress toward functional and medical goals.  Function:  Bathing Bathing position   Position: Shower  Bathing parts Body parts bathed by patient: Right arm, Left arm, Chest, Abdomen, Right upper leg, Left upper leg, Right lower leg, Left lower leg, Front perineal area Body parts bathed by helper:  Buttocks, Back  Bathing assist Assist Level: Touching or steadying assistance(Pt > 75%)      Upper Body Dressing/Undressing Upper body dressing   What is the patient wearing?: Pull over shirt/dress     Pull over shirt/dress - Perfomed by patient: Thread/unthread left sleeve, Put head through opening, Pull shirt over trunk, Thread/unthread right sleeve          Upper body assist Assist Level: Set up   Set up : To obtain clothing/put away  Lower Body Dressing/Undressing Lower body dressing   What is the patient wearing?: Pants, Underwear, Non-skid slipper socks, Ted Hose Underwear - Performed by patient: Thread/unthread right underwear leg, Thread/unthread left underwear leg, Pull underwear up/down   Pants- Performed by patient: Thread/unthread right pants leg, Thread/unthread left pants leg, Pull pants up/down   Non-skid slipper socks- Performed by patient: Don/doff right sock, Don/doff left sock Non-skid slipper socks- Performed by helper: Don/doff right sock, Don/doff left sock Socks - Performed by patient: Don/doff right sock, Don/doff left sock   Shoes - Performed by patient: Don/doff right shoe, Don/doff left shoe, Fasten right, Fasten left         TED Hose - Performed by helper: Don/doff right TED hose, Don/doff left TED hose  Lower body assist Assist for lower body dressing: Supervision or verbal cues      Toileting Toileting Toileting activity did not occur: No continent bowel/bladder event Toileting steps completed by patient: Performs perineal hygiene Toileting steps completed by helper: Adjust clothing after toileting Toileting Assistive Devices: Grab bar or rail  Toileting assist Assist level: Supervision or verbal cues   Transfers Chair/bed transfer   Chair/bed transfer method: Ambulatory Chair/bed transfer assist level: Supervision or verbal cues Chair/bed transfer assistive device: Environmental consultant, Air cabin crew     Max distance:  (57  ft) Assist level: Touching or steadying assistance (Pt > 75%)   Wheelchair   Type: Manual Max wheelchair distance:  (150 ft) Assist Level: Supervision or verbal cues  Cognition Comprehension Comprehension assist level: Understands basic 75 - 89% of the time/ requires cueing 10 - 24% of the time  Expression Expression assist level: Expresses basic 75 - 89% of the time/requires cueing 10 - 24% of the time. Needs helper to occlude trach/needs to repeat words.  Social Interaction Social Interaction assist level: Interacts appropriately 75 - 89% of the time - Needs redirection for appropriate language or to initiate interaction.  Problem Solving Problem solving assist level: Solves basic 75 - 89% of the time/requires cueing 10 - 24% of the time  Memory Memory assist level: Recognizes or recalls 75 - 89% of the time/requires cueing 10 - 24% of the time    Medical Problem List and Plan: 1. Debilitation secondary to acute renal failure with bilateral hydronephrosis status post stenting 05/09/2017 as well as bilateral nephrostomy tubes 05/10/2017  D/c today.   Will see patient for transitional care management in 1-2 weeks.  Follow up tubes as outpt 2. DVT Prophylaxis/Anticoagulation:Chronic Eliquis 3. Pain Management: Tylenol 4. Mood/delirium: Trazodone 50 mg daily at bedtime, Ativan 1 mg  as needed anxiety/sleep 5. Neuropsych: This patient iscapable of making decisions on hisown behalf. 6. Skin/Wound Care: Routine skin checks 7. Fluids/Electrolytes/Nutrition: Routine I&Os  Diet liberalized per daughter request  On regular nectar thick diet 8.AKI/hypernatremiafelt to be secondary to obstruction.  Cont to monitor  Appears to have stabilized, Cr 1.31 on 6/29 9.Hypertension. Norvasc 5 mg daily at bedtime  Relatively controlled 6/30 Vitals:   05/26/17 2234 05/27/17 0701  BP:  (!) 144/77  Pulse: 70 70  Resp: 15 18  Temp:  98.3 F (36.8 C)   10.Atrial fibrillation.Eliquisresumed.  Cardiac rate control 11.Coag-negative staph UTI.   Blood cultures NG.   Repeat urine culture NG.   IV rocephin d/ced 12.Type 2 diabetes mellitus. Hemoglobin A1c 6.8. Sliding scale insulin.   Levemir 6 units started on 6/19 (home 16 units)  Relatively controlled 6/30 CBG (last 3)   Recent Labs  05/26/17 1620 05/26/17 2140 05/27/17 0644  GLUCAP 129* 123* 71   13.History of prostate cancer and radiation therapy 14.Hyperlipidemia. Pravachol 15. Hypoalbuminemia  Supplement initiated  16. Leukocytosis: Resolved  Afrebrile  Cont to monitor 17. ABLA  Hb 10.8 on 6/30  Expected bleeding from nephrostomy per Urology  Cont to monitor 18. Sleep disturbance  Trazodone 100qhs changed to seroquel 12.5qhs on 6/26, increased to 25 6/27, will d/c  Added scheduled ativan with prn dose as this appears to work for the patient  Pt needs to be complaint with CPAP  Improving 19. Weight loss Filed Weights   05/24/17 9147 05/25/17 0515 05/26/17 0501  Weight: 88.9 kg (195 lb 15.8 oz) 88 kg (193 lb 14.4 oz) 89.7 kg (197 lb 12.8 oz)   Stable in rehab, and relatively stable since admission to hospital. Per family months of weight loss, may need workup as outpt   LOS (Days) 14 A FACE TO FACE EVALUATION WAS PERFORMED  William [name] 05/27/2017 7:27 AM

## 2017-05-28 DIAGNOSIS — E1122 Type 2 diabetes mellitus with diabetic chronic kidney disease: Secondary | ICD-10-CM | POA: Diagnosis not present

## 2017-05-28 DIAGNOSIS — D631 Anemia in chronic kidney disease: Secondary | ICD-10-CM | POA: Diagnosis not present

## 2017-05-28 DIAGNOSIS — I129 Hypertensive chronic kidney disease with stage 1 through stage 4 chronic kidney disease, or unspecified chronic kidney disease: Secondary | ICD-10-CM | POA: Diagnosis not present

## 2017-05-28 DIAGNOSIS — C61 Malignant neoplasm of prostate: Secondary | ICD-10-CM | POA: Diagnosis not present

## 2017-05-28 DIAGNOSIS — Z436 Encounter for attention to other artificial openings of urinary tract: Secondary | ICD-10-CM | POA: Diagnosis not present

## 2017-05-28 DIAGNOSIS — N183 Chronic kidney disease, stage 3 (moderate): Secondary | ICD-10-CM | POA: Diagnosis not present

## 2017-05-29 DIAGNOSIS — Z436 Encounter for attention to other artificial openings of urinary tract: Secondary | ICD-10-CM | POA: Diagnosis not present

## 2017-05-29 DIAGNOSIS — E1122 Type 2 diabetes mellitus with diabetic chronic kidney disease: Secondary | ICD-10-CM | POA: Diagnosis not present

## 2017-05-29 DIAGNOSIS — I129 Hypertensive chronic kidney disease with stage 1 through stage 4 chronic kidney disease, or unspecified chronic kidney disease: Secondary | ICD-10-CM | POA: Diagnosis not present

## 2017-05-29 DIAGNOSIS — C61 Malignant neoplasm of prostate: Secondary | ICD-10-CM | POA: Diagnosis not present

## 2017-05-29 DIAGNOSIS — D631 Anemia in chronic kidney disease: Secondary | ICD-10-CM | POA: Diagnosis not present

## 2017-05-29 DIAGNOSIS — N183 Chronic kidney disease, stage 3 (moderate): Secondary | ICD-10-CM | POA: Diagnosis not present

## 2017-05-30 ENCOUNTER — Encounter (HOSPITAL_COMMUNITY): Payer: Self-pay

## 2017-05-30 ENCOUNTER — Ambulatory Visit: Payer: Self-pay | Admitting: Neurology

## 2017-05-30 ENCOUNTER — Inpatient Hospital Stay (HOSPITAL_COMMUNITY)
Admission: EM | Admit: 2017-05-30 | Discharge: 2017-06-28 | DRG: 189 | Disposition: E | Payer: Medicare Other | Attending: Internal Medicine | Admitting: Internal Medicine

## 2017-05-30 ENCOUNTER — Emergency Department (HOSPITAL_COMMUNITY): Payer: Medicare Other

## 2017-05-30 DIAGNOSIS — I483 Typical atrial flutter: Secondary | ICD-10-CM | POA: Diagnosis not present

## 2017-05-30 DIAGNOSIS — M545 Low back pain, unspecified: Secondary | ICD-10-CM

## 2017-05-30 DIAGNOSIS — B9689 Other specified bacterial agents as the cause of diseases classified elsewhere: Secondary | ICD-10-CM | POA: Diagnosis present

## 2017-05-30 DIAGNOSIS — I130 Hypertensive heart and chronic kidney disease with heart failure and stage 1 through stage 4 chronic kidney disease, or unspecified chronic kidney disease: Secondary | ICD-10-CM | POA: Diagnosis not present

## 2017-05-30 DIAGNOSIS — R52 Pain, unspecified: Secondary | ICD-10-CM

## 2017-05-30 DIAGNOSIS — Z96653 Presence of artificial knee joint, bilateral: Secondary | ICD-10-CM | POA: Diagnosis present

## 2017-05-30 DIAGNOSIS — I5030 Unspecified diastolic (congestive) heart failure: Secondary | ICD-10-CM | POA: Diagnosis present

## 2017-05-30 DIAGNOSIS — I129 Hypertensive chronic kidney disease with stage 1 through stage 4 chronic kidney disease, or unspecified chronic kidney disease: Secondary | ICD-10-CM | POA: Diagnosis not present

## 2017-05-30 DIAGNOSIS — N139 Obstructive and reflux uropathy, unspecified: Secondary | ICD-10-CM | POA: Diagnosis present

## 2017-05-30 DIAGNOSIS — Z79899 Other long term (current) drug therapy: Secondary | ICD-10-CM

## 2017-05-30 DIAGNOSIS — N183 Chronic kidney disease, stage 3 unspecified: Secondary | ICD-10-CM | POA: Diagnosis present

## 2017-05-30 DIAGNOSIS — N179 Acute kidney failure, unspecified: Secondary | ICD-10-CM | POA: Diagnosis present

## 2017-05-30 DIAGNOSIS — J810 Acute pulmonary edema: Secondary | ICD-10-CM | POA: Diagnosis not present

## 2017-05-30 DIAGNOSIS — R188 Other ascites: Secondary | ICD-10-CM | POA: Diagnosis not present

## 2017-05-30 DIAGNOSIS — Z923 Personal history of irradiation: Secondary | ICD-10-CM

## 2017-05-30 DIAGNOSIS — Z823 Family history of stroke: Secondary | ICD-10-CM

## 2017-05-30 DIAGNOSIS — D638 Anemia in other chronic diseases classified elsewhere: Secondary | ICD-10-CM | POA: Diagnosis present

## 2017-05-30 DIAGNOSIS — E8770 Fluid overload, unspecified: Secondary | ICD-10-CM | POA: Diagnosis not present

## 2017-05-30 DIAGNOSIS — E43 Unspecified severe protein-calorie malnutrition: Secondary | ICD-10-CM | POA: Diagnosis present

## 2017-05-30 DIAGNOSIS — G4700 Insomnia, unspecified: Secondary | ICD-10-CM | POA: Diagnosis present

## 2017-05-30 DIAGNOSIS — Z6828 Body mass index (BMI) 28.0-28.9, adult: Secondary | ICD-10-CM

## 2017-05-30 DIAGNOSIS — R319 Hematuria, unspecified: Secondary | ICD-10-CM

## 2017-05-30 DIAGNOSIS — I69351 Hemiplegia and hemiparesis following cerebral infarction affecting right dominant side: Secondary | ICD-10-CM | POA: Diagnosis not present

## 2017-05-30 DIAGNOSIS — R0600 Dyspnea, unspecified: Secondary | ICD-10-CM

## 2017-05-30 DIAGNOSIS — E1122 Type 2 diabetes mellitus with diabetic chronic kidney disease: Secondary | ICD-10-CM | POA: Diagnosis present

## 2017-05-30 DIAGNOSIS — R627 Adult failure to thrive: Secondary | ICD-10-CM

## 2017-05-30 DIAGNOSIS — C61 Malignant neoplasm of prostate: Secondary | ICD-10-CM | POA: Diagnosis not present

## 2017-05-30 DIAGNOSIS — R0602 Shortness of breath: Secondary | ICD-10-CM | POA: Diagnosis not present

## 2017-05-30 DIAGNOSIS — J90 Pleural effusion, not elsewhere classified: Secondary | ICD-10-CM

## 2017-05-30 DIAGNOSIS — K5730 Diverticulosis of large intestine without perforation or abscess without bleeding: Secondary | ICD-10-CM | POA: Diagnosis not present

## 2017-05-30 DIAGNOSIS — Z87891 Personal history of nicotine dependence: Secondary | ICD-10-CM

## 2017-05-30 DIAGNOSIS — R4182 Altered mental status, unspecified: Secondary | ICD-10-CM

## 2017-05-30 DIAGNOSIS — Z515 Encounter for palliative care: Secondary | ICD-10-CM

## 2017-05-30 DIAGNOSIS — Z8546 Personal history of malignant neoplasm of prostate: Secondary | ICD-10-CM

## 2017-05-30 DIAGNOSIS — E1129 Type 2 diabetes mellitus with other diabetic kidney complication: Secondary | ICD-10-CM | POA: Diagnosis present

## 2017-05-30 DIAGNOSIS — N390 Urinary tract infection, site not specified: Secondary | ICD-10-CM | POA: Diagnosis present

## 2017-05-30 DIAGNOSIS — B029 Zoster without complications: Secondary | ICD-10-CM | POA: Diagnosis present

## 2017-05-30 DIAGNOSIS — Z794 Long term (current) use of insulin: Secondary | ICD-10-CM

## 2017-05-30 DIAGNOSIS — Z7901 Long term (current) use of anticoagulants: Secondary | ICD-10-CM

## 2017-05-30 DIAGNOSIS — K8020 Calculus of gallbladder without cholecystitis without obstruction: Secondary | ICD-10-CM | POA: Diagnosis present

## 2017-05-30 DIAGNOSIS — E8809 Other disorders of plasma-protein metabolism, not elsewhere classified: Secondary | ICD-10-CM | POA: Diagnosis present

## 2017-05-30 DIAGNOSIS — Z888 Allergy status to other drugs, medicaments and biological substances status: Secondary | ICD-10-CM

## 2017-05-30 DIAGNOSIS — K5900 Constipation, unspecified: Secondary | ICD-10-CM | POA: Diagnosis not present

## 2017-05-30 DIAGNOSIS — G4733 Obstructive sleep apnea (adult) (pediatric): Secondary | ICD-10-CM | POA: Diagnosis present

## 2017-05-30 DIAGNOSIS — Z436 Encounter for attention to other artificial openings of urinary tract: Secondary | ICD-10-CM | POA: Diagnosis not present

## 2017-05-30 DIAGNOSIS — Z936 Other artificial openings of urinary tract status: Secondary | ICD-10-CM

## 2017-05-30 DIAGNOSIS — I4891 Unspecified atrial fibrillation: Secondary | ICD-10-CM | POA: Diagnosis present

## 2017-05-30 DIAGNOSIS — D631 Anemia in chronic kidney disease: Secondary | ICD-10-CM | POA: Diagnosis not present

## 2017-05-30 DIAGNOSIS — Z66 Do not resuscitate: Secondary | ICD-10-CM | POA: Diagnosis not present

## 2017-05-30 DIAGNOSIS — I480 Paroxysmal atrial fibrillation: Secondary | ICD-10-CM | POA: Diagnosis present

## 2017-05-30 DIAGNOSIS — R109 Unspecified abdominal pain: Secondary | ICD-10-CM | POA: Diagnosis present

## 2017-05-30 LAB — URINALYSIS, ROUTINE W REFLEX MICROSCOPIC
BILIRUBIN URINE: NEGATIVE
Bilirubin Urine: NEGATIVE
GLUCOSE, UA: NEGATIVE mg/dL
Glucose, UA: NEGATIVE mg/dL
KETONES UR: NEGATIVE mg/dL
Ketones, ur: NEGATIVE mg/dL
NITRITE: NEGATIVE
Nitrite: NEGATIVE
PH: 6 (ref 5.0–8.0)
PROTEIN: 100 mg/dL — AB
Protein, ur: 100 mg/dL — AB
SPECIFIC GRAVITY, URINE: 1.012 (ref 1.005–1.030)
SPECIFIC GRAVITY, URINE: 1.013 (ref 1.005–1.030)
SQUAMOUS EPITHELIAL / LPF: NONE SEEN
pH: 5 (ref 5.0–8.0)

## 2017-05-30 LAB — CBC WITH DIFFERENTIAL/PLATELET
BASOS PCT: 1 %
Basophils Absolute: 0.1 10*3/uL (ref 0.0–0.1)
EOS ABS: 0.2 10*3/uL (ref 0.0–0.7)
EOS PCT: 2 %
HCT: 37.6 % — ABNORMAL LOW (ref 39.0–52.0)
HEMOGLOBIN: 12.2 g/dL — AB (ref 13.0–17.0)
Lymphocytes Relative: 12 %
Lymphs Abs: 1 10*3/uL (ref 0.7–4.0)
MCH: 30.1 pg (ref 26.0–34.0)
MCHC: 32.4 g/dL (ref 30.0–36.0)
MCV: 92.8 fL (ref 78.0–100.0)
MONOS PCT: 17 %
Monocytes Absolute: 1.4 10*3/uL — ABNORMAL HIGH (ref 0.1–1.0)
NEUTROS PCT: 68 %
Neutro Abs: 5.7 10*3/uL (ref 1.7–7.7)
Platelets: 249 10*3/uL (ref 150–400)
RBC: 4.05 MIL/uL — ABNORMAL LOW (ref 4.22–5.81)
RDW: 15.2 % (ref 11.5–15.5)
WBC: 8.2 10*3/uL (ref 4.0–10.5)

## 2017-05-30 LAB — COMPREHENSIVE METABOLIC PANEL
ALK PHOS: 98 U/L (ref 38–126)
ALT: 27 U/L (ref 17–63)
AST: 36 U/L (ref 15–41)
Albumin: 2.8 g/dL — ABNORMAL LOW (ref 3.5–5.0)
Anion gap: 9 (ref 5–15)
BUN: 22 mg/dL — AB (ref 6–20)
CALCIUM: 9.5 mg/dL (ref 8.9–10.3)
CO2: 26 mmol/L (ref 22–32)
CREATININE: 1.51 mg/dL — AB (ref 0.61–1.24)
Chloride: 99 mmol/L — ABNORMAL LOW (ref 101–111)
GFR, EST AFRICAN AMERICAN: 49 mL/min — AB (ref >60)
GFR, EST NON AFRICAN AMERICAN: 42 mL/min — AB (ref >60)
Glucose, Bld: 155 mg/dL — ABNORMAL HIGH (ref 65–99)
Potassium: 4.8 mmol/L (ref 3.5–5.1)
Sodium: 134 mmol/L — ABNORMAL LOW (ref 135–145)
Total Bilirubin: 0.9 mg/dL (ref 0.3–1.2)
Total Protein: 5.8 g/dL — ABNORMAL LOW (ref 6.5–8.1)

## 2017-05-30 LAB — I-STAT TROPONIN, ED: TROPONIN I, POC: 0.08 ng/mL (ref 0.00–0.08)

## 2017-05-30 LAB — I-STAT CG4 LACTIC ACID, ED: Lactic Acid, Venous: 1.97 mmol/L (ref 0.5–1.9)

## 2017-05-30 MED ORDER — HYDROCODONE-ACETAMINOPHEN 5-325 MG PO TABS
1.0000 | ORAL_TABLET | Freq: Once | ORAL | Status: AC
  Administered 2017-05-30: 1 via ORAL
  Filled 2017-05-30: qty 1

## 2017-05-30 MED ORDER — IOPAMIDOL (ISOVUE-300) INJECTION 61%
INTRAVENOUS | Status: AC
  Filled 2017-05-30: qty 30

## 2017-05-30 MED ORDER — SODIUM CHLORIDE 0.9 % IV BOLUS (SEPSIS)
1000.0000 mL | Freq: Once | INTRAVENOUS | Status: AC
  Administered 2017-05-30: 1000 mL via INTRAVENOUS

## 2017-05-30 MED ORDER — LORAZEPAM 1 MG PO TABS
0.5000 mg | ORAL_TABLET | Freq: Once | ORAL | Status: AC
  Administered 2017-05-30: 0.5 mg via ORAL
  Filled 2017-05-30: qty 1

## 2017-05-30 MED ORDER — CYCLOBENZAPRINE HCL 10 MG PO TABS
10.0000 mg | ORAL_TABLET | Freq: Once | ORAL | Status: AC
  Administered 2017-05-30: 10 mg via ORAL
  Filled 2017-05-30: qty 1

## 2017-05-30 NOTE — ED Triage Notes (Signed)
Pt had nephrostomy tube placed on 6/9. He went to rehab and was discharged on 6/30. Over the last 2 days pt has had decreased output from the tube as well as some bloody discharge. Pt and family also report increased shortness of breath.

## 2017-05-30 NOTE — Care Management (Signed)
ED  CM noted patient to have been recently discharged from CIR. Patient was discharged with [facility] services [facility], PT, OT. CM met with patient and family to confirm information. Patient presents to ED tonight with c/o decrease output from the nephrostomy tubes. ED evaluation still in progress.

## 2017-05-30 NOTE — ED Notes (Addendum)
Right nephrostomy tube output 156ml, color brown.  Left nephrostomy tube output 90 mL, color red.

## 2017-05-30 NOTE — ED Provider Notes (Signed)
[facility] DEPT Provider Note   CSN: [id number] Arrival date & time: 06/01/2017  1557     History   Chief Complaint Chief Complaint  Patient presents with  . Post-op Problem    HPI William [name] is a 83 y.o. male.  HPI William [name] is a 83 y.o. male with hx of HTN, DM, prostate ca status post radiation, presents to ED with decreased urine and blood in his nephrostomy tubes. Pt was admitted on 6/9 for renal failure which was thought to be due to obstructive uropathy from a stricture. Pt had stenting done which failed. He then had bilateral nephrostomy tubes placed and was discharged to Rehab facility where he stayed for 2 wks. Pt's cratinine trended down from 5.29 upon admission to 1.1. Per family, pt has been home for 3 days, and since discharged has been having increased swelling in LE, and decreased nephrostomy output by almost half. His urine intially after nephrostomy placement was blood but per family cleared up to yellow. In the last 3 days, urine again started to become darker and bloody. Pt does not complain of any pain. No fever or chills. Poor PO intake but family state this is chronic. Pt also broken out in a  New rash onset today which is red, on trunk, abd, back, legs. Does not appear to be itchy. Family state pt's breathing appears to be more labored. Pt is on xarelto  Past Medical History:  Diagnosis Date  . Arthritis   . Diabetes mellitus without complication ([place])   . Hypercholesteremia   . Hypertension   . Kidney stones   . Prostate cancer [facility])    with radiation treatment  . Sleep apnea 2/14   "mild per patient" hasnt gotten CPAP machine  . Stroke [facility])     Patient Active Problem List   Diagnosis Date Noted  . Stage 3 chronic kidney disease   . Nephrostomy tube bleed ([place])   . Anxiety state   . Sleep disturbance   . Labile blood glucose   . Debilitated 05/13/2017  . Protein-calorie malnutrition, severe 05/10/2017  . History of prostate cancer   .  Hydronephrosis, bilateral   . AKI (acute kidney injury) ([place])   . Diabetes mellitus type 2 in nonobese (HCC)   . Atrial fibrillation ([place])   . Benign essential HTN   . Leukocytosis   . Acute blood loss anemia   . Hydronephrosis   . Acute renal failure (ARF) ([place]) 05/06/2017  . Acute lower UTI 05/06/2017  . Occult GI bleeding 05/06/2017  . Stroke ([place]) 10/26/2016  . Obese 05/07/2014  . Expected blood loss anemia 05/07/2014  . S/P right TKA 05/06/2014  . DJD (degenerative joint disease) of knee 03/25/2014  . Preop cardiovascular exam 03/19/2014  . Abnormal ECG 03/19/2014  . Hypertension   . IDDM (insulin dependent diabetes mellitus) ([place])   . Hypercholesteremia   . Arthritis   . Prostate cancer ([place])   . Sleep apnea     Past Surgical History:  Procedure Laterality Date  . ADENOIDECTOMY    . CYSTOSCOPY W/ URETERAL STENT PLACEMENT Bilateral 05/07/2017   Procedure: CYSTOSCOPY WITH BILATERAL RETROGRADE PYELOGRAM CYSTOGRAM BILATERAL URETERAL STENT PLACEMENT;  Surgeon: Festus Aloe, MD;  Location: [facility];  Service: Urology;  Laterality: Bilateral;  . FLEXIBLE SIGMOIDOSCOPY N/A 09/12/2013   Procedure: FLEXIBLE SIGMOIDOSCOPY;  Surgeon: Milus Banister, MD;  Location: [facility];  Service: Endoscopy;  Laterality: N/A;  . HERNIA REPAIR    . HOT  HEMOSTASIS N/A 09/12/2013   Procedure: HOT HEMOSTASIS (ARGON PLASMA COAGULATION/BICAP);  Surgeon: Milus Banister, MD;  Location: [facility];  Service: Endoscopy;  Laterality: N/A;  . IR NEPHROSTOMY PLACEMENT LEFT  05/09/2017  . IR NEPHROSTOMY PLACEMENT RIGHT  05/09/2017  . LIPOMA EXCISION    . OTHER SURGICAL HISTORY     s/p prostate radiation  . TONSILLECTOMY    . TOTAL KNEE ARTHROPLASTY Left 03/25/2014   Procedure: LEFT TOTAL KNEE ARTHROPLASTY;  Surgeon: [name], MD;  Location: [facility];  Service: Orthopedics;  Laterality: Left;  . TOTAL KNEE ARTHROPLASTY Right 05/06/2014   Procedure: RIGHT TOTAL KNEE ARTHROPLASTY;  Surgeon: [name], MD;  Location: [facility];  Service: Orthopedics;  Laterality: Right;       Home Medications    Prior to Admission medications   Medication Sig Start Date End Date Taking? Authorizing Provider  acetaminophen (TYLENOL) 325 MG tablet Take 325-650 mg by mouth every 6 (six) hours as needed for mild pain.    [provider]  amLODipine (NORVASC) 5 MG tablet Take 1 tablet (5 mg total) by mouth at bedtime. 05/25/17   Angiulli, Lavon Paganini, PA-C  apixaban (ELIQUIS) 5 MG TABS tablet Take 1 tablet (5 mg total) by mouth 2 (two) times daily. 05/25/17   Angiulli, Lavon Paganini, PA-C  LEVEMIR FLEXTOUCH 100 UNIT/ML Pen Inject 6 Units into the skin at bedtime. 05/26/17   Love, Ivan Anchors, PA-C  loperamide (IMODIUM A-D) 2 MG tablet Take 2 mg by mouth 4 (four) times daily as needed for diarrhea or loose stools.    [provider]  LORazepam (ATIVAN) 1 MG tablet Take 0.5-1 tablets (0.5-1 mg total) by mouth at bedtime as needed for anxiety or sleep. 05/26/17   Love, Ivan Anchors, PA-C  methocarbamol (ROBAXIN) 500 MG tablet Take 1 tablet (500 mg total) by mouth 2 (two) times daily as needed for muscle spasms. 05/25/17   Angiulli, Lavon Paganini, PA-C  multivitamin (RENA-VIT) TABS tablet Take 1 tablet by mouth at bedtime. 05/25/17   Angiulli, Lavon Paganini, PA-C  nystatin cream (MYCOSTATIN) Apply topically 3 (three) times daily. 05/26/17   Love, Ivan Anchors, PA-C  pravastatin (PRAVACHOL) 40 MG tablet Take 1 tablet (40 mg total) by mouth daily with supper. 05/25/17   Angiulli, Lavon Paganini, PA-C    Family History Family History  Problem Relation Age of Onset  . Colon cancer Father   . Stroke Maternal Grandmother   . Esophageal cancer Neg Hx   . Rectal cancer Neg Hx   . Stomach cancer Neg Hx     Social History Social History  Substance Use Topics  . Smoking status: Former Smoker    Start date: 11/28/1982  . Smokeless tobacco: Never Used  . Alcohol use 0.6 oz/week    1 Shots of liquor per week     Comment: occasionally      Allergies   Glyburide   Review of Systems Review of Systems  Constitutional: Negative for chills and fever.  Respiratory: Positive for shortness of breath. Negative for cough and chest tightness.   Cardiovascular: Positive for leg swelling. Negative for chest pain and palpitations.  Gastrointestinal: Negative for abdominal distention, abdominal pain, diarrhea, nausea and vomiting.  Genitourinary: Positive for hematuria. Negative for dysuria.  Musculoskeletal: Negative for arthralgias, myalgias, neck pain and neck stiffness.  Skin: Positive for rash.  Allergic/Immunologic: Negative for immunocompromised state.  Neurological: Negative for dizziness, weakness, light-headedness, numbness and headaches.  All other systems reviewed  and are negative.    Physical Exam Updated Vital Signs BP 125/74 (BP Location: Left Arm)   Pulse 75   Temp 97.7 F (36.5 C) (Oral)   Resp 20   Ht 6' 1.5" (1.867 m)   Wt 91.6 kg (202 lb)   SpO2 97%   BMI 26.29 kg/m   Physical Exam  Constitutional: He is oriented to person, place, and time. He appears well-developed and well-nourished. No distress.  HENT:  Head: Normocephalic and atraumatic.  Oral mucosa dry  Eyes: Conjunctivae are normal.  Neck: Neck supple.  Cardiovascular: Normal rate, regular rhythm and normal heart sounds.   Pulmonary/Chest: Effort normal. No respiratory distress. He has no wheezes. He has no rales.  Abdominal: Soft. Bowel sounds are normal. He exhibits no distension. There is no tenderness. There is no rebound.  Nephrostomy tubes bilaterally, draining dark brown urine. Nephrostomy sites appear to be normal.   Musculoskeletal: He exhibits edema.  Trace peripheral edema bilaterally  Neurological: He is alert and oriented to person, place, and time.  Skin: Skin is warm and dry.  Erythematous small papules scattered over chest, abd, back, with central clearing  Nursing note and vitals reviewed.    ED Treatments /  Results  Labs (all labs ordered are listed, but only abnormal results are displayed) Labs Reviewed  COMPREHENSIVE METABOLIC PANEL - Abnormal; Notable for the following:       Result Value   Sodium 134 (*)    Chloride 99 (*)    Glucose, Bld 155 (*)    BUN 22 (*)    Creatinine, Ser 1.51 (*)    Total Protein 5.8 (*)    Albumin 2.8 (*)    GFR calc non Af Amer 42 (*)    GFR calc Af Amer 49 (*)    All other components within normal limits  CBC WITH DIFFERENTIAL/PLATELET - Abnormal; Notable for the following:    RBC 4.05 (*)    Hemoglobin 12.2 (*)    HCT 37.6 (*)    Monocytes Absolute 1.4 (*)    All other components within normal limits  I-STAT CG4 LACTIC ACID, ED - Abnormal; Notable for the following:    Lactic Acid, Venous 1.97 (*)    All other components within normal limits  URINALYSIS, ROUTINE W REFLEX MICROSCOPIC  URINALYSIS, ROUTINE W REFLEX MICROSCOPIC  I-STAT TROPOININ, ED    EKG  EKG Interpretation None       Radiology Dg Chest 2 View  Result Date: 06/22/2017 CLINICAL DATA:  Shortness of breath for several days EXAM: CHEST  2 VIEW COMPARISON:  05/15/2017 FINDINGS: Cardiac shadow is stable. The lungs are well aerated bilaterally. Chronic blunting of the costophrenic angles is again seen. No focal infiltrate is noted. No acute bony abnormality is noted. IMPRESSION: Chronic changes without acute abnormality. Electronically Signed   By: Inez Catalina M.D.   On: 06/04/2017 16:39    Procedures Procedures (including critical care time)  Medications Ordered in ED Medications  sodium chloride 0.9 % bolus 1,000 mL (not administered)     Initial Impression / Assessment and Plan / ED Course  I have reviewed the triage vital signs and the nursing notes.  Pertinent labs & imaging results that were available during my care of the patient were reviewed by me and considered in my medical decision making (see chart for details).     Patient in emergency department with  decreased urine output and hematuria and bilateral nephrostomy bags. Patient is status post  nephrostomy placement bilaterally for hydronephrosis 1 month ago. Since discharge home he has been having a lot of pain for the last 3 days and today with decreased output and bleeding. Patient is on xarelto. I have reviewed patient's chart and notes from rehabilitation, patient has had increasing pain in his back and discomfort especially at nighttime. He was placed on Ativan, trazodone, and Flexeril. He appears to be comfortable at this time, however daughter and wife state that the symptoms, and go. We will check labs and urinalysis.    Delay in obtaining urine since we emptied the old urine from nephrostomy bags to collect the urine. Lab work showed troponin of 0.08. Lactic acid of 1.97. Patient's lab shows slightly worsening creatinine of 1.51, BUN of 22. Question dehydration with dry oral mucosa and urine output decreased. We will give him IV fluids in emergency department. Hemoglobin is stable at 12.2. White blood cell count is 8.2. He is afebrile, normal vital signs. We'll continue to monitor. Patient is having increasing pain. Already received Flexeril, will give Ativan.   Patient's pain is pierced to be worsening. He is now crying in pain. Will give Vicodin. Will get a CT of abdomen and pelvis for further evaluation.   CT showing fluid overload. Patient is appearing to be having cramps and is more agitated and now screaming in pain. We will get him admitted for pain control and for diuresis. Spoke with hostpitalist, will admit.   Vitals:   06/04/2017 2100 06/01/2017 2130 06/04/2017 2200 06/25/2017 2230  BP: 126/74 108/89 126/77 130/72  Pulse:   68 73  Resp: 17 (!) 36 (!) 29 (!) 31  Temp:      TempSrc:      SpO2:   97% 99%  Weight:      Height:         Final Clinical Impressions(s) / ED Diagnoses   Final diagnoses:  None    New Prescriptions New Prescriptions   No medications on file      Jeannett Senior, PA-C 06/01/17 [name], Wenda Overland, MD 06/07/17 [phone]

## 2017-05-31 ENCOUNTER — Encounter (HOSPITAL_COMMUNITY): Payer: Self-pay | Admitting: Internal Medicine

## 2017-05-31 ENCOUNTER — Emergency Department (HOSPITAL_COMMUNITY): Payer: Medicare Other

## 2017-05-31 DIAGNOSIS — R109 Unspecified abdominal pain: Secondary | ICD-10-CM | POA: Diagnosis present

## 2017-05-31 DIAGNOSIS — R0609 Other forms of dyspnea: Secondary | ICD-10-CM | POA: Diagnosis not present

## 2017-05-31 DIAGNOSIS — E8770 Fluid overload, unspecified: Secondary | ICD-10-CM

## 2017-05-31 DIAGNOSIS — R4182 Altered mental status, unspecified: Secondary | ICD-10-CM | POA: Diagnosis not present

## 2017-05-31 DIAGNOSIS — M7989 Other specified soft tissue disorders: Secondary | ICD-10-CM | POA: Diagnosis not present

## 2017-05-31 DIAGNOSIS — E8809 Other disorders of plasma-protein metabolism, not elsewhere classified: Secondary | ICD-10-CM | POA: Diagnosis present

## 2017-05-31 DIAGNOSIS — N390 Urinary tract infection, site not specified: Secondary | ICD-10-CM | POA: Diagnosis present

## 2017-05-31 DIAGNOSIS — R52 Pain, unspecified: Secondary | ICD-10-CM | POA: Diagnosis not present

## 2017-05-31 DIAGNOSIS — R188 Other ascites: Secondary | ICD-10-CM | POA: Diagnosis present

## 2017-05-31 DIAGNOSIS — I69351 Hemiplegia and hemiparesis following cerebral infarction affecting right dominant side: Secondary | ICD-10-CM | POA: Diagnosis not present

## 2017-05-31 DIAGNOSIS — N183 Chronic kidney disease, stage 3 (moderate): Secondary | ICD-10-CM | POA: Diagnosis present

## 2017-05-31 DIAGNOSIS — Z87891 Personal history of nicotine dependence: Secondary | ICD-10-CM | POA: Diagnosis not present

## 2017-05-31 DIAGNOSIS — I4891 Unspecified atrial fibrillation: Secondary | ICD-10-CM | POA: Diagnosis not present

## 2017-05-31 DIAGNOSIS — E43 Unspecified severe protein-calorie malnutrition: Secondary | ICD-10-CM | POA: Diagnosis present

## 2017-05-31 DIAGNOSIS — Z936 Other artificial openings of urinary tract status: Secondary | ICD-10-CM | POA: Diagnosis not present

## 2017-05-31 DIAGNOSIS — Z7189 Other specified counseling: Secondary | ICD-10-CM | POA: Diagnosis not present

## 2017-05-31 DIAGNOSIS — N179 Acute kidney failure, unspecified: Secondary | ICD-10-CM | POA: Diagnosis present

## 2017-05-31 DIAGNOSIS — R627 Adult failure to thrive: Secondary | ICD-10-CM | POA: Diagnosis not present

## 2017-05-31 DIAGNOSIS — I130 Hypertensive heart and chronic kidney disease with heart failure and stage 1 through stage 4 chronic kidney disease, or unspecified chronic kidney disease: Secondary | ICD-10-CM | POA: Diagnosis present

## 2017-05-31 DIAGNOSIS — K5900 Constipation, unspecified: Secondary | ICD-10-CM | POA: Diagnosis not present

## 2017-05-31 DIAGNOSIS — M545 Low back pain: Secondary | ICD-10-CM | POA: Diagnosis not present

## 2017-05-31 DIAGNOSIS — K5730 Diverticulosis of large intestine without perforation or abscess without bleeding: Secondary | ICD-10-CM | POA: Diagnosis not present

## 2017-05-31 DIAGNOSIS — N135 Crossing vessel and stricture of ureter without hydronephrosis: Secondary | ICD-10-CM | POA: Diagnosis not present

## 2017-05-31 DIAGNOSIS — D631 Anemia in chronic kidney disease: Secondary | ICD-10-CM | POA: Diagnosis not present

## 2017-05-31 DIAGNOSIS — I480 Paroxysmal atrial fibrillation: Secondary | ICD-10-CM | POA: Diagnosis present

## 2017-05-31 DIAGNOSIS — E1129 Type 2 diabetes mellitus with other diabetic kidney complication: Secondary | ICD-10-CM | POA: Diagnosis present

## 2017-05-31 DIAGNOSIS — R0600 Dyspnea, unspecified: Secondary | ICD-10-CM | POA: Diagnosis not present

## 2017-05-31 DIAGNOSIS — I129 Hypertensive chronic kidney disease with stage 1 through stage 4 chronic kidney disease, or unspecified chronic kidney disease: Secondary | ICD-10-CM | POA: Diagnosis not present

## 2017-05-31 DIAGNOSIS — B9689 Other specified bacterial agents as the cause of diseases classified elsewhere: Secondary | ICD-10-CM | POA: Diagnosis present

## 2017-05-31 DIAGNOSIS — J90 Pleural effusion, not elsewhere classified: Secondary | ICD-10-CM | POA: Diagnosis not present

## 2017-05-31 DIAGNOSIS — I5030 Unspecified diastolic (congestive) heart failure: Secondary | ICD-10-CM | POA: Diagnosis present

## 2017-05-31 DIAGNOSIS — K8020 Calculus of gallbladder without cholecystitis without obstruction: Secondary | ICD-10-CM | POA: Diagnosis present

## 2017-05-31 DIAGNOSIS — J810 Acute pulmonary edema: Principal | ICD-10-CM

## 2017-05-31 DIAGNOSIS — N189 Chronic kidney disease, unspecified: Secondary | ICD-10-CM | POA: Diagnosis not present

## 2017-05-31 DIAGNOSIS — N139 Obstructive and reflux uropathy, unspecified: Secondary | ICD-10-CM | POA: Diagnosis present

## 2017-05-31 DIAGNOSIS — D638 Anemia in other chronic diseases classified elsewhere: Secondary | ICD-10-CM | POA: Diagnosis present

## 2017-05-31 DIAGNOSIS — E1122 Type 2 diabetes mellitus with diabetic chronic kidney disease: Secondary | ICD-10-CM | POA: Diagnosis present

## 2017-05-31 DIAGNOSIS — Z515 Encounter for palliative care: Secondary | ICD-10-CM | POA: Diagnosis not present

## 2017-05-31 DIAGNOSIS — Z6828 Body mass index (BMI) 28.0-28.9, adult: Secondary | ICD-10-CM | POA: Diagnosis not present

## 2017-05-31 DIAGNOSIS — Z66 Do not resuscitate: Secondary | ICD-10-CM | POA: Diagnosis not present

## 2017-05-31 LAB — BRAIN NATRIURETIC PEPTIDE: B NATRIURETIC PEPTIDE 5: 268.9 pg/mL — AB (ref 0.0–100.0)

## 2017-05-31 LAB — CBC WITH DIFFERENTIAL/PLATELET
Basophils Absolute: 0 10*3/uL (ref 0.0–0.1)
Basophils Relative: 1 %
Eosinophils Absolute: 0.2 10*3/uL (ref 0.0–0.7)
Eosinophils Relative: 4 %
HEMATOCRIT: 34.2 % — AB (ref 39.0–52.0)
HEMOGLOBIN: 10.9 g/dL — AB (ref 13.0–17.0)
LYMPHS ABS: 0.7 10*3/uL (ref 0.7–4.0)
Lymphocytes Relative: 12 %
MCH: 29.9 pg (ref 26.0–34.0)
MCHC: 31.9 g/dL (ref 30.0–36.0)
MCV: 94 fL (ref 78.0–100.0)
MONO ABS: 1 10*3/uL (ref 0.1–1.0)
MONOS PCT: 17 %
NEUTROS ABS: 3.8 10*3/uL (ref 1.7–7.7)
NEUTROS PCT: 66 %
Platelets: 222 10*3/uL (ref 150–400)
RBC: 3.64 MIL/uL — ABNORMAL LOW (ref 4.22–5.81)
RDW: 15.5 % (ref 11.5–15.5)
WBC: 5.8 10*3/uL (ref 4.0–10.5)

## 2017-05-31 LAB — COMPREHENSIVE METABOLIC PANEL
ALBUMIN: 2.3 g/dL — AB (ref 3.5–5.0)
ALK PHOS: 81 U/L (ref 38–126)
ALT: 22 U/L (ref 17–63)
ANION GAP: 5 (ref 5–15)
AST: 27 U/L (ref 15–41)
BUN: 19 mg/dL (ref 6–20)
CHLORIDE: 103 mmol/L (ref 101–111)
CO2: 27 mmol/L (ref 22–32)
Calcium: 8.6 mg/dL — ABNORMAL LOW (ref 8.9–10.3)
Creatinine, Ser: 1.36 mg/dL — ABNORMAL HIGH (ref 0.61–1.24)
GFR calc non Af Amer: 48 mL/min — ABNORMAL LOW (ref >60)
GFR, EST AFRICAN AMERICAN: 55 mL/min — AB (ref >60)
GLUCOSE: 99 mg/dL (ref 65–99)
Potassium: 4.6 mmol/L (ref 3.5–5.1)
SODIUM: 135 mmol/L (ref 135–145)
Total Bilirubin: 0.9 mg/dL (ref 0.3–1.2)
Total Protein: 5 g/dL — ABNORMAL LOW (ref 6.5–8.1)

## 2017-05-31 LAB — GLUCOSE, CAPILLARY
GLUCOSE-CAPILLARY: 87 mg/dL (ref 65–99)
Glucose-Capillary: 111 mg/dL — ABNORMAL HIGH (ref 65–99)
Glucose-Capillary: 76 mg/dL (ref 65–99)
Glucose-Capillary: 144 mg/dL — ABNORMAL HIGH (ref 65–99)
Glucose-Capillary: 206 mg/dL — ABNORMAL HIGH (ref 65–99)

## 2017-05-31 LAB — TSH: TSH: 7.023 u[IU]/mL — ABNORMAL HIGH (ref 0.350–4.500)

## 2017-05-31 LAB — TROPONIN I
TROPONIN I: 0.07 ng/mL — AB (ref <0.03)
Troponin I: 0.08 ng/mL (ref <0.03)
Troponin I: 0.07 ng/mL (ref <0.03)

## 2017-05-31 MED ORDER — RENA-VITE PO TABS
1.0000 | ORAL_TABLET | Freq: Every day | ORAL | Status: DC
  Administered 2017-05-31 – 2017-06-04 (×4): 1 via ORAL
  Filled 2017-05-31 (×4): qty 1

## 2017-05-31 MED ORDER — MORPHINE SULFATE (PF) 4 MG/ML IV SOLN
4.0000 mg | Freq: Once | INTRAVENOUS | Status: AC
  Administered 2017-05-31: 4 mg via INTRAVENOUS
  Filled 2017-05-31: qty 1

## 2017-05-31 MED ORDER — ACETAMINOPHEN 650 MG RE SUPP: 650.0000 mg | Freq: Four times a day (QID) | RECTAL | Status: DC | PRN

## 2017-05-31 MED ORDER — ACETAMINOPHEN 325 MG PO TABS
650.0000 mg | ORAL_TABLET | Freq: Four times a day (QID) | ORAL | Status: DC | PRN
  Administered 2017-05-31 – 2017-06-02 (×6): 650 mg via ORAL
  Filled 2017-05-31 (×6): qty 2

## 2017-05-31 MED ORDER — AMLODIPINE BESYLATE 5 MG PO TABS
5.0000 mg | ORAL_TABLET | Freq: Every day | ORAL | Status: DC
  Administered 2017-05-31 – 2017-06-04 (×4): 5 mg via ORAL
  Filled 2017-05-31 (×4): qty 1

## 2017-05-31 MED ORDER — MORPHINE SULFATE (PF) 2 MG/ML IV SOLN
1.0000 mg | INTRAVENOUS | Status: DC | PRN
  Administered 2017-05-31 – 2017-06-02 (×4): 1 mg via INTRAVENOUS
  Filled 2017-05-31 (×3): qty 1

## 2017-05-31 MED ORDER — INSULIN ASPART 100 UNIT/ML ~~LOC~~ SOLN
0.0000 [IU] | Freq: Three times a day (TID) | SUBCUTANEOUS | Status: DC
  Administered 2017-05-31: 1 [IU] via SUBCUTANEOUS
  Administered 2017-06-01: 2 [IU] via SUBCUTANEOUS
  Administered 2017-06-01 (×2): 1 [IU] via SUBCUTANEOUS
  Administered 2017-06-02: 3 [IU] via SUBCUTANEOUS
  Administered 2017-06-02: 2 [IU] via SUBCUTANEOUS
  Administered 2017-06-02 – 2017-06-03 (×2): 3 [IU] via SUBCUTANEOUS
  Administered 2017-06-03: 5 [IU] via SUBCUTANEOUS
  Administered 2017-06-03: 3 [IU] via SUBCUTANEOUS
  Administered 2017-06-04: 5 [IU] via SUBCUTANEOUS

## 2017-05-31 MED ORDER — METHOCARBAMOL 500 MG PO TABS
500.0000 mg | ORAL_TABLET | Freq: Two times a day (BID) | ORAL | Status: DC | PRN
  Administered 2017-05-31 – 2017-06-02 (×3): 500 mg via ORAL
  Filled 2017-05-31 (×3): qty 1

## 2017-05-31 MED ORDER — INSULIN DETEMIR 100 UNIT/ML ~~LOC~~ SOLN
6.0000 [IU] | Freq: Every day | SUBCUTANEOUS | Status: DC
  Administered 2017-05-31 – 2017-06-04 (×4): 6 [IU] via SUBCUTANEOUS
  Filled 2017-05-31 (×5): qty 0.06

## 2017-05-31 MED ORDER — MIRTAZAPINE 15 MG PO TABS: 7.5000 mg | ORAL_TABLET | Freq: Every day | ORAL | Status: DC

## 2017-05-31 MED ORDER — CEFTRIAXONE SODIUM 1 G IJ SOLR
1.0000 g | INTRAMUSCULAR | Status: DC
  Administered 2017-05-31 – 2017-06-02 (×3): 1 g via INTRAVENOUS
  Filled 2017-05-31 (×3): qty 10

## 2017-05-31 MED ORDER — ENSURE ENLIVE PO LIQD
237.0000 mL | Freq: Three times a day (TID) | ORAL | Status: DC
  Administered 2017-05-31 – 2017-06-03 (×8): 237 mL via ORAL

## 2017-05-31 MED ORDER — ENSURE ENLIVE PO LIQD
237.0000 mL | Freq: Two times a day (BID) | ORAL | Status: DC
  Administered 2017-05-31: 237 mL via ORAL

## 2017-05-31 MED ORDER — ONDANSETRON HCL 4 MG/2ML IJ SOLN
4.0000 mg | Freq: Once | INTRAMUSCULAR | Status: AC
  Administered 2017-05-31: 4 mg via INTRAVENOUS
  Filled 2017-05-31: qty 2

## 2017-05-31 MED ORDER — TRAZODONE HCL 50 MG PO TABS
50.0000 mg | ORAL_TABLET | Freq: Every day | ORAL | Status: DC
  Administered 2017-05-31: 50 mg via ORAL
  Filled 2017-05-31: qty 1

## 2017-05-31 MED ORDER — ONDANSETRON HCL 4 MG PO TABS: 4.0000 mg | ORAL_TABLET | Freq: Four times a day (QID) | ORAL | Status: DC | PRN

## 2017-05-31 MED ORDER — FUROSEMIDE 10 MG/ML IJ SOLN
60.0000 mg | Freq: Two times a day (BID) | INTRAMUSCULAR | Status: DC
  Administered 2017-05-31 (×2): 60 mg via INTRAVENOUS
  Filled 2017-05-31 (×2): qty 6

## 2017-05-31 MED ORDER — PRAVASTATIN SODIUM 40 MG PO TABS
40.0000 mg | ORAL_TABLET | Freq: Every day | ORAL | Status: DC
  Administered 2017-05-31 – 2017-06-03 (×4): 40 mg via ORAL
  Filled 2017-05-31 (×4): qty 1

## 2017-05-31 MED ORDER — APIXABAN 5 MG PO TABS
5.0000 mg | ORAL_TABLET | Freq: Two times a day (BID) | ORAL | Status: DC
  Administered 2017-05-31 – 2017-06-04 (×8): 5 mg via ORAL
  Filled 2017-05-31 (×8): qty 1

## 2017-05-31 MED ORDER — ONDANSETRON HCL 4 MG/2ML IJ SOLN
4.0000 mg | Freq: Four times a day (QID) | INTRAMUSCULAR | Status: DC | PRN
  Administered 2017-05-31 – 2017-06-03 (×7): 4 mg via INTRAVENOUS
  Filled 2017-05-31 (×7): qty 2

## 2017-05-31 MED ORDER — LORAZEPAM 0.5 MG PO TABS
0.5000 mg | ORAL_TABLET | Freq: Every evening | ORAL | Status: DC | PRN
  Administered 2017-06-01 – 2017-06-02 (×2): 1 mg via ORAL
  Administered 2017-06-03: 0.5 mg via ORAL
  Filled 2017-05-31 (×3): qty 2

## 2017-05-31 NOTE — Care Management Note (Signed)
Case Management Note Marvetta Gibbons RN, BSN Unit 2W-Case Manager [phone]  Patient Details  Name: William [name] MRN: [id number] Date of Birth: 01-Nov-1934  Subjective/Objective:    Pt admitted with shortness of breath , recent d/c from CIR 3 days ago- has nephrostomy tubes             Action/Plan: PTA pt from home with wife- was active with [facility] for HHRN/PT/OT- if returns home will need resumption orders for discharge- CM to follow  Expected Discharge Date:                  Expected Discharge Plan:  [place]  In-House Referral:     Discharge planning Services  CM Consult  Post Acute Care Choice:  [place], Resumption of Svcs/PTA Provider Choice offered to:  Patient  DME Arranged:    DME Agency:     HH Arranged:    [place] Agency:  [facility] [employer]  Status of Service:  In process, will continue to follow  If discussed at Long Length of Stay Meetings, dates discussed:    Additional Comments:  Dawayne Patricia, RN 05/31/2017, 11:04 AM

## 2017-05-31 NOTE — Progress Notes (Signed)
Patient ID: William [name], male   DOB: March 12, 1934, 83 y.o.   MRN: [id number]  PROGRESS NOTE    William [name]  BPZ:[id number] DOB: Aug 11, 1934 DOA: 06/07/2017 PCP: Jani Gravel, MD   Brief Narrative:  83 year old male with history of paroxysmal atrial fibrillation on anticoagulation, diabetes mellitus, grade 2 diastolic dysfunction who was admitted last month for obstructive uropathy requiring initial stents which failed and hence patient required bilateral nephrostomy tubes and was discharged to rehabilitation unit and subsequently discharged a few days ago from the rehabilitation unit and was readmitted yesterday for worsening shortness of breath and lower extremity edema; CT abdomen showing fluid overload with pleural effusion.   Assessment & Plan:   Principal Problem:   Acute pulmonary edema (HCC) Active Problems:   History of prostate cancer   Atrial fibrillation (HCC)   Stage 3 chronic kidney disease   Abdominal pain   Type 2 diabetes mellitus with renal complication (HCC)   Fluid overload   1. Fluid overload/pulmonary edema - patient appears in anasarca. After discussion with the patient's family members, they have agreed for intravenous diuresis. Start Lasix 60 mg IV every 12 hours. I spoke with the nephrologist on call on phone today and he'll see the patient in consultation. Strict input and output, daily weights. Monitor creatinine. Last 2-D echo done in December 2071 showing EF of 60-65% with grade 2 diastolic dysfunction. Repeat 2-D echo. Lower extremity DUPLEX TO RULE OUT DVT. 2. Bilateral flank pain with recent nephrostomy tubes placement - CT scan does not show anything acute. Pain may be related to fluid overload. Spoke to on call urologist Dr. Pilar Jarvis and reviewed the case with him. He thinks that since the CAT scan does not show any obstruction of the nephrostomy tubes, inpatient urology evaluation is not needed. Patient would benefit from inpatient nephrology consultation. Patient  can follow up with Dr. Junious Silk as an outpatient 3. Diabetes mellitus type 2 - continue Levemir and sliding scale insulin coverage 4. Paroxysmal atrial fibrillation - chads 2 vascular score more than 2. Patient is on Apixaban. 5. Normocytic normochromic anemia - follow CBC. Has had colonoscopy in 2014 which showed radiation proctitis and also diverticulosis. 6. Cholelithiasis - no signs of acute cholecystitis. Follow clinically. 7. Possible UTI - follow urine cultures. Continue ceftriaxone. 8. Moderate to severe malnutrition with hypo-albuminemia- nutrition consult  DVT prophylaxis: Apixaban. Code Status: Full code.  Family Communication: Patient's daughter.  Disposition Plan: To be determined.   Consultants: Nephrology; spoke to urologist on call Dr. Pilar Jarvis on phone  Procedures: None  Antimicrobials: Rocephin from 05/31/2017   Subjective: Patient seen and examined at bedside. He is slightly awake but does not answer much questions. Does not complain of any current pain, nausea, vomiting, fever.  Objective: Vitals:   05/31/17 0130 05/31/17 0145 05/31/17 0200 05/31/17 0242  BP: (!) 114/59   134/75  Pulse: 69 69 70 68  Resp: (!) 28 (!) 24 16 18   Temp:    97.8 F (36.6 C)  TempSrc:    Oral  SpO2: (!) 89% 96% 97% 95%  Weight:    95.1 kg (209 lb 9.6 oz)  Height:    6\' 1"  (1.854 m)    Intake/Output Summary (Last 24 hours) at 05/31/17 1147 Last data filed at 05/31/17 0640  Gross per 24 hour  Intake             2000 ml  Output  365 ml  Net             1635 ml   Filed Weights   06/08/2017 1604 05/31/17 0242  Weight: 91.6 kg (202 lb) 95.1 kg (209 lb 9.6 oz)    Examination:  General exam: Appears calm and comfortable  Respiratory system: Bilateral decreased breath sound at bases With basilar crackles Cardiovascular system: S1 & S2 heard, rate controlled Gastrointestinal system: Abdomen is distended, soft and nontender. Normal bowel sounds heard. Central nervous  system: Awake but slightly drowsy. No focal neurological deficits. Moving extremities Extremities: No cyanosis, clubbing. Bilateral lower extremity 2+ pitting pedal edema  Skin: Scattered few vesicular/pustular rash over the abdomen and back seen Lymph: No cervical lymphadenopathy     Data Reviewed: I have personally reviewed following labs and imaging studies  CBC:  Recent Labs Lab 05/26/17 0531 05/27/17 0428 06/22/2017 1614 05/31/17 0737  WBC 8.6 8.8 8.2 5.8  NEUTROABS 5.9  --  5.7 3.8  HGB 10.6* 10.8* 12.2* 10.9*  HCT 33.2* 33.5* 37.6* 34.2*  MCV 93.5 95.2 92.8 94.0  PLT 290 279 249 242   Basic Metabolic Panel:  Recent Labs Lab 05/26/17 0531 05/31/2017 1614 05/31/17 0737  NA 138 134* 135  K 3.8 4.8 4.6  CL 105 99* 103  CO2 28 26 27   GLUCOSE 84 155* 99  BUN 13 22* 19  CREATININE 1.31* 1.51* 1.36*  CALCIUM 8.7* 9.5 8.6*   GFR: Estimated Creatinine Clearance: 49 mL/min (A) (by C-G formula based on SCr of 1.36 mg/dL (H)). Liver Function Tests:  Recent Labs Lab 05/28/2017 1614 05/31/17 0737  AST 36 27  ALT 27 22  ALKPHOS 98 81  BILITOT 0.9 0.9  PROT 5.8* 5.0*  ALBUMIN 2.8* 2.3*   No results for input(s): LIPASE, AMYLASE in the last 168 hours. No results for input(s): AMMONIA in the last 168 hours. Coagulation Profile: No results for input(s): INR, PROTIME in the last 168 hours. Cardiac Enzymes:  Recent Labs Lab 05/31/17 0737  TROPONINI 0.08*   BNP (last 3 results) No results for input(s): PROBNP in the last 8760 hours. HbA1C: No results for input(s): HGBA1C in the last 72 hours. CBG:  Recent Labs Lab 05/26/17 1620 05/26/17 2140 05/27/17 0644 06/19/2017 2017 05/31/17 0629  GLUCAP 129* 123* 71 111* 76   Lipid Profile: No results for input(s): CHOL, HDL, LDLCALC, TRIG, CHOLHDL, LDLDIRECT in the last 72 hours. Thyroid Function Tests: No results for input(s): TSH, T4TOTAL, FREET4, T3FREE, THYROIDAB in the last 72 hours. Anemia Panel: No results  for input(s): VITAMINB12, FOLATE, FERRITIN, TIBC, IRON, RETICCTPCT in the last 72 hours. Sepsis Labs:  Recent Labs Lab 06/04/2017 1630  LATICACIDVEN 1.97*    No results found for this or any previous visit (from the past 240 hour(s)).       Radiology Studies: Ct Abdomen Pelvis Wo Contrast  Result Date: 05/31/2017 CLINICAL DATA:  Back pain.  Recent nephrostomy tubes. EXAM: CT ABDOMEN AND PELVIS WITHOUT CONTRAST TECHNIQUE: Multidetector CT imaging of the abdomen and pelvis was performed following the standard protocol without IV contrast. COMPARISON:  CT 04/14/2017 FINDINGS: Lower chest: Moderate bilateral pleural effusions and adjacent compressive atelectasis. Mild cardiomegaly with coronary artery calcifications. Minimal pericardial fluid. Hepatobiliary: No evidence of focal lesion allowing for lack contrast. Calcified gallstone within the gallbladder. No biliary dilatation. Small volume of perihepatic ascites. Pancreas: Generalized mesenteric edema, including about the pancreatic head. Spleen: Normal in size without focal abnormality. Adrenals/Urinary Tract: No adrenal nodule. Bilateral nephrostomy tubes,  uncal in the renal pelvis. Bilateral ureteral stent placement with proximal pigtails in the renal pelvis, distal pigtails in the bladder. There is no hydronephrosis. Urinary bladder is completely decompressed. Minimal air in the renal collecting systems likely secondary to nephrostomy placement. No perinephric fluid collection. Nonobstructing stone in the lower left kidney is again seen. Stomach/Bowel: Stomach is physiologically distended. No obstruction, enteric contrast has reached the colon. Few scattered colonic diverticular without acute diverticulitis. Normal appendix. High-density barium within the sigmoid colon and sigmoid colonic diverticula from prior barium swallow. No definite colonic wall thickening. Vascular/Lymphatic: Aortic and branch atherosclerosis. Limited assessment for  adenopathy given lack contrast. No evidence of abdominopelvic adenopathy. Reproductive: Brachytherapy seeds in the prostate gland. Other: Small volume ala of abdominal ascites with fluid tracking in the pericolic gutters. Minimal mesenteric ascites with diffuse mesenteric edema. Ascites tracks within the left inguinal canal. No free air. No intra-abdominal abscess. Mild body wall edema. Musculoskeletal: There are no acute or suspicious osseous abnormalities. IMPRESSION: 1. Bilateral nephrostomy tubes and ureteral stents. No hydronephrosis. Urinary bladder is completely decompressed. 2. Findings consistent with fluid overload with moderate pleural effusions, small volume intra-abdominal ascites and mesenteric edema. 3. Colonic diverticulosis without acute inflammation. 4. Aortic atherosclerosis. 5. Cholelithiasis. Electronically Signed   By: Jeb Levering M.D.   On: 05/31/2017 00:36   Dg Chest 2 View  Result Date: 06/17/2017 CLINICAL DATA:  Shortness of breath for several days EXAM: CHEST  2 VIEW COMPARISON:  05/15/2017 FINDINGS: Cardiac shadow is stable. The lungs are well aerated bilaterally. Chronic blunting of the costophrenic angles is again seen. No focal infiltrate is noted. No acute bony abnormality is noted. IMPRESSION: Chronic changes without acute abnormality. Electronically Signed   By: Inez Catalina M.D.   On: 06/07/2017 16:39        Scheduled Meds: . amLODipine  5 mg Oral QHS  . apixaban  5 mg Oral BID  . feeding supplement (ENSURE ENLIVE)  237 mL Oral BID BM  . furosemide  60 mg Intravenous Q12H  . insulin aspart  0-9 Units Subcutaneous TID WC  . insulin detemir  6 Units Subcutaneous QHS  . multivitamin  1 tablet Oral QHS  . pravastatin  40 mg Oral Q supper  . traZODone  50 mg Oral QHS   Continuous Infusions: . cefTRIAXone (ROCEPHIN)  IV 1 g (05/31/17 1133)     LOS: 0 days        Aline August, MD [facility] Pager [phone]  If 7PM-7AM, please contact  night-coverage [email or web address] Password [email or web address] 05/31/2017, 11:47 AM

## 2017-05-31 NOTE — H&P (Signed)
History and Physical    William [name] EHM:[id number] DOB: [birth year] DOA: 06/18/2017  PCP: Jani Gravel, MD  Patient coming from: Home.  Chief Complaint: Shortness of breath and pain.  HPI: William [name] is a 83 y.o. male with history of paroxysmal atrial fibrillation, diabetes mellitus, grade 2 diastolic dysfunction who was recently admitted last month for obstructive uropathy requiring initially stents but since stents failed patient had nephrostomy tube placed and was discharged to rehabilitation and was discharged from rehabilitation 3 days ago reasons to the ER because of worsening shortness of breath and increasing peripheral edema. Patient's shortness of breath is even at rest and increases on exertion. As been having increasing pain in the bilateral flank area. Denies any nausea vomiting or diarrhea fever or chills. Patient's family also noticed some skin rash over the last 2 days. Appears more like pustules.  ED Course: On exam patient has significant edema in both lower extremities. CT of the abdomen shows fluid overload with pleural effusion. Otherwise unremarkable. Patient at this time is being admitted for pain management and fluid overload. On my exam patient is not in distress but appears anasarca. Denies chest pain or productive cough fever or chills.  Review of Systems: As per HPI, rest all negative.   Past Medical History:  Diagnosis Date  . Arthritis   . Diabetes mellitus without complication ([facility])   . Hypercholesteremia   . Hypertension   . Kidney stones   . Prostate cancer [facility])    with radiation treatment  . Sleep apnea 2/14   "mild per patient" hasnt gotten CPAP machine  . Stroke [facility])     Past Surgical History:  Procedure Laterality Date  . ADENOIDECTOMY    . CYSTOSCOPY W/ URETERAL STENT PLACEMENT Bilateral 05/07/2017   Procedure: CYSTOSCOPY WITH BILATERAL RETROGRADE PYELOGRAM CYSTOGRAM BILATERAL URETERAL STENT PLACEMENT;  Surgeon: Festus Aloe, MD;   Location: [place];  Service: Urology;  Laterality: Bilateral;  . FLEXIBLE SIGMOIDOSCOPY N/A 09/12/2013   Procedure: FLEXIBLE SIGMOIDOSCOPY;  Surgeon: Milus Banister, MD;  Location: [facility];  Service: Endoscopy;  Laterality: N/A;  . HERNIA REPAIR    . HOT HEMOSTASIS N/A 09/12/2013   Procedure: HOT HEMOSTASIS (ARGON PLASMA COAGULATION/BICAP);  Surgeon: Milus Banister, MD;  Location: [facility];  Service: Endoscopy;  Laterality: N/A;  . IR NEPHROSTOMY PLACEMENT LEFT  05/09/2017  . IR NEPHROSTOMY PLACEMENT RIGHT  05/09/2017  . LIPOMA EXCISION    . OTHER SURGICAL HISTORY     s/p prostate radiation  . TONSILLECTOMY    . TOTAL KNEE ARTHROPLASTY Left 03/25/2014   Procedure: LEFT TOTAL KNEE ARTHROPLASTY;  Surgeon: [name], MD;  Location: [facility];  Service: Orthopedics;  Laterality: Left;  . TOTAL KNEE ARTHROPLASTY Right 05/06/2014   Procedure: RIGHT TOTAL KNEE ARTHROPLASTY;  Surgeon: [name], MD;  Location: [facility];  Service: Orthopedics;  Laterality: Right;     reports that he has quit smoking. He started smoking about 34 years ago. He has never used smokeless tobacco. He reports that he drinks about 0.6 oz of alcohol per week . He reports that he does not use drugs.  Allergies  Allergen Reactions  . Glyburide Other (See Comments)    Hypoglycemia     Family History  Problem Relation Age of Onset  . Colon cancer Father   . Stroke Maternal Grandmother   . Esophageal cancer Neg Hx   . Rectal cancer Neg Hx   . Stomach cancer Neg Hx  Prior to Admission medications   Medication Sig Start Date End Date Taking? Authorizing Provider  acetaminophen (TYLENOL) 325 MG tablet Take 325 mg by mouth every 6 (six) hours as needed for mild pain.    Yes [provider]  amLODipine (NORVASC) 5 MG tablet Take 1 tablet (5 mg total) by mouth at bedtime. 05/25/17  Yes Angiulli, Lavon Paganini, PA-C  apixaban (ELIQUIS) 5 MG TABS tablet Take 1 tablet (5 mg total) by mouth 2 (two) times daily.  05/25/17  Yes Angiulli, Lavon Paganini, PA-C  LEVEMIR FLEXTOUCH 100 UNIT/ML Pen Inject 6 Units into the skin at bedtime. 05/26/17  Yes Love, Ivan Anchors, PA-C  LORazepam (ATIVAN) 1 MG tablet Take 0.5-1 tablets (0.5-1 mg total) by mouth at bedtime as needed for anxiety or sleep. 05/26/17  Yes Love, Ivan Anchors, PA-C  methocarbamol (ROBAXIN) 500 MG tablet Take 1 tablet (500 mg total) by mouth 2 (two) times daily as needed for muscle spasms. 05/25/17  Yes Angiulli, Lavon Paganini, PA-C  multivitamin (RENA-VIT) TABS tablet Take 1 tablet by mouth at bedtime. 05/25/17  Yes Angiulli, Lavon Paganini, PA-C  nystatin cream (MYCOSTATIN) Apply topically 3 (three) times daily. 05/26/17  Yes Love, Ivan Anchors, PA-C  pravastatin (PRAVACHOL) 40 MG tablet Take 1 tablet (40 mg total) by mouth daily with supper. 05/25/17  Yes Angiulli, Lavon Paganini, PA-C  traZODone (DESYREL) 50 MG tablet Take 50 mg by mouth at bedtime. 05/13/17  Yes [provider]  loperamide (IMODIUM A-D) 2 MG tablet Take 2 mg by mouth 4 (four) times daily as needed for diarrhea or loose stools.    [provider]    Physical Exam: Vitals:   05/31/17 0130 05/31/17 0145 05/31/17 0200 05/31/17 0242  BP: (!) 114/59   134/75  Pulse: 69 69 70 68  Resp: (!) 28 (!) 24 16 18   Temp:    97.8 F (36.6 C)  TempSrc:    Oral  SpO2: (!) 89% 96% 97% 95%  Weight:    95.1 kg (209 lb 9.6 oz)  Height:    6\' 1"  (1.854 m)      Constitutional: Moderately built and nourished. Vitals:   05/31/17 0130 05/31/17 0145 05/31/17 0200 05/31/17 0242  BP: (!) 114/59   134/75  Pulse: 69 69 70 68  Resp: (!) 28 (!) 24 16 18   Temp:    97.8 F (36.6 C)  TempSrc:    Oral  SpO2: (!) 89% 96% 97% 95%  Weight:    95.1 kg (209 lb 9.6 oz)  Height:    6\' 1"  (1.854 m)   Eyes: Anicteric no pallor. ENMT: No discharge from the ears eyes nose and mouth. Neck: No JVD appreciated no mass felt. Respiratory: No rhonchi or crepitations. Cardiovascular: S1-S2 heard no murmurs appreciated. Abdomen:  Nephrostomy tube seen. Soft nontender bowel sounds present. Musculoskeletal: Bilateral lower extremity edema. Skin: Small skin rashes seen. Neurologic: Alert awake oriented to time place and person. Moves all extremities. Psychiatric: Appears normal. Normal affect.   Labs on Admission: I have personally reviewed following labs and imaging studies  CBC:  Recent Labs Lab 05/26/17 0531 05/27/17 0428 06/18/2017 1614  WBC 8.6 8.8 8.2  NEUTROABS 5.9  --  5.7  HGB 10.6* 10.8* 12.2*  HCT 33.2* 33.5* 37.6*  MCV 93.5 95.2 92.8  PLT 290 279 355   Basic Metabolic Panel:  Recent Labs Lab 05/26/17 0531 06/15/2017 1614  NA 138 134*  K 3.8 4.8  CL 105 99*  CO2 28  26  GLUCOSE 84 155*  BUN 13 22*  CREATININE 1.31* 1.51*  CALCIUM 8.7* 9.5   GFR: Estimated Creatinine Clearance: 44.1 mL/min (A) (by C-G formula based on SCr of 1.51 mg/dL (H)). Liver Function Tests:  Recent Labs Lab 05/29/2017 1614  AST 36  ALT 27  ALKPHOS 98  BILITOT 0.9  PROT 5.8*  ALBUMIN 2.8*   No results for input(s): LIPASE, AMYLASE in the last 168 hours. No results for input(s): AMMONIA in the last 168 hours. Coagulation Profile: No results for input(s): INR, PROTIME in the last 168 hours. Cardiac Enzymes: No results for input(s): CKTOTAL, CKMB, CKMBINDEX, TROPONINI in the last 168 hours. BNP (last 3 results) No results for input(s): PROBNP in the last 8760 hours. HbA1C: No results for input(s): HGBA1C in the last 72 hours. CBG:  Recent Labs Lab 05/26/17 0608 05/26/17 1108 05/26/17 1620 05/26/17 2140 05/27/17 0644  GLUCAP 86 95 129* 123* 71   Lipid Profile: No results for input(s): CHOL, HDL, LDLCALC, TRIG, CHOLHDL, LDLDIRECT in the last 72 hours. Thyroid Function Tests: No results for input(s): TSH, T4TOTAL, FREET4, T3FREE, THYROIDAB in the last 72 hours. Anemia Panel: No results for input(s): VITAMINB12, FOLATE, FERRITIN, TIBC, IRON, RETICCTPCT in the last 72 hours. Urine analysis:      Component Value Date/Time   COLORURINE YELLOW 06/27/2017 2025   COLORURINE RED (A) 06/21/2017 2025   APPEARANCEUR HAZY (A) 06/12/2017 2025   APPEARANCEUR CLOUDY (A) 06/07/2017 2025   LABSPEC 1.013 06/10/2017 2025   LABSPEC 1.012 06/08/2017 2025   PHURINE 5.0 05/29/2017 2025   PHURINE 6.0 06/07/2017 2025   GLUCOSEU NEGATIVE 05/29/2017 2025   GLUCOSEU NEGATIVE 06/21/2017 2025   HGBUR LARGE (A) 06/02/2017 2025   HGBUR LARGE (A) 06/22/2017 2025   BILIRUBINUR NEGATIVE 06/13/2017 2025   BILIRUBINUR NEGATIVE 06/22/2017 2025   KETONESUR NEGATIVE 05/29/2017 2025   KETONESUR NEGATIVE 05/29/2017 2025   PROTEINUR 100 (A) 06/01/2017 2025   PROTEINUR 100 (A) 06/06/2017 2025   UROBILINOGEN 1.0 04/29/2014 1033   NITRITE NEGATIVE 06/15/2017 2025   NITRITE NEGATIVE 06/09/2017 2025   LEUKOCYTESUR SMALL (A) 06/14/2017 2025   LEUKOCYTESUR MODERATE (A) 06/08/2017 2025   Sepsis Labs: @LABRCNTIP (procalcitonin:4,lacticidven:4) )No results found for this or any previous visit (from the past 240 hour(s)).   Radiological Exams on Admission: Ct Abdomen Pelvis Wo Contrast  Result Date: 05/31/2017 CLINICAL DATA:  Back pain.  Recent nephrostomy tubes. EXAM: CT ABDOMEN AND PELVIS WITHOUT CONTRAST TECHNIQUE: Multidetector CT imaging of the abdomen and pelvis was performed following the standard protocol without IV contrast. COMPARISON:  CT 04/14/2017 FINDINGS: Lower chest: Moderate bilateral pleural effusions and adjacent compressive atelectasis. Mild cardiomegaly with coronary artery calcifications. Minimal pericardial fluid. Hepatobiliary: No evidence of focal lesion allowing for lack contrast. Calcified gallstone within the gallbladder. No biliary dilatation. Small volume of perihepatic ascites. Pancreas: Generalized mesenteric edema, including about the pancreatic head. Spleen: Normal in size without focal abnormality. Adrenals/Urinary Tract: No adrenal nodule. Bilateral nephrostomy tubes, uncal in the renal  pelvis. Bilateral ureteral stent placement with proximal pigtails in the renal pelvis, distal pigtails in the bladder. There is no hydronephrosis. Urinary bladder is completely decompressed. Minimal air in the renal collecting systems likely secondary to nephrostomy placement. No perinephric fluid collection. Nonobstructing stone in the lower left kidney is again seen. Stomach/Bowel: Stomach is physiologically distended. No obstruction, enteric contrast has reached the colon. Few scattered colonic diverticular without acute diverticulitis. Normal appendix. High-density barium within the sigmoid colon and sigmoid colonic diverticula from  prior barium swallow. No definite colonic wall thickening. Vascular/Lymphatic: Aortic and branch atherosclerosis. Limited assessment for adenopathy given lack contrast. No evidence of abdominopelvic adenopathy. Reproductive: Brachytherapy seeds in the prostate gland. Other: Small volume ala of abdominal ascites with fluid tracking in the pericolic gutters. Minimal mesenteric ascites with diffuse mesenteric edema. Ascites tracks within the left inguinal canal. No free air. No intra-abdominal abscess. Mild body wall edema. Musculoskeletal: There are no acute or suspicious osseous abnormalities. IMPRESSION: 1. Bilateral nephrostomy tubes and ureteral stents. No hydronephrosis. Urinary bladder is completely decompressed. 2. Findings consistent with fluid overload with moderate pleural effusions, small volume intra-abdominal ascites and mesenteric edema. 3. Colonic diverticulosis without acute inflammation. 4. Aortic atherosclerosis. 5. Cholelithiasis. Electronically Signed   By: Jeb Levering M.D.   On: 05/31/2017 00:36   Dg Chest 2 View  Result Date: 06/07/2017 CLINICAL DATA:  Shortness of breath for several days EXAM: CHEST  2 VIEW COMPARISON:  05/15/2017 FINDINGS: Cardiac shadow is stable. The lungs are well aerated bilaterally. Chronic blunting of the costophrenic angles is  again seen. No focal infiltrate is noted. No acute bony abnormality is noted. IMPRESSION: Chronic changes without acute abnormality. Electronically Signed   By: Inez Catalina M.D.   On: 05/28/2017 16:39    EKG: Independently reviewed. A flutter rate control. Low voltage.  Assessment/Plan Principal Problem:   Acute pulmonary edema (HCC) Active Problems:   History of prostate cancer   Atrial fibrillation (HCC)   Stage 3 chronic kidney disease   Abdominal pain   Type 2 diabetes mellitus with renal complication (HCC)   Fluid overload    1. Fluid overload/pulmonary edema - patient appears in anasarca. Patient's family would like to discuss with nephrology before initiating diuresis. Please consult nephrology in a.m. Patient is not in distress at this time. Last 2-D echo done in December 2071 showing EF of 60-65% with grade 2 diastolic dysfunction. 2. Bilateral flank pain with recent nephrostomy tube placement - CT scan does not show anything acute. Pain may be related to fluid overload. May consult patient's urologist Dr. Junious Silk in a.m. 3. Diabetes mellitus type 2 - on Levemir and I have placed patient on sliding scale coverage. 4. Paroxysmal atrial fibrillation - chads 2 vascular score more than 2. Patient is on Apixaban. 5. Normocytic normochromic anemia - follow CBC. Has had colonoscopy in 2014 which showed radiation proctitis and also diverticulosis. 6. Cholelithiasis - no signs of acute cholecystitis. Follow clinically. 7. Possible UTI - follow urine cultures. Patient is on ceftriaxone.   DVT prophylaxis: Apixaban. Code Status: Full code.  Family Communication: Patient's daughter.  Disposition Plan: To be determined.  Consults called: None.  Admission status: Inpatient.    Rise Patience MD [facility] Pager [phone].  If 7PM-7AM, please contact night-coverage [email or web address] Password [email or web address]  05/31/2017, 6:40 AM

## 2017-05-31 NOTE — Evaluation (Signed)
Physical Therapy Evaluation Patient Details Name: William [name] MRN: [id number] DOB: 02/21/1934 Today's Date: 05/31/2017   History of Present Illness  83 year old male with history of paroxysmal atrial fibrillation on anticoagulation, diabetes mellitus, grade 2 diastolic dysfunction who was admitted last month for obstructive uropathy requiring initial stents which failed and hence patient required bilateral nephrostomy tubes and was discharged to rehabilitation unit and recently discharged.  Readmitted 06/04/2017 for worsening shortness of breath and lower extremity edema; CT abdomen showing fluid overload with pleural effusion  Clinical Impression  Patient presents with generalized weakness, shortness of breath causing dependencies in gait and mobility.  Patient has good family support and was recently discharged from [place].  Feel patient will benefit from PT to progress mobility and increase independence and reduce caregiver burden.  Recommend HHPT for patient at discharge.      Follow Up Recommendations Home health PT    Equipment Recommendations  None recommended by PT    Recommendations for Other Services       Precautions / Restrictions Precautions Precautions: Fall Precaution Comments: bil nephrostomy tubes Restrictions Weight Bearing Restrictions: No      Mobility  Bed Mobility Overal bed mobility: Needs Assistance Bed Mobility: Supine to Sit;Sit to Supine     Supine to sit: Modified independent (Device/Increase time) Sit to supine: Min assist   General bed mobility comments: used railing, required assistance for LE's sit to supine.  Transfers Overall transfer level: Needs assistance Equipment used: Rolling walker (2 wheeled) Transfers: Sit to/from Stand Sit to Stand: Min guard         General transfer comment: min assist to power up from edge of bed; rocks back and forth prior to standing  Ambulation/Gait Ambulation/Gait assistance: Min assist Ambulation Distance  (Feet): 20 Feet (x 2) Assistive device: Rolling walker (2 wheeled) Gait Pattern/deviations: Step-through pattern Gait velocity: decreased      Stairs            Wheelchair Mobility    Modified Rankin (Stroke Patients Only)       Balance Overall balance assessment: Needs assistance Sitting-balance support: No upper extremity supported;Feet supported Sitting balance-Leahy Scale: Fair     Standing balance support: Bilateral upper extremity supported Standing balance-Leahy Scale: Poor Standing balance comment: reliant on RW for stability in standing                             Pertinent Vitals/Pain Pain Assessment: No/denies pain    Home Living Family/patient expects to be discharged to:: Private residence Living Arrangements: Spouse/significant other Available Help at Discharge: Family;Available 24 hours/day Type of Home: House Home Access: Stairs to enter Entrance Stairs-Rails: None Entrance Stairs-Number of Steps: 1 Home Layout: One level Home Equipment: Grab bars - tub/shower;Cane - single point;Shower seat - built in;Walker - 2 wheels Additional Comments: back door has a 10" step     Prior Function Level of Independence: Needs assistance   Gait / Transfers Assistance Needed: ambulating with supervisioin with RW, was having some recent BP issues and on Rehab, staff following with w/c.             Hand Dominance   Dominant Hand: Left    Extremity/Trunk Assessment   Upper Extremity Assessment Upper Extremity Assessment: Generalized weakness    Lower Extremity Assessment Lower Extremity Assessment: Generalized weakness    Cervical / Trunk Assessment Cervical / Trunk Assessment: Kyphotic  Communication   Communication: [facility]  Cognition Arousal/Alertness: Awake/alert Behavior During Therapy: WFL for tasks assessed/performed Overall Cognitive Status: Within Functional Limits for tasks assessed                                         General Comments      Exercises     Assessment/Plan    PT Assessment Patient needs continued PT services  PT Problem List Decreased strength;Decreased activity tolerance;Decreased balance;Decreased mobility;Decreased knowledge of use of DME;Decreased knowledge of precautions       PT Treatment Interventions DME instruction;Gait training;Stair training;Functional mobility training;Therapeutic activities;Therapeutic exercise;Patient/family education;Balance training    PT Goals (Current goals can be found in the Care Plan section)  Acute Rehab PT Goals Patient Stated Goal: back to normal PT Goal Formulation: With patient/family Time For Goal Achievement: 06/07/17 Potential to Achieve Goals: Good    Frequency Min 3X/week   Barriers to discharge        Co-evaluation               AM-PAC PT "6 Clicks" Daily Activity  Outcome Measure Difficulty turning over in bed (including adjusting bedclothes, sheets and blankets)?: A Little Difficulty moving from lying on back to sitting on the side of the bed? : A Little Difficulty sitting down on and standing up from a chair with arms (e.g., wheelchair, bedside commode, etc,.)?: A Little Help needed moving to and from a bed to chair (including a wheelchair)?: A Little Help needed walking in hospital room?: A Little Help needed climbing 3-5 steps with a railing? : A Lot 6 Click Score: 17    End of Session Equipment Utilized During Treatment: Gait belt Activity Tolerance: Patient tolerated treatment well Patient left: in bed;with call bell/phone within reach;with family/visitor present   PT Visit Diagnosis: Unsteadiness on feet (R26.81);Other abnormalities of gait and mobility (R26.89);Muscle weakness (generalized) (M62.81)    Time: 3500-9381 PT Time Calculation (min) (ACUTE ONLY): 22 min   Charges:   PT Evaluation $PT Eval Moderate Complexity: 1 Procedure     PT G Codes:        2017/06/22 [facility],  PT [phone]    Shanna Cisco 06-22-17, 2:56 PM

## 2017-05-31 NOTE — Progress Notes (Addendum)
Initial Nutrition Assessment  DOCUMENTATION CODES:   Severe malnutrition in context of chronic illness  INTERVENTION:   Ensure Enlive po TID, each supplement provides 350 kcal and 20 grams of protein with plans to switch to Glucerna Shake if pt does not tolerate ensure. Serve at room temperature.   Reviewed menu and appropriate protein choices to meet pt preferences  Appetite stimulant (family reports will be ordered by MD)  Magic cup with meals (orange creme only)  Liberalized diet, ok for food from home (discussed with MD)  Recommend consider short term feeding tube placement if intake does not improve with these measures.     NUTRITION DIAGNOSIS:   Malnutrition (Severe) related to chronic illness, poor appetite (CHF) as evidenced by severe depletion of body fat, severe depletion of muscle mass, energy intake < or equal to 75% for > or equal to 1 month.  GOAL:   Patient will meet greater than or equal to 90% of their needs  MONITOR:   PO intake, Supplement acceptance, I & O's, Weight trends  REASON FOR ASSESSMENT:   Malnutrition Screening Tool    ASSESSMENT:   Pt with PMH of severe malnutrition, DM, CHF, PAfib recent admission 6/18 for obstructive uropathy with nephrostomy tube placement d/c'ed to CIR and then home 7/1 admitted with acute pulmonary edema/fluid overload/anasarca.    Nephrology consult pending   Spoke with pt's wife and daughter. They report continued poor appetite and taste changes. He does better with cold foods but room temperature liquids. He tolerates Glucerna Shake but intake is limited. No overt problems chewing and swallowing per family. Does avoid straws and large gulps.  Encouraged cold protein foods. Whole fat greek yogurt, milk, boiled eggs, egg/tuna/chicken salad Wife reports 50 lb weight loss, this is masked in weight hx due to severe LE edema.  Discussed plan and recommendations with family. Pt slept during most of interview with family.   Nutrition-Focused physical exam completed. Findings are severe fat depletion, severe muscle depletion, and severe LE edema.    Medications reviewed and include: Ensure Enlive BID, levemir, novolog, rena-vit  Labs reviewed CBG's: 71-111-76 Lab Results  Component Value Date   HGBA1C 6.8 (H) 10/27/2016     Diet Order:  Diet regular Room service appropriate? Yes; Fluid consistency: Thin; Fluid restriction: 1200 mL Fluid  Skin:   (MASD buttocks/scrotum, incision on back and perineum)  Last BM:  7/3  Height:   Ht Readings from Last 1 Encounters:  05/31/17 6\' 1"  (1.854 m)    Weight:   Wt Readings from Last 1 Encounters:  05/31/17 209 lb 9.6 oz (95.1 kg)    Ideal Body Weight:  83.6 kg  BMI:  Body mass index is 27.65 kg/m.  Estimated Nutritional Needs:   Kcal:  2200-2400  Protein:  110-125 grams  Fluid:  1.2 L/day  EDUCATION NEEDS:   Education needs addressed  Maylon Peppers RD, [place], [place] Pager [phone] After Hours Pager

## 2017-05-31 NOTE — Progress Notes (Signed)
Patient ID: William [name], male   DOB: 02/27/1934, 83 y.o.   MRN: [id number] HPI:  William [name] is an 83yo WM with PMH signficant for HTN, DM, prostate s/p XRT, A fib (on apixaban), OSA, CVA, and CKD stage 2 (Scr 0.93-1.2) who was seen in consultation on 05/08/17 with FTT and ARF due to obstructive uropathy s/p bilateral percutaneous nephrostomy tubes.  Please refer to Dr. Deterding's initial consultation on that date for full details. His Scr peaked at 5.29 but improved to 1.23 at time of discharge from CIR on 05/27/17.  He was home for only a few days when his family brought him back to [facility] today with 4 day history of increasing SOB and lower extremity edema.  His wife also reports symptoms consistent with PND and orthopnea.  He was found to have signficant volume overload and we were re-consulted to evaluate if this was related to his kidneys.  CT scan of his abdomen and pelvis did not show hydronephrosis and PNT's were functioning.  He was also found to have evidence of pleural effusions, ascites, and mesenteric edema.  His family was reluctant to start diuretics without a Nephrology evaluation.  He does not have a history of nephrotic range proteinuria but has had anorexia and severe protein malnutrition.  His weight is up 12 lbs since discharge from CIR.     O:BP 134/75 (BP Location: Left Arm)   Pulse 68   Temp 97.8 F (36.6 C) (Oral)   Resp 18   Ht 6\' 1"  (1.854 m)   Wt 95.1 kg (209 lb 9.6 oz)   SpO2 95%   BMI 27.65 kg/m   Intake/Output Summary (Last 24 hours) at 05/31/17 1430 Last data filed at 05/31/17 0800  Gross per 24 hour  Intake             2240 ml  Output              365 ml  Net             1875 ml   Intake/Output: I/O last 3 completed shifts: In: 2000 [IV Piggyback:2000] Out: 365 [Drains:365]  Intake/Output this shift:  Total I/O In: 240 [P.O.:240] Out: -  Weight change:  Gen:WD WM in NAD, somnolent but arousable CVS: no rub Resp:decreased bs at bases bilaterally  Abd:+BS,  soft, NT Ext:1 + pitting edema bilateral lower extremities   Recent Labs Lab 05/26/17 0531 06/11/2017 1614 05/31/17 0737  NA 138 134* 135  K 3.8 4.8 4.6  CL 105 99* 103  CO2 28 26 27   GLUCOSE 84 155* 99  BUN 13 22* 19  CREATININE 1.31* 1.51* 1.36*  ALBUMIN  --  2.8* 2.3*  CALCIUM 8.7* 9.5 8.6*  AST  --  36 27  ALT  --  27 22   Liver Function Tests:  Recent Labs Lab 06/10/2017 1614 05/31/17 0737  AST 36 27  ALT 27 22  ALKPHOS 98 81  BILITOT 0.9 0.9  PROT 5.8* 5.0*  ALBUMIN 2.8* 2.3*   No results for input(s): LIPASE, AMYLASE in the last 168 hours. No results for input(s): AMMONIA in the last 168 hours. CBC:  Recent Labs Lab 05/26/17 0531 05/27/17 0428 06/07/2017 1614 05/31/17 0737  WBC 8.6 8.8 8.2 5.8  NEUTROABS 5.9  --  5.7 3.8  HGB 10.6* 10.8* 12.2* 10.9*  HCT 33.2* 33.5* 37.6* 34.2*  MCV 93.5 95.2 92.8 94.0  PLT 290 279 249 222   Cardiac Enzymes:  Recent Labs  Lab 05/31/17 0737 05/31/17 1140  TROPONINI 0.08* 0.07*   CBG:  Recent Labs Lab 05/26/17 2140 05/27/17 0644 06/24/2017 2017 05/31/17 0629 05/31/17 1115  GLUCAP 123* 71 111* 76 87    Iron Studies: No results for input(s): IRON, TIBC, TRANSFERRIN, FERRITIN in the last 72 hours. Studies/Results: Ct Abdomen Pelvis Wo Contrast  Result Date: 05/31/2017 CLINICAL DATA:  Back pain.  Recent nephrostomy tubes. EXAM: CT ABDOMEN AND PELVIS WITHOUT CONTRAST TECHNIQUE: Multidetector CT imaging of the abdomen and pelvis was performed following the standard protocol without IV contrast. COMPARISON:  CT 04/14/2017 FINDINGS: Lower chest: Moderate bilateral pleural effusions and adjacent compressive atelectasis. Mild cardiomegaly with coronary artery calcifications. Minimal pericardial fluid. Hepatobiliary: No evidence of focal lesion allowing for lack contrast. Calcified gallstone within the gallbladder. No biliary dilatation. Small volume of perihepatic ascites. Pancreas: Generalized mesenteric edema, including  about the pancreatic head. Spleen: Normal in size without focal abnormality. Adrenals/Urinary Tract: No adrenal nodule. Bilateral nephrostomy tubes, uncal in the renal pelvis. Bilateral ureteral stent placement with proximal pigtails in the renal pelvis, distal pigtails in the bladder. There is no hydronephrosis. Urinary bladder is completely decompressed. Minimal air in the renal collecting systems likely secondary to nephrostomy placement. No perinephric fluid collection. Nonobstructing stone in the lower left kidney is again seen. Stomach/Bowel: Stomach is physiologically distended. No obstruction, enteric contrast has reached the colon. Few scattered colonic diverticular without acute diverticulitis. Normal appendix. High-density barium within the sigmoid colon and sigmoid colonic diverticula from prior barium swallow. No definite colonic wall thickening. Vascular/Lymphatic: Aortic and branch atherosclerosis. Limited assessment for adenopathy given lack contrast. No evidence of abdominopelvic adenopathy. Reproductive: Brachytherapy seeds in the prostate gland. Other: Small volume ala of abdominal ascites with fluid tracking in the pericolic gutters. Minimal mesenteric ascites with diffuse mesenteric edema. Ascites tracks within the left inguinal canal. No free air. No intra-abdominal abscess. Mild body wall edema. Musculoskeletal: There are no acute or suspicious osseous abnormalities. IMPRESSION: 1. Bilateral nephrostomy tubes and ureteral stents. No hydronephrosis. Urinary bladder is completely decompressed. 2. Findings consistent with fluid overload with moderate pleural effusions, small volume intra-abdominal ascites and mesenteric edema. 3. Colonic diverticulosis without acute inflammation. 4. Aortic atherosclerosis. 5. Cholelithiasis. Electronically Signed   By: Jeb Levering M.D.   On: 05/31/2017 00:36   Dg Chest 2 View  Result Date: 06/10/2017 CLINICAL DATA:  Shortness of breath for several days  EXAM: CHEST  2 VIEW COMPARISON:  05/15/2017 FINDINGS: Cardiac shadow is stable. The lungs are well aerated bilaterally. Chronic blunting of the costophrenic angles is again seen. No focal infiltrate is noted. No acute bony abnormality is noted. IMPRESSION: Chronic changes without acute abnormality. Electronically Signed   By: Inez Catalina M.D.   On: 06/17/2017 16:39   . amLODipine  5 mg Oral QHS  . apixaban  5 mg Oral BID  . feeding supplement (ENSURE ENLIVE)  237 mL Oral TID BM  . furosemide  60 mg Intravenous Q12H  . insulin aspart  0-9 Units Subcutaneous TID WC  . insulin detemir  6 Units Subcutaneous QHS  . multivitamin  1 tablet Oral QHS  . pravastatin  40 mg Oral Q supper  . traZODone  50 mg Oral QHS    BMET    Component Value Date/Time   NA 135 05/31/2017 0737   K 4.6 05/31/2017 0737   CL 103 05/31/2017 0737   CO2 27 05/31/2017 0737   GLUCOSE 99 05/31/2017 0737   BUN 19 05/31/2017 0737  CREATININE 1.36 (H) 05/31/2017 0737   CALCIUM 8.6 (L) 05/31/2017 0737   GFRNONAA 48 (L) 05/31/2017 0737   GFRAA 55 (L) 05/31/2017 0737   CBC    Component Value Date/Time   WBC 5.8 05/31/2017 0737   RBC 3.64 (L) 05/31/2017 0737   HGB 10.9 (L) 05/31/2017 0737   HCT 34.2 (L) 05/31/2017 0737   HCT 39.5 05/11/2017 0959   PLT 222 05/31/2017 0737   MCV 94.0 05/31/2017 0737   MCH 29.9 05/31/2017 0737   MCHC 31.9 05/31/2017 0737   RDW 15.5 05/31/2017 0737   LYMPHSABS 0.7 05/31/2017 0737   MONOABS 1.0 05/31/2017 0737   EOSABS 0.2 05/31/2017 0737   BASOSABS 0.0 05/31/2017 0737     Assessment/Plan:  1. Volume overload- does not appear to be related to Nephrotic syndrome or recurrent renal failure.  Agree with cardiac workup as well as liver disease.  He would also benefit from protein supplementation due to his hypoalbuminemia which may be contributing to his anasarca.  Will also check TSH and await ECHO results (does have history of diastolic dysfunction) 1. Agree with diuretics and  follow I's/O's and daily weights 2. Will check UPEP for completeness 2. AKI/CKD- due to bilateral obstructive uropathy s/p bilateral PNT placement.  Functioning well. 3. Protein malnutrition- supplement 4. DM type 2 - per primary svc 5. HTN- stable 6. A fib 7. Obstructive uropathy- PNT's patent, f/u with Urology and IR 8. Anemia of chronic disease 9. Cholelithiasis  10. FTT 11. Deconditioning- just discharged from CIR 4 days ago.  Nsg to assess PT needs  Donetta Potts, MD [facility] [phone]

## 2017-05-31 NOTE — Progress Notes (Signed)
Patient assisted to chair, for supper will monitor patient. William [name], Bettina Gavia RN

## 2017-05-31 NOTE — Progress Notes (Addendum)
Upon RN assessment this am noticed rash on back abdomen and groin area, some dry areas some with appearance of pustules, Sacrum purple/ red in appearance with foam dressing in place. Per family rash started yesterday and sacrum is getting better. Sacrum red/purple does blanch present on admission  Karinda Cabriales, Bettina Gavia RN

## 2017-05-31 NOTE — Evaluation (Signed)
Clinical/Bedside Swallow Evaluation Patient Details  Name: JONATHANDAVID MARLETT MRN: [id number] Date of Birth: 1934/02/02  Today's Date: 05/31/2017 Time: SLP Start Time (ACUTE ONLY): 40 SLP Stop Time (ACUTE ONLY): 1340 SLP Time Calculation (min) (ACUTE ONLY): 20 min  Past Medical History:  Past Medical History:  Diagnosis Date  . Arthritis   . Diabetes mellitus without complication ([place])   . Hypercholesteremia   . Hypertension   . Kidney stones   . Prostate cancer [facility])    with radiation treatment  . Sleep apnea 2/14   "mild per patient" hasnt gotten CPAP machine  . Stroke [facility])    Past Surgical History:  Past Surgical History:  Procedure Laterality Date  . ADENOIDECTOMY    . CYSTOSCOPY W/ URETERAL STENT PLACEMENT Bilateral 05/07/2017   Procedure: CYSTOSCOPY WITH BILATERAL RETROGRADE PYELOGRAM CYSTOGRAM BILATERAL URETERAL STENT PLACEMENT;  Surgeon: Festus Aloe, MD;  Location: [place];  Service: Urology;  Laterality: Bilateral;  . FLEXIBLE SIGMOIDOSCOPY N/A 09/12/2013   Procedure: FLEXIBLE SIGMOIDOSCOPY;  Surgeon: Milus Banister, MD;  Location: [facility];  Service: Endoscopy;  Laterality: N/A;  . HERNIA REPAIR    . HOT HEMOSTASIS N/A 09/12/2013   Procedure: HOT HEMOSTASIS (ARGON PLASMA COAGULATION/BICAP);  Surgeon: Milus Banister, MD;  Location: [facility];  Service: Endoscopy;  Laterality: N/A;  . IR NEPHROSTOMY PLACEMENT LEFT  05/09/2017  . IR NEPHROSTOMY PLACEMENT RIGHT  05/09/2017  . LIPOMA EXCISION    . OTHER SURGICAL HISTORY     s/p prostate radiation  . TONSILLECTOMY    . TOTAL KNEE ARTHROPLASTY Left 03/25/2014   Procedure: LEFT TOTAL KNEE ARTHROPLASTY;  Surgeon: [name], MD;  Location: [facility];  Service: Orthopedics;  Laterality: Left;  . TOTAL KNEE ARTHROPLASTY Right 05/06/2014   Procedure: RIGHT TOTAL KNEE ARTHROPLASTY;  Surgeon: [name], MD;  Location: [facility];  Service: Orthopedics;  Laterality: Right;   HPI:  83 year old male with history of  paroxysmal atrial fibrillation on anticoagulation, diabetes mellitus, grade 2 diastolic dysfunction who was admitted last month for obstructive uropathy requiring initial stents which failed and hence patient required bilateral nephrostomy tubes (6/09); was discharged to rehabilitation unit until 6/30. Readmitted 7/03 for worsening shortness of breath and lower extremity edema; CT abdomen showing fluid overload with pleural effusion. Dx fluid overload/pulmonary edema.  Hx of dysphagia.  MBS during CIR stay 6/22 revealed moderate dysphagia with delays, sensed aspiration of thin liquids. Repeat study 6/28 - improved swallow with persistent throat-clearing which did not correlate with airway invasion.  Diet was advanced to regular, thin liquids at that time.    Assessment / Plan / Recommendation Clinical Impression  Pt presents with baseline swallow function - he appears to be protecting his airway, with clinical findings c/w description of most recent MBS.  There is adequate mastication, consistent swallow response per palpation, and no overt s/s of aspiration.  Lungs are well aerated per yesterday's CXR.  Breath sounds are clear.  Pt fatigues easily, so he should take frequent rest breaks during meals, as well as eat nutrient-dense foods early in meal.  Continue regular diet, thin liquids; avoid straws; give meds whole in puree.  D/W pt, wife.  No further acute care SLP needs are identified - our services will sign off.  SLP Visit Diagnosis: Dysphagia, unspecified (R13.10)    Aspiration Risk  Mild aspiration risk    Diet Recommendation     Medication Administration: Whole meds with puree    Other  Recommendations Oral  Care Recommendations: Oral care BID   Follow up Recommendations None      Frequency and Duration            Prognosis        Swallow Study   General HPI: 83 year old male with history of paroxysmal atrial fibrillation on anticoagulation, diabetes mellitus, grade 2 diastolic  dysfunction who was admitted last month for obstructive uropathy requiring initial stents which failed and hence patient required bilateral nephrostomy tubes (6/09); was discharged to rehabilitation unit until 6/30. Readmitted 7/03 for worsening shortness of breath and lower extremity edema; CT abdomen showing fluid overload with pleural effusion. Dx fluid overload/pulmonary edema.  Hx of dysphagia.  MBS during CIR stay 6/22 revealed moderate dysphagia with delays, sensed aspiration of thin liquids. Repeat study 6/28 - improved swallow with persistent throat-clearing which did not correlate with airway invasion.  Diet was advanced to regular, thin liquids at that time.  Type of Study: Bedside Swallow Evaluation Previous Swallow Assessment:  (see HPI) Diet Prior to this Study: Regular;Thin liquids Temperature Spikes Noted: No Respiratory Status: Room air History of Recent Intubation: No Behavior/Cognition: Alert;Cooperative;Pleasant mood Oral Cavity Assessment: Within Functional Limits Oral Care Completed by SLP: No Oral Cavity - Dentition: Adequate natural dentition Vision: Functional for self-feeding Self-Feeding Abilities: Able to feed self Patient Positioning: Upright in bed Baseline Vocal Quality: Normal Volitional Cough: Strong Volitional Swallow: Able to elicit    Oral/Motor/Sensory Function Overall Oral Motor/Sensory Function: Within functional limits   Ice Chips Ice chips: Not tested   Thin Liquid Thin Liquid: Within functional limits Presentation: Cup    Nectar Thick Nectar Thick Liquid: Not tested   Honey Thick Honey Thick Liquid: Not tested   Puree Puree: Within functional limits   Solid   GO   Solid: Within functional limits        Juan Quam Laurice 05/31/2017,2:00 PM

## 2017-05-31 NOTE — Progress Notes (Addendum)
Pharmacy Antibiotic Note  William [name] is a 83 y.o. male admitted on 05/29/2017 with UTI.  Pharmacy has been consulted for Rocephin dosing.  Plan: Rocephin 1g IV Q24H.  Pharmacy will sign off.  Height: 6\' 1"  (185.4 cm) Weight: 209 lb 9.6 oz (95.1 kg) IBW/kg (Calculated) : 79.9  Temp (24hrs), Avg:97.8 F (36.6 C), Min:97.7 F (36.5 C), Max:97.8 F (36.6 C)   Recent Labs Lab 05/26/17 0531 05/27/17 0428 06/22/2017 1614 06/07/2017 1630  WBC 8.6 8.8 8.2  --   CREATININE 1.31*  --  1.51*  --   LATICACIDVEN  --   --   --  1.97*    Estimated Creatinine Clearance: 44.1 mL/min (A) (by C-G formula based on SCr of 1.51 mg/dL (H)).    Allergies  Allergen Reactions  . Glyburide Other (See Comments)    Hypoglycemia      Thank you for allowing pharmacy to be a part of this patient's care.  Wynona Neat, PharmD, BCPS  05/31/2017 7:36 AM

## 2017-06-01 ENCOUNTER — Inpatient Hospital Stay (HOSPITAL_COMMUNITY): Payer: Medicare Other

## 2017-06-01 ENCOUNTER — Other Ambulatory Visit (HOSPITAL_COMMUNITY): Payer: Medicare Other

## 2017-06-01 DIAGNOSIS — M7989 Other specified soft tissue disorders: Secondary | ICD-10-CM

## 2017-06-01 DIAGNOSIS — M545 Low back pain, unspecified: Secondary | ICD-10-CM

## 2017-06-01 DIAGNOSIS — R627 Adult failure to thrive: Secondary | ICD-10-CM

## 2017-06-01 DIAGNOSIS — Z7189 Other specified counseling: Secondary | ICD-10-CM

## 2017-06-01 DIAGNOSIS — Z515 Encounter for palliative care: Secondary | ICD-10-CM

## 2017-06-01 LAB — COMPREHENSIVE METABOLIC PANEL
ALBUMIN: 2.4 g/dL — AB (ref 3.5–5.0)
ALT: 24 U/L (ref 17–63)
ANION GAP: 9 (ref 5–15)
AST: 31 U/L (ref 15–41)
Alkaline Phosphatase: 88 U/L (ref 38–126)
BUN: 21 mg/dL — ABNORMAL HIGH (ref 6–20)
CHLORIDE: 99 mmol/L — AB (ref 101–111)
CO2: 26 mmol/L (ref 22–32)
Calcium: 8.9 mg/dL (ref 8.9–10.3)
Creatinine, Ser: 1.45 mg/dL — ABNORMAL HIGH (ref 0.61–1.24)
GFR calc Af Amer: 51 mL/min — ABNORMAL LOW (ref >60)
GFR calc non Af Amer: 44 mL/min — ABNORMAL LOW (ref >60)
GLUCOSE: 201 mg/dL — AB (ref 65–99)
POTASSIUM: 3.9 mmol/L (ref 3.5–5.1)
SODIUM: 134 mmol/L — AB (ref 135–145)
Total Bilirubin: 0.8 mg/dL (ref 0.3–1.2)
Total Protein: 5.2 g/dL — ABNORMAL LOW (ref 6.5–8.1)

## 2017-06-01 LAB — CBC WITH DIFFERENTIAL/PLATELET
BASOS PCT: 1 %
Basophils Absolute: 0 10*3/uL (ref 0.0–0.1)
EOS ABS: 0.2 10*3/uL (ref 0.0–0.7)
EOS PCT: 3 %
HCT: 35.2 % — ABNORMAL LOW (ref 39.0–52.0)
HEMOGLOBIN: 11.3 g/dL — AB (ref 13.0–17.0)
Lymphocytes Relative: 11 %
Lymphs Abs: 0.7 10*3/uL (ref 0.7–4.0)
MCH: 30.3 pg (ref 26.0–34.0)
MCHC: 32.1 g/dL (ref 30.0–36.0)
MCV: 94.4 fL (ref 78.0–100.0)
MONO ABS: 1.3 10*3/uL — AB (ref 0.1–1.0)
MONOS PCT: 22 %
NEUTROS PCT: 63 %
Neutro Abs: 3.9 10*3/uL (ref 1.7–7.7)
PLATELETS: 226 10*3/uL (ref 150–400)
RBC: 3.73 MIL/uL — ABNORMAL LOW (ref 4.22–5.81)
RDW: 15.5 % (ref 11.5–15.5)
WBC: 6.1 10*3/uL (ref 4.0–10.5)

## 2017-06-01 LAB — GLUCOSE, CAPILLARY
GLUCOSE-CAPILLARY: 145 mg/dL — AB (ref 65–99)
Glucose-Capillary: 125 mg/dL — ABNORMAL HIGH (ref 65–99)
Glucose-Capillary: 152 mg/dL — ABNORMAL HIGH (ref 65–99)
Glucose-Capillary: 179 mg/dL — ABNORMAL HIGH (ref 65–99)

## 2017-06-01 LAB — T4, FREE: Free T4: 1.08 ng/dL (ref 0.61–1.12)

## 2017-06-01 LAB — MAGNESIUM: Magnesium: 1.8 mg/dL (ref 1.7–2.4)

## 2017-06-01 MED ORDER — FUROSEMIDE 40 MG PO TABS
40.0000 mg | ORAL_TABLET | Freq: Two times a day (BID) | ORAL | Status: DC
  Administered 2017-06-01 – 2017-06-03 (×5): 40 mg via ORAL
  Filled 2017-06-01 (×5): qty 1

## 2017-06-01 MED ORDER — VALACYCLOVIR HCL 500 MG PO TABS
1000.0000 mg | ORAL_TABLET | Freq: Two times a day (BID) | ORAL | Status: DC
  Administered 2017-06-01 – 2017-06-03 (×5): 1000 mg via ORAL
  Filled 2017-06-01 (×5): qty 2

## 2017-06-01 MED ORDER — ZOLPIDEM TARTRATE 5 MG PO TABS
5.0000 mg | ORAL_TABLET | Freq: Every day | ORAL | Status: DC
  Administered 2017-06-01 – 2017-06-04 (×3): 5 mg via ORAL
  Filled 2017-06-01 (×3): qty 1

## 2017-06-01 MED ORDER — GERHARDT'S BUTT CREAM
TOPICAL_CREAM | Freq: Two times a day (BID) | CUTANEOUS | Status: DC
  Administered 2017-06-01: 22:00:00 via TOPICAL
  Administered 2017-06-01: 1 via TOPICAL
  Administered 2017-06-02 – 2017-06-03 (×3): via TOPICAL
  Filled 2017-06-01: qty 1

## 2017-06-01 MED ORDER — TRAZODONE HCL 100 MG PO TABS
100.0000 mg | ORAL_TABLET | Freq: Every day | ORAL | Status: DC
  Administered 2017-06-01 – 2017-06-04 (×3): 100 mg via ORAL
  Filled 2017-06-01 (×3): qty 1

## 2017-06-01 NOTE — NC FL2 (Signed)
[place] LEVEL OF CARE SCREENING TOOL     IDENTIFICATION  Patient Name: William [name] Birthdate: November 15, 1934 Sex: male Admission Date (Current Location): 06/13/2017  [facility] and [place] Number:  Herbalist and Address:  [facility] [place], [place] [address], [place], [place] [address]      Provider Number: [id number]  Attending Physician Name and Address:  Aline August, MD  Relative Name and Phone Number:       Current Level of Care: Hospital Recommended Level of Care: [place] Prior Approval Number:    Date Approved/Denied:   PASRR Number: [id number]  Discharge Plan: SNF    Current Diagnoses: Patient Active Problem List   Diagnosis Date Noted  . Abdominal pain 05/31/2017  . Acute pulmonary edema ([place]) 05/31/2017  . Type 2 diabetes mellitus with renal complication ([place]) 63/33/5456  . Fluid overload 05/31/2017  . Stage 3 chronic kidney disease   . Nephrostomy tube bleed ([place])   . Anxiety state   . Sleep disturbance   . Labile blood glucose   . Debilitated 05/13/2017  . Protein-calorie malnutrition, severe 05/10/2017  . History of prostate cancer   . Hydronephrosis, bilateral   . AKI (acute kidney injury) ([place])   . Diabetes mellitus type 2 in nonobese (H[facility])   . Atrial fibrillation ([place])   . Benign essential HTN   . Leukocytosis   . Acute blood loss anemia   . Hydronephrosis   . Acute renal failure (ARF) ([place]) 05/06/2017  . Acute lower UTI 05/06/2017  . Occult GI bleeding 05/06/2017  . Stroke ([place]) 10/26/2016  . Obese 05/07/2014  . Expected blood loss anemia 05/07/2014  . S/P right TKA 05/06/2014  . DJD (degenerative joint disease) of knee 03/25/2014  . Preop cardiovascular exam 03/19/2014  . Abnormal ECG 03/19/2014  . Hypertension   . IDDM (insulin dependent diabetes mellitus) ([place])   . Hypercholesteremia   . Arthritis   . Prostate cancer ([place])   . Sleep apnea     Orientation RESPIRATION  BLADDER Height & Weight     Self, Time, Place  O2 (2L [place]) Continent Weight: 209 lb 1.6 oz (94.8 kg) Height:  6\' 1"  (185.4 cm)  BEHAVIORAL SYMPTOMS/MOOD NEUROLOGICAL BOWEL NUTRITION STATUS      Continent Diet (regular)  AMBULATORY STATUS COMMUNICATION OF NEEDS Skin   Limited Assist Verbally Other (Comment) (back wounds requiring gauze/tape changes daily)                       Personal Care Assistance Level of Assistance  Bathing, Dressing Bathing Assistance: Limited assistance   Dressing Assistance: Limited assistance     Functional Limitations Info             SPECIAL CARE FACTORS FREQUENCY  PT (By licensed PT), OT (By licensed OT)     PT Frequency: 5/wk OT Frequency: 5/wk            Contractures      Additional Factors Info  Code Status, Allergies, Insulin Sliding Scale Code Status Info: FULL Allergies Info: Glyburide   Insulin Sliding Scale Info: 4/day       Current Medications (06/01/2017):  This is the current hospital active medication list Current Facility-Administered Medications  Medication Dose Route Frequency Provider Last Rate Last Dose  . acetaminophen (TYLENOL) tablet 650 mg  650 mg Oral Q6H PRN Rise Patience, MD   650 mg at 06/01/17 2563   Or  .  acetaminophen (TYLENOL) suppository 650 mg  650 mg Rectal Q6H PRN Rise Patience, MD      . amLODipine (NORVASC) tablet 5 mg  5 mg Oral QHS Rise Patience, MD   5 mg at 05/31/17 2234  . apixaban (ELIQUIS) tablet 5 mg  5 mg Oral BID Rise Patience, MD   5 mg at 06/01/17 1001  . cefTRIAXone (ROCEPHIN) 1 g in dextrose 5 % 50 mL IVPB  1 g Intravenous Q24H Laren Everts, RPH   Stopped at 06/01/17 0915  . feeding supplement (ENSURE ENLIVE) (ENSURE ENLIVE) liquid 237 mL  237 mL Oral TID BM Alekh, Kshitiz, MD   237 mL at 05/31/17 2000  . furosemide (LASIX) tablet 40 mg  40 mg Oral BID Donato Heinz, MD   40 mg at 06/01/17 1008  . Gerhardt's butt cream   Topical BID Alekh,  Kshitiz, MD      . insulin aspart (novoLOG) injection 0-9 Units  0-9 Units Subcutaneous TID WC Rise Patience, MD   1 Units at 06/01/17 0645  . insulin detemir (LEVEMIR) injection 6 Units  6 Units Subcutaneous QHS Rise Patience, MD   6 Units at 05/31/17 2230  . LORazepam (ATIVAN) tablet 0.5-1 mg  0.5-1 mg Oral QHS PRN Rise Patience, MD   1 mg at 06/01/17 0035  . methocarbamol (ROBAXIN) tablet 500 mg  500 mg Oral BID PRN Rise Patience, MD   500 mg at 06/01/17 0845  . morphine 2 MG/ML injection 1 mg  1 mg Intravenous Q4H PRN Rise Patience, MD   1 mg at 05/31/17 2247  . multivitamin (RENA-VIT) tablet 1 tablet  1 tablet Oral QHS Rise Patience, MD   1 tablet at 05/31/17 2233  . ondansetron (ZOFRAN) tablet 4 mg  4 mg Oral Q6H PRN Rise Patience, MD       Or  . ondansetron [facility]) injection 4 mg  4 mg Intravenous Q6H PRN Rise Patience, MD   4 mg at 05/31/17 2247  . pravastatin (PRAVACHOL) tablet 40 mg  40 mg Oral Q supper Rise Patience, MD   40 mg at 05/31/17 1727  . traZODone (DESYREL) tablet 50 mg  50 mg Oral QHS Rise Patience, MD   50 mg at 05/31/17 2234     Discharge Medications: Please see discharge summary for a list of discharge medications.  Relevant Imaging Results:  Relevant Lab Results:   Additional Information SS#: [id number]  Jorge Ny, LCSW

## 2017-06-01 NOTE — Consult Note (Signed)
Consultation Note Date: 06/01/2017   Patient Name: William [name]  DOB: 1934-07-20  MRN: [id number]  Age / Sex: 83 y.o., male  PCP: Jani Gravel, MD Referring Physician: Aline August, MD  Reason for Consultation: Establishing goals of care and Psychosocial/spiritual support  HPI/Patient Profile: 83 y.o. male   admitted on 06/24/2017 with past medical history of paroxysmal atrial fibrillation, diabetes mellitus, grade 2 diastolic dysfunction, prostrate cancer, CKD h/o cva who was recently admitted last month for obstructive uropathy requiring initially stents but since stents failed patient had nephrostomy tube placed and was discharged to [place] rehabilitation.  Was home only three days and returned to ER 2/2 to worsening shortness of breath and increasing peripheral edema.   Family report slow continued physical and functional decline over the past 3 months.  Patient is failing to thrive with significant weight loss, poor appetite and multiple re hospitalizations.    Family face treatment option decisions, advanced directive decsions and anticipatory care needs.  Clinical Assessment and Goals of Care:  This NP Wadie Lessen reviewed medical records, received report from team, assessed the patient and then meet at the patient's bedside along with his wife and two daughters to discuss diagnosis, prognosis, GOC, and options.  A detailed discussion was had today regarding advanced directives.  Concepts specific to code status, artifical feeding and hydration, continued IV antibiotics and rehospitalization was had.  The difference between a aggressive medical intervention path  and a palliative comfort care path for this patient at this time was had.  Values and goals of care important to patient and family were attempted to be elicited.  We discussed the concept of failure to thrive and I expressed my concern for high  risk of decompensation.  Discussed with patient and his family the importance of continued conversation with family and their  medical providers regarding overall plan of care and treatment options,  ensuring decisions are within the context of the patients values and GOCs.  Questions and concerns addressed.   Family encouraged to call with questions or concerns.  PMT will continue to support holistically.   HCPOA/ wife/ see documents in EMR    SUMMARY OF RECOMMENDATIONS    Code Status/Advance Care Planning:  Full code Explained in detail the concept of limited code while here in the hospital, wife cannot make shift to put any limits on medical intervetnions or treatments at this time.  Symptom Management:   Weakness: PT/OT as tolerated  Insomnia: encouraged family to stimulate and engage during daytime hours, open blinds, active/pasive ROM exercise             Consider low dose of Zyprexa 2.5 mg qhs/discussed with Dr Starla Link  Palliative Prophylaxis:   Aspiration, Bowel Regimen, Frequent Pain Assessment and Oral Care  Additional Recommendations (Limitations, Scope, Preferences):  Full Scope Treatment  Psycho-social/Spiritual:   Desire for further [place]  Emotional support offered  Prognosis:   Unable to determine  Discharge Planning: To Be Determined      Primary Diagnoses:  Present on Admission: . Abdominal pain . Acute pulmonary edema (HCC) . Atrial fibrillation ([place]) . Stage 3 chronic kidney disease . Type 2 diabetes mellitus with renal complication (HCC) . Fluid overload   I have reviewed the medical record, interviewed the patient and family, and examined the patient. The following aspects are pertinent.  Past Medical History:  Diagnosis Date  . Arthritis   . Diabetes mellitus without complication ([place])   . Hypercholesteremia   . Hypertension   . Kidney stones   . Prostate cancer [facility])    with radiation treatment  .  Sleep apnea 2/14   "mild per patient" hasnt gotten CPAP machine  . Stroke [facility])    Social History   Social History  . Marital status: Married    Spouse name: N/A  . Number of children: N/A  . Years of education: N/A   Social History Main Topics  . Smoking status: Former Smoker    Start date: 11/28/1982  . Smokeless tobacco: Never Used  . Alcohol use 0.6 oz/week    1 Shots of liquor per week     Comment: occasionally  . Drug use: No  . Sexual activity: Not Asked   Other Topics Concern  . None   Social History Narrative  . None   Family History  Problem Relation Age of Onset  . Colon cancer Father   . Stroke Maternal Grandmother   . Esophageal cancer Neg Hx   . Rectal cancer Neg Hx   . Stomach cancer Neg Hx    Scheduled Meds: . amLODipine  5 mg Oral QHS  . apixaban  5 mg Oral BID  . feeding supplement (ENSURE ENLIVE)  237 mL Oral TID BM  . furosemide  40 mg Oral BID  . Gerhardt's butt cream   Topical BID  . insulin aspart  0-9 Units Subcutaneous TID WC  . insulin detemir  6 Units Subcutaneous QHS  . multivitamin  1 tablet Oral QHS  . pravastatin  40 mg Oral Q supper  . traZODone  100 mg Oral QHS  . valACYclovir  1,000 mg Oral BID  . zolpidem  5 mg Oral QHS   Continuous Infusions: . cefTRIAXone (ROCEPHIN)  IV Stopped (06/01/17 0915)   PRN Meds:.acetaminophen **OR** acetaminophen, LORazepam, methocarbamol, morphine injection, ondansetron **OR** ondansetron (ZOFRAN) IV Medications Prior to Admission:  Prior to Admission medications   Medication Sig Start Date End Date Taking? Authorizing Provider  acetaminophen (TYLENOL) 325 MG tablet Take 325 mg by mouth every 6 (six) hours as needed for mild pain.    Yes [provider]  amLODipine (NORVASC) 5 MG tablet Take 1 tablet (5 mg total) by mouth at bedtime. 05/25/17  Yes Angiulli, Lavon Paganini, PA-C  apixaban (ELIQUIS) 5 MG TABS tablet Take 1 tablet (5 mg total) by mouth 2 (two) times daily. 05/25/17  Yes Angiulli,  Lavon Paganini, PA-C  LEVEMIR FLEXTOUCH 100 UNIT/ML Pen Inject 6 Units into the skin at bedtime. 05/26/17  Yes Love, Ivan Anchors, PA-C  LORazepam (ATIVAN) 1 MG tablet Take 0.5-1 tablets (0.5-1 mg total) by mouth at bedtime as needed for anxiety or sleep. 05/26/17  Yes Love, Ivan Anchors, PA-C  methocarbamol (ROBAXIN) 500 MG tablet Take 1 tablet (500 mg total) by mouth 2 (two) times daily as needed for muscle spasms. 05/25/17  Yes Angiulli, Lavon Paganini, PA-C  multivitamin (RENA-VIT) TABS tablet Take 1 tablet by mouth at bedtime. 05/25/17  Yes Angiulli, Lavon Paganini, PA-C  nystatin cream (MYCOSTATIN) Apply  topically 3 (three) times daily. 05/26/17  Yes Love, Ivan Anchors, PA-C  pravastatin (PRAVACHOL) 40 MG tablet Take 1 tablet (40 mg total) by mouth daily with supper. 05/25/17  Yes Angiulli, Lavon Paganini, PA-C  traZODone (DESYREL) 50 MG tablet Take 50 mg by mouth at bedtime. 05/13/17  Yes [provider]  loperamide (IMODIUM A-D) 2 MG tablet Take 2 mg by mouth 4 (four) times daily as needed for diarrhea or loose stools.    [provider]   Allergies  Allergen Reactions  . Glyburide Other (See Comments)    Hypoglycemia    Review of Systems  Musculoskeletal: Positive for back pain.  Neurological: Positive for weakness.    Physical Exam  Constitutional: He appears lethargic. He appears cachectic. He appears ill.  Cardiovascular: Normal rate, regular rhythm and normal heart sounds.   Pulmonary/Chest: He has decreased breath sounds in the right lower field and the left lower field.  Neurological: He appears lethargic. He displays atrophy.  Skin: Skin is warm and dry.    Vital Signs: BP 133/67 (BP Location: Right Arm)   Pulse 84   Temp (!) 97.5 F (36.4 C) (Oral)   Resp 18   Ht 6\' 1"  (1.854 m)   Wt 94.8 kg (209 lb 1.6 oz)   SpO2 97%   BMI 27.59 kg/m  Pain Assessment: Faces   Pain Score: 0-No pain   SpO2: SpO2: 97 % O2 Device:SpO2: 97 % O2 Flow Rate: .O2 Flow Rate (L/min): 2 L/min  IO:  Intake/output summary:  Intake/Output Summary (Last 24 hours) at 06/01/17 1316 Last data filed at 06/01/17 1016  Gross per 24 hour  Intake              240 ml  Output             2885 ml  Net            -2645 ml    LBM: Last BM Date: 06/13/2017 Baseline Weight: Weight: 91.6 kg (202 lb) Most recent weight: Weight: 94.8 kg (209 lb 1.6 oz)     Palliative Assessment/Data:  30%   Flowsheet Rows     Most Recent Value  Intake Tab  Referral Department  Hospitalist  Unit at Time of Referral  Cardiac/Telemetry Unit  Palliative Care Primary Diagnosis  Cardiac  Date Notified  06/01/17  Palliative Care Type  New Palliative care  Reason for referral  Clarify Goals of Care  Date of Admission  06/18/2017  # of days IP prior to Palliative referral  2  Clinical Assessment  Psychosocial & Spiritual Assessment  Palliative Care Outcomes     Discussed with Dr Starla Link Time In: 1400 Time Out: 1515 Time Total: 75 min Greater than 50%  of this time was spent counseling and coordinating care related to the above assessment and plan.  Signed by: Wadie Lessen, NP   Please contact Palliative Medicine Team phone at [phone] for questions and concerns.  For individual provider: See Shea Evans

## 2017-06-01 NOTE — Progress Notes (Signed)
PT Cancellation Note  Patient Details Name: William [name] MRN: [id number] DOB: [birth year]   Cancelled Treatment:    Reason Eval/Treat Not Completed: Patient at procedure or test/unavailable Pt going off unit for CT scan. PT will check on pt later as time allows.    Salina April, PTA Pager: [phone]   06/01/2017, 11:21 AM

## 2017-06-01 NOTE — Progress Notes (Signed)
Pharmacy Antibiotic Note  William [name] is a 83 y.o. male admitted on 06/13/2017 with anasarca and flank pain. Pharmacy has been consulted for valacyclovir dosing for active shingles. Pt has CKD2 at baseline with eCrCl ~45 ml/min and is also being treated with ceftriaxone for possible UTI.  Plan: -Valcyclovir 1g PO BID x7 days -Monitor renal function closely  Height: 6\' 1"  (185.4 cm) Weight: 209 lb 1.6 oz (94.8 kg) IBW/kg (Calculated) : 79.9  Temp (24hrs), Avg:97.5 F (36.4 C), Min:97.5 F (36.4 C), Max:97.6 F (36.4 C)   Recent Labs Lab 05/26/17 0531 05/27/17 0428 06/11/2017 1614 06/19/2017 1630 05/31/17 0737 06/01/17 0240  WBC 8.6 8.8 8.2  --  5.8 6.1  CREATININE 1.31*  --  1.51*  --  1.36* 1.45*  LATICACIDVEN  --   --   --  1.97*  --   --     Estimated Creatinine Clearance: 45.2 mL/min (A) (by C-G formula based on SCr of 1.45 mg/dL (H)).    Allergies  Allergen Reactions  . Glyburide Other (See Comments)    Hypoglycemia     Thank you for allowing pharmacy to be a part of this patient's care.  Arrie Senate, PharmD PGY-2 Cardiology Pharmacy Resident Pager: [phone] 06/01/2017

## 2017-06-01 NOTE — Clinical Social Work Note (Signed)
Clinical Social Work Assessment  Patient Details  Name: William [name] MRN: [id number] Date of Birth: Jan 28, 1934  Date of referral:  06/01/17               Reason for consult:  Facility Placement                Permission sought to share information with:  Facility Art therapist granted to share information::  Yes, Verbal Permission Granted  Name::        Agency::  SNFs  Relationship::  wife and dtrs  Contact Information:  SNF  Housing/Transportation Living arrangements for the past 2 months:  Single Family Home Source of Information:  Patient, Adult Children Patient Interpreter Needed:  None Criminal Activity/Legal Involvement Pertinent to Current Situation/Hospitalization:  No - Comment as needed Significant Relationships:  Adult Children, Spouse Lives with:  Adult Children Do you feel safe going back to the place where you live?  Yes ("60%") Need for family participation in patient care:  Yes (Comment)  Care giving concerns:  Pt lives at home with wife.  Family concerned about wife's ability to care for patient physically and also about patient lack of 24 hour supervision since he has not been sleeping.   Social Worker assessment / plan:  CSW spoke with pt and pt dtr at bedside about concerns with return home.  Dtr very concerned about pt rapid decline and realistic about ability of pt mom to care for patient in current condition.  Pt has 2 dtrs who live locally but both work though pt dtr reports Lorriane Shire is considering quitting her job to help out.  Pt [facility] at [facility] for [facility] and went home with home services after- had decline at home and now much weaker than after rehab stay.  Employment status:  Retired Forensic scientist:  [employer] PT Recommendations:  [place] / Referral to community resources:  [place]  Patient/Family's Response to care:  Pt initially resistant to idea of going to another inpatient  rehab program but understands concerns expressed by dtr and willing to have CSW look into rehab options while they continue to discuss the plan.  Patient/Family's Understanding of and Emotional Response to Diagnosis, Current Treatment, and Prognosis:  No questions or concerns at this time- patient seems to be realistic about him limitations and is agreeable to palliative care consult to discuss [place].  Emotional Assessment Appearance:  Appears stated age Attitude/Demeanor/Rapport:    Affect (typically observed):  Accepting, Appropriate Orientation:  Oriented to  Time, Oriented to Place, Oriented to Self Alcohol / Substance use:  Not Applicable Psych involvement (Current and /or in the community):  No (Comment)  Discharge Needs  Concerns to be addressed:  Care Coordination Readmission within the last 30 days:  Yes Current discharge risk:  Physical Impairment Barriers to Discharge:  Continued Medical Work up   Jorge Ny, LCSW 06/01/2017, 11:32 AM

## 2017-06-01 NOTE — Progress Notes (Signed)
Physical Therapy Treatment Patient Details Name: William [name] MRN: [id number] DOB: [birth year] Today's Date: 06/01/2017    History of Present Illness 83 year old male with history of paroxysmal atrial fibrillation on anticoagulation, diabetes mellitus, grade 2 diastolic dysfunction who was admitted last month for obstructive uropathy requiring initial stents which failed and hence patient required bilateral nephrostomy tubes and was discharged to rehabilitation unit and recently discharged.  Readmitted 05/28/2017 for worsening shortness of breath and lower extremity edema; CT abdomen showing fluid overload with pleural effusion    PT Comments    Patient continues to demonstrate decreased activity tolerance and strength and required assistance for all mobility. Pt able to ambulate 33ft with min A this session. Recommending [facility] for further skilled PT services to maximize independence and safety with mobility prior to d/c home with wife's assistance.   Follow Up Recommendations  SNF     Equipment Recommendations  None recommended by PT    Recommendations for Other Services OT consult     Precautions / Restrictions Precautions Precautions: Fall Precaution Comments: bil nephrostomy tubes    Mobility  Bed Mobility Overal bed mobility: Needs Assistance Bed Mobility: Supine to Sit;Sit to Supine     Supine to sit: HOB elevated;Min assist Sit to supine: Mod assist   General bed mobility comments: cues for sequencing; assist to elevate trunk into sitting; use of rail and HOB elevated; assist to bring bilat LE into bed   Transfers Overall transfer level: Needs assistance Equipment used: Rolling walker (2 wheeled) Transfers: Sit to/from Stand Sit to Stand: Min assist;From elevated surface         General transfer comment: assist to stabilize RW, power up into standing, and steady upon standing; pt uses momentum and pulls on RW despite cues for safe hand  placement  Ambulation/Gait Ambulation/Gait assistance: Min assist Ambulation Distance (Feet): 30 Feet Assistive device: Rolling walker (2 wheeled) Gait Pattern/deviations: Step-through pattern;Trunk flexed;Wide base of support;Decreased dorsiflexion - right;Decreased dorsiflexion - left Gait velocity: decreased   General Gait Details: cues for posture and safe use of AD; pt with increased trunk and bilat LE flexion with increased distance; cues for breathing as pt tends to hold breath when ambulating   Stairs            Wheelchair Mobility    Modified Rankin (Stroke Patients Only)       Balance Overall balance assessment: Needs assistance Sitting-balance support: No upper extremity supported;Feet supported Sitting balance-Leahy Scale: Fair     Standing balance support: Bilateral upper extremity supported Standing balance-Leahy Scale: Poor Standing balance comment: reliant on RW for stability in standing                            Cognition Arousal/Alertness: Awake/alert Behavior During Therapy: WFL for tasks assessed/performed Overall Cognitive Status: Within Functional Limits for tasks assessed                                        Exercises      General Comments General comments (skin integrity, edema, etc.): educated pt and family on LE therex       Pertinent Vitals/Pain Pain Assessment: No/denies pain    Home Living                      Prior Function  PT Goals (current goals can now be found in the care plan section) Progress towards PT goals: Progressing toward goals    Frequency    Min 3X/week      PT Plan Discharge plan needs to be updated    Co-evaluation              AM-PAC PT "6 Clicks" Daily Activity  Outcome Measure  Difficulty turning over in bed (including adjusting bedclothes, sheets and blankets)?: Total Difficulty moving from lying on back to sitting on the side of the  bed? : Total Difficulty sitting down on and standing up from a chair with arms (e.g., wheelchair, bedside commode, etc,.)?: Total Help needed moving to and from a bed to chair (including a wheelchair)?: A Little Help needed walking in hospital room?: A Little Help needed climbing 3-5 steps with a railing? : A Lot 6 Click Score: 11    End of Session Equipment Utilized During Treatment: Gait belt Activity Tolerance: Patient tolerated treatment well Patient left: in bed;with call bell/phone within reach;with family/visitor present Nurse Communication: Mobility status PT Visit Diagnosis: Unsteadiness on feet (R26.81);Other abnormalities of gait and mobility (R26.89);Muscle weakness (generalized) (M62.81)     Time: 7096-4383 PT Time Calculation (min) (ACUTE ONLY): 35 min  Charges:  $Gait Training: 8-22 mins $Therapeutic Activity: 8-22 mins                    G Codes:       Earney Navy, PTA Pager: [phone]     Darliss Cheney 06/01/2017, 4:16 PM

## 2017-06-01 NOTE — Progress Notes (Signed)
VASCULAR LAB PRELIMINARY  PRELIMINARY  PRELIMINARY  PRELIMINARY  Bilateral lower extremity venous duplex completed.    Preliminary report:  Bilateral:  No evidence of DVT, superficial thrombosis, or Baker's Cyst.   Mckenley Birenbaum, RVS 06/01/2017, 5:16 PM

## 2017-06-01 NOTE — Progress Notes (Signed)
Patient ID: William [name], male   DOB: 16-Sep-1934, 83 y.o.   MRN: [id number]  PROGRESS NOTE    William [name]  LAG:[id number] DOB: 1934/10/08 DOA: 06/24/2017 PCP: Jani Gravel, MD   Brief Narrative:  83 year old male with history of paroxysmal atrial fibrillation on anticoagulation, diabetes mellitus, grade 2 diastolic dysfunction who was admitted last month for obstructive uropathy requiring initial stents which failed and hence patient required bilateral nephrostomy tubes and was discharged to rehabilitation unit and subsequently discharged a few days ago from the rehabilitation unit and was readmitted yesterday for worsening shortness of breath and lower extremity edema; CT abdomen showing fluid overload with pleural effusion. He was started on intravenous Lasix.  Assessment & Plan:   Principal Problem:   Acute pulmonary edema (HCC) Active Problems:   History of prostate cancer   Atrial fibrillation (HCC)   Stage 3 chronic kidney disease   Abdominal pain   Type 2 diabetes mellitus with renal complication (HCC)   Fluid overload    1. Fluid overload/pulmonary edema - patient appears in anasarca. Rule out cardiac causes; follow-up 2-D echo.  Consultation and follow-up from nephrology has been appreciated. Intravenous Lasix has been changed to oral Lasix. Strict input and output, daily weights. Monitor creatinine. Last 2-D echo done in December 2017 showing EF of 60-65% with grade 2 diastolic dysfunction.  Lower extremity DUPLEX TO RULE OUT DVT. 2. Bilateral flank pain with recent nephrostomy tubes placement - CT scan did not show anything acute.  Spoke to on call urologist Dr. Pilar Jarvis on 05/31/2017 and reviewed the case with him. He thinks that since the CAT scan does not show any obstruction of the nephrostomy tubes, inpatient urology evaluation is not needed.  Patient can follow up with Dr. Junious Silk as an outpatient. IR consult for follow-up of nephrostomy tubes. 3. Diabetes mellitus type 2 -  continue Levemir and sliding scale insulin coverage 4. Paroxysmal atrial fibrillation - chads 2 vascular score more than 2. Patient is on Apixaban. 5. Normocytic normochromic anemia - follow CBC. Has had colonoscopy in 2014 which showed radiation proctitis and also diverticulosis. 6. Cholelithiasis - no signs of acute cholecystitis. Follow clinically. 7. Possible UTI - follow urine cultures. Continue ceftriaxone. 8. Moderate to severe malnutrition with hypo-albuminemia- nutrition consult 9. Probable acute shingles rash on the lower abdomen and groin: Start Valtrex 1000 mg twice a day renally dosed for 7 days 10. Insomnia and very poor appetite: CAT scan of the head to rule out any organic cerebral cause; increase trazodone 200 mg daily, and Ambien. Patient might need neurology evaluation if mental status does not improve. Palliative care consultation as well as to discuss goals of care.  DVT prophylaxis:Apixaban. Code Status:Full code. Family Communication:Patient's daughter. Disposition Plan:To be determined.  Consultants: Nephrology; spoke to urologist on call Dr. Pilar Jarvis on phone on 05/31/2017  Procedures:  echo and lower extremity duplex are pending  Antimicrobials: Rocephin from 05/31/2017     Subjective: Patient seen and examined at bedside. He is slightly awake but very drowsy and doesn't answer much questions. No documented fever, vomiting.  Objective: Vitals:   05/31/17 1330 05/31/17 1900 05/31/17 2025 06/01/17 0533  BP: 119/61  (!) 110/56 133/67  Pulse: 69  79 84  Resp: 18  18 18   Temp: (!) 97.5 F (36.4 C)  97.6 F (36.4 C) (!) 97.5 F (36.4 C)  TempSrc: Oral  Oral Oral  SpO2: 100% 100% 98% 97%  Weight:    94.8 kg (209  lb 1.6 oz)  Height:        Intake/Output Summary (Last 24 hours) at 06/01/17 1308 Last data filed at 06/01/17 1016  Gross per 24 hour  Intake              240 ml  Output             2885 ml  Net            -2645 ml   Filed Weights     06/24/2017 1604 05/31/17 0242 06/01/17 0533  Weight: 91.6 kg (202 lb) 95.1 kg (209 lb 9.6 oz) 94.8 kg (209 lb 1.6 oz)    Examination:  General exam: Appears calm and comfortable  Respiratory system: Bilateral decreased breath sound at bases With basilar crackles Cardiovascular system: S1 & S2 heard, rate controlled  Gastrointestinal system: Abdomen is nondistended, soft and nontender. Normal bowel sounds heard. Central nervous system: Drowsy. No focal neurological deficits. Moving extremities Extremities: No cyanosis, clubbing; bilateral 1-2+ lower symmetry pitting pedal edema  Skin: Scattered vesicular/pustular rash over the left lower abdomen and some on the groin more compared to yesterday. Lymph: No cervical lymph adenopathy    Data Reviewed: I have personally reviewed following labs and imaging studies  CBC:  Recent Labs Lab 05/26/17 0531 05/27/17 0428 06/13/2017 1614 05/31/17 0737 06/01/17 0240  WBC 8.6 8.8 8.2 5.8 6.1  NEUTROABS 5.9  --  5.7 3.8 3.9  HGB 10.6* 10.8* 12.2* 10.9* 11.3*  HCT 33.2* 33.5* 37.6* 34.2* 35.2*  MCV 93.5 95.2 92.8 94.0 94.4  PLT 290 279 249 222 706   Basic Metabolic Panel:  Recent Labs Lab 05/26/17 0531 06/11/2017 1614 05/31/17 0737 06/01/17 0240  NA 138 134* 135 134*  K 3.8 4.8 4.6 3.9  CL 105 99* 103 99*  CO2 28 26 27 26   GLUCOSE 84 155* 99 201*  BUN 13 22* 19 21*  CREATININE 1.31* 1.51* 1.36* 1.45*  CALCIUM 8.7* 9.5 8.6* 8.9  MG  --   --   --  1.8   GFR: Estimated Creatinine Clearance: 45.2 mL/min (A) (by C-G formula based on SCr of 1.45 mg/dL (H)). Liver Function Tests:  Recent Labs Lab 06/06/2017 1614 05/31/17 0737 06/01/17 0240  AST 36 27 31  ALT 27 22 24   ALKPHOS 98 81 88  BILITOT 0.9 0.9 0.8  PROT 5.8* 5.0* 5.2*  ALBUMIN 2.8* 2.3* 2.4*   No results for input(s): LIPASE, AMYLASE in the last 168 hours. No results for input(s): AMMONIA in the last 168 hours. Coagulation Profile: No results for input(s): INR,  PROTIME in the last 168 hours. Cardiac Enzymes:  Recent Labs Lab 05/31/17 0737 05/31/17 1140 05/31/17 1826  TROPONINI 0.08* 0.07* 0.07*   BNP (last 3 results) No results for input(s): PROBNP in the last 8760 hours. HbA1C: No results for input(s): HGBA1C in the last 72 hours. CBG:  Recent Labs Lab 05/31/17 1115 05/31/17 1640 05/31/17 2024 06/01/17 0623 06/01/17 1109  GLUCAP 87 144* 206* 145* 125*   Lipid Profile: No results for input(s): CHOL, HDL, LDLCALC, TRIG, CHOLHDL, LDLDIRECT in the last 72 hours. Thyroid Function Tests:  Recent Labs  05/31/17 1457 06/01/17 1050  TSH 7.023*  --   FREET4  --  1.08   Anemia Panel: No results for input(s): VITAMINB12, FOLATE, FERRITIN, TIBC, IRON, RETICCTPCT in the last 72 hours. Sepsis Labs:  Recent Labs Lab 06/22/2017 1630  LATICACIDVEN 1.97*    Recent Results (from the past 240 hour(s))  Urine  culture     Status: None (Preliminary result)   Collection Time: 06/12/2017  8:25 PM  Result Value Ref Range Status   Specimen Description URINE, RANDOM  Final   Special Requests left nephrostomy tube ADD [id number]  Final   Culture CULTURE REINCUBATED FOR BETTER GROWTH  Final   Report Status PENDING  Incomplete         Radiology Studies: Ct Abdomen Pelvis Wo Contrast  Result Date: 05/31/2017 CLINICAL DATA:  Back pain.  Recent nephrostomy tubes. EXAM: CT ABDOMEN AND PELVIS WITHOUT CONTRAST TECHNIQUE: Multidetector CT imaging of the abdomen and pelvis was performed following the standard protocol without IV contrast. COMPARISON:  CT 04/14/2017 FINDINGS: Lower chest: Moderate bilateral pleural effusions and adjacent compressive atelectasis. Mild cardiomegaly with coronary artery calcifications. Minimal pericardial fluid. Hepatobiliary: No evidence of focal lesion allowing for lack contrast. Calcified gallstone within the gallbladder. No biliary dilatation. Small volume of perihepatic ascites. Pancreas: Generalized mesenteric edema,  including about the pancreatic head. Spleen: Normal in size without focal abnormality. Adrenals/Urinary Tract: No adrenal nodule. Bilateral nephrostomy tubes, uncal in the renal pelvis. Bilateral ureteral stent placement with proximal pigtails in the renal pelvis, distal pigtails in the bladder. There is no hydronephrosis. Urinary bladder is completely decompressed. Minimal air in the renal collecting systems likely secondary to nephrostomy placement. No perinephric fluid collection. Nonobstructing stone in the lower left kidney is again seen. Stomach/Bowel: Stomach is physiologically distended. No obstruction, enteric contrast has reached the colon. Few scattered colonic diverticular without acute diverticulitis. Normal appendix. High-density barium within the sigmoid colon and sigmoid colonic diverticula from prior barium swallow. No definite colonic wall thickening. Vascular/Lymphatic: Aortic and branch atherosclerosis. Limited assessment for adenopathy given lack contrast. No evidence of abdominopelvic adenopathy. Reproductive: Brachytherapy seeds in the prostate gland. Other: Small volume ala of abdominal ascites with fluid tracking in the pericolic gutters. Minimal mesenteric ascites with diffuse mesenteric edema. Ascites tracks within the left inguinal canal. No free air. No intra-abdominal abscess. Mild body wall edema. Musculoskeletal: There are no acute or suspicious osseous abnormalities. IMPRESSION: 1. Bilateral nephrostomy tubes and ureteral stents. No hydronephrosis. Urinary bladder is completely decompressed. 2. Findings consistent with fluid overload with moderate pleural effusions, small volume intra-abdominal ascites and mesenteric edema. 3. Colonic diverticulosis without acute inflammation. 4. Aortic atherosclerosis. 5. Cholelithiasis. Electronically Signed   By: Jeb Levering M.D.   On: 05/31/2017 00:36   Dg Chest 2 View  Result Date: 06/21/2017 CLINICAL DATA:  Shortness of breath for  several days EXAM: CHEST  2 VIEW COMPARISON:  05/15/2017 FINDINGS: Cardiac shadow is stable. The lungs are well aerated bilaterally. Chronic blunting of the costophrenic angles is again seen. No focal infiltrate is noted. No acute bony abnormality is noted. IMPRESSION: Chronic changes without acute abnormality. Electronically Signed   By: Inez Catalina M.D.   On: 05/29/2017 16:39   Ct Head Wo Contrast  Result Date: 06/01/2017 CLINICAL DATA:  83 year old male with history of altered mental status. EXAM: CT HEAD WITHOUT CONTRAST TECHNIQUE: Contiguous axial images were obtained from the base of the skull through the vertex without intravenous contrast. COMPARISON:  CT the head 10/26/2016. FINDINGS: Brain: Mild cerebral atrophy. Patchy and confluent areas of decreased attenuation are noted throughout the deep and periventricular white matter of the cerebral hemispheres bilaterally, compatible with chronic microvascular ischemic disease. More focal wall low-attenuation in the cortical and subcortical region of the left frontal lobe, similar to prior study 10/26/2016, presumably related to remote infarct. No evidence of acute infarction,  hemorrhage, hydrocephalus, extra-axial collection or mass lesion/mass effect. Vascular: No hyperdense vessel or unexpected calcification. Skull: Normal. Negative for fracture or focal lesion. Sinuses/Orbits: No acute finding. Other: None. IMPRESSION: 1. No acute intracranial abnormalities. 2. Mild cerebral atrophy with chronic ischemic changes, similar to prior study from 10/26/2016, as above. Electronically Signed   By: Vinnie Langton M.D.   On: 06/01/2017 12:20        Scheduled Meds: . amLODipine  5 mg Oral QHS  . apixaban  5 mg Oral BID  . feeding supplement (ENSURE ENLIVE)  237 mL Oral TID BM  . furosemide  40 mg Oral BID  . Gerhardt's butt cream   Topical BID  . insulin aspart  0-9 Units Subcutaneous TID WC  . insulin detemir  6 Units Subcutaneous QHS  .  multivitamin  1 tablet Oral QHS  . pravastatin  40 mg Oral Q supper  . traZODone  100 mg Oral QHS  . valACYclovir  1,000 mg Oral BID  . zolpidem  5 mg Oral QHS   Continuous Infusions: . cefTRIAXone (ROCEPHIN)  IV Stopped (06/01/17 0915)     LOS: 1 day        Aline August, MD [facility] Pager [phone]  If 7PM-7AM, please contact night-coverage [email or web address] Password [email or web address] 06/01/2017, 1:08 PM

## 2017-06-01 NOTE — Progress Notes (Signed)
Patient ID: SILVER PARKEY, male   DOB: 07-07-1934, 83 y.o.   MRN: [id number] S:Feels better O:BP 133/67 (BP Location: Right Arm)   Pulse 84   Temp (!) 97.5 F (36.4 C) (Oral)   Resp 18   Ht 6\' 1"  (1.854 m)   Wt 94.8 kg (209 lb 1.6 oz)   SpO2 97%   BMI 27.59 kg/m   Intake/Output Summary (Last 24 hours) at 06/01/17 0912 Last data filed at 06/01/17 8546  Gross per 24 hour  Intake              410 ml  Output             2840 ml  Net            -2430 ml   Intake/Output: I/O last 3 completed shifts: In: 2650 [P.O.:600; IV Piggyback:2050] Out: 2830 [Drains:2830]  Intake/Output this shift:  Total I/O In: -  Out: 375 [Drains:375] Weight change: 3.221 kg (7 lb 1.6 oz) Gen:WD WN WM in NAD CVS: no rub Resp:CTA Abd: benign Ext: 1+ pitting lower ext edema   Recent Labs Lab 05/26/17 0531 06/07/2017 1614 05/31/17 0737 06/01/17 0240  NA 138 134* 135 134*  K 3.8 4.8 4.6 3.9  CL 105 99* 103 99*  CO2 28 26 27 26   GLUCOSE 84 155* 99 201*  BUN 13 22* 19 21*  CREATININE 1.31* 1.51* 1.36* 1.45*  ALBUMIN  --  2.8* 2.3* 2.4*  CALCIUM 8.7* 9.5 8.6* 8.9  AST  --  36 27 31  ALT  --  27 22 24    Liver Function Tests:  Recent Labs Lab 06/08/2017 1614 05/31/17 0737 06/01/17 0240  AST 36 27 31  ALT 27 22 24   ALKPHOS 98 81 88  BILITOT 0.9 0.9 0.8  PROT 5.8* 5.0* 5.2*  ALBUMIN 2.8* 2.3* 2.4*   No results for input(s): LIPASE, AMYLASE in the last 168 hours. No results for input(s): AMMONIA in the last 168 hours. CBC:  Recent Labs Lab 05/26/17 0531 05/27/17 0428 06/11/2017 1614 05/31/17 0737 06/01/17 0240  WBC 8.6 8.8 8.2 5.8 6.1  NEUTROABS 5.9  --  5.7 3.8 3.9  HGB 10.6* 10.8* 12.2* 10.9* 11.3*  HCT 33.2* 33.5* 37.6* 34.2* 35.2*  MCV 93.5 95.2 92.8 94.0 94.4  PLT 290 279 249 222 226   Cardiac Enzymes:  Recent Labs Lab 05/31/17 0737 05/31/17 1140 05/31/17 1826  TROPONINI 0.08* 0.07* 0.07*   CBG:  Recent Labs Lab 05/31/17 0629 05/31/17 1115 05/31/17 1640  05/31/17 2024 06/01/17 0623  GLUCAP 76 87 144* 206* 145*    Iron Studies: No results for input(s): IRON, TIBC, TRANSFERRIN, FERRITIN in the last 72 hours. Studies/Results: Ct Abdomen Pelvis Wo Contrast  Result Date: 05/31/2017 CLINICAL DATA:  Back pain.  Recent nephrostomy tubes. EXAM: CT ABDOMEN AND PELVIS WITHOUT CONTRAST TECHNIQUE: Multidetector CT imaging of the abdomen and pelvis was performed following the standard protocol without IV contrast. COMPARISON:  CT 04/14/2017 FINDINGS: Lower chest: Moderate bilateral pleural effusions and adjacent compressive atelectasis. Mild cardiomegaly with coronary artery calcifications. Minimal pericardial fluid. Hepatobiliary: No evidence of focal lesion allowing for lack contrast. Calcified gallstone within the gallbladder. No biliary dilatation. Small volume of perihepatic ascites. Pancreas: Generalized mesenteric edema, including about the pancreatic head. Spleen: Normal in size without focal abnormality. Adrenals/Urinary Tract: No adrenal nodule. Bilateral nephrostomy tubes, uncal in the renal pelvis. Bilateral ureteral stent placement with proximal pigtails in the renal pelvis, distal pigtails in the bladder. There  is no hydronephrosis. Urinary bladder is completely decompressed. Minimal air in the renal collecting systems likely secondary to nephrostomy placement. No perinephric fluid collection. Nonobstructing stone in the lower left kidney is again seen. Stomach/Bowel: Stomach is physiologically distended. No obstruction, enteric contrast has reached the colon. Few scattered colonic diverticular without acute diverticulitis. Normal appendix. High-density barium within the sigmoid colon and sigmoid colonic diverticula from prior barium swallow. No definite colonic wall thickening. Vascular/Lymphatic: Aortic and branch atherosclerosis. Limited assessment for adenopathy given lack contrast. No evidence of abdominopelvic adenopathy. Reproductive: Brachytherapy  seeds in the prostate gland. Other: Small volume ala of abdominal ascites with fluid tracking in the pericolic gutters. Minimal mesenteric ascites with diffuse mesenteric edema. Ascites tracks within the left inguinal canal. No free air. No intra-abdominal abscess. Mild body wall edema. Musculoskeletal: There are no acute or suspicious osseous abnormalities. IMPRESSION: 1. Bilateral nephrostomy tubes and ureteral stents. No hydronephrosis. Urinary bladder is completely decompressed. 2. Findings consistent with fluid overload with moderate pleural effusions, small volume intra-abdominal ascites and mesenteric edema. 3. Colonic diverticulosis without acute inflammation. 4. Aortic atherosclerosis. 5. Cholelithiasis. Electronically Signed   By: Jeb Levering M.D.   On: 05/31/2017 00:36   Dg Chest 2 View  Result Date: 06/14/2017 CLINICAL DATA:  Shortness of breath for several days EXAM: CHEST  2 VIEW COMPARISON:  05/15/2017 FINDINGS: Cardiac shadow is stable. The lungs are well aerated bilaterally. Chronic blunting of the costophrenic angles is again seen. No focal infiltrate is noted. No acute bony abnormality is noted. IMPRESSION: Chronic changes without acute abnormality. Electronically Signed   By: Inez Catalina M.D.   On: 06/03/2017 16:39   . amLODipine  5 mg Oral QHS  . apixaban  5 mg Oral BID  . feeding supplement (ENSURE ENLIVE)  237 mL Oral TID BM  . furosemide  60 mg Intravenous Q12H  . insulin aspart  0-9 Units Subcutaneous TID WC  . insulin detemir  6 Units Subcutaneous QHS  . multivitamin  1 tablet Oral QHS  . pravastatin  40 mg Oral Q supper  . traZODone  50 mg Oral QHS    BMET    Component Value Date/Time   NA 134 (L) 06/01/2017 0240   K 3.9 06/01/2017 0240   CL 99 (L) 06/01/2017 0240   CO2 26 06/01/2017 0240   GLUCOSE 201 (H) 06/01/2017 0240   BUN 21 (H) 06/01/2017 0240   CREATININE 1.45 (H) 06/01/2017 0240   CALCIUM 8.9 06/01/2017 0240   GFRNONAA 44 (L) 06/01/2017 0240    GFRAA 51 (L) 06/01/2017 0240   CBC    Component Value Date/Time   WBC 6.1 06/01/2017 0240   RBC 3.73 (L) 06/01/2017 0240   HGB 11.3 (L) 06/01/2017 0240   HCT 35.2 (L) 06/01/2017 0240   HCT 39.5 05/11/2017 0959   PLT 226 06/01/2017 0240   MCV 94.4 06/01/2017 0240   MCH 30.3 06/01/2017 0240   MCHC 32.1 06/01/2017 0240   RDW 15.5 06/01/2017 0240   LYMPHSABS 0.7 06/01/2017 0240   MONOABS 1.3 (H) 06/01/2017 0240   EOSABS 0.2 06/01/2017 0240   BASOSABS 0.0 06/01/2017 0240    Assessment/Plan:  1. Volume overload- does not appear to be related to Nephrotic syndrome or recurrent renal failure.  Agree with cardiac workup as well as liver disease.  He would also benefit from protein supplementation due to his hypoalbuminemia which may be contributing to his anasarca.  Will also check TSH and await ECHO results (does have history  of diastolic dysfunction) 1. Agree with diuretics and follow I's/O's and daily weights 2. Net negative over 2 liters. Would suggest changing IV lasix to 40mg  po bid and follow given his brisk response to 60mg  iv bid 3. Will check UPEP for completeness, however low urine prot/creat ratio so not nephrotic syndrome. 4. Await ECHO results 2. AKI/CKD- due to bilateral obstructive uropathy s/p bilateral PNT placement.  Functioning well. 3. Elevated TSH- will check free T3 and T4 as this may be contributing. 4. Protein malnutrition- supplement 5. DM type 2 - per primary svc 6. HTN- stable 7. A fib 8. Obstructive uropathy- PNT's patent, f/u with Urology and IR 9. Anemia of chronic disease 10. Cholelithiasis  11. FTT 12. Deconditioning- just discharged from CIR 4 days ago.  Nsg to assess PT needs  Donetta Potts, MD [facility] [phone]

## 2017-06-01 NOTE — Plan of Care (Signed)
Problem: Fluid Volume: Goal: Ability to maintain a balanced intake and output will improve Outcome: Progressing Lower extremity edema decreasing.

## 2017-06-01 NOTE — Progress Notes (Signed)
Patient ID: William [name], male   DOB: May 06, 1934, 83 y.o.   MRN: [id number]    Referring Physician(s): Dr. Aline August  Supervising Physician: Sandi Mariscal  Patient Status: [facility] - In-pt  Chief Complaint: B PCNs  Subjective: Patient with obstructive uropathy with failed stent treatment requiring B PCNs to be placed on 05-09-17.  He was in rehab and just discharged 3 days ago.  He has been readmitted secondary to SOB and lower extremity edema.  Palliative care medicine is currently seeing him.  IR has been asked to evaluate his PCNs just to make sure they are working well.  The wife states they are being flushed and they are draining well.  He has back pain, but not from his PCNs.  There is no drainage around either tube.   Allergies: Glyburide  Medications: Prior to Admission medications   Medication Sig Start Date End Date Taking? Authorizing Provider  acetaminophen (TYLENOL) 325 MG tablet Take 325 mg by mouth every 6 (six) hours as needed for mild pain.    Yes [provider]  amLODipine (NORVASC) 5 MG tablet Take 1 tablet (5 mg total) by mouth at bedtime. 05/25/17  Yes Angiulli, Lavon Paganini, PA-C  apixaban (ELIQUIS) 5 MG TABS tablet Take 1 tablet (5 mg total) by mouth 2 (two) times daily. 05/25/17  Yes Angiulli, Lavon Paganini, PA-C  LEVEMIR FLEXTOUCH 100 UNIT/ML Pen Inject 6 Units into the skin at bedtime. 05/26/17  Yes Love, Ivan Anchors, PA-C  LORazepam (ATIVAN) 1 MG tablet Take 0.5-1 tablets (0.5-1 mg total) by mouth at bedtime as needed for anxiety or sleep. 05/26/17  Yes Love, Ivan Anchors, PA-C  methocarbamol (ROBAXIN) 500 MG tablet Take 1 tablet (500 mg total) by mouth 2 (two) times daily as needed for muscle spasms. 05/25/17  Yes Angiulli, Lavon Paganini, PA-C  multivitamin (RENA-VIT) TABS tablet Take 1 tablet by mouth at bedtime. 05/25/17  Yes Angiulli, Lavon Paganini, PA-C  nystatin cream (MYCOSTATIN) Apply topically 3 (three) times daily. 05/26/17  Yes Love, Ivan Anchors, PA-C  pravastatin (PRAVACHOL) 40  MG tablet Take 1 tablet (40 mg total) by mouth daily with supper. 05/25/17  Yes Angiulli, Lavon Paganini, PA-C  traZODone (DESYREL) 50 MG tablet Take 50 mg by mouth at bedtime. 05/13/17  Yes [provider]  loperamide (IMODIUM A-D) 2 MG tablet Take 2 mg by mouth 4 (four) times daily as needed for diarrhea or loose stools.    [provider]    Vital Signs: BP (!) 125/51 (BP Location: Right Arm)   Pulse 78   Temp (!) 97.4 F (36.3 C) (Oral)   Resp 18   Ht 6\' 1"  (1.854 m)   Wt 209 lb 1.6 oz (94.8 kg)   SpO2 96%   BMI 27.59 kg/m   Physical Exam: Skin: both PCNs are draining well with clear yellow urine output.  Drain sites are c/d/i.  No pain at either site.  Imaging: Ct Abdomen Pelvis Wo Contrast  Result Date: 05/31/2017 CLINICAL DATA:  Back pain.  Recent nephrostomy tubes. EXAM: CT ABDOMEN AND PELVIS WITHOUT CONTRAST TECHNIQUE: Multidetector CT imaging of the abdomen and pelvis was performed following the standard protocol without IV contrast. COMPARISON:  CT 04/14/2017 FINDINGS: Lower chest: Moderate bilateral pleural effusions and adjacent compressive atelectasis. Mild cardiomegaly with coronary artery calcifications. Minimal pericardial fluid. Hepatobiliary: No evidence of focal lesion allowing for lack contrast. Calcified gallstone within the gallbladder. No biliary dilatation. Small volume of perihepatic ascites. Pancreas: Generalized mesenteric edema, including  about the pancreatic head. Spleen: Normal in size without focal abnormality. Adrenals/Urinary Tract: No adrenal nodule. Bilateral nephrostomy tubes, uncal in the renal pelvis. Bilateral ureteral stent placement with proximal pigtails in the renal pelvis, distal pigtails in the bladder. There is no hydronephrosis. Urinary bladder is completely decompressed. Minimal air in the renal collecting systems likely secondary to nephrostomy placement. No perinephric fluid collection. Nonobstructing stone in the lower left kidney is  again seen. Stomach/Bowel: Stomach is physiologically distended. No obstruction, enteric contrast has reached the colon. Few scattered colonic diverticular without acute diverticulitis. Normal appendix. High-density barium within the sigmoid colon and sigmoid colonic diverticula from prior barium swallow. No definite colonic wall thickening. Vascular/Lymphatic: Aortic and branch atherosclerosis. Limited assessment for adenopathy given lack contrast. No evidence of abdominopelvic adenopathy. Reproductive: Brachytherapy seeds in the prostate gland. Other: Small volume ala of abdominal ascites with fluid tracking in the pericolic gutters. Minimal mesenteric ascites with diffuse mesenteric edema. Ascites tracks within the left inguinal canal. No free air. No intra-abdominal abscess. Mild body wall edema. Musculoskeletal: There are no acute or suspicious osseous abnormalities. IMPRESSION: 1. Bilateral nephrostomy tubes and ureteral stents. No hydronephrosis. Urinary bladder is completely decompressed. 2. Findings consistent with fluid overload with moderate pleural effusions, small volume intra-abdominal ascites and mesenteric edema. 3. Colonic diverticulosis without acute inflammation. 4. Aortic atherosclerosis. 5. Cholelithiasis. Electronically Signed   By: Jeb Levering M.D.   On: 05/31/2017 00:36   Dg Chest 2 View  Result Date: 06/07/2017 CLINICAL DATA:  Shortness of breath for several days EXAM: CHEST  2 VIEW COMPARISON:  05/15/2017 FINDINGS: Cardiac shadow is stable. The lungs are well aerated bilaterally. Chronic blunting of the costophrenic angles is again seen. No focal infiltrate is noted. No acute bony abnormality is noted. IMPRESSION: Chronic changes without acute abnormality. Electronically Signed   By: Inez Catalina M.D.   On: 06/18/2017 16:39   Ct Head Wo Contrast  Result Date: 06/01/2017 CLINICAL DATA:  83 year old male with history of altered mental status. EXAM: CT HEAD WITHOUT CONTRAST  TECHNIQUE: Contiguous axial images were obtained from the base of the skull through the vertex without intravenous contrast. COMPARISON:  CT the head 10/26/2016. FINDINGS: Brain: Mild cerebral atrophy. Patchy and confluent areas of decreased attenuation are noted throughout the deep and periventricular white matter of the cerebral hemispheres bilaterally, compatible with chronic microvascular ischemic disease. More focal wall low-attenuation in the cortical and subcortical region of the left frontal lobe, similar to prior study 10/26/2016, presumably related to remote infarct. No evidence of acute infarction, hemorrhage, hydrocephalus, extra-axial collection or mass lesion/mass effect. Vascular: No hyperdense vessel or unexpected calcification. Skull: Normal. Negative for fracture or focal lesion. Sinuses/Orbits: No acute finding. Other: None. IMPRESSION: 1. No acute intracranial abnormalities. 2. Mild cerebral atrophy with chronic ischemic changes, similar to prior study from 10/26/2016, as above. Electronically Signed   By: Vinnie Langton M.D.   On: 06/01/2017 12:20    Labs:  CBC:  Recent Labs  05/27/17 0428 06/22/2017 1614 05/31/17 0737 06/01/17 0240  WBC 8.8 8.2 5.8 6.1  HGB 10.8* 12.2* 10.9* 11.3*  HCT 33.5* 37.6* 34.2* 35.2*  PLT 279 249 222 226    COAGS:  Recent Labs  10/26/16 1033  05/08/17 1252 05/09/17 0608 05/09/17 1038 05/09/17 2235  INR 1.02  --   --   --  1.22  --   APTT 29  < > 77* 67* 34 72*  < > = values in this interval not displayed.  BMP:  Recent Labs  05/26/17 0531 06/14/2017 1614 05/31/17 0737 06/01/17 0240  NA 138 134* 135 134*  K 3.8 4.8 4.6 3.9  CL 105 99* 103 99*  CO2 28 26 27 26   GLUCOSE 84 155* 99 201*  BUN 13 22* 19 21*  CALCIUM 8.7* 9.5 8.6* 8.9  CREATININE 1.31* 1.51* 1.36* 1.45*  GFRNONAA 50* 42* 48* 44*  GFRAA 58* 49* 55* 51*    LIVER FUNCTION TESTS:  Recent Labs  05/15/17 0715 06/09/2017 1614 05/31/17 0737 06/01/17 0240    BILITOT 1.0 0.9 0.9 0.8  AST 62* 36 27 31  ALT 66* 27 22 24   ALKPHOS 99 98 81 88  PROT 5.6* 5.8* 5.0* 5.2*  ALBUMIN 2.5* 2.8* 2.3* 2.4*    Assessment and Plan: 1. Obstructive uropathy, s/p B PCNs on 05-09-17  CT scans show no evidence of hydronephrosis.  Both drains are in good position and working very well.  He is scheduled to follow up with Dr. Junious Silk on 05-22-17.  He will need to keep this appointment to determine what Dr. Lyndal Rainbow plan for his stents and drains would be.  Currently palliative care medicine is seeing him and discussing his currently situation.  No further IR intervention required at this time.  If indicated by Dr. Junious Silk after his next visit to keep his drains, he will then need to be set up for routine PCN exchanges.  Please call if you have any further questions.  Otherwise his drains are stable and can be flushed daily with 5-10cc of NS.  Electronically Signed: Henreitta Cea 06/01/2017, 2:05 PM   I spent a total of 15 Minutes at the the patient's bedside AND on the patient's hospital floor or unit, greater than 50% of which was counseling/coordinating care for obstructive uropathy, s/p PCN placements, drain evaluation

## 2017-06-01 NOTE — Consult Note (Signed)
[place] Nurse wound consult note Reason for Consult: sacrum, rash Wound type:MASD (moisture associated skin damage) buttock, intense redness but blanches. Noted rash is not concentrated in one area, over the back, left and right flank, left and right groin, neck right thigh.  Patient's daughter reports started at home just before bringing him back to the hospital. Rash seems to be worse on the left flank. Pustules. Does not present like shingles or candida Pressure Injury POA: NA Wound bed: intact skin over buttocks, red Pustules as described above, patient reports pruritic after [place] leaves the room. Rash is not painful. No new meds per patient's daughter No new detergents,ointments, powders No new linens, patient has not slept in bed other than his own bed at home recently. No new foods, no exposure to any new chemicals Drainage (amount, consistency, odor) none Periwound: intact  Dressing procedure/placement/frequency: Add antifungal cream for buttocks, rotate with barrier cream for protection from stool/urine. Will notify attending that rash may need work up per dermatology and since we do not have dermatology inpatient, may benefit from infectious disease. No topical care ordered without clear etiology.   Discussed POC with patient and bedside nurse.  Re consult if needed, will not follow at this time. Thanks  Rocquel Askren [employer], RN,CWOCN, CNS, [place] [phone])

## 2017-06-01 NOTE — Progress Notes (Signed)
   06/01/17 1605  Clinical Encounter Type  Visited With Patient and family together  Visit Type Other (Comment) ([place] consult)  Spiritual Encounters  Spiritual Needs Emotional  Stress Factors  Patient Stress Factors None identified  Family Stress Factors None identified  Introduction to Pt and family. Inquired about Catholic needs. Pt's wife reported the priest is aware and Pt received communion.

## 2017-06-02 ENCOUNTER — Inpatient Hospital Stay (HOSPITAL_COMMUNITY): Payer: Medicare Other

## 2017-06-02 DIAGNOSIS — R0600 Dyspnea, unspecified: Secondary | ICD-10-CM

## 2017-06-02 LAB — PHOSPHORUS: Phosphorus: 4.2 mg/dL (ref 2.5–4.6)

## 2017-06-02 LAB — ECHOCARDIOGRAM COMPLETE
Height: 73 in
WEIGHTICAEL: 3251.2 [oz_av]

## 2017-06-02 LAB — CBC
HEMATOCRIT: 35.8 % — AB (ref 39.0–52.0)
HEMOGLOBIN: 11.6 g/dL — AB (ref 13.0–17.0)
MCH: 30.1 pg (ref 26.0–34.0)
MCHC: 32.4 g/dL (ref 30.0–36.0)
MCV: 93 fL (ref 78.0–100.0)
Platelets: 199 10*3/uL (ref 150–400)
RBC: 3.85 MIL/uL — ABNORMAL LOW (ref 4.22–5.81)
RDW: 15.5 % (ref 11.5–15.5)
WBC: 7.5 10*3/uL (ref 4.0–10.5)

## 2017-06-02 LAB — COMPREHENSIVE METABOLIC PANEL
ALBUMIN: 2.5 g/dL — AB (ref 3.5–5.0)
ALK PHOS: 90 U/L (ref 38–126)
ALT: 23 U/L (ref 17–63)
AST: 30 U/L (ref 15–41)
Anion gap: 10 (ref 5–15)
BILIRUBIN TOTAL: 0.6 mg/dL (ref 0.3–1.2)
BUN: 28 mg/dL — AB (ref 6–20)
CALCIUM: 8.8 mg/dL — AB (ref 8.9–10.3)
CO2: 28 mmol/L (ref 22–32)
Chloride: 96 mmol/L — ABNORMAL LOW (ref 101–111)
Creatinine, Ser: 1.56 mg/dL — ABNORMAL HIGH (ref 0.61–1.24)
GFR calc Af Amer: 46 mL/min — ABNORMAL LOW (ref >60)
GFR calc non Af Amer: 40 mL/min — ABNORMAL LOW (ref >60)
GLUCOSE: 202 mg/dL — AB (ref 65–99)
POTASSIUM: 3.9 mmol/L (ref 3.5–5.1)
Sodium: 134 mmol/L — ABNORMAL LOW (ref 135–145)
TOTAL PROTEIN: 5.4 g/dL — AB (ref 6.5–8.1)

## 2017-06-02 LAB — PROTEIN ELECTROPHORESIS, SERUM
A/G Ratio: 0.9 (ref 0.7–1.7)
ALPHA-1-GLOBULIN: 0.3 g/dL (ref 0.0–0.4)
Albumin ELP: 2.5 g/dL — ABNORMAL LOW (ref 2.9–4.4)
Alpha-2-Globulin: 0.9 g/dL (ref 0.4–1.0)
Beta Globulin: 0.9 g/dL (ref 0.7–1.3)
GLOBULIN, TOTAL: 2.8 g/dL (ref 2.2–3.9)
Gamma Globulin: 0.7 g/dL (ref 0.4–1.8)
TOTAL PROTEIN ELP: 5.3 g/dL — AB (ref 6.0–8.5)

## 2017-06-02 LAB — URINE CULTURE

## 2017-06-02 LAB — GLUCOSE, CAPILLARY
Glucose-Capillary: 224 mg/dL — ABNORMAL HIGH (ref 65–99)
Glucose-Capillary: 189 mg/dL — ABNORMAL HIGH (ref 65–99)
Glucose-Capillary: 217 mg/dL — ABNORMAL HIGH (ref 65–99)
Glucose-Capillary: 239 mg/dL — ABNORMAL HIGH (ref 65–99)

## 2017-06-02 LAB — T3, FREE: T3 FREE: 2.3 pg/mL (ref 2.0–4.4)

## 2017-06-02 LAB — MAGNESIUM: Magnesium: 1.7 mg/dL (ref 1.7–2.4)

## 2017-06-02 MED ORDER — MORPHINE SULFATE ER 15 MG PO TBCR
15.0000 mg | EXTENDED_RELEASE_TABLET | Freq: Two times a day (BID) | ORAL | Status: DC
  Administered 2017-06-02 – 2017-06-03 (×2): 15 mg via ORAL
  Filled 2017-06-02 (×2): qty 1

## 2017-06-02 MED ORDER — MORPHINE SULFATE (PF) 2 MG/ML IV SOLN
2.0000 mg | INTRAVENOUS | Status: DC | PRN
  Administered 2017-06-03: 2 mg via INTRAVENOUS
  Filled 2017-06-02: qty 1

## 2017-06-02 MED ORDER — SULFAMETHOXAZOLE-TRIMETHOPRIM 800-160 MG PO TABS
1.0000 | ORAL_TABLET | Freq: Two times a day (BID) | ORAL | Status: DC
  Administered 2017-06-02 – 2017-06-03 (×2): 1 via ORAL
  Filled 2017-06-02 (×2): qty 1

## 2017-06-02 MED ORDER — MORPHINE SULFATE (PF) 2 MG/ML IV SOLN
2.0000 mg | INTRAVENOUS | Status: DC | PRN
  Administered 2017-06-02: 2 mg via INTRAVENOUS
  Filled 2017-06-02: qty 1

## 2017-06-02 MED ORDER — OLANZAPINE 2.5 MG PO TABS
2.5000 mg | ORAL_TABLET | Freq: Every day | ORAL | Status: DC
  Filled 2017-06-02: qty 1

## 2017-06-02 MED ORDER — ALUM & MAG HYDROXIDE-SIMETH 200-200-20 MG/5ML PO SUSP
15.0000 mL | Freq: Four times a day (QID) | ORAL | Status: DC | PRN
  Administered 2017-06-02 (×2): 15 mL via ORAL
  Filled 2017-06-02 (×2): qty 30

## 2017-06-02 NOTE — Care Management Important Message (Signed)
Important Message  Patient Details  Name: William [name] MRN: [id number] Date of Birth: 07/24/1934   Medicare Important Message Given:  Yes    Carles Collet, RN 06/02/2017, 10:21 AM

## 2017-06-02 NOTE — Progress Notes (Signed)
Clinical Social Worker met with patient and family at bedside. Family had wanted to talk to [place] away from patient as to not upset him. Family stated they were interested in SNF and wanted to view their options before making a final decision. CSW gave family a list of SNF that have made a bed offer on behalf of patient and family stated they will look into facility. Family stated they will also like information on hospice in the home. CSW reach out to [employer] from [facility] to give family information on the possibility of going home with hospice.  Rhea Pink, MSW,  [place]

## 2017-06-02 NOTE — Progress Notes (Signed)
PT Cancellation Note  Patient Details Name: William [name] MRN: [id number] DOB: 1934/09/22   Cancelled Treatment:    Reason Eval/Treat Not Completed: Other (comment) MD and RN in room at current time. Will check back as schedule allows.   Leighton Ruff, PT, DPT  Acute Rehabilitation Services  Pager: [phone]    Rudean Hitt 06/02/2017, 11:35 AM

## 2017-06-02 NOTE — Progress Notes (Signed)
William [name] Progress Note    Assessment/ Plan:   1. Volume overload- does not appear to be related to Nephrotic syndrome or recurrent renal failure. Agree with cardiac workup as well as liver disease. He would also benefit from protein supplementation due to his hypoalbuminemia which may be contributing to his anasarca. Will also check TSH and await ECHO results (does have history of diastolic dysfunction) 1. Agree with diuretics and follow I's/O's and daily weights 2. Net negative over 2 liters. Would suggest changing IV lasix to 40mg  po bid and follow given his brisk response to 60mg  iv bid --> if renal function worse in AM and UOP still suboptimal then will incr to 80/40 or another IV dose of Lasix. 3. Will check UPEP for completeness, however low urine prot/creat ratio so not nephrotic syndrome. 4. Await ECHO results 2. AKI/CKD- due to bilateral obstructive uropathy s/p bilateral PNT placement. Functioning well. 3. Elevated TSH- will check free T3 and T4 as this may be contributing. 4. Protein malnutrition- supplement 5. DM type 2 - per primary svc 6. HTN- stable 7. A fib 8. Obstructive uropathy- PNT's patent, f/u with Urology and IR 9. Anemia of chronic disease 10. Cholelithiasis  11. FTT 12. Deconditioning- recently discharged from [facility]. Nsg to assess PT needs  Subjective:   He denies any complaints but family is bedside and state he often denies problems. He appears to be mildly dyspneic. Denies f/c/n/v.  Very poor appetite. I&O 185-940 = -755 /24hrs.   Objective:   BP (!) 132/58 (BP Location: Right Arm)   Pulse 67   Temp 97.7 F (36.5 C) (Oral)   Resp 20   Ht 6\' 1"  (1.854 m)   Wt 92.2 kg (203 lb 3.2 oz)   SpO2 96%   BMI 26.81 kg/m   Intake/Output Summary (Last 24 hours) at 06/02/17 0913 Last data filed at 06/02/17 0830  Gross per 24 hour  Intake              425 ml  Output              565 ml  Net             -140 ml   Weight change: -2.676  kg (-5 lb 14.4 oz)  Physical Exam: Gen:WD WN WM in NAD CVS: no rub Resp:CTA Abd: benign Ext: 1+ pitting lower ext edema  Imaging: Ct Head Wo Contrast  Result Date: 06/01/2017 CLINICAL DATA:  83 year old male with history of altered mental status. EXAM: CT HEAD WITHOUT CONTRAST TECHNIQUE: Contiguous axial images were obtained from the base of the skull through the vertex without intravenous contrast. COMPARISON:  CT the head 10/26/2016. FINDINGS: Brain: Mild cerebral atrophy. Patchy and confluent areas of decreased attenuation are noted throughout the deep and periventricular white matter of the cerebral hemispheres bilaterally, compatible with chronic microvascular ischemic disease. More focal wall low-attenuation in the cortical and subcortical region of the left frontal lobe, similar to prior study 10/26/2016, presumably related to remote infarct. No evidence of acute infarction, hemorrhage, hydrocephalus, extra-axial collection or mass lesion/mass effect. Vascular: No hyperdense vessel or unexpected calcification. Skull: Normal. Negative for fracture or focal lesion. Sinuses/Orbits: No acute finding. Other: None. IMPRESSION: 1. No acute intracranial abnormalities. 2. Mild cerebral atrophy with chronic ischemic changes, similar to prior study from 10/26/2016, as above. Electronically Signed   By: Vinnie Langton M.D.   On: 06/01/2017 12:20    Labs: BMET  Recent Labs Lab 06/17/2017 1614 05/31/17  2763 06/01/17 0240 06/02/17 0325  NA 134* 135 134* 134*  K 4.8 4.6 3.9 3.9  CL 99* 103 99* 96*  CO2 26 27 26 28   GLUCOSE 155* 99 201* 202*  BUN 22* 19 21* 28*  CREATININE 1.51* 1.36* 1.45* 1.56*  CALCIUM 9.5 8.6* 8.9 8.8*  PHOS  --   --   --  4.2   CBC  Recent Labs Lab 06/14/2017 1614 05/31/17 0737 06/01/17 0240 06/02/17 0325  WBC 8.2 5.8 6.1 7.5  NEUTROABS 5.7 3.8 3.9  --   HGB 12.2* 10.9* 11.3* 11.6*  HCT 37.6* 34.2* 35.2* 35.8*  MCV 92.8 94.0 94.4 93.0  PLT 249 222 226 199     Medications:    . amLODipine  5 mg Oral QHS  . apixaban  5 mg Oral BID  . feeding supplement (ENSURE ENLIVE)  237 mL Oral TID BM  . furosemide  40 mg Oral BID  . Gerhardt's butt cream   Topical BID  . insulin aspart  0-9 Units Subcutaneous TID WC  . insulin detemir  6 Units Subcutaneous QHS  . multivitamin  1 tablet Oral QHS  . pravastatin  40 mg Oral Q supper  . traZODone  100 mg Oral QHS  . valACYclovir  1,000 mg Oral BID  . zolpidem  5 mg Oral QHS      Otelia Santee, MD 06/02/2017, 9:13 AM

## 2017-06-02 NOTE — Progress Notes (Signed)
Palliative care progress note  Reason for consult: Goals of care  I met today with patient's family, including his wife, daughters, and son. I also met separately with patient and his wife.  When I joined family, they were already meeting with Cherly [place] from [facility].  We reviewed clinical course as well his family's perception that he continues to worsen and is now moving to a point of suffering.  Concepts specific to code status and goals of care this hospitalization discussed.  We discussed difference between a aggressive medical intervention path and a palliative, comfort focused care path.    His family feels that in light of his continued decline, the most important thing is to make sure that he is not suffering and they are also interested in working to get him medically stable enough to transition home with the support of hospice.  I then met with Mr. Naji and his wife. We reviewed his concerns that he is "just not feeling well."  We did not specifically discussed hospice, however, we began discussion that we may be facing problems that are not fixable and he is clear that his goal is to be as comfortable as possible and work to get back home.  We discussed using medications as needed for pain, anxiety, shortness of breath, or nausea as needed. He and his wife are in agreement medications for his comfort should not be held due to concern for side effects of these medications, including hypotension or respiratory depression, if he needs them for his comfort.  I also discussed with him heroic measures at the end-of-life and he states that he would not want aggressive interventions at the time of his death. I discussed changing CODE STATUS to DO NOT RESUSCITATE/DO NOT INTUBATE and he is wife were in agreement with this.  He reports that he has intermittent pain, but his family believes he is under reporting this. I discussed with him use of pain medications and he is agreeable to  starting low-dose long-acting agent to see if he is more restful this evening. We'll plan for addition of MS Contin 15 mg tonight.I did also increase the frequency of his as needed pain medication in case it becomes an issue this evening.  He reports being comfortable is secondary only to his family in importance to him.  Medications for pain or anxiety should not be held due to concern for adverse reactions if he needs them in order to be comfortable.  Questions and concerns addressed.   PMT will continue to support holistically.  Total time: 60 minutes Greater than 50%  of this time was spent counseling and coordinating care related to the above assessment and plan.  Micheline Rough, MD Wing Team [phone]

## 2017-06-02 NOTE — Progress Notes (Signed)
Patient will be able to sleep if the daughter allows him. Patient given Trazadone, Ambien, Tyelnol and morphine per daughter request. Nurse concerned about the daughter keep stimulating and waking patient up. Patient is feeling so sleepy, eyes are closing while speaking, yet daughter thinks he is in pain. Patient is alert and oriented. Nurse has been very sleepy and daughter stands in front of him and keep asking patient if he is in pain. Nurse will ask patient but patient denies being in pain. Daughter insist that pain medication be given to patient, that patient does not want to voice that he is in pain. Nurse have been in the room every 30 minutes to hour. Daughter calls out and patient will be attempting to sleep and will wake patient up. It will be difficult for patient to sleep ones the family is in the room.

## 2017-06-02 NOTE — Progress Notes (Signed)
Patient ID: William [name], [name]   DOB: 1934/01/27, 83 y.o.   MRN: [id number]  PROGRESS NOTE    William [name]  VVO:[id number] DOB: January 13, 1934 DOA: 06/22/2017 PCP: Jani Gravel, MD   Brief Narrative:  83 year old [name] with history of paroxysmal atrial fibrillation on anticoagulation, diabetes mellitus, grade 2 diastolic dysfunction who was admitted last month for obstructive uropathy requiring initial stents which failed and hence patient required bilateral nephrostomy tubes and was discharged to rehabilitation unit and subsequently discharged a few days ago from the rehabilitation unit and was readmitted yesterday for worsening shortness of breath and lower extremity edema; CT abdomen showing fluid overload with pleural effusion. He was started on intravenous Lasix. He was switched to oral Lasix after nephrology evaluation. His overall condition is not improving is also currently on intravenous antibiotics for presumed urinary tract infection. Palliative care has also evaluated the patient.  Assessment & Plan:   Principal Problem:   Acute pulmonary edema (HCC) Active Problems:   History of prostate cancer   Atrial fibrillation (HCC)   Stage 3 chronic kidney disease   Abdominal pain   Type 2 diabetes mellitus with renal complication (HCC)   Fluid overload   Palliative care by specialist   Acute bilateral low back pain without sciatica   Adult failure to thrive    1. Fluid overload/pulmonary edema - patient appears in anasarca. Rule out cardiac causes; follow-up 2-D echo.  Consultation and follow-up from nephrology has been appreciated. Intravenous Lasix has been changed to oral Lasix. Strict input and output, daily weights. Monitor creatinine. Last 2-D echo done in December 2017 showing EF of 60-65% with grade 2 diastolic dysfunction.  Lower extremity DUPLEX WAS NEGATIVE FOR DVT. Family thinks that patient is more short of breath. We will repeat chest x-ray with decubitus views. 2. Bilateral  flank pain with recent nephrostomy tubesplacement - CT scan did not show anything acute.  Spoke to on call urologist Dr. Pilar Jarvis on 05/31/2017 and reviewed the case with him. He thinks that since the CAT scan does not show any obstruction of the nephrostomy tubes, inpatient urology evaluation is not needed.  Patient can follow up with Dr. Junious Silk as an outpatient. IR consult appreciated. Urine culture is growing stenotrophomonas. This information was relayed  to on call urologist Dr. Diona Fanti who will see the patient in consultation for probable nephrostomy tubes change tomorrow. 3. Diabetes mellitus type 2 - continue Levemir and sliding scale insulin coverage 4. Paroxysmal atrial fibrillation - chads 2 vascular score more than 2. Patient is on Apixaban. 5. Normocytic normochromic anemia - hemoglobin stable. Has had colonoscopy in 2014 which showed radiation proctitis and also diverticulosis. 6. Cholelithiasis - no signs of acute cholecystitis. Follow clinically. 7.   UTI with stenotrophomonas growing and urine cultures - discontinue Rocephin. Start Bactrim,        Renally Dosed. follow creatinine closely 8. Moderate to severe malnutrition with hypo-albuminemia- nutrition consult 9. Probable acute shingles rash on the lower abdomen and groin: Continue Valtrex renally dosed         for 7 days total  10. Insomnia and very poor appetite: CAT scan of the head was negative for acute intracranial events; continue trazodone and Ambien. We'll start Zyprexa as recommended by palliative care. 11. Generalized deconditioning: Palliative care consultation has been appreciated. Patient's overall condition is not getting better and progressively getting worse. I had a long conversation again with the patient's wife and 3 daughters present at bedside and explained  the overall poor prognosis. They're still having discussions with family members and haven't decided about the CODE STATUS yet. Wife was requesting if Dr.  Sethi/neurologist could evaluate the patient. I called Dr. Clydene Fake office and was informed that he is away till Monday morning. I will inform the family with the same  12. Abdominal distention: X-ray of the abdomen  DVT prophylaxis:Apixaban. Code Status:Full code. Family Communication:Patient's wife and 3 daughters Disposition Plan:To be determined.  Consultants:Nephrology; spoke to urologist on call Dr. Pilar Jarvis on phone on 05/31/2017 and Dr. Diona Fanti on 06/02/17  Procedures: lower extremity duplex are pending negative for DVT on 06/01/2017; 2-D echo report is pending.  Antimicrobials: Rocephin from 05/31/2017    Subjective: Patient seen and examined at bedside. He is slightly awake but keeps falling asleep. Does not answer any questions. Family members at bedside state that he could hardly get get any sleep last night  Objective: Vitals:   06/01/17 2021 06/02/17 0411 06/02/17 0652 06/02/17 1233  BP: 126/64 (!) 132/58  (!) 149/69  Pulse: 80 67  89  Resp: 19 20    Temp: 97.8 F (36.6 C) 97.7 F (36.5 C)  98.2 F (36.8 C)  TempSrc: Oral Oral  Oral  SpO2: 93% 96%  98%  Weight:   92.2 kg (203 lb 3.2 oz)   Height:        Intake/Output Summary (Last 24 hours) at 06/02/17 1258 Last data filed at 06/02/17 0945  Gross per 24 hour  Intake              455 ml  Output              520 ml  Net              -65 ml   Filed Weights   05/31/17 0242 06/01/17 0533 06/02/17 0652  Weight: 95.1 kg (209 lb 9.6 oz) 94.8 kg (209 lb 1.6 oz) 92.2 kg (203 lb 3.2 oz)    Examination:  General exam: Appears deconditioned and drowsy  Respiratory system: Bilateral decreased breath sound at bases with scattered crackles Cardiovascular system: S1 & S2 heard, rate controlled  Gastrointestinal system: Abdomen is distended, soft and nontender. Normal bowel sounds heard. Central nervous system: Drowsy, keeps falling asleep. No focal neurological deficits. Moving extremities Extremities: No  cyanosis, clubbing' bilateral 1-2+ lower extremity pitting pedal edema improving since yesterday  Skin: Scattered vesicular/pustular rash over the left lower abdomen and some on the groin Lymph: No cervical lymph adenopathy    Data Reviewed: I have personally reviewed following labs and imaging studies  CBC:  Recent Labs Lab 05/27/17 0428 05/28/2017 1614 05/31/17 0737 06/01/17 0240 06/02/17 0325  WBC 8.8 8.2 5.8 6.1 7.5  NEUTROABS  --  5.7 3.8 3.9  --   HGB 10.8* 12.2* 10.9* 11.3* 11.6*  HCT 33.5* 37.6* 34.2* 35.2* 35.8*  MCV 95.2 92.8 94.0 94.4 93.0  PLT 279 249 222 226 462   Basic Metabolic Panel:  Recent Labs Lab 06/11/2017 1614 05/31/17 0737 06/01/17 0240 06/02/17 0325  NA 134* 135 134* 134*  K 4.8 4.6 3.9 3.9  CL 99* 103 99* 96*  CO2 26 27 26 28   GLUCOSE 155* 99 201* 202*  BUN 22* 19 21* 28*  CREATININE 1.51* 1.36* 1.45* 1.56*  CALCIUM 9.5 8.6* 8.9 8.8*  MG  --   --  1.8 1.7  PHOS  --   --   --  4.2   GFR: Estimated Creatinine Clearance: 42 mL/min (  A) (by C-G formula based on SCr of 1.56 mg/dL (H)). Liver Function Tests:  Recent Labs Lab 06/21/2017 1614 05/31/17 0737 06/01/17 0240 06/02/17 0325  AST 36 27 31 30   ALT 27 22 24 23   ALKPHOS 98 81 88 90  BILITOT 0.9 0.9 0.8 0.6  PROT 5.8* 5.0* 5.2* 5.4*  ALBUMIN 2.8* 2.3* 2.4* 2.5*   No results for input(s): LIPASE, AMYLASE in the last 168 hours. No results for input(s): AMMONIA in the last 168 hours. Coagulation Profile: No results for input(s): INR, PROTIME in the last 168 hours. Cardiac Enzymes:  Recent Labs Lab 05/31/17 0737 05/31/17 1140 05/31/17 1826  TROPONINI 0.08* 0.07* 0.07*   BNP (last 3 results) No results for input(s): PROBNP in the last 8760 hours. HbA1C: No results for input(s): HGBA1C in the last 72 hours. CBG:  Recent Labs Lab 06/01/17 1109 06/01/17 1612 06/01/17 2018 06/02/17 0656 06/02/17 1137  GLUCAP 125* 152* 179* 224* 189*   Lipid Profile: No results for input(s):  CHOL, HDL, LDLCALC, TRIG, CHOLHDL, LDLDIRECT in the last 72 hours. Thyroid Function Tests:  Recent Labs  05/31/17 1457 06/01/17 1050  TSH 7.023*  --   FREET4  --  1.08  T3FREE  --  2.3   Anemia Panel: No results for input(s): VITAMINB12, FOLATE, FERRITIN, TIBC, IRON, RETICCTPCT in the last 72 hours. Sepsis Labs:  Recent Labs Lab 06/19/2017 1630  LATICACIDVEN 1.97*    Recent Results (from the past 240 hour(s))  Urine culture     Status: Abnormal   Collection Time: 05/28/2017  8:25 PM  Result Value Ref Range Status   Specimen Description URINE, RANDOM  Final   Special Requests left nephrostomy tube ADD [id number]  Final   Culture (A)  Final    >=100,000 COLONIES/mL STENOTROPHOMONAS MALTOPHILIA 20,000 COLONIES/mL STAPHYLOCOCCUS SPECIES (COAGULASE NEGATIVE)    Report Status 06/02/2017 FINAL  Final   Organism ID, Bacteria STENOTROPHOMONAS MALTOPHILIA (A)  Final   Organism ID, Bacteria STAPHYLOCOCCUS SPECIES (COAGULASE NEGATIVE) (A)  Final      Susceptibility   Stenotrophomonas maltophilia - MIC*    LEVOFLOXACIN <=0.12 SENSITIVE Sensitive     TRIMETH/SULFA <=20 SENSITIVE Sensitive     * >=100,000 COLONIES/mL STENOTROPHOMONAS MALTOPHILIA   Staphylococcus species (coagulase negative) - MIC*    CIPROFLOXACIN 4 RESISTANT Resistant     GENTAMICIN 4 SENSITIVE Sensitive     NITROFURANTOIN <=16 SENSITIVE Sensitive     OXACILLIN >=4 RESISTANT Resistant     TETRACYCLINE >=16 RESISTANT Resistant     VANCOMYCIN 2 SENSITIVE Sensitive     TRIMETH/SULFA 20 SENSITIVE Sensitive     CLINDAMYCIN >=8 RESISTANT Resistant     RIFAMPIN <=0.5 SENSITIVE Sensitive     Inducible Clindamycin NEGATIVE Sensitive     * 20,000 COLONIES/mL STAPHYLOCOCCUS SPECIES (COAGULASE NEGATIVE)         Radiology Studies: Ct Head Wo Contrast  Result Date: 06/01/2017 CLINICAL DATA:  83 year old [name] with history of altered mental status. EXAM: CT HEAD WITHOUT CONTRAST TECHNIQUE: Contiguous axial images were obtained  from the base of the skull through the vertex without intravenous contrast. COMPARISON:  CT the head 10/26/2016. FINDINGS: Brain: Mild cerebral atrophy. Patchy and confluent areas of decreased attenuation are noted throughout the deep and periventricular white matter of the cerebral hemispheres bilaterally, compatible with chronic microvascular ischemic disease. More focal wall low-attenuation in the cortical and subcortical region of the left frontal lobe, similar to prior study 10/26/2016, presumably related to remote infarct. No evidence of acute infarction,  hemorrhage, hydrocephalus, extra-axial collection or mass lesion/mass effect. Vascular: No hyperdense vessel or unexpected calcification. Skull: Normal. Negative for fracture or focal lesion. Sinuses/Orbits: No acute finding. Other: None. IMPRESSION: 1. No acute intracranial abnormalities. 2. Mild cerebral atrophy with chronic ischemic changes, similar to prior study from 10/26/2016, as above. Electronically Signed   By: Vinnie Langton M.D.   On: 06/01/2017 12:20        Scheduled Meds: . amLODipine  5 mg Oral QHS  . apixaban  5 mg Oral BID  . feeding supplement (ENSURE ENLIVE)  237 mL Oral TID BM  . furosemide  40 mg Oral BID  . Gerhardt's butt cream   Topical BID  . insulin aspart  0-9 Units Subcutaneous TID WC  . insulin detemir  6 Units Subcutaneous QHS  . multivitamin  1 tablet Oral QHS  . pravastatin  40 mg Oral Q supper  . sulfamethoxazole-trimethoprim  1 tablet Oral Q12H  . traZODone  100 mg Oral QHS  . valACYclovir  1,000 mg Oral BID  . zolpidem  5 mg Oral QHS   Continuous Infusions:   LOS: 2 days        Aline August, MD [facility] Pager [phone]  If 7PM-7AM, please contact night-coverage [email or web address] Password [email or web address] 06/02/2017, 12:58 PM

## 2017-06-02 NOTE — Progress Notes (Signed)
  Echocardiogram 2D Echocardiogram has been performed.  William [name] 06/02/2017, 10:10 AM

## 2017-06-02 NOTE — Progress Notes (Signed)
Spoke with family at the bedside. They are interested in CIR. Patient has two week stay in CIR recently. PT rec SNF. Daughter also mentioned that palliative may consult. Reviewed admission process for CIR beginning with PT rec, and criteria of patient needing to tolerate 3 hours of PT a day. Family agrees that patient is "sicker" than he was w last admit. At this time, family awaiting neuro consult for diagnosis and assistance with deciding dispo plan. CM suggested familiarizing themselves with SNFs.  CM Family requested lists of SNFs to begin looking into options. Request passed on to Yoe.

## 2017-06-03 DIAGNOSIS — R52 Pain, unspecified: Secondary | ICD-10-CM

## 2017-06-03 LAB — CBC WITH DIFFERENTIAL/PLATELET
BASOS ABS: 0 10*3/uL (ref 0.0–0.1)
Basophils Relative: 0 %
EOS PCT: 0 %
Eosinophils Absolute: 0 10*3/uL (ref 0.0–0.7)
HEMATOCRIT: 35.2 % — AB (ref 39.0–52.0)
HEMOGLOBIN: 11.5 g/dL — AB (ref 13.0–17.0)
LYMPHS ABS: 0.9 10*3/uL (ref 0.7–4.0)
LYMPHS PCT: 9 %
MCH: 30.3 pg (ref 26.0–34.0)
MCHC: 32.7 g/dL (ref 30.0–36.0)
MCV: 92.6 fL (ref 78.0–100.0)
Monocytes Absolute: 0.7 10*3/uL (ref 0.1–1.0)
Monocytes Relative: 7 %
NEUTROS ABS: 8 10*3/uL — AB (ref 1.7–7.7)
Neutrophils Relative %: 84 %
Platelets: 206 10*3/uL (ref 150–400)
RBC: 3.8 MIL/uL — AB (ref 4.22–5.81)
RDW: 15.4 % (ref 11.5–15.5)
WBC: 9.7 10*3/uL (ref 4.0–10.5)

## 2017-06-03 LAB — COMPREHENSIVE METABOLIC PANEL
ALK PHOS: 87 U/L (ref 38–126)
ALT: 21 U/L (ref 17–63)
AST: 30 U/L (ref 15–41)
Albumin: 2.5 g/dL — ABNORMAL LOW (ref 3.5–5.0)
Anion gap: 14 (ref 5–15)
BILIRUBIN TOTAL: 0.8 mg/dL (ref 0.3–1.2)
BUN: 38 mg/dL — AB (ref 6–20)
CALCIUM: 9.6 mg/dL (ref 8.9–10.3)
CHLORIDE: 94 mmol/L — AB (ref 101–111)
CO2: 27 mmol/L (ref 22–32)
CREATININE: 1.89 mg/dL — AB (ref 0.61–1.24)
GFR, EST AFRICAN AMERICAN: 37 mL/min — AB (ref >60)
GFR, EST NON AFRICAN AMERICAN: 32 mL/min — AB (ref >60)
Glucose, Bld: 264 mg/dL — ABNORMAL HIGH (ref 65–99)
Potassium: 4.5 mmol/L (ref 3.5–5.1)
Sodium: 135 mmol/L (ref 135–145)
TOTAL PROTEIN: 5.5 g/dL — AB (ref 6.5–8.1)

## 2017-06-03 LAB — BASIC METABOLIC PANEL
ANION GAP: 13 (ref 5–15)
BUN: 48 mg/dL — ABNORMAL HIGH (ref 6–20)
CALCIUM: 9.8 mg/dL (ref 8.9–10.3)
CO2: 31 mmol/L (ref 22–32)
Chloride: 92 mmol/L — ABNORMAL LOW (ref 101–111)
Creatinine, Ser: 2.25 mg/dL — ABNORMAL HIGH (ref 0.61–1.24)
GFR, EST AFRICAN AMERICAN: 30 mL/min — AB (ref >60)
GFR, EST NON AFRICAN AMERICAN: 26 mL/min — AB (ref >60)
GLUCOSE: 261 mg/dL — AB (ref 65–99)
Potassium: 4.9 mmol/L (ref 3.5–5.1)
Sodium: 136 mmol/L (ref 135–145)

## 2017-06-03 LAB — MAGNESIUM: MAGNESIUM: 1.9 mg/dL (ref 1.7–2.4)

## 2017-06-03 LAB — GLUCOSE, CAPILLARY
GLUCOSE-CAPILLARY: 236 mg/dL — AB (ref 65–99)
Glucose-Capillary: 256 mg/dL — ABNORMAL HIGH (ref 65–99)
Glucose-Capillary: 246 mg/dL — ABNORMAL HIGH (ref 65–99)

## 2017-06-03 MED ORDER — SORBITOL 70 % SOLN
30.0000 mL | Freq: Two times a day (BID) | Status: DC
  Administered 2017-06-03: 30 mL via ORAL
  Filled 2017-06-03 (×2): qty 30

## 2017-06-03 MED ORDER — VALACYCLOVIR HCL 500 MG PO TABS
1000.0000 mg | ORAL_TABLET | Freq: Every day | ORAL | Status: DC
  Filled 2017-06-03: qty 2

## 2017-06-03 MED ORDER — MORPHINE SULFATE (PF) 4 MG/ML IV SOLN
2.0000 mg | INTRAVENOUS | Status: DC | PRN
  Administered 2017-06-03: 4 mg via INTRAVENOUS
  Administered 2017-06-03: 2 mg via INTRAVENOUS
  Administered 2017-06-04: 4 mg via INTRAVENOUS
  Administered 2017-06-05 (×2): 1 mg via INTRAVENOUS
  Filled 2017-06-03 (×6): qty 1

## 2017-06-03 MED ORDER — MORPHINE SULFATE ER 15 MG PO TBCR
15.0000 mg | EXTENDED_RELEASE_TABLET | Freq: Two times a day (BID) | ORAL | Status: DC
  Administered 2017-06-03: 15 mg via ORAL
  Filled 2017-06-03: qty 1

## 2017-06-03 MED ORDER — SULFAMETHOXAZOLE-TRIMETHOPRIM 800-160 MG PO TABS: 1.0000 | ORAL_TABLET | Freq: Every day | ORAL | Status: DC

## 2017-06-03 MED ORDER — MORPHINE SULFATE (PF) 2 MG/ML IV SOLN
2.0000 mg | INTRAVENOUS | Status: DC | PRN
  Administered 2017-06-03: 2 mg via INTRAVENOUS
  Filled 2017-06-03: qty 1

## 2017-06-03 MED ORDER — BISACODYL 10 MG RE SUPP: 10.0000 mg | Freq: Every day | RECTAL | Status: DC | PRN

## 2017-06-03 MED ORDER — MORPHINE SULFATE (PF) 2 MG/ML IV SOLN: 2.0000 mg | INTRAVENOUS | Status: DC | PRN

## 2017-06-03 MED ORDER — POLYETHYLENE GLYCOL 3350 17 G PO PACK
17.0000 g | PACK | Freq: Two times a day (BID) | ORAL | Status: DC
  Administered 2017-06-03: 17 g via ORAL
  Filled 2017-06-03: qty 1

## 2017-06-03 MED ORDER — LORAZEPAM 1 MG PO TABS
1.0000 mg | ORAL_TABLET | Freq: Every day | ORAL | Status: DC
  Administered 2017-06-04: 1 mg via ORAL
  Filled 2017-06-03: qty 1

## 2017-06-03 MED ORDER — METHOCARBAMOL 1000 MG/10ML IJ SOLN
500.0000 mg | Freq: Three times a day (TID) | INTRAMUSCULAR | Status: DC
  Administered 2017-06-03 – 2017-06-04 (×3): 500 mg via INTRAVENOUS
  Filled 2017-06-03 (×9): qty 5

## 2017-06-03 MED ORDER — FUROSEMIDE 10 MG/ML IJ SOLN
80.0000 mg | Freq: Once | INTRAMUSCULAR | Status: AC
  Administered 2017-06-03: 80 mg via INTRAVENOUS
  Filled 2017-06-03: qty 8

## 2017-06-03 MED ORDER — SENNOSIDES-DOCUSATE SODIUM 8.6-50 MG PO TABS
1.0000 | ORAL_TABLET | Freq: Two times a day (BID) | ORAL | Status: DC
  Administered 2017-06-03: 1 via ORAL
  Filled 2017-06-03: qty 1

## 2017-06-03 NOTE — Progress Notes (Signed)
Late entry: assumed care from going RN @ [phone]; Patient was sitting up in bed and appeared in no acute distress; patient on room air; prior to shift, patient was given morphine; mid monring patient was assisted and given bath; consulting doctors round, hospitalist rounded, and family concerns addressed; @1043  patient given MS Contin as order; @1301  morphine given 2 mg per family request and patient verbal saying he is in pain; family members has more concerns and was addressed and discussed with charge nurse @ [phone]; patient has been sleeping since about 1430; wife @ bedside; patient will be transferred to [place]

## 2017-06-03 NOTE — Progress Notes (Signed)
Patient ID: William [name], male   DOB: Apr 06, 1934, 83 y.o.   MRN: [id number]  PROGRESS NOTE    William [name]  YBW:[id number] DOB: 1934/08/01 DOA: 06/18/2017 PCP: Jani Gravel, MD   Brief Narrative:  83 year old male with history of paroxysmal atrial fibrillation on anticoagulation, diabetes mellitus, grade 2 diastolic dysfunction who was admitted last month for obstructive uropathy requiring initial stents which failed and hence patient required bilateral nephrostomy tubes and was discharged to rehabilitation unit and subsequently discharged a few days ago from the rehabilitation unit and was readmitted yesterday for worsening shortness of breath and lower extremity edema; CT abdomen showing fluid overload with pleural effusion. He was started on intravenous Lasix. He was switched to oral Lasix after nephrology evaluation. His overall condition is not improving. Palliative care has also evaluated the patient.   Assessment & Plan:   Principal Problem:   Acute pulmonary edema (HCC) Active Problems:   History of prostate cancer   Atrial fibrillation (HCC)   Stage 3 chronic kidney disease   Abdominal pain   Type 2 diabetes mellitus with renal complication (HCC)   Fluid overload   Palliative care by specialist   Acute bilateral low back pain without sciatica   Adult failure to thrive   1. Fluid overload/pulmonary edema - patient appears in anasarca. Echo showed ejection fraction of 60-65%. Nephrology is following the patient. Currently on oral Lasix but urine output is worsening along with worsening creatinine Strict input and output, daily weights. Monitor creatinine. Lower extremity DUPLEX WAS NEGATIVE FOR DVT. Repeat success x-ray done yesterday did not show worsening pleural effusion, we will hold off on thoracentesis at this time. We will give intravenous morphine when necessary air hunger 2. Bilateral flank pain with recent nephrostomy tubesplacement - CT scan didnot show anything acute.  Spoke to Dr. Diona Fanti today who will probably do bedside cystoscopy and removal of stents. Bilateral nephrostomy tubes will need to be changed afterwards by interventional radiology. 3. Acute kidney injury on chronic kidney disease stage III: Creatinine is worsening with decreasing urine output. We'll follow up with further recommend is from nephrology 4. Diabetes mellitus type 2 - continue Levemir and sliding scale insulin coverage 5. Paroxysmal atrial fibrillation - currently rate controlled. Continue Apixaban. 6. Normocytic normochromic anemia - hemoglobin stable.  7. Cholelithiasis - no signs of acute cholecystitis. Follow clinically. 7.   UTI with stenotrophomonas growing in urine cultures - currently on Bactrim. Because of renal function, decrease the dose of Bactrim. If renal function worsens, we will have to switch to Levaquin.  8. Moderate to severe malnutrition with hypo-albuminemia- nutrition consult 9. Probable acute shingles rash on the lower abdomen and groin: Continue Valtrex renally dosed         for 7 days total. 10. Insomnia and very poor appetite: continue trazodone and Ambien and Ativan.  11. Generalized deconditioning: Overall condition is progressively getting worse. Palliative care follow-up has been appreciated. Patient's CODE STATUS has been changed to DO NOT RESUSCITATE. If condition worsens, patient will benefit from hospice/comfort measures. 12. Constipation: Start stool softeners and laxatives  DVT prophylaxis:Apixaban. Code Status:DO NOT RESUSCITATE. Family Communication:Patient's wife and daughters Disposition Plan:Probable home with home hospice  Consultants:Nephrology; urology; IR Procedures:lower extremity duplex are pending negative for DVT on 06/01/2017; 2-D echo report is pending. Echo done on 06/02/2017:  - Left ventricle: Wall thickness was increased in a pattern of   severe LVH. Systolic function was normal. The estimated ejection    fraction  was in the range of 60% to 65%. - Right atrium: The atrium was mildly dilated. - Pulmonary arteries: PA peak pressure: 39 mm Hg (S). - Pericardium, extracardiac: A trivial pericardial effusion was   identified. Antimicrobials: Rocephin from 05/31/2017 to 06/02/2017 Bactrim from 06/02/2017 onwards Valtrex from 06/01/2017 onwards   Subjective: Patient seen and examined at bedside. He is intermittently awake but drowsy. Family states that he still did not get much sleep overnight and are concerned about patient being more short of breath.  Objective: Vitals:   06/02/17 1233 06/02/17 2120 06/03/17 0343 06/03/17 0627  BP: (!) 149/69 130/72 126/76   Pulse: 89 90 85   Resp:  (!) 22 (!) 22   Temp: 98.2 F (36.8 C) 97.9 F (36.6 C) 97.9 F (36.6 C)   TempSrc: Oral Oral Oral   SpO2: 98% 99% 96%   Weight:    93.8 kg (206 lb 14.4 oz)  Height:        Intake/Output Summary (Last 24 hours) at 06/03/17 1108 Last data filed at 06/03/17 0813  Gross per 24 hour  Intake              136 ml  Output              365 ml  Net             -229 ml   Filed Weights   06/01/17 0533 06/02/17 0652 06/03/17 0627  Weight: 94.8 kg (209 lb 1.6 oz) 92.2 kg (203 lb 3.2 oz) 93.8 kg (206 lb 14.4 oz)    Examination:  General exam: Appears Drowsy and deconditioned Respiratory system: Bilateral decreased breath sound at bases with scattered crackles Cardiovascular system: S1 & S2 heard, rate controlled  Gastrointestinal system: Abdomen is nondistended, soft and nontender. Normal bowel sounds heard. Extremities: No cyanosis, clubbing; bilateral lower extremity 1-2+ pitting pedal edema      Data Reviewed: I have personally reviewed following labs and imaging studies  CBC:  Recent Labs Lab 05/29/2017 1614 05/31/17 0737 06/01/17 0240 06/02/17 0325 06/03/17 0154  WBC 8.2 5.8 6.1 7.5 9.7  NEUTROABS 5.7 3.8 3.9  --  8.0*  HGB 12.2* 10.9* 11.3* 11.6* 11.5*  HCT 37.6* 34.2* 35.2* 35.8* 35.2*    MCV 92.8 94.0 94.4 93.0 92.6  PLT 249 222 226 199 466   Basic Metabolic Panel:  Recent Labs Lab 05/31/17 0737 06/01/17 0240 06/02/17 0325 06/03/17 0154 06/03/17 0910  NA 135 134* 134* 135 136  K 4.6 3.9 3.9 4.5 4.9  CL 103 99* 96* 94* 92*  CO2 27 26 28 27 31   GLUCOSE 99 201* 202* 264* 261*  BUN 19 21* 28* 38* 48*  CREATININE 1.36* 1.45* 1.56* 1.89* 2.25*  CALCIUM 8.6* 8.9 8.8* 9.6 9.8  MG  --  1.8 1.7 1.9  --   PHOS  --   --  4.2  --   --    GFR: Estimated Creatinine Clearance: 29.1 mL/min (A) (by C-G formula based on SCr of 2.25 mg/dL (H)). Liver Function Tests:  Recent Labs Lab 06/26/2017 1614 05/31/17 0737 06/01/17 0240 06/02/17 0325 06/03/17 0154  AST 36 27 31 30 30   ALT 27 22 24 23 21   ALKPHOS 98 81 88 90 87  BILITOT 0.9 0.9 0.8 0.6 0.8  PROT 5.8* 5.0* 5.2* 5.4* 5.5*  ALBUMIN 2.8* 2.3* 2.4* 2.5* 2.5*   No results for input(s): LIPASE, AMYLASE in the last 168 hours. No results for input(s): AMMONIA in the  last 168 hours. Coagulation Profile: No results for input(s): INR, PROTIME in the last 168 hours. Cardiac Enzymes:  Recent Labs Lab 05/31/17 0737 05/31/17 1140 05/31/17 1826  TROPONINI 0.08* 0.07* 0.07*   BNP (last 3 results) No results for input(s): PROBNP in the last 8760 hours. HbA1C: No results for input(s): HGBA1C in the last 72 hours. CBG:  Recent Labs Lab 06/02/17 0656 06/02/17 1137 06/02/17 1630 06/02/17 2117 06/03/17 0616  GLUCAP 224* 189* 217* 239* 256*   Lipid Profile: No results for input(s): CHOL, HDL, LDLCALC, TRIG, CHOLHDL, LDLDIRECT in the last 72 hours. Thyroid Function Tests:  Recent Labs  05/31/17 1457 06/01/17 1050  TSH 7.023*  --   FREET4  --  1.08  T3FREE  --  2.3   Anemia Panel: No results for input(s): VITAMINB12, FOLATE, FERRITIN, TIBC, IRON, RETICCTPCT in the last 72 hours. Sepsis Labs:  Recent Labs Lab 06/07/2017 1630  LATICACIDVEN 1.97*    Recent Results (from the past 240 hour(s))  Urine  culture     Status: Abnormal   Collection Time: 06/24/2017  8:25 PM  Result Value Ref Range Status   Specimen Description URINE, RANDOM  Final   Special Requests left nephrostomy tube ADD [id number]  Final   Culture (A)  Final    >=100,000 COLONIES/mL STENOTROPHOMONAS MALTOPHILIA 20,000 COLONIES/mL STAPHYLOCOCCUS SPECIES (COAGULASE NEGATIVE)    Report Status 06/02/2017 FINAL  Final   Organism ID, Bacteria STENOTROPHOMONAS MALTOPHILIA (A)  Final   Organism ID, Bacteria STAPHYLOCOCCUS SPECIES (COAGULASE NEGATIVE) (A)  Final      Susceptibility   Stenotrophomonas maltophilia - MIC*    LEVOFLOXACIN <=0.12 SENSITIVE Sensitive     TRIMETH/SULFA <=20 SENSITIVE Sensitive     * >=100,000 COLONIES/mL STENOTROPHOMONAS MALTOPHILIA   Staphylococcus species (coagulase negative) - MIC*    CIPROFLOXACIN 4 RESISTANT Resistant     GENTAMICIN 4 SENSITIVE Sensitive     NITROFURANTOIN <=16 SENSITIVE Sensitive     OXACILLIN >=4 RESISTANT Resistant     TETRACYCLINE >=16 RESISTANT Resistant     VANCOMYCIN 2 SENSITIVE Sensitive     TRIMETH/SULFA 20 SENSITIVE Sensitive     CLINDAMYCIN >=8 RESISTANT Resistant     RIFAMPIN <=0.5 SENSITIVE Sensitive     Inducible Clindamycin NEGATIVE Sensitive     * 20,000 COLONIES/mL STAPHYLOCOCCUS SPECIES (COAGULASE NEGATIVE)         Radiology Studies: Dg Chest 2 View  Result Date: 06/02/2017 CLINICAL DATA:  83 year old male with nausea, vomiting and gas pain EXAM: CHEST  2 VIEW COMPARISON:  Prior chest x-ray 06/09/2017 FINDINGS: The lungs are clear and negative for focal airspace consolidation, pulmonary edema or suspicious pulmonary nodule. Blunting of the bilateral costophrenic angles with an air-fluid level on the lateral view consistent with bilateral small pleural effusions. There is associated bilateral lower lobe atelectasis. Cardiac and mediastinal contours are within normal limits. No acute fracture or lytic or blastic osseous lesions. The visualized upper abdominal  bowel gas pattern is unremarkable. IMPRESSION: 1. Small bilateral pleural effusions and associated lower lobe atelectasis. 2. Otherwise, no acute cardiopulmonary process. 3.  Aortic Atherosclerosis (ICD10-170.0) Electronically Signed   By: Jacqulynn Cadet M.D.   On: 06/02/2017 14:31   Dg Abd 1 View  Result Date: 06/02/2017 CLINICAL DATA:  83 year old male with nausea, vomiting and severe gas pains EXAM: ABDOMEN - 1 VIEW COMPARISON:  Prior CT abdomen/pelvis 05/31/2017 FINDINGS: Bilateral double-J ureteral stents appear in symmetric and anatomic position. The proximal locking loops are appropriately formed and overlies the upper pole  infundibulum while the distal locking loops are well formed in overlie the bladder. Bilateral percutaneous nephrostomy tubes are also present in well-positioned in the region of the renal pelves. No evidence of bowel obstruction. Contrast material, gas and stool are noted throughout the colon. Sigmoid diverticulosis is present. Radiopacity is overlying the lower pole of the left normal appendix incidentally noted in the right lower quadrant. No acute osseous abnormality. Multilevel degenerative changes throughout the spine. Kidney consistent with nephrolithiasis. IMPRESSION: 1. Well-positioned bilateral double-J ureteral stents and percutaneous nephrostomy tubes. 2. Left lower pole nephrolithiasis. 3. No evidence of bowel obstruction. 4. Sigmoid diverticulosis. 5. 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 Electronically Signed   By: Jacqulynn Cadet M.D.   On: 06/02/2017 14:29   Ct Head Wo Contrast  Result Date: 06/01/2017 CLINICAL DATA:  83 year old male with history of altered mental status. EXAM: CT HEAD WITHOUT CONTRAST TECHNIQUE: Contiguous axial images were obtained from the base of the skull through the vertex without intravenous contrast. COMPARISON:  CT the head 10/26/2016. FINDINGS: Brain: Mild cerebral atrophy. Patchy and confluent areas of  decreased attenuation are noted throughout the deep and periventricular white matter of the cerebral hemispheres bilaterally, compatible with chronic microvascular ischemic disease. More focal wall low-attenuation in the cortical and subcortical region of the left frontal lobe, similar to prior study 10/26/2016, presumably related to remote infarct. No evidence of acute infarction, hemorrhage, hydrocephalus, extra-axial collection or mass lesion/mass effect. Vascular: No hyperdense vessel or unexpected calcification. Skull: Normal. Negative for fracture or focal lesion. Sinuses/Orbits: No acute finding. Other: None. IMPRESSION: 1. No acute intracranial abnormalities. 2. Mild cerebral atrophy with chronic ischemic changes, similar to prior study from 10/26/2016, as above. Electronically Signed   By: Vinnie Langton M.D.   On: 06/01/2017 12:20   Dg Chest Bilateral Decubitus  Result Date: 06/02/2017 CLINICAL DATA:  Vomiting and nausea. EXAM: CHEST - BILATERAL DECUBITUS VIEW COMPARISON:  Chest x-ray from earlier the same day FINDINGS: Symmetric bilateral layering pleural effusions measuring approximately 3 cm in maximal depth when layering. Negative for pneumothorax. Lungs are relatively clear. Normal heart size. IMPRESSION: Symmetric bilateral small layering pleural effusions. Electronically Signed   By: Monte Fantasia M.D.   On: 06/02/2017 14:29        Scheduled Meds: . amLODipine  5 mg Oral QHS  . apixaban  5 mg Oral BID  . feeding supplement (ENSURE ENLIVE)  237 mL Oral TID BM  . furosemide  40 mg Oral BID  . Gerhardt's butt cream   Topical BID  . insulin aspart  0-9 Units Subcutaneous TID WC  . insulin detemir  6 Units Subcutaneous QHS  . LORazepam  1 mg Oral QHS  . morphine  15 mg Oral Q12H  . multivitamin  1 tablet Oral QHS  . polyethylene glycol  17 g Oral BID  . pravastatin  40 mg Oral Q supper  . senna-docusate  1 tablet Oral BID  . [START ON 06/04/2017] sulfamethoxazole-trimethoprim  1  tablet Oral Daily  . traZODone  100 mg Oral QHS  . [START ON 06/04/2017] valACYclovir  1,000 mg Oral Daily  . zolpidem  5 mg Oral QHS   Continuous Infusions:   LOS: 3 days        Aline August, MD [facility] Pager [phone]  If 7PM-7AM, please contact night-coverage [email or web address] Password [email or web address] 06/03/2017, 11:08 AM

## 2017-06-03 NOTE — Progress Notes (Signed)
Spoke with infection prevention specialist,Cherrie, regarding patient's probable acute shingles rash.  Cherrie advised that if rash is watery and blistered and weeping, patient should be placed on airborne and contact precautions otherwise he does not need to be on contact or airborne precautions.  At this time the patient's rash is scattered over his body and consists of small white pustules, some of which are drying. Pt will remain on contact isolation per MD order but he does not need airborne isolation per infection prevention guidance.

## 2017-06-03 NOTE — Consult Note (Signed)
Urology Consult  Consulting RS:WNIOE, K MD  CC: UTI, bilateral ureteral obstruction  HPI: This is a 83 year old male with progressive decline, now palliative medicine care, who has bilateral double-J stents as well as percutaneous nephrostomy tubes which were subsequently placed after dysfunction of the stents.  He is growing Therapist, sports and a staph species from the urine.  The patient is being prepared for eventual discharge from the hospital for hospice/palliative care, and consult is obtained for possible removal of his double-J stents to eliminate a nidus of his infection.  The patient's family is understanding of his current situation.  They are worried that he has had low urinary output from both nephrostomy tubes over the past few hours.   PMH: Past Medical History:  Diagnosis Date  . Arthritis   . Diabetes mellitus without complication ([place])   . Hypercholesteremia   . Hypertension   . Kidney stones   . Prostate cancer [facility])    with radiation treatment  . Sleep apnea 2/14   "mild per patient" hasnt gotten CPAP machine  . Stroke [facility])     PSH: Past Surgical History:  Procedure Laterality Date  . ADENOIDECTOMY    . CYSTOSCOPY W/ URETERAL STENT PLACEMENT Bilateral 05/07/2017   Procedure: CYSTOSCOPY WITH BILATERAL RETROGRADE PYELOGRAM CYSTOGRAM BILATERAL URETERAL STENT PLACEMENT;  Surgeon: Festus Aloe, MD;  Location: [facility];  Service: Urology;  Laterality: Bilateral;  . FLEXIBLE SIGMOIDOSCOPY N/A 09/12/2013   Procedure: FLEXIBLE SIGMOIDOSCOPY;  Surgeon: Milus Banister, MD;  Location: [facility] ENDOSCOPY;  Service: Endoscopy;  Laterality: N/A;  . HERNIA REPAIR    . HOT HEMOSTASIS N/A 09/12/2013   Procedure: HOT HEMOSTASIS (ARGON PLASMA COAGULATION/BICAP);  Surgeon: Milus Banister, MD;  Location: [facility];  Service: Endoscopy;  Laterality: N/A;  . IR NEPHROSTOMY PLACEMENT LEFT  05/09/2017  . IR NEPHROSTOMY PLACEMENT RIGHT  05/09/2017  . LIPOMA EXCISION    . OTHER SURGICAL  HISTORY     s/p prostate radiation  . TONSILLECTOMY    . TOTAL KNEE ARTHROPLASTY Left 03/25/2014   Procedure: LEFT TOTAL KNEE ARTHROPLASTY;  Surgeon: [name], MD;  Location: [facility] ORS;  Service: Orthopedics;  Laterality: Left;  . TOTAL KNEE ARTHROPLASTY Right 05/06/2014   Procedure: RIGHT TOTAL KNEE ARTHROPLASTY;  Surgeon: [name], MD;  Location: [facility];  Service: Orthopedics;  Laterality: Right;    Allergies: Allergies  Allergen Reactions  . Glyburide Other (See Comments)    Hypoglycemia     Medications: Prescriptions Prior to Admission  Medication Sig Dispense Refill Last Dose  . acetaminophen (TYLENOL) 325 MG tablet Take 325 mg by mouth every 6 (six) hours as needed for mild pain.    06/12/2017 at Unknown time  . amLODipine (NORVASC) 5 MG tablet Take 1 tablet (5 mg total) by mouth at bedtime. 30 tablet 0 05/29/2017 at Unknown time  . apixaban (ELIQUIS) 5 MG TABS tablet Take 1 tablet (5 mg total) by mouth 2 (two) times daily. 60 tablet 0 05/31/2017 at 0830  . LEVEMIR FLEXTOUCH 100 UNIT/ML Pen Inject 6 Units into the skin at bedtime. 15 mL 11 05/29/2017 at Unknown time  . LORazepam (ATIVAN) 1 MG tablet Take 0.5-1 tablets (0.5-1 mg total) by mouth at bedtime as needed for anxiety or sleep. 10 tablet 0 06/01/2017 at Unknown time  . methocarbamol (ROBAXIN) 500 MG tablet Take 1 tablet (500 mg total) by mouth 2 (two) times daily as needed for muscle spasms. 60 tablet 0 05/29/2017 at Unknown time  .  multivitamin (RENA-VIT) TABS tablet Take 1 tablet by mouth at bedtime. 30 tablet 0 05/29/2017 at Unknown time  . nystatin cream (MYCOSTATIN) Apply topically 3 (three) times daily. 30 g 0 06/17/2017 at Unknown time  . pravastatin (PRAVACHOL) 40 MG tablet Take 1 tablet (40 mg total) by mouth daily with supper. 30 tablet 0 05/29/2017 at Unknown time  . traZODone (DESYREL) 50 MG tablet Take 50 mg by mouth at bedtime.   05/29/2017 at Unknown time  . loperamide (IMODIUM A-D) 2 MG tablet Take 2 mg by mouth 4 (four)  times daily as needed for diarrhea or loose stools.    at PRN     Social History: Social History   Social History  . Marital status: Married    Spouse name: N/A  . Number of children: N/A  . Years of education: N/A   Occupational History  . Not on file.   Social History Main Topics  . Smoking status: Former Smoker    Start date: 11/28/1982  . Smokeless tobacco: Never Used  . Alcohol use 0.6 oz/week    1 Shots of liquor per week     Comment: occasionally  . Drug use: No  . Sexual activity: Not on file   Other Topics Concern  . Not on file   Social History Narrative  . No narrative on file    Family History: Family History  Problem Relation Age of Onset  . Colon cancer Father   . Stroke Maternal Grandmother   . Esophageal cancer Neg Hx   . Rectal cancer Neg Hx   . Stomach cancer Neg Hx     Review of Systems: Positive:  A further 10 point review of systems was negative except what is listed in the HPI.  Physical Exam: @VITALS2 @ General: Lethargic, responds to stimuli Head:  Normocephalic.  Atraumatic. ENT:  EOMI.  Mucous membranes dry CV:  3-4+ bilateral lower extremity edema to the thighs  Abdomen: Distended .  Percutaneous tube sites look clean.   Neurologic: Somnolent   Studies:  Recent Labs     06/02/17  0325  06/03/17  0154  HGB  11.6*  11.5*  WBC  7.5  9.7  PLT  199  206    Recent Labs     06/02/17  0325  06/03/17  0154  NA  134*  135  K  3.9  4.5  CL  96*  94*  CO2  28  27  BUN  28*  38*  CREATININE  1.56*  1.89*  CALCIUM  8.8*  9.6  GFRNONAA  40*  32*  GFRAA  46*  37*     No results for input(s): INR, APTT in the last 72 hours.  Invalid input(s): PT   Invalid input(s): ABG  Both nephrostomy tubes were irrigated with 10 mL of saline.  No clots were obtained.  They irrigated and/out quite easily.  I'm not worried about clots/obstruction.    Assessment/plan:   Bilateral ureteral obstruction.  Initially treated with stents that  were not appropriately draining either kidney.  Subsequent placement of bilateral nephrostomy tubes was performed, these seem to be draining fine, although urinary output has declined significantly.  This is more likely related to the patient's medical condition/worsening renal function.  I spoke with the patient as well as his family members today.  I'm fine with removing his stents cystoscopically at his bedside.  This will hopefully be able to get him home and off of antibiotic management  sooner.  However, this will not, in my estimation, improve his life expectancy any.  Following stent extraction, if this is to be done, he will need percutaneous nephrostomy tubes changed to remove the old, possibly infected once as well.  Ectomy done by interventional radiology next week.  I will arrange to have the necessary equipment brought up to the patient's room on Sunday morning for cystoscopy and stent removal.  25 minutes spent face-to-face with the patient and his family, more than half of this time was in consultation.        Pager:[phone]

## 2017-06-03 NOTE — Progress Notes (Signed)
Pharmacy asked to renally adjust bactrim and valtrex doses as patient's renal function is worsening with sCr up to 2.25 and CrCl 4ml/min. Will change bactrim to 1 DS tab daily for CrCl 15-30 ml/min and will change valtrex to 1g daily for CrCl 10-29 ml/min. Will continue to follow and make further adjustments as needed.  Nena Jordan, PharmD, BCPS 06/03/2017, 11:00 AM

## 2017-06-03 NOTE — Progress Notes (Signed)
Pt transferred from 2W this pm. Alert, oriented and able to voice needs. Family at bedside. No concerns expressed during this shift

## 2017-06-03 NOTE — Progress Notes (Signed)
[facility] Progress Note    Assessment/ Plan:   1. Volume overload- does not appear to be related to Nephrotic syndrome or recurrent renal failure. Agree with cardiac workup as well as liver disease. He would also benefit from protein supplementation due to his hypoalbuminemia which may be contributing to his anasarca. Will also check TSH and await ECHO results (does have history of diastolic dysfunction) 1. Agree with diuretics and follow I's/O's and daily weights 2. Net negative over 2 liters. Would suggest changing IV lasix to 40mg  po bid and follow given his brisk response to 60mg  iv bid --> if renal function worse in AM and UOP still suboptimal then will incr to 80/40 or another IV dose of Lasix. 3. Will check UPEP for completeness, however low urine prot/creat ratio so not nephrotic syndrome.  Poor intake and not responding well to diuretics PO; renal function also worsening, likely as a result of ongoing infection, hemodynamics and possibly moving into ATN. Family members were bedside and he has been made comfort care per family request. I don't think it's wrong to give a dose of IV Lasix to see if he responds to help w the leg swelling which is causing some discomfort. Will give a single dose of Lasix 80mg  IV x1 and d/c the oral. With the gut edema he's unlikely to respond to the orals.   2. AKI/CKD- due to bilateral obstructive uropathy s/p bilateral PNT placement. Functioning well. 3. Elevated TSH- will check free T3 and T4 as this may be contributing. 4. Protein malnutrition- supplement 5. DM type 2 - per primary svc 6. HTN- stable 7. A fib 8. Obstructive uropathy- PNT's patent, f/u with Urology and IR 9. Anemia of chronic disease 10. Cholelithiasis  11. FTT 12. Deconditioning- recently discharged from [facility]. Nsg to assess PT needs  Subjective:   He denies any complaints but family is bedside and state he often denies problems. He appears to be mildly  dyspneic. Denies f/c/n/v.  Very poor appetite. I&O 290-365 = -75 /24hrs.   Objective:   BP 126/76 (BP Location: Right Arm)   Pulse 85   Temp 97.9 F (36.6 C) (Oral)   Resp (!) 22   Ht 6\' 1"  (1.854 m)   Wt 93.8 kg (206 lb 14.4 oz)   SpO2 96%   BMI 27.30 kg/m   Intake/Output Summary (Last 24 hours) at 06/03/17 1105 Last data filed at 06/03/17 0813  Gross per 24 hour  Intake              136 ml  Output              365 ml  Net             -229 ml   Weight change: 1.678 kg (3 lb 11.2 oz)  Physical Exam: Gen:WD WN WM in NAD CVS: no rub Resp:Distant Abd: benign Ext: 1-2+ pitting lower ext edema  Imaging: Dg Chest 2 View  Result Date: 06/02/2017 CLINICAL DATA:  83 year old male with nausea, vomiting and gas pain EXAM: CHEST  2 VIEW COMPARISON:  Prior chest x-ray 06/27/2017 FINDINGS: The lungs are clear and negative for focal airspace consolidation, pulmonary edema or suspicious pulmonary nodule. Blunting of the bilateral costophrenic angles with an air-fluid level on the lateral view consistent with bilateral small pleural effusions. There is associated bilateral lower lobe atelectasis. Cardiac and mediastinal contours are within normal limits. No acute fracture or lytic or blastic osseous lesions. The visualized upper abdominal  bowel gas pattern is unremarkable. IMPRESSION: 1. Small bilateral pleural effusions and associated lower lobe atelectasis. 2. Otherwise, no acute cardiopulmonary process. 3.  Aortic Atherosclerosis (ICD10-170.0) Electronically Signed   By: Jacqulynn Cadet M.D.   On: 06/02/2017 14:31   Dg Abd 1 View  Result Date: 06/02/2017 CLINICAL DATA:  82 year old male with nausea, vomiting and severe gas pains EXAM: ABDOMEN - 1 VIEW COMPARISON:  Prior CT abdomen/pelvis 05/31/2017 FINDINGS: Bilateral double-J ureteral stents appear in symmetric and anatomic position. The proximal locking loops are appropriately formed and overlies the upper pole infundibulum while the  distal locking loops are well formed in overlie the bladder. Bilateral percutaneous nephrostomy tubes are also present in well-positioned in the region of the renal pelves. No evidence of bowel obstruction. Contrast material, gas and stool are noted throughout the colon. Sigmoid diverticulosis is present. Radiopacity is overlying the lower pole of the left normal appendix incidentally noted in the right lower quadrant. No acute osseous abnormality. Multilevel degenerative changes throughout the spine. Kidney consistent with nephrolithiasis. IMPRESSION: 1. Well-positioned bilateral double-J ureteral stents and percutaneous nephrostomy tubes. 2. Left lower pole nephrolithiasis. 3. No evidence of bowel obstruction. 4. Sigmoid diverticulosis. 5. 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 Electronically Signed   By: Jacqulynn Cadet M.D.   On: 06/02/2017 14:29   Ct Head Wo Contrast  Result Date: 06/01/2017 CLINICAL DATA:  83 year old male with history of altered mental status. EXAM: CT HEAD WITHOUT CONTRAST TECHNIQUE: Contiguous axial images were obtained from the base of the skull through the vertex without intravenous contrast. COMPARISON:  CT the head 10/26/2016. FINDINGS: Brain: Mild cerebral atrophy. Patchy and confluent areas of decreased attenuation are noted throughout the deep and periventricular white matter of the cerebral hemispheres bilaterally, compatible with chronic microvascular ischemic disease. More focal wall low-attenuation in the cortical and subcortical region of the left frontal lobe, similar to prior study 10/26/2016, presumably related to remote infarct. No evidence of acute infarction, hemorrhage, hydrocephalus, extra-axial collection or mass lesion/mass effect. Vascular: No hyperdense vessel or unexpected calcification. Skull: Normal. Negative for fracture or focal lesion. Sinuses/Orbits: No acute finding. Other: None. IMPRESSION: 1. No acute intracranial  abnormalities. 2. Mild cerebral atrophy with chronic ischemic changes, similar to prior study from 10/26/2016, as above. Electronically Signed   By: Vinnie Langton M.D.   On: 06/01/2017 12:20   Dg Chest Bilateral Decubitus  Result Date: 06/02/2017 CLINICAL DATA:  Vomiting and nausea. EXAM: CHEST - BILATERAL DECUBITUS VIEW COMPARISON:  Chest x-ray from earlier the same day FINDINGS: Symmetric bilateral layering pleural effusions measuring approximately 3 cm in maximal depth when layering. Negative for pneumothorax. Lungs are relatively clear. Normal heart size. IMPRESSION: Symmetric bilateral small layering pleural effusions. Electronically Signed   By: Monte Fantasia M.D.   On: 06/02/2017 14:29    Labs: BMET  Recent Labs Lab 05/28/2017 1614 05/31/17 0737 06/01/17 0240 06/02/17 0325 06/03/17 0154 06/03/17 0910  NA 134* 135 134* 134* 135 136  K 4.8 4.6 3.9 3.9 4.5 4.9  CL 99* 103 99* 96* 94* 92*  CO2 26 27 26 28 27 31   GLUCOSE 155* 99 201* 202* 264* 261*  BUN 22* 19 21* 28* 38* 48*  CREATININE 1.51* 1.36* 1.45* 1.56* 1.89* 2.25*  CALCIUM 9.5 8.6* 8.9 8.8* 9.6 9.8  PHOS  --   --   --  4.2  --   --    CBC  Recent Labs Lab 06/14/2017 1614 05/31/17 0737 06/01/17 0240 06/02/17 0325 06/03/17  0154  WBC 8.2 5.8 6.1 7.5 9.7  NEUTROABS 5.7 3.8 3.9  --  8.0*  HGB 12.2* 10.9* 11.3* 11.6* 11.5*  HCT 37.6* 34.2* 35.2* 35.8* 35.2*  MCV 92.8 94.0 94.4 93.0 92.6  PLT 249 222 226 199 206    Medications:    . amLODipine  5 mg Oral QHS  . apixaban  5 mg Oral BID  . feeding supplement (ENSURE ENLIVE)  237 mL Oral TID BM  . furosemide  40 mg Oral BID  . Gerhardt's butt cream   Topical BID  . insulin aspart  0-9 Units Subcutaneous TID WC  . insulin detemir  6 Units Subcutaneous QHS  . LORazepam  1 mg Oral QHS  . morphine  15 mg Oral Q12H  . multivitamin  1 tablet Oral QHS  . polyethylene glycol  17 g Oral BID  . pravastatin  40 mg Oral Q supper  . senna-docusate  1 tablet Oral BID  .  [START ON 06/04/2017] sulfamethoxazole-trimethoprim  1 tablet Oral Daily  . traZODone  100 mg Oral QHS  . [START ON 06/04/2017] valACYclovir  1,000 mg Oral Daily  . zolpidem  5 mg Oral QHS      Otelia Santee, MD 06/03/2017, 11:05 AM

## 2017-06-03 NOTE — Progress Notes (Signed)
Daily Progress Note   Patient Name: William [name]       Date: 06/03/2017 DOB: [birth year]  Age: 83 y.o. MRN#: [id number] Attending Physician: Aline August, MD Primary Care Physician: Jani Gravel, MD Admit Date: 06/03/2017  Reason for Consultation/Follow-up: Non pain symptom management, Pain control and Psychosocial/spiritual support  Subjective: Patient has been experiencing a great deal of pain with associated shortness of breath. Patient is a stoic man his family   reports . He will deny pain but when position changes he will begin to exhibit shortness of breath; he also is unable to lay flat because of flank pain; he will verbalize discomfort and he has had severe insomnia with no sleep in 4 days per family report until this morning. Patient was started on MS Contin 15 mg twice daily by Dr. Domingo Cocking on 06/02/2017; he has received 2 doses.  Length of Stay: 3  Current Medications: Scheduled Meds:  . amLODipine  5 mg Oral QHS  . apixaban  5 mg Oral BID  . feeding supplement (ENSURE ENLIVE)  237 mL Oral TID BM  . Gerhardt's butt cream   Topical BID  . insulin aspart  0-9 Units Subcutaneous TID WC  . insulin detemir  6 Units Subcutaneous QHS  . LORazepam  1 mg Oral QHS  . morphine  15 mg Oral Q12H  . multivitamin  1 tablet Oral QHS  . pravastatin  40 mg Oral Q supper  . sorbitol  30 mL Oral BID  . [START ON 06/04/2017] sulfamethoxazole-trimethoprim  1 tablet Oral Daily  . traZODone  100 mg Oral QHS  . [START ON 06/04/2017] valACYclovir  1,000 mg Oral Daily  . zolpidem  5 mg Oral QHS    Continuous Infusions: . methocarbamol (ROBAXIN)  IV      PRN Meds: acetaminophen **OR** acetaminophen, alum & mag hydroxide-simeth, bisacodyl, morphine injection, ondansetron **OR** ondansetron (ZOFRAN)  IV  Physical Exam  Constitutional: He appears well-developed and well-nourished.  Critically ill appearing elderly man asleep in recliner  Cardiovascular: Normal rate.   Pulmonary/Chest:  Poor respiratory effort  Neurological:  Somnolent  Skin: Skin is warm and dry.  Psychiatric:  Unable to test  Nursing note and vitals reviewed.           Vital Signs: BP 126/76 (BP Location: Right Arm)  Pulse 85   Temp 97.9 F (36.6 C) (Oral)   Resp (!) 22   Ht 6\' 1"  (1.854 m)   Wt 93.8 kg (206 lb 14.4 oz)   SpO2 96%   BMI 27.30 kg/m  SpO2: SpO2: 96 % O2 Device: O2 Device: Not Delivered O2 Flow Rate: O2 Flow Rate (L/min): 2 L/min  Intake/output summary:  Intake/Output Summary (Last 24 hours) at 06/03/17 1535 Last data filed at 06/03/17 0813  Gross per 24 hour  Intake              136 ml  Output              365 ml  Net             -229 ml   LBM: Last BM Date: 06/24/2017 Baseline Weight: Weight: 91.6 kg (202 lb) Most recent weight: Weight: 93.8 kg (206 lb 14.4 oz)       Palliative Assessment/Data:    Flowsheet Rows     Most Recent Value  Intake Tab  Referral Department  Hospitalist  Unit at Time of Referral  Cardiac/Telemetry Unit  Palliative Care Primary Diagnosis  Cardiac  Date Notified  06/01/17  Palliative Care Type  New Palliative care  Reason for referral  Clarify Goals of Care  Date of Admission  05/29/2017  # of days IP prior to Palliative referral  2  Clinical Assessment  Psychosocial & Spiritual Assessment  Palliative Care Outcomes      Patient Active Problem List   Diagnosis Date Noted  . Palliative care by specialist   . Acute bilateral low back pain without sciatica   . Adult failure to thrive   . Abdominal pain 05/31/2017  . Acute pulmonary edema ([place]) 05/31/2017  . Type 2 diabetes mellitus with renal complication ([place]) 16/08/9603  . Fluid overload 05/31/2017  . Stage 3 chronic kidney disease   . Nephrostomy tube bleed ([place])   . Anxiety state   .  Sleep disturbance   . Labile blood glucose   . Debilitated 05/13/2017  . Protein-calorie malnutrition, severe 05/10/2017  . History of prostate cancer   . Hydronephrosis, bilateral   . AKI (acute kidney injury) ([place])   . Diabetes mellitus type 2 in nonobese ([facility])   . Atrial fibrillation ([place])   . Benign essential HTN   . Leukocytosis   . Acute blood loss anemia   . Hydronephrosis   . Acute renal failure (ARF) ([place]) 05/06/2017  . Acute lower UTI 05/06/2017  . Occult GI bleeding 05/06/2017  . Stroke ([place]) 10/26/2016  . Obese 05/07/2014  . Expected blood loss anemia 05/07/2014  . S/P right TKA 05/06/2014  . DJD (degenerative joint disease) of knee 03/25/2014  . DNR (do not resuscitate) discussion 03/19/2014  . Abnormal ECG 03/19/2014  . Hypertension   . IDDM (insulin dependent diabetes mellitus) ([place])   . Hypercholesteremia   . Arthritis   . Prostate cancer ([place])   . Sleep apnea     Palliative Care Assessment & Plan   Patient Profile: 83 year old male with history of paroxysmal atrial fibrillation on anticoagulation, diabetes mellitus, grade 2 diastolic dysfunction who was admitted last month for obstructive uropathy requiring initial stents which failed and hence patient required bilateral nephrostomy tubes and was discharged to rehabilitation unit and subsequently discharged a few days ago from the rehabilitation unit and was readmitted yesterday for worsening shortness of breath and lower extremity edema; CT abdomen showing fluid overload with pleural  effusion. He was started on intravenous Lasix. He was switched to oral Lasix after nephrology evaluation. His overall condition is not improving. Palliative care consulted for goals of care Family now reports that patient is symptom based approach, comfort care with the goal to have urostomy's stents changed  Tomorrow. After this family is hopeful to go home with hospice support in the home. Per chart review he is been evaluated by  [facility]  Assessment: Family has been very concerned about patient's level of distress, and feel that he is suffering. They were in favor of judicious symptom management including morphine on a scheduled basis as well as as-needed basis, medications to treat insomnia and anxiety. Per spouse, she wishes to stay the course for the next 24 hours in terms of morphine dosage. Introduced the concept of continuous infusion. Also discussed transfer to 6 N.  Recommendations/Plan:  Transfer to 6 N., palliative care. Staffed with attending who concurred  Continue with morphine extended release 15 mg twice daily; family requests that time speech change to 8 AM and 8 PM; continue with 2-4 mg IV morphine for breakthrough pain or dyspnea  We'll schedule IV Robaxin 500 mg every 8 hours to augment pain control  Per family, patient has not had a bowel movement in 4 days despite scheduled MiraLAX twice daily and senna twice daily; patient is also beginning to have difficulty swallowing. We'll DC MiraLAX and senna and start sorbitol 30 MLS twice daily. Dulcolax suppository as needed  Goals of Care and Additional Recommendations:  Limitations on Scope of Treatment: Avoid Hospitalization, Minimize Medications, Initiate Comfort Feeding, No Artificial Feeding, No Blood Transfusions, No Chemotherapy, No Hemodialysis, No Radiation, No Surgical Procedures and No Tracheostomy  Code Status:    Code Status Orders        Start     Ordered   06/02/17 1844  Do not attempt resuscitation (DNR)  Continuous    Question Answer Comment  In the event of cardiac or respiratory ARREST Do not call a "code blue"   In the event of cardiac or respiratory ARREST Do not perform Intubation, CPR, defibrillation or ACLS   In the event of cardiac or respiratory ARREST Use medication by any route, position, wound care, and other measures to relive pain and suffering. May use oxygen, suction and manual treatment of airway  obstruction as needed for comfort.      06/02/17 1846    Code Status History    Date Active Date Inactive Code Status Order ID Comments User Context   05/31/2017  6:39 AM 06/02/2017  6:46 PM Full Code [id number]  Rise Patience, MD Inpatient   05/13/2017  2:13 PM 05/13/2017  2:13 PM Full Code [id number]  Cathlyn Parsons, PA-C Inpatient   05/13/2017  2:13 PM 05/27/2017 11:57 AM Full Code [id number]  Cathlyn Parsons, PA-C Inpatient   05/06/2017  8:29 PM 05/13/2017  2:04 PM Full Code [id number]  Patrecia Pour, MD Inpatient   10/27/2016  1:04 AM 10/28/2016  7:29 PM Full Code [id number]  Theodis Blaze, MD Inpatient   05/06/2014  2:55 PM 05/07/2014  6:08 PM Full Code [id number]  Pricilla Loveless, PA-C Inpatient   03/25/2014  1:32 PM 03/26/2014  5:04 PM Full Code [id number]  Benedetto Goad, PA-C Inpatient    Advance Directive Documentation     Most Recent Value  Type of Advance Directive  Living will, Healthcare Power of Attorney  Pre-existing out of facility DNR order (yellow form  or pink MOST form)  -  "MOST" Form in Place?  -       Prognosis:   < 6 months in the setting of obstructive uropathy requiring nephrostomy tube placement, infection, worsening kidney function, decreased urine output, diastolic heart failure, diabetes, atrial fibrillation, protein calorie malnutrition with an albumin of 2.5  Discharge Planning:  Home with Hospice  Care plan was discussed with Dr. Domingo Cocking, Dr. Starla Link  Thank you for allowing the Palliative Medicine Team to assist in the care of this patient.   Time In: 1430 Time Out: 1510 Total Time 40 min Prolonged Time Billed  no       Greater than 50%  of this time was spent counseling and coordinating care related to the above assessment and plan.  Dory Horn, NP  Please contact Palliative Medicine Team phone at [phone] for questions and concerns.

## 2017-06-04 DIAGNOSIS — I4891 Unspecified atrial fibrillation: Secondary | ICD-10-CM

## 2017-06-04 LAB — CBC WITH DIFFERENTIAL/PLATELET
BASOS ABS: 0 10*3/uL (ref 0.0–0.1)
BASOS PCT: 0 %
Eosinophils Absolute: 0 10*3/uL (ref 0.0–0.7)
Eosinophils Relative: 0 %
HEMATOCRIT: 32.4 % — AB (ref 39.0–52.0)
Hemoglobin: 10.6 g/dL — ABNORMAL LOW (ref 13.0–17.0)
LYMPHS PCT: 7 %
Lymphs Abs: 0.9 10*3/uL (ref 0.7–4.0)
MCH: 30.5 pg (ref 26.0–34.0)
MCHC: 32.7 g/dL (ref 30.0–36.0)
MCV: 93.1 fL (ref 78.0–100.0)
MONO ABS: 2 10*3/uL — AB (ref 0.1–1.0)
Monocytes Relative: 14 %
NEUTROS ABS: 11.4 10*3/uL — AB (ref 1.7–7.7)
Neutrophils Relative %: 79 %
PLATELETS: 228 10*3/uL (ref 150–400)
RBC: 3.48 MIL/uL — ABNORMAL LOW (ref 4.22–5.81)
RDW: 15.5 % (ref 11.5–15.5)
WBC: 14.4 10*3/uL — AB (ref 4.0–10.5)

## 2017-06-04 LAB — BASIC METABOLIC PANEL
ANION GAP: 16 — AB (ref 5–15)
BUN: 72 mg/dL — ABNORMAL HIGH (ref 6–20)
CALCIUM: 9.6 mg/dL (ref 8.9–10.3)
CO2: 26 mmol/L (ref 22–32)
Chloride: 89 mmol/L — ABNORMAL LOW (ref 101–111)
Creatinine, Ser: 2.78 mg/dL — ABNORMAL HIGH (ref 0.61–1.24)
GFR calc non Af Amer: 20 mL/min — ABNORMAL LOW (ref >60)
GFR, EST AFRICAN AMERICAN: 23 mL/min — AB (ref >60)
Glucose, Bld: 228 mg/dL — ABNORMAL HIGH (ref 65–99)
POTASSIUM: 5.4 mmol/L — AB (ref 3.5–5.1)
Sodium: 131 mmol/L — ABNORMAL LOW (ref 135–145)

## 2017-06-04 LAB — GLUCOSE, CAPILLARY: GLUCOSE-CAPILLARY: 256 mg/dL — AB (ref 65–99)

## 2017-06-04 LAB — MAGNESIUM: Magnesium: 2.4 mg/dL (ref 1.7–2.4)

## 2017-06-04 MED ORDER — DIPHENHYDRAMINE HCL 50 MG/ML IJ SOLN: 12.5000 mg | Freq: Four times a day (QID) | INTRAMUSCULAR | Status: DC | PRN

## 2017-06-04 MED ORDER — LORAZEPAM 2 MG/ML IJ SOLN: 0.5000 mg | INTRAMUSCULAR | Status: DC | PRN

## 2017-06-04 MED ORDER — GLYCOPYRROLATE 0.2 MG/ML IJ SOLN
0.2000 mg | Freq: Three times a day (TID) | INTRAMUSCULAR | Status: DC
  Administered 2017-06-04: 0.2 mg via INTRAVENOUS
  Filled 2017-06-04: qty 1

## 2017-06-04 MED ORDER — SODIUM CHLORIDE 0.9 % IV SOLN
1.0000 mg/h | INTRAVENOUS | Status: DC
  Administered 2017-06-04: 1 mg/h via INTRAVENOUS
  Filled 2017-06-04: qty 5

## 2017-06-04 MED ORDER — LORAZEPAM 2 MG/ML IJ SOLN: 0.5000 mg | Freq: Every evening | INTRAMUSCULAR | Status: DC | PRN

## 2017-06-04 MED ORDER — GLYCOPYRROLATE 0.2 MG/ML IJ SOLN: 0.4000 mg | Freq: Four times a day (QID) | INTRAMUSCULAR | Status: DC | PRN

## 2017-06-04 NOTE — Progress Notes (Signed)
Patient ID: William [name], male   DOB: 1934-08-28, 83 y.o.   MRN: [id number]  PROGRESS NOTE    William [name]  DVV:[id number] DOB: 1934/09/18 DOA: 06/16/2017 PCP: Jani Gravel, MD   Brief Narrative:  83 year old male with history of paroxysmal atrial fibrillation on anticoagulation, diabetes mellitus, grade 2 diastolic dysfunction who was admitted last month for obstructive uropathy requiring initial stents which failed and hence patient required bilateral nephrostomy tubes and was discharged to rehabilitation unit and subsequently discharged a few days ago from the rehabilitation unit and was readmitted yesterday for worsening shortness of breath and lower extremity edema; CT abdomen showing fluid overload with pleural effusion. He was started on intravenous Lasix. He was switched to oral Lasix after nephrology evaluation. His overall condition is not improving. Palliative care has also evaluated the patient. Patient's condition did not improve and he has been made comfort measures.  Assessment & Plan:   Principal Problem:   Acute pulmonary edema (HCC) Active Problems:   History of prostate cancer   Atrial fibrillation (HCC)   Stage 3 chronic kidney disease   Abdominal pain   Type 2 diabetes mellitus with renal complication (HCC)   Fluid overload   Palliative care by specialist   Acute bilateral low back pain without sciatica   Adult failure to thrive   Pain    1. Fluid overload/pulmonary edema - patient appears in anasarca. Echo showed ejection fraction of 60-65%. Nephrology is following the patient. Currently on oral Lasix but urine output is worsening along with worsening creatinine Strict input and output, daily weights. Monitor creatinine. Lower extremity DUPLEX WAS NEGATIVE FOR DVT.  2. Bilateral flank pain with recent nephrostomy tubesplacement - CT scan didnot show anything acute. No further intervention as patient is comfort measures. 3. Acute kidney injury on chronic kidney  disease stage III: Creatinine is worsening with decreasing urine output. Stop checking labs. 4. Diabetes mellitus type 2 - discontinue insulin 5. Paroxysmal atrial fibrillation - discontinue Apixaban 6. Normocytic normochromic anemia -  7. Cholelithiasis - . 7. UTI with stenotrophomonas growing in urine cultures- discontinue antibiotic  8. Moderate to severe malnutrition with hypo-albuminemia- 9. Probable acute shingles rash on the lower abdomen and groin: Discontinue Valtrex  10. Insomnia and very poor appetite: Continue comfort measures 11. Generalized deconditioning: Continue comfort measures.  DVT prophylaxis: None Code Status:DO NOT RESUSCITATE. Family Communication:Patient's wife and daughtersat bedside  Disposition Plan:Unclear at this time  Consultants:Nephrology; urology; IR Procedures:lower extremity duplex are pending negative for DVT on 06/01/2017; 2-D echo report is pending. Echo done on 06/02/2017:  - Left ventricle: Wall thickness was increased in a pattern of severe LVH. Systolic function was normal. The estimated ejection fraction was in the range of 60% to 65%. - Right atrium: The atrium was mildly dilated. - Pulmonary arteries: PA peak pressure: 39 mm Hg (S). - Pericardium, extracardiac: A trivial pericardial effusion was identified. Antimicrobials: Rocephin from 05/31/2017 to 06/02/2017 Bactrim from 06/02/2017 - 06/04/2017  Valtrex from 06/01/2017 to 06/04/2017  Subjective: Patient seen and examined at bedside. He is almost unresponsive  Objective: Vitals:   06/02/17 2120 06/03/17 0343 06/03/17 0627 06/03/17 1701  BP: 130/72 126/76  (!) 158/92  Pulse: 90 85  90  Resp: (!) 22 (!) 22  16  Temp: 97.9 F (36.6 C) 97.9 F (36.6 C)  98.1 F (36.7 C)  TempSrc: Oral Oral  Oral  SpO2: 99% 96%  97%  Weight:   93.8 kg (206 lb 14.4 oz)  98.6 kg (217 lb 6.4 oz)  Height:    6\' 1"  (1.854 m)    Intake/Output Summary (Last 24 hours) at 06/04/17  1211 Last data filed at 06/04/17 0651  Gross per 24 hour  Intake                0 ml  Output              228 ml  Net             -228 ml   Filed Weights   06/02/17 0652 06/03/17 0627 06/03/17 1701  Weight: 92.2 kg (203 lb 3.2 oz) 93.8 kg (206 lb 14.4 oz) 98.6 kg (217 lb 6.4 oz)    Examination:  General exam: Appears calm and comfortable With intermittent dyspnea     Data Reviewed: I have personally reviewed following labs and imaging studies  CBC:  Recent Labs Lab 06/16/2017 1614 05/31/17 0737 06/01/17 0240 06/02/17 0325 06/03/17 0154 06/04/17 0435  WBC 8.2 5.8 6.1 7.5 9.7 14.4*  NEUTROABS 5.7 3.8 3.9  --  8.0* 11.4*  HGB 12.2* 10.9* 11.3* 11.6* 11.5* 10.6*  HCT 37.6* 34.2* 35.2* 35.8* 35.2* 32.4*  MCV 92.8 94.0 94.4 93.0 92.6 93.1  PLT 249 222 226 199 206 678   Basic Metabolic Panel:  Recent Labs Lab 06/01/17 0240 06/02/17 0325 06/03/17 0154 06/03/17 0910 06/04/17 0435  NA 134* 134* 135 136 131*  K 3.9 3.9 4.5 4.9 5.4*  CL 99* 96* 94* 92* 89*  CO2 26 28 27 31 26   GLUCOSE 201* 202* 264* 261* 228*  BUN 21* 28* 38* 48* 72*  CREATININE 1.45* 1.56* 1.89* 2.25* 2.78*  CALCIUM 8.9 8.8* 9.6 9.8 9.6  MG 1.8 1.7 1.9  --  2.4  PHOS  --  4.2  --   --   --    GFR: Estimated Creatinine Clearance: 25.8 mL/min (A) (by C-G formula based on SCr of 2.78 mg/dL (H)). Liver Function Tests:  Recent Labs Lab 06/12/2017 1614 05/31/17 0737 06/01/17 0240 06/02/17 0325 06/03/17 0154  AST 36 27 31 30 30   ALT 27 22 24 23 21   ALKPHOS 98 81 88 90 87  BILITOT 0.9 0.9 0.8 0.6 0.8  PROT 5.8* 5.0* 5.2* 5.4* 5.5*  ALBUMIN 2.8* 2.3* 2.4* 2.5* 2.5*   No results for input(s): LIPASE, AMYLASE in the last 168 hours. No results for input(s): AMMONIA in the last 168 hours. Coagulation Profile: No results for input(s): INR, PROTIME in the last 168 hours. Cardiac Enzymes:  Recent Labs Lab 05/31/17 0737 05/31/17 1140 05/31/17 1826  TROPONINI 0.08* 0.07* 0.07*   BNP (last 3  results) No results for input(s): PROBNP in the last 8760 hours. HbA1C: No results for input(s): HGBA1C in the last 72 hours. CBG:  Recent Labs Lab 06/02/17 2117 06/03/17 0616 06/03/17 1135 06/03/17 1747 06/04/17 0815  GLUCAP 239* 256* 246* 236* 256*   Lipid Profile: No results for input(s): CHOL, HDL, LDLCALC, TRIG, CHOLHDL, LDLDIRECT in the last 72 hours. Thyroid Function Tests: No results for input(s): TSH, T4TOTAL, FREET4, T3FREE, THYROIDAB in the last 72 hours. Anemia Panel: No results for input(s): VITAMINB12, FOLATE, FERRITIN, TIBC, IRON, RETICCTPCT in the last 72 hours. Sepsis Labs:  Recent Labs Lab 05/31/2017 1630  LATICACIDVEN 1.97*    Recent Results (from the past 240 hour(s))  Urine culture     Status: Abnormal   Collection Time: 06/04/2017  8:25 PM  Result Value Ref Range Status   Specimen Description  URINE, RANDOM  Final   Special Requests left nephrostomy tube ADD [id number]  Final   Culture (A)  Final    >=100,000 COLONIES/mL STENOTROPHOMONAS MALTOPHILIA 20,000 COLONIES/mL STAPHYLOCOCCUS SPECIES (COAGULASE NEGATIVE)    Report Status 06/02/2017 FINAL  Final   Organism ID, Bacteria STENOTROPHOMONAS MALTOPHILIA (A)  Final   Organism ID, Bacteria STAPHYLOCOCCUS SPECIES (COAGULASE NEGATIVE) (A)  Final      Susceptibility   Stenotrophomonas maltophilia - MIC*    LEVOFLOXACIN <=0.12 SENSITIVE Sensitive     TRIMETH/SULFA <=20 SENSITIVE Sensitive     * >=100,000 COLONIES/mL STENOTROPHOMONAS MALTOPHILIA   Staphylococcus species (coagulase negative) - MIC*    CIPROFLOXACIN 4 RESISTANT Resistant     GENTAMICIN 4 SENSITIVE Sensitive     NITROFURANTOIN <=16 SENSITIVE Sensitive     OXACILLIN >=4 RESISTANT Resistant     TETRACYCLINE >=16 RESISTANT Resistant     VANCOMYCIN 2 SENSITIVE Sensitive     TRIMETH/SULFA 20 SENSITIVE Sensitive     CLINDAMYCIN >=8 RESISTANT Resistant     RIFAMPIN <=0.5 SENSITIVE Sensitive     Inducible Clindamycin NEGATIVE Sensitive     * 20,000  COLONIES/mL STAPHYLOCOCCUS SPECIES (COAGULASE NEGATIVE)         Radiology Studies: Dg Chest 2 View  Result Date: 06/02/2017 CLINICAL DATA:  83 year old male with nausea, vomiting and gas pain EXAM: CHEST  2 VIEW COMPARISON:  Prior chest x-ray 05/28/2017 FINDINGS: The lungs are clear and negative for focal airspace consolidation, pulmonary edema or suspicious pulmonary nodule. Blunting of the bilateral costophrenic angles with an air-fluid level on the lateral view consistent with bilateral small pleural effusions. There is associated bilateral lower lobe atelectasis. Cardiac and mediastinal contours are within normal limits. No acute fracture or lytic or blastic osseous lesions. The visualized upper abdominal bowel gas pattern is unremarkable. IMPRESSION: 1. Small bilateral pleural effusions and associated lower lobe atelectasis. 2. Otherwise, no acute cardiopulmonary process. 3.  Aortic Atherosclerosis (ICD10-170.0) Electronically Signed   By: Jacqulynn Cadet M.D.   On: 06/02/2017 14:31   Dg Abd 1 View  Result Date: 06/02/2017 CLINICAL DATA:  83 year old male with nausea, vomiting and severe gas pains EXAM: ABDOMEN - 1 VIEW COMPARISON:  Prior CT abdomen/pelvis 05/31/2017 FINDINGS: Bilateral double-J ureteral stents appear in symmetric and anatomic position. The proximal locking loops are appropriately formed and overlies the upper pole infundibulum while the distal locking loops are well formed in overlie the bladder. Bilateral percutaneous nephrostomy tubes are also present in well-positioned in the region of the renal pelves. No evidence of bowel obstruction. Contrast material, gas and stool are noted throughout the colon. Sigmoid diverticulosis is present. Radiopacity is overlying the lower pole of the left normal appendix incidentally noted in the right lower quadrant. No acute osseous abnormality. Multilevel degenerative changes throughout the spine. Kidney consistent with nephrolithiasis.  IMPRESSION: 1. Well-positioned bilateral double-J ureteral stents and percutaneous nephrostomy tubes. 2. Left lower pole nephrolithiasis. 3. No evidence of bowel obstruction. 4. Sigmoid diverticulosis. 5. 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 Electronically Signed   By: Jacqulynn Cadet M.D.   On: 06/02/2017 14:29   Dg Chest Bilateral Decubitus  Result Date: 06/02/2017 CLINICAL DATA:  Vomiting and nausea. EXAM: CHEST - BILATERAL DECUBITUS VIEW COMPARISON:  Chest x-ray from earlier the same day FINDINGS: Symmetric bilateral layering pleural effusions measuring approximately 3 cm in maximal depth when layering. Negative for pneumothorax. Lungs are relatively clear. Normal heart size. IMPRESSION: Symmetric bilateral small layering pleural effusions. Electronically Signed   By: Angelica Chessman  Watts M.D.   On: 06/02/2017 14:29        Scheduled Meds: . Gerhardt's butt cream   Topical BID  . glycopyrrolate  0.2 mg Intravenous Q8H  . LORazepam  0.5 mg Intravenous QHS,MR X 1   Continuous Infusions: . methocarbamol (ROBAXIN)  IV Stopped (06/04/17 1206)  . morphine       LOS: 4 days        Aline August, MD [facility] Pager [phone]  If 7PM-7AM, please contact night-coverage [email or web address] Password [email or web address] 06/04/2017, 12:11 PM

## 2017-06-04 NOTE — Clinical Social Work Note (Signed)
Pt has been connected with [facility] care. Pt experienced a significant decline over last 2 days. Per Palliative note, discharge plan is home with hospice. CSW made RNCM aware. CSW signing off as no further Social Work needs identified. Please reconsult if new Social Work needs arise.   William [name], [place], [place] Work [phone]

## 2017-06-04 NOTE — Progress Notes (Signed)
Daily Progress Note   Patient Name: William [name]       Date: 06/04/2017 DOB: 09-21-1934  Age: 83 y.o. MRN#: [id number] Attending Physician: Aline August, MD Primary Care Physician: Jani Gravel, MD Admit Date: 06/06/2017  Reason for Consultation/Follow-up: Establishing goals of care, Non pain symptom management, Pain control, Psychosocial/spiritual support and Terminal Care  Subjective: Patient continues to decline rapidly. He appears more comfortable since scheduling IV Robaxin in addition to morphine extended release, as needed IV morphine.  He is now able to lie back in the bed and family reports that he is been asleep since 1:00 this morning. They are pleased with transfer to the new [facility] which is made it easier for his extended family to be with him during this time. Chart reviewed, in addition to morphine extended release 30 mg, he has used 10 mg of IV morphine in addition to scheduled IV 500 mg Robaxin every 8 hours. Scant, upper airway secretions forming.  Length of Stay: 4  Current Medications: Scheduled Meds:  . Gerhardt's butt cream   Topical BID  . glycopyrrolate  0.2 mg Intravenous Q8H  . LORazepam  0.5 mg Intravenous QHS,MR X 1    Continuous Infusions: . methocarbamol (ROBAXIN)  IV Stopped (06/04/17 1206)  . morphine 1 mg/hr (06/04/17 1221)    PRN Meds: bisacodyl, diphenhydrAMINE, glycopyrrolate, LORazepam, morphine injection, [DISCONTINUED] ondansetron **OR** ondansetron (ZOFRAN) IV  Physical Exam  Constitutional:  Acutely ill, elderly man; minimally responsive lying in the bed  HENT:  Head: Normocephalic.  Cardiovascular:  Irregular  Pulmonary/Chest: Effort normal.  Neurological:  Minimally responsive; nonverbal  Skin:  Cool, pale; no mottling noted    Psychiatric:  No agitation  Nursing note and vitals reviewed.           Vital Signs: BP (!) 158/92 (BP Location: Right Arm)   Pulse 90   Temp 98.1 F (36.7 C) (Oral)   Resp 16   Ht 6\' 1"  (1.854 m)   Wt 98.6 kg (217 lb 6.4 oz)   SpO2 97%   BMI 28.68 kg/m  SpO2: SpO2: 97 % O2 Device: O2 Device: Not Delivered O2 Flow Rate: O2 Flow Rate (L/min): 2 L/min  Intake/output summary:  Intake/Output Summary (Last 24 hours) at 06/04/17 1611 Last data filed at 06/04/17 0651  Gross per 24  hour  Intake                0 ml  Output              228 ml  Net             -228 ml   LBM: Last BM Date: 06/12/2017 Baseline Weight: Weight: 91.6 kg (202 lb) Most recent weight: Weight: 98.6 kg (217 lb 6.4 oz)       Palliative Assessment/Data:    Flowsheet Rows     Most Recent Value  Intake Tab  Referral Department  Hospitalist  Unit at Time of Referral  Cardiac/Telemetry Unit  Palliative Care Primary Diagnosis  Cardiac  Date Notified  06/01/17  Palliative Care Type  New Palliative care  Reason for referral  Clarify Goals of Care  Date of Admission  05/31/2017  # of days IP prior to Palliative referral  2  Clinical Assessment  Psychosocial & Spiritual Assessment  Palliative Care Outcomes      Patient Active Problem List   Diagnosis Date Noted  . Pain   . Palliative care by specialist   . Acute bilateral low back pain without sciatica   . Adult failure to thrive   . Abdominal pain 05/31/2017  . Acute pulmonary edema ([place]) 05/31/2017  . Type 2 diabetes mellitus with renal complication ([place]) 70/35/0093  . Fluid overload 05/31/2017  . Stage 3 chronic kidney disease   . Nephrostomy tube bleed ([place])   . Anxiety state   . Sleep disturbance   . Labile blood glucose   . Debilitated 05/13/2017  . Protein-calorie malnutrition, severe 05/10/2017  . History of prostate cancer   . Hydronephrosis, bilateral   . AKI (acute kidney injury) ([place])   . Diabetes mellitus type 2 in nonobese (HCC)    . Atrial fibrillation ([place])   . Benign essential HTN   . Leukocytosis   . Acute blood loss anemia   . Hydronephrosis   . Acute renal failure (ARF) ([place]) 05/06/2017  . Acute lower UTI 05/06/2017  . Occult GI bleeding 05/06/2017  . Stroke ([place]) 10/26/2016  . Obese 05/07/2014  . Expected blood loss anemia 05/07/2014  . S/P right TKA 05/06/2014  . DJD (degenerative joint disease) of knee 03/25/2014  . DNR (do not resuscitate) discussion 03/19/2014  . Abnormal ECG 03/19/2014  . Hypertension   . IDDM (insulin dependent diabetes mellitus) ([name])   . Hypercholesteremia   . Arthritis   . Prostate cancer ([place])   . Sleep apnea     Palliative Care Assessment & Plan   Patient Profile: 83 year old male with history of paroxysmal atrial fibrillation on anticoagulation, diabetes mellitus, grade 2 diastolic dysfunction who was admitted last month for obstructive uropathy requiring initial stents which failed and hence patient required bilateral nephrostomy tubes and was discharged to rehabilitation unit and subsequently discharged a few days ago from the rehabilitation unit and was readmitted yesterday for worsening shortness of breath and lower extremity edema; CT abdomen showing fluid overload with pleural effusion. He was started on intravenous Lasix. He was switched to oral Lasix after nephrology evaluation. His overall condition is not improving. Palliative care consulted for goals of care Family now reports that patient is symptom based approach, comfort care with the goal to have urostomy's stents changed  Tomorrow. After this family is hopeful to go home with hospice support in the home. Per chart review he is been evaluated by [facility]  Assessment:  83 year old male with history of paroxysmal atrial fibrillation on anticoagulation, diabetes mellitus, grade 2 diastolic dysfunction who was admitted last month for obstructive uropathy requiring initial stents which failed and hence  patient required bilateral nephrostomy tubes and was discharged to rehabilitation unit and subsequently discharged a few days ago from the rehabilitation unit and was readmitted yesterday for worsening shortness of breath and lower extremity edema; CT abdomen showing fluid overload with pleural effusion. He was started on intravenous Lasix. He was switched to oral Lasix after nephrology evaluation. His overall condition is not improving. Palliative care consulted for goals of care Family now reports that patient is symptom based approach, comfort care with the goal to have urostomy's stents changed  Tomorrow. After this family is hopeful to go home with hospice support in the home but given rapid clinical decline since being admitted we are anticipating a hospital death at this point. Recommendations/Plan:  DO NOT RESUSCITATE DO NOT INTUBATE  We'll start morphine continuous infusion at 1 mg an hour based on review of previous 24 hour total daily dosage usage of morphine with a bolus of 1-2 mg every 30 minutes. Continue Robaxin 500 mg IV every 8 hours  DC PO medicine as patient is no longer able to swallow. Family is aware that he is stopping anticoagulation  No further blood sugar checks her insulin  We'll start scheduled Robinul IV 0.2 mg every 8 hours as well as PRN  Change oral Ativan to IV Ativan 0.5 mg to 1 mg every 4 hours as needed. Family requests that he continue to receive his scheduled dose of Ativan at bedtime, which I ordered at 0.5 mg at 8 PM and may repeat 1 if ineffective  Goals of Care and Additional Recommendations:  Limitations on Scope of Treatment: Full Comfort Care  Code Status:    Code Status Orders        Start     Ordered   06/02/17 1844  Do not attempt resuscitation (DNR)  Continuous    Question Answer Comment  In the event of cardiac or respiratory ARREST Do not call a "code blue"   In the event of cardiac or respiratory ARREST Do not perform Intubation,  CPR, defibrillation or ACLS   In the event of cardiac or respiratory ARREST Use medication by any route, position, wound care, and other measures to relive pain and suffering. May use oxygen, suction and manual treatment of airway obstruction as needed for comfort.      06/02/17 1846    Code Status History    Date Active Date Inactive Code Status Order ID Comments User Context   05/31/2017  6:39 AM 06/02/2017  6:46 PM Full Code [id number]  Rise Patience, MD Inpatient   05/13/2017  2:13 PM 05/13/2017  2:13 PM Full Code [id number]  Cathlyn Parsons, PA-C Inpatient   05/13/2017  2:13 PM 05/27/2017 11:57 AM Full Code [id number]  Cathlyn Parsons, PA-C Inpatient   05/06/2017  8:29 PM 05/13/2017  2:04 PM Full Code [id number]  Patrecia Pour, MD Inpatient   10/27/2016  1:04 AM 10/28/2016  7:29 PM Full Code [id number]  Theodis Blaze, MD Inpatient   05/06/2014  2:55 PM 05/07/2014  6:08 PM Full Code [id number]  Pricilla Loveless, PA-C Inpatient   03/25/2014  1:32 PM 03/26/2014  5:04 PM Full Code [id number]  Benedetto Goad, PA-C Inpatient    Advance Directive Documentation     Most Recent Value  Type of  Advance Directive  Living will, Healthcare Power of Attorney  Pre-existing out of facility DNR order (yellow form or pink MOST form)  -  "MOST" Form in Place?  -       Prognosis:   Hours - Days  Discharge Planning:  Anticipated Hospital Death  Patient would qualify for residential hospice but at this point appears to unstable to transport  Thank you for allowing the Palliative Medicine Team to assist in the care of this patient.   Time In: 1045 Time Out: 1120 Total Time 35 min Prolonged Time Billed  no       Greater than 50%  of this time was spent counseling and coordinating care related to the above assessment and plan.  Dory Horn, NP  Please contact Palliative Medicine Team phone at [phone] for questions and concerns.

## 2017-06-04 NOTE — Plan of Care (Signed)
Problem: Skin Integrity: Goal: Risk for impaired skin integrity will decrease Outcome: Not Met (add Reason) unresponsive

## 2017-06-04 NOTE — Progress Notes (Signed)
Subjective: Patient has had a fairly significant decline over the past day or 2.  His family understands that this may be a rapid process for him.  They are also of the opinion that aggressive management is not necessary.  Objective: Vital signs in last 24 hours: Temp:  [98.1 F (36.7 C)] 98.1 F (36.7 C) (07/07 1701) Pulse Rate:  [90] 90 (07/07 1701) Resp:  [16] 16 (07/07 1701) BP: (158)/(92) 158/92 (07/07 1701) SpO2:  [97 %] 97 % (07/07 1701) Weight:  [98.6 kg (217 lb 6.4 oz)] 98.6 kg (217 lb 6.4 oz) (07/07 1701)  Intake/Output from previous day: 07/07 0701 - 07/08 0700 In: 116 [P.O.:116] Out: 228 [Drains:228] Intake/Output this shift: No intake/output data recorded.    Lab Results:  Recent Labs  06/02/17 0325 06/03/17 0154 06/04/17 0435  HGB 11.6* 11.5* 10.6*  HCT 35.8* 35.2* 32.4*   BMET  Recent Labs  06/03/17 0910 06/04/17 0435  NA 136 131*  K 4.9 5.4*  CL 92* 89*  CO2 31 26  GLUCOSE 261* 228*  BUN 48* 72*  CREATININE 2.25* 2.78*  CALCIUM 9.8 9.6   No results for input(s): LABPT, INR in the last 72 hours. No results for input(s): LABURIN in the last 72 hours. Results for orders placed or performed during the hospital encounter of 06/07/2017  Urine culture     Status: Abnormal   Collection Time: 06/15/2017  8:25 PM  Result Value Ref Range Status   Specimen Description URINE, RANDOM  Final   Special Requests left nephrostomy tube ADD [id number]  Final   Culture (A)  Final    >=100,000 COLONIES/mL STENOTROPHOMONAS MALTOPHILIA 20,000 COLONIES/mL STAPHYLOCOCCUS SPECIES (COAGULASE NEGATIVE)    Report Status 06/02/2017 FINAL  Final   Organism ID, Bacteria STENOTROPHOMONAS MALTOPHILIA (A)  Final   Organism ID, Bacteria STAPHYLOCOCCUS SPECIES (COAGULASE NEGATIVE) (A)  Final      Susceptibility   Stenotrophomonas maltophilia - MIC*    LEVOFLOXACIN <=0.12 SENSITIVE Sensitive     TRIMETH/SULFA <=20 SENSITIVE Sensitive     * >=100,000 COLONIES/mL STENOTROPHOMONAS  MALTOPHILIA   Staphylococcus species (coagulase negative) - MIC*    CIPROFLOXACIN 4 RESISTANT Resistant     GENTAMICIN 4 SENSITIVE Sensitive     NITROFURANTOIN <=16 SENSITIVE Sensitive     OXACILLIN >=4 RESISTANT Resistant     TETRACYCLINE >=16 RESISTANT Resistant     VANCOMYCIN 2 SENSITIVE Sensitive     TRIMETH/SULFA 20 SENSITIVE Sensitive     CLINDAMYCIN >=8 RESISTANT Resistant     RIFAMPIN <=0.5 SENSITIVE Sensitive     Inducible Clindamycin NEGATIVE Sensitive     * 20,000 COLONIES/mL STAPHYLOCOCCUS SPECIES (COAGULASE NEGATIVE)    Studies/Results: Dg Chest 2 View  Result Date: 06/02/2017 CLINICAL DATA:  83 year old male with nausea, vomiting and gas pain EXAM: CHEST  2 VIEW COMPARISON:  Prior chest x-ray 06/03/2017 FINDINGS: The lungs are clear and negative for focal airspace consolidation, pulmonary edema or suspicious pulmonary nodule. Blunting of the bilateral costophrenic angles with an air-fluid level on the lateral view consistent with bilateral small pleural effusions. There is associated bilateral lower lobe atelectasis. Cardiac and mediastinal contours are within normal limits. No acute fracture or lytic or blastic osseous lesions. The visualized upper abdominal bowel gas pattern is unremarkable. IMPRESSION: 1. Small bilateral pleural effusions and associated lower lobe atelectasis. 2. Otherwise, no acute cardiopulmonary process. 3.  Aortic Atherosclerosis (ICD10-170.0) Electronically Signed   By: Jacqulynn Cadet M.D.   On: 06/02/2017 14:31   Dg Abd 1  View  Result Date: 06/02/2017 CLINICAL DATA:  83 year old male with nausea, vomiting and severe gas pains EXAM: ABDOMEN - 1 VIEW COMPARISON:  Prior CT abdomen/pelvis 05/31/2017 FINDINGS: Bilateral double-J ureteral stents appear in symmetric and anatomic position. The proximal locking loops are appropriately formed and overlies the upper pole infundibulum while the distal locking loops are well formed in overlie the bladder. Bilateral  percutaneous nephrostomy tubes are also present in well-positioned in the region of the renal pelves. No evidence of bowel obstruction. Contrast material, gas and stool are noted throughout the colon. Sigmoid diverticulosis is present. Radiopacity is overlying the lower pole of the left normal appendix incidentally noted in the right lower quadrant. No acute osseous abnormality. Multilevel degenerative changes throughout the spine. Kidney consistent with nephrolithiasis. IMPRESSION: 1. Well-positioned bilateral double-J ureteral stents and percutaneous nephrostomy tubes. 2. Left lower pole nephrolithiasis. 3. No evidence of bowel obstruction. 4. Sigmoid diverticulosis. 5. 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 Electronically Signed   By: Jacqulynn Cadet M.D.   On: 06/02/2017 14:29   Dg Chest Bilateral Decubitus  Result Date: 06/02/2017 CLINICAL DATA:  Vomiting and nausea. EXAM: CHEST - BILATERAL DECUBITUS VIEW COMPARISON:  Chest x-ray from earlier the same day FINDINGS: Symmetric bilateral layering pleural effusions measuring approximately 3 cm in maximal depth when layering. Negative for pneumothorax. Lungs are relatively clear. Normal heart size. IMPRESSION: Symmetric bilateral small layering pleural effusions. Electronically Signed   By: Monte Fantasia M.D.   On: 06/02/2017 14:29    Assessment/Plan:   Bilateral ureteral obstruction, urinary tract infection, percutaneous tubes and stents in bilaterally.  The patient has experienced rapid decline of mental and physical status.    At this point, with the patient in palliative care and experiencing a decline, I do not think that cystoscopy and stent extraction as necessary.  I discussed this with the patient's family, who is in agreement.  I answered his many questions as possible about his decline, and whether to leave him here for palliative care or have him go home.  The patient's family, at this point, is comfortable  with him staying here.   LOS: 4 days   Franchot Gallo M 06/04/2017, 8:28 AM

## 2017-06-04 NOTE — Care Management Note (Signed)
Case Management Note  Patient Details  Name: KEYNAN HEFFERN MRN: [id number] Date of Birth: Oct 26, 1934  Subjective/Objective:  83 y.o. M who was referred to [facility]  earlier in the week. Confirmed with Bambi, the rep for that agency, that she is following for pending discharge.  CM spoke with Starla Link, MD who tells me that Virgina Organ, NP for [facility] started pt on Morphine gtt this am as his condition has declined rapidly over the past few days. Will continue to follow for needs as they arise.                   Action/Plan:  CM will follow closely for disposition/discharge needs.    Expected Discharge Date:                  Expected Discharge Plan:  [place]  In-House Referral:     Discharge planning Services  CM Consult  Post Acute Care Choice:  Hospice Choice offered to:  NA  DME Arranged:    DME Agency:     HH Arranged:    [facility]  Status of Service:  Completed, signed off  If discussed at [place] of Stay Meetings, dates discussed:    Additional Comments:  Delrae Sawyers, RN 06/04/2017, 12:35 PM

## 2017-06-05 DIAGNOSIS — R0609 Other forms of dyspnea: Secondary | ICD-10-CM

## 2017-06-05 DIAGNOSIS — R52 Pain, unspecified: Secondary | ICD-10-CM

## 2017-06-05 LAB — UIFE/LIGHT CHAINS/TP QN, 24-HR UR
% BETA, URINE: 11.8 %
ALBUMIN, U: 51.2 %
ALPHA 1 URINE: 2.1 %
Alpha 2, Urine: 18.5 %
FREE LAMBDA LT CHAINS, UR: 8.16 mg/L — AB (ref 0.24–6.66)
Free Kappa/Lambda Ratio: 20.71 — ABNORMAL HIGH (ref 2.04–10.37)
Free Lt Chn Excr Rate: 169 mg/L — ABNORMAL HIGH (ref 1.35–24.19)
GAMMA GLOBULIN URINE: 16.3 %
TOTAL PROTEIN, URINE-UPE24: 157.1 mg/dL
TOTAL PROTEIN, URINE-UR/DAY: 1650 mg/(24.h) — AB (ref 30–150)
TOTAL VOLUME: 1050
Time: 24 hours

## 2017-06-05 MED ORDER — WHITE PETROLATUM GEL
Status: DC
Start: 2017-06-05 — End: 2017-06-06
  Filled 2017-06-05: qty 1

## 2017-06-08 DIAGNOSIS — R0600 Dyspnea, unspecified: Secondary | ICD-10-CM

## 2017-06-19 ENCOUNTER — Ambulatory Visit: Payer: Medicare Other | Admitting: Adult Health

## 2017-06-19 ENCOUNTER — Encounter: Payer: Medicare Other | Admitting: Oncology

## 2017-06-22 ENCOUNTER — Inpatient Hospital Stay: Payer: Medicare Other | Admitting: Physical Medicine & Rehabilitation

## 2017-06-23 ENCOUNTER — Ambulatory Visit: Payer: Medicare Other | Admitting: Podiatry

## 2017-06-28 ENCOUNTER — Ambulatory Visit: Payer: Medicare Other | Admitting: Neurology

## 2017-06-28 NOTE — Progress Notes (Signed)
Chaplain was paged for support of a family of decreased Pt. Chaplain entered room with family in the room. Wife was holding his hand while others gathered around the room. Wife told stories of the pt helping chaplain to get to know the Pt. Others in the room added information as needed. Wife invited other members to tell stories as well. Chaplain recognize the care giving of the wife who is a retired [employer] scientist.Other family came. Chaplain encouraged two daughters to exercise self care as he noticed their protective stance,   June 18, 2017 1900  Clinical Encounter Type  Visited With Family  Visit Type Death  Referral From Care management  Spiritual Encounters  Spiritual Needs Grief support  Stress Factors  Family Stress Factors Loss

## 2017-06-28 NOTE — Progress Notes (Signed)
[facility] notified. Reference #[id number] spoke with Marybelle Killings.

## 2017-06-28 NOTE — Consult Note (Signed)
   [facility] Inpatient Consult   06/27/2017  William [name] [birth year] [id number]  Patient evaluated for re-admission in the Medicare ACO.  Chart review reveals the patient is currently for comfort measures per Palliative Care notes. Will sign off. For questions, please contact:  Natividad Brood, RN BSN [facility] Liaison  [phone] business mobile phone Toll free office [phone]

## 2017-06-28 NOTE — Progress Notes (Signed)
220 ml of Morphine drip wasted in sink.  Witnessed by Hosie Spangle RN.

## 2017-06-28 NOTE — Discharge Summary (Signed)
Death Summary  William [name] FXT:[id number] DOB: [birth year] DOA: 06-28-17  PCP: Jani Gravel, MD  Admit date: June 28, 2017 Date of Death: 07-04-2017 Time of Death: [place] Hospital course: 83 year old male with history of paroxysmal atrial fibrillation on anticoagulation, diabetes mellitus, grade 2 diastolic dysfunction who was admitted last month for obstructive uropathy requiring initial stents which failed and hence patient required bilateral nephrostomy tubes and was discharged to rehabilitation unit and subsequently discharged a few days ago from the rehabilitation unit and was readmitted yesterday for worsening shortness of breath and lower extremity edema; CT abdomen showing fluid overload with pleural effusion. He was started on intravenous Lasix. He was switched to oral Lasix after nephrology evaluation. His overall condition was not improving. Palliative care evaluated the patient. He was initially started on intravenous Rocephin for urinary tract infection but his urine culture grew stenotrophomonas so he was switched to oral Bactrim. He was also started on oral Valtrex for vesicular/pustular rash more on his left groin and back. His overall condition progressively got worse with worsening renal function and decreased urine output. Urology also had evaluated the patient and was planning to remove the ureteric stent. Since his condition worsened, he was made comfort measures and he expired on 07-04-2017 at 1745.   Final Diagnoses:    1. Fluid overload/pulmonary edema -  2. Bilateral flank pain with recent nephrostomy tubesplacement  3. Acute kidney injury on chronic kidney disease stage III 4. Diabetes mellitus type 2 5. Paroxysmal atrial fibrillation  6. Normocytic normochromic anemia  7. Cholelithiasis  7. UTI with stenotrophomonas growing in urine cultures 8. Moderate to severe malnutrition with hypo-albuminemia 9. Probable acute shingles rash on the lower abdomen and groin 10.  Insomnia and very poor appetite 11. Generalized deconditioning  The results of significant diagnostics from this hospitalization (including imaging, microbiology, ancillary and laboratory) are listed below for reference.    Significant Diagnostic Studies: Ct Abdomen Pelvis Wo Contrast  Result Date: 05/31/2017 CLINICAL DATA:  Back pain.  Recent nephrostomy tubes. EXAM: CT ABDOMEN AND PELVIS WITHOUT CONTRAST TECHNIQUE: Multidetector CT imaging of the abdomen and pelvis was performed following the standard protocol without IV contrast. COMPARISON:  CT 04/14/2017 FINDINGS: Lower chest: Moderate bilateral pleural effusions and adjacent compressive atelectasis. Mild cardiomegaly with coronary artery calcifications. Minimal pericardial fluid. Hepatobiliary: No evidence of focal lesion allowing for lack contrast. Calcified gallstone within the gallbladder. No biliary dilatation. Small volume of perihepatic ascites. Pancreas: Generalized mesenteric edema, including about the pancreatic head. Spleen: Normal in size without focal abnormality. Adrenals/Urinary Tract: No adrenal nodule. Bilateral nephrostomy tubes, uncal in the renal pelvis. Bilateral ureteral stent placement with proximal pigtails in the renal pelvis, distal pigtails in the bladder. There is no hydronephrosis. Urinary bladder is completely decompressed. Minimal air in the renal collecting systems likely secondary to nephrostomy placement. No perinephric fluid collection. Nonobstructing stone in the lower left kidney is again seen. Stomach/Bowel: Stomach is physiologically distended. No obstruction, enteric contrast has reached the colon. Few scattered colonic diverticular without acute diverticulitis. Normal appendix. High-density barium within the sigmoid colon and sigmoid colonic diverticula from prior barium swallow. No definite colonic wall thickening. Vascular/Lymphatic: Aortic and branch atherosclerosis. Limited assessment for adenopathy given lack  contrast. No evidence of abdominopelvic adenopathy. Reproductive: Brachytherapy seeds in the prostate gland. Other: Small volume ala of abdominal ascites with fluid tracking in the pericolic gutters. Minimal mesenteric ascites with diffuse mesenteric edema. Ascites tracks within the left inguinal canal. No free air. No intra-abdominal  abscess. Mild body wall edema. Musculoskeletal: There are no acute or suspicious osseous abnormalities. IMPRESSION: 1. Bilateral nephrostomy tubes and ureteral stents. No hydronephrosis. Urinary bladder is completely decompressed. 2. Findings consistent with fluid overload with moderate pleural effusions, small volume intra-abdominal ascites and mesenteric edema. 3. Colonic diverticulosis without acute inflammation. 4. Aortic atherosclerosis. 5. Cholelithiasis. Electronically Signed   By: Jeb Levering M.D.   On: 05/31/2017 00:36   Dg Chest 2 View  Result Date: 06/02/2017 CLINICAL DATA:  83 year old male with nausea, vomiting and gas pain EXAM: CHEST  2 VIEW COMPARISON:  Prior chest x-ray 06/18/2017 FINDINGS: The lungs are clear and negative for focal airspace consolidation, pulmonary edema or suspicious pulmonary nodule. Blunting of the bilateral costophrenic angles with an air-fluid level on the lateral view consistent with bilateral small pleural effusions. There is associated bilateral lower lobe atelectasis. Cardiac and mediastinal contours are within normal limits. No acute fracture or lytic or blastic osseous lesions. The visualized upper abdominal bowel gas pattern is unremarkable. IMPRESSION: 1. Small bilateral pleural effusions and associated lower lobe atelectasis. 2. Otherwise, no acute cardiopulmonary process. 3.  Aortic Atherosclerosis (ICD10-170.0) Electronically Signed   By: Jacqulynn Cadet M.D.   On: 06/02/2017 14:31   Dg Chest 2 View  Result Date: 06/24/2017 CLINICAL DATA:  Shortness of breath for several days EXAM: CHEST  2 VIEW COMPARISON:  05/15/2017  FINDINGS: Cardiac shadow is stable. The lungs are well aerated bilaterally. Chronic blunting of the costophrenic angles is again seen. No focal infiltrate is noted. No acute bony abnormality is noted. IMPRESSION: Chronic changes without acute abnormality. Electronically Signed   By: Inez Catalina M.D.   On: 06/21/2017 16:39   Dg Chest 2 View  Result Date: 05/15/2017 CLINICAL DATA:  Chest pain and weakness. EXAM: CHEST  2 VIEW COMPARISON:  CT chest 04/14/2017 and chest radiograph 10/27/2014. FINDINGS: Trachea is midline. Heart size stable. Lungs are clear. Possible tiny bilateral pleural effusions. Flowing anterior osteophytosis in the thoracic spine. IMPRESSION: Possible tiny bilateral pleural effusions. Electronically Signed   By: Lorin Picket M.D.   On: 05/15/2017 12:22   Dg Abd 1 View  Result Date: 06/02/2017 CLINICAL DATA:  83 year old male with nausea, vomiting and severe gas pains EXAM: ABDOMEN - 1 VIEW COMPARISON:  Prior CT abdomen/pelvis 05/31/2017 FINDINGS: Bilateral double-J ureteral stents appear in symmetric and anatomic position. The proximal locking loops are appropriately formed and overlies the upper pole infundibulum while the distal locking loops are well formed in overlie the bladder. Bilateral percutaneous nephrostomy tubes are also present in well-positioned in the region of the renal pelves. No evidence of bowel obstruction. Contrast material, gas and stool are noted throughout the colon. Sigmoid diverticulosis is present. Radiopacity is overlying the lower pole of the left normal appendix incidentally noted in the right lower quadrant. No acute osseous abnormality. Multilevel degenerative changes throughout the spine. Kidney consistent with nephrolithiasis. IMPRESSION: 1. Well-positioned bilateral double-J ureteral stents and percutaneous nephrostomy tubes. 2. Left lower pole nephrolithiasis. 3. No evidence of bowel obstruction. 4. Sigmoid diverticulosis. 5. Multilevel degenerative  disc disease throughout the lumbar spine. Electronically Signed   By: Jacqulynn Cadet M.D.   On: 06/02/2017 14:29   Dg Abd 1 View  Result Date: 05/09/2017 CLINICAL DATA:  Evaluate stent placement. EXAM: ABDOMEN - 1 VIEW COMPARISON:  CT, 04/14/2017 FINDINGS: There are bilateral ureteral stents, which appear well-positioned, and are stable when compared to the retrograde ureteral pyelogram dated 05/07/2017. Two stones project in the lower pole the  left kidney, stable from the prior CT. No other evidence of renal stones. No evidence of a ureteral stone. Normal bowel gas pattern. No acute skeletal abnormality. IMPRESSION: Stable bilateral ureteral stents when compared to the previous retrograde new ureteral pyelogram. 2 nonobstructing stones in the lower pole the left kidney, also stable. Electronically Signed   By: Lajean Manes M.D.   On: 05/09/2017 12:53   Ct Head Wo Contrast  Result Date: 06/01/2017 CLINICAL DATA:  83 year old male with history of altered mental status. EXAM: CT HEAD WITHOUT CONTRAST TECHNIQUE: Contiguous axial images were obtained from the base of the skull through the vertex without intravenous contrast. COMPARISON:  CT the head 10/26/2016. FINDINGS: Brain: Mild cerebral atrophy. Patchy and confluent areas of decreased attenuation are noted throughout the deep and periventricular white matter of the cerebral hemispheres bilaterally, compatible with chronic microvascular ischemic disease. More focal wall low-attenuation in the cortical and subcortical region of the left frontal lobe, similar to prior study 10/26/2016, presumably related to remote infarct. No evidence of acute infarction, hemorrhage, hydrocephalus, extra-axial collection or mass lesion/mass effect. Vascular: No hyperdense vessel or unexpected calcification. Skull: Normal. Negative for fracture or focal lesion. Sinuses/Orbits: No acute finding. Other: None. IMPRESSION: 1. No acute intracranial abnormalities. 2. Mild cerebral  atrophy with chronic ischemic changes, similar to prior study from 10/26/2016, as above. Electronically Signed   By: Vinnie Langton M.D.   On: 06/01/2017 12:20   Dg Chest Bilateral Decubitus  Result Date: 06/02/2017 CLINICAL DATA:  Vomiting and nausea. EXAM: CHEST - BILATERAL DECUBITUS VIEW COMPARISON:  Chest x-ray from earlier the same day FINDINGS: Symmetric bilateral layering pleural effusions measuring approximately 3 cm in maximal depth when layering. Negative for pneumothorax. Lungs are relatively clear. Normal heart size. IMPRESSION: Symmetric bilateral small layering pleural effusions. Electronically Signed   By: Monte Fantasia M.D.   On: 06/02/2017 14:29   US Renal  Result Date: 05/08/2017 CLINICAL DATA:  Hydronephrosis.  History of prostate cancer. EXAM: RENAL / URINARY TRACT ULTRASOUND COMPLETE COMPARISON:  Ultrasound of May 06, 2017. FINDINGS: Right Kidney: Length: 12.9 cm. Echogenicity within normal limits. Right ureteral stent is not well visualized. No mass visualized. Moderate hydronephrosis is noted. Left Kidney: Length: 14.3 cm. Echogenicity within normal limits. No mass visualized. Moderate hydronephrosis is noted. Left ureteral stent is noted. Bladder: Foley catheter is noted in urinary bladder. Bladder appears to be otherwise normal for degree of distension. IMPRESSION: Moderate bilateral hydronephrosis is noted. Left ureteral stent is noted. Right ureteral stent is not well visualized. Electronically Signed   By: Marijo Conception, M.D.   On: 05/08/2017 12:29   Dg Swallowing Func-speech Pathology  Result Date: 05/25/2017 Objective Swallowing Evaluation: Type of Study: MBS-Modified Barium Swallow Study Patient Details Name: William [name] MRN: [id number] Date of Birth: [birth year] Today's Date: 05/25/2017 Time: No Data Recorded-No Data Recorded No Data Recorded Past Medical History: Past Medical History: Diagnosis Date . Arthritis  . Diabetes mellitus without complication ([place])  .  Hypercholesteremia  . Hypertension  . Kidney stones  . Prostate cancer [facility])   with radiation treatment . Sleep apnea 2/14  "mild per patient" hasnt gotten CPAP machine . Stroke [facility])  Past Surgical History: Past Surgical History: Procedure Laterality Date . ADENOIDECTOMY   . CYSTOSCOPY W/ URETERAL STENT PLACEMENT Bilateral 05/07/2017  Procedure: CYSTOSCOPY WITH BILATERAL RETROGRADE PYELOGRAM CYSTOGRAM BILATERAL URETERAL STENT PLACEMENT;  Surgeon: Festus Aloe, MD;  Location: [facility];  Service: Urology;  Laterality: Bilateral; . FLEXIBLE SIGMOIDOSCOPY N/A 09/12/2013  Procedure: FLEXIBLE SIGMOIDOSCOPY;  Surgeon: Milus Banister, MD;  Location: [facility];  Service: Endoscopy;  Laterality: N/A; . HERNIA REPAIR   . HOT HEMOSTASIS N/A 09/12/2013  Procedure: HOT HEMOSTASIS (ARGON PLASMA COAGULATION/BICAP);  Surgeon: Milus Banister, MD;  Location: [facility];  Service: Endoscopy;  Laterality: N/A; . IR NEPHROSTOMY PLACEMENT LEFT  05/09/2017 . IR NEPHROSTOMY PLACEMENT RIGHT  05/09/2017 . LIPOMA EXCISION   . OTHER SURGICAL HISTORY    s/p prostate radiation . TONSILLECTOMY   . TOTAL KNEE ARTHROPLASTY Left 03/25/2014  Procedure: LEFT TOTAL KNEE ARTHROPLASTY;  Surgeon: [name], MD;  Location: [facility];  Service: Orthopedics;  Laterality: Left; . TOTAL KNEE ARTHROPLASTY Right 05/06/2014  Procedure: RIGHT TOTAL KNEE ARTHROPLASTY;  Surgeon: [name], MD;  Location: [facility];  Service: Orthopedics;  Laterality: Right; No Data Recorded No Data Recorded Assessment / Plan / Recommendation CHL IP CLINICAL IMPRESSIONS 05/25/2017 Clinical Impression Pt continues to present with delay in swallow initiation to the level of the pyriforms with most liquids which at times leads to flash penetration of both thickened and thin liquids.  Pt did sense penetration but also had persistent throat clearing which did not correlate with airway invasion.  Wife reports pt has a prolonged history of persistent throat clearing and coughing both  with and without PO intake.  Recommend advancing pt back to thin liquids at this time wtih full supervision for use of small, controlled sips to minimize instances of penetration given known delay in swallow initiation.  Also recommend meds whole in puree.  Wife and pt both educated regarding results of study in pt's room immediately following evaluation.  All questions were answered to their satisfaction at this time.  SLP will continue to follow up for toleration of diet advancement.   SLP Visit Diagnosis Dysphagia, oropharyngeal phase (R13.12) \    Impact on safety and function Mild aspiration risk   No flowsheet data found.  No flowsheet data found. CHL IP DIET RECOMMENDATION 05/25/2017 SLP Diet Recommendations Regular solids;Thin liquid Liquid Administration via Cup Medication Administration Whole meds with puree Compensations Minimize environmental distractions;Slow rate;Small sips/bites Postural Changes Seated upright at 90 degrees   CHL IP OTHER RECOMMENDATIONS 05/25/2017 Recommended Consults Consider GI evaluation Oral Care Recommendations -- Other Recommendations --     No flowsheet data found.     CHL IP ORAL PHASE 05/25/2017 Oral Phase Impaired Oral - Pudding Teaspoon -- Oral - Pudding Cup -- Oral - Honey Teaspoon -- Oral - Honey Cup -- Oral - Nectar Teaspoon WFL Oral - Nectar Cup Premature spillage Oral - Nectar Straw -- Oral - Thin Teaspoon Premature spillage Oral - Thin Cup Premature spillage Oral - Thin Straw Premature spillage Oral - Puree -- Oral - Mech Soft -- Oral - Regular -- Oral - Multi-Consistency -- Oral - Pill Premature spillage Oral Phase - Comment --  CHL IP PHARYNGEAL PHASE 05/25/2017 Pharyngeal Phase Impaired Pharyngeal- Pudding Teaspoon -- Pharyngeal -- Pharyngeal- Pudding Cup -- Pharyngeal -- Pharyngeal- Honey Teaspoon -- Pharyngeal -- Pharyngeal- Honey Cup -- Pharyngeal -- Pharyngeal- Nectar Teaspoon WFL Pharyngeal -- Pharyngeal- Nectar Cup Delayed swallow initiation-pyriform sinuses  Pharyngeal Material enters airway, remains ABOVE vocal cords then ejected out Pharyngeal- Nectar Straw -- Pharyngeal -- Pharyngeal- Thin Teaspoon Delayed swallow initiation-pyriform sinuses Pharyngeal -- Pharyngeal- Thin Cup Delayed swallow initiation-pyriform sinuses;Penetration/Aspiration during swallow Pharyngeal Material enters airway, remains ABOVE vocal cords then ejected out Pharyngeal- Thin Straw Penetration/Aspiration during swallow Pharyngeal Material enters airway, remains ABOVE vocal cords then  ejected out Pharyngeal- Puree -- Pharyngeal -- Pharyngeal- Mechanical Soft -- Pharyngeal -- Pharyngeal- Regular -- Pharyngeal -- Pharyngeal- Multi-consistency -- Pharyngeal -- Pharyngeal- Pill Delayed swallow initiation-pyriform sinuses;Pharyngeal residue - valleculae Pharyngeal -- Pharyngeal Comment --  CHL IP CERVICAL ESOPHAGEAL PHASE 05/19/2017 Cervical Esophageal Phase Impaired Pudding Teaspoon -- Pudding Cup -- Honey Teaspoon -- Honey Cup -- Nectar Teaspoon -- Nectar Cup -- Nectar Straw -- Thin Teaspoon -- Thin Cup -- Thin Straw -- Puree -- Mechanical Soft -- Regular -- Multi-consistency -- Pill -- Cervical Esophageal Comment -- Emilio Math 05/25/2017, 10:16 PM              Dg Swallowing Func-speech Pathology  Result Date: 05/19/2017 Objective Swallowing Evaluation: Type of Study: MBS-Modified Barium Swallow Study Patient Details Name: William [name] MRN: [id number] Date of Birth: August 31, 1934 Today's Date: 05/19/2017 Time: [id number] No Data Recorded Past Medical History: Past Medical History: Diagnosis Date . Arthritis  . Diabetes mellitus without complication ([place])  . Hypercholesteremia  . Hypertension  . Kidney stones  . Prostate cancer [facility])   with radiation treatment . Sleep apnea 2/14  "mild per patient" hasnt gotten CPAP machine . Stroke [facility])  Past Surgical History: Past Surgical History: Procedure Laterality Date . ADENOIDECTOMY   . CYSTOSCOPY W/ URETERAL STENT PLACEMENT Bilateral 05/07/2017   Procedure: CYSTOSCOPY WITH BILATERAL RETROGRADE PYELOGRAM CYSTOGRAM BILATERAL URETERAL STENT PLACEMENT;  Surgeon: Festus Aloe, MD;  Location: [facility];  Service: Urology;  Laterality: Bilateral; . FLEXIBLE SIGMOIDOSCOPY N/A 09/12/2013  Procedure: FLEXIBLE SIGMOIDOSCOPY;  Surgeon: Milus Banister, MD;  [facility];  Service: Endoscopy;  Laterality: N/A; . HERNIA REPAIR   . HOT HEMOSTASIS N/A 09/12/2013  Procedure: HOT HEMOSTASIS (ARGON PLASMA COAGULATION/BICAP);  Surgeon: Milus Banister, MD;  Location: [facility];  Service: Endoscopy;  Laterality: N/A; . IR NEPHROSTOMY PLACEMENT LEFT  05/09/2017 . IR NEPHROSTOMY PLACEMENT RIGHT  05/09/2017 . LIPOMA EXCISION   . OTHER SURGICAL HISTORY    s/p prostate radiation . TONSILLECTOMY   . TOTAL KNEE ARTHROPLASTY Left 03/25/2014  Procedure: LEFT TOTAL KNEE ARTHROPLASTY;  Surgeon: [name], MD;  [facility];  Service: Orthopedics;  Laterality: Left; . TOTAL KNEE ARTHROPLASTY Right 05/06/2014  Procedure: RIGHT TOTAL KNEE ARTHROPLASTY;  Surgeon: [name], MD;  [facility];  Service: Orthopedics;  Laterality: Right; No Data Recorded No Data Recorded Assessment / Plan / Recommendation CHL IP CLINICAL IMPRESSIONS 05/19/2017 Clinical Impression Pt presents with moderate sensorimotor pharyngeal dysphagia c/b sensed aspiration of thin liquids d/t delayed swallow initiation and mild residue in vallecula and pyriform sinuses that was aspirated after the swallow. Pt with consistent premature spillage and delayed swallow initiation of thin liquids, nectar thick liquids, honey thick liquids, puree and dysphagia 2 textures. With thin liquids, pt demonstrated frank penetration during swallow and during second swallow of oral residuals. After swallow, residue from vallecula and pyriform sinuses spilled anteriorally and posteriorally into airway with aspiration of these residuals. Pt with delayed cough that was ineffective in clearing aspirated material. With  spoon boluses of nectar thick liquids, pt with better airway protection evidenced by swallow initiation at vallecula and the ability to contain residue safety within vallecular space. With large cup sips of nectar thick liquids, pt with frank penetration to cords and no evidence of clearing penetrates. Pt with right sided weakness from CVA in 09/2016 and therefore head turn to right was attempted with trials of nectar thick liquids by cup. However, this strategy didn't prevent frank penetration. Therefore, I  recommend pt consume nectar thick liquids by spoon with dysphagia 3 diet, medicine whole in puree and implement water protocol in between meals given sensed aspiration, risk of dehydration and no immediate history of pneumonia. Given family's report that pt's coughing began with CVA in 09/2016, pt's prognosis is guarded.  SLP Visit Diagnosis Dysphagia, oropharyngeal phase (R13.12) Attention and concentration deficit following -- Frontal lobe and executive function deficit following -- Impact on safety and function Moderate aspiration risk   No flowsheet data found.  No flowsheet data found. CHL IP DIET RECOMMENDATION 05/19/2017 SLP Diet Recommendations Dysphagia 3 ([employer]) solids;Nectar thick liquid;No mixed consistencies Liquid Administration via Spoon Medication Administration Whole meds with puree Compensations Minimize environmental distractions;Slow rate;Small sips/bites Postural Changes Seated upright at 90 degrees   CHL IP OTHER RECOMMENDATIONS 05/19/2017 Recommended Consults Consider GI evaluation Oral Care Recommendations Oral care BID;Staff/trained caregiver to provide oral care Other Recommendations Order thickener from pharmacy;Prohibited food (jello, ice cream, thin soups);Remove water pitcher;Have oral suction available   CHL IP FOLLOW UP RECOMMENDATIONS 10/27/2016 Follow up Recommendations None   No flowsheet data found.     CHL IP ORAL PHASE 05/19/2017 Oral Phase -- Oral - Pudding Teaspoon --  Oral - Pudding Cup -- Oral - Honey Teaspoon -- Oral - Honey Cup -- Oral - Nectar Teaspoon -- Oral - Nectar Cup -- Oral - Nectar Straw -- Oral - Thin Teaspoon Incomplete tongue to palate contact;Premature spillage Oral - Thin Cup Incomplete tongue to palate contact;Premature spillage Oral - Thin Straw Incomplete tongue to palate contact;Premature spillage Oral - Puree Premature spillage;Incomplete tongue to palate contact Oral - Mech Soft Premature spillage;Incomplete tongue to palate contact Oral - Regular -- Oral - Multi-Consistency -- Oral - Pill Premature spillage;Incomplete tongue to palate contact Oral Phase - Comment --  CHL IP PHARYNGEAL PHASE 05/19/2017 Pharyngeal Phase -- Pharyngeal- Pudding Teaspoon -- Pharyngeal -- Pharyngeal- Pudding Cup -- Pharyngeal -- Pharyngeal- Honey Teaspoon -- Pharyngeal -- Pharyngeal- Honey Cup -- Pharyngeal -- Pharyngeal- Nectar Teaspoon -- Pharyngeal -- Pharyngeal- Nectar Cup Penetration/Apiration after swallow Pharyngeal Material enters airway, remains ABOVE vocal cords and not ejected out Pharyngeal- Nectar Straw -- Pharyngeal -- Pharyngeal- Thin Teaspoon Delayed swallow initiation-vallecula;Reduced tongue base retraction;Penetration/Aspiration during swallow;Penetration/Apiration after swallow;Trace aspiration;Pharyngeal residue - valleculae;Pharyngeal residue - pyriform;Pharyngeal residue - cp segment Pharyngeal Material enters airway, passes BELOW cords and not ejected out despite cough attempt by patient Pharyngeal- Thin Cup Delayed swallow initiation-vallecula;Reduced tongue base retraction;Penetration/Aspiration during swallow;Penetration/Apiration after swallow;Trace aspiration;Pharyngeal residue - valleculae;Pharyngeal residue - pyriform;Pharyngeal residue - cp segment Pharyngeal Material enters airway, passes BELOW cords and not ejected out despite cough attempt by patient Pharyngeal- Thin Straw Delayed swallow initiation-vallecula;Reduced tongue base  retraction;Penetration/Aspiration during swallow;Penetration/Apiration after swallow;Trace aspiration;Pharyngeal residue - cp segment;Pharyngeal residue - pyriform;Pharyngeal residue - valleculae Pharyngeal Material enters airway, passes BELOW cords and not ejected out despite cough attempt by patient Pharyngeal- Puree Delayed swallow initiation-vallecula;Reduced tongue base retraction;Pharyngeal residue - valleculae Pharyngeal -- Pharyngeal- Mechanical Soft Delayed swallow initiation-vallecula;Reduced tongue base retraction;Pharyngeal residue - valleculae Pharyngeal -- Pharyngeal- Regular -- Pharyngeal -- Pharyngeal- Multi-consistency -- Pharyngeal -- Pharyngeal- Pill Delayed swallow initiation-vallecula;Penetration/Aspiration during swallow;Penetration/Apiration after swallow;Trace aspiration;Pharyngeal residue - valleculae;Pharyngeal residue - pyriform;Pharyngeal residue - cp segment Pharyngeal Material enters airway, passes BELOW cords and not ejected out despite cough attempt by patient Pharyngeal Comment Compensatory strategies, head turn and cough were ineffective  CHL IP CERVICAL ESOPHAGEAL PHASE 05/19/2017 Cervical Esophageal Phase Impaired Pudding Teaspoon -- Pudding Cup -- Honey Teaspoon -- Honey Cup -- Nectar Teaspoon --  Nectar Cup -- Nectar Straw -- Thin Teaspoon -- Thin Cup -- Thin Straw -- Puree -- Mechanical Soft -- Regular -- Multi-consistency -- Pill -- Cervical Esophageal Comment -- CHL IP GO 10/27/2016 Functional Assessment Tool Used clinician judgement Functional Limitations Motor speech Swallow Current Status [phone]) (None) Swallow Goal Status (U2353) (None) Swallow Discharge Status (I1443) (None) Motor Speech Current Status (X5400) [place] Motor Speech Goal Status (Q6761) [place] Motor Speech Goal Status (P5093) [facility] Spoken Language Comprehension Current Status (O6712) (None) Spoken Language Comprehension Goal Status (W5809) (None) Spoken Language Comprehension Discharge Status [phone]) (None) Spoken  Language Expression Current Status [phone]) (None) Spoken Language Expression Goal Status (Z7673) (None) Spoken Language Expression Discharge Status [phone]) (None) Attention Current Status (T0240) (None) Attention Goal Status (X7353) (None) Attention Discharge Status (G9924) (None) Memory Current Status (Q6834) (None) Memory Goal Status (H9622) (None) Memory Discharge Status (W9798) (None) Voice Current Status (X2119) (None) Voice Goal Status (E1740) (None) Voice Discharge Status (C1448) (None) Other Speech-Language Pathology Functional Limitation Current Status (J8563) (None) Other Speech-Language Pathology Functional Limitation Goal Status (J4970) (None) Other Speech-Language Pathology Functional Limitation Discharge Status [phone]) (None) Happi Overton 05/19/2017, 11:41 AM              Ir Nephrostomy Placement Left  Result Date: 05/09/2017 INDICATION: 83 year old male with acute renal failure and bilateral hydronephrosis. The patient has known left-sided nephrolithiasis, with retrograde pyelograms performed recently (05/07/2017) and bilateral ureteral stent placement. Despite stenting hydronephrosis has worsened with worsening renal failure. He has been referred for therapeutic bilateral percutaneous nephrostomy evaluation. EXAM: IR NEPHROSTOMY PLACEMENT LEFT IR NEPHROSTOMY PLACEMENT RIGHT COMPARISON:  None. MEDICATIONS: Ciprofloxacin 400 mg IV; The antibiotic was administered in an appropriate time frame prior to skin puncture. ANESTHESIA/SEDATION: Fentanyl 100 mcg IV; Versed 2.0 mg IV Moderate Sedation Time:  21 minutes The patient was continuously monitored during the procedure by the interventional radiology nurse under my direct supervision. CONTRAST:  15 cc - administered into the collecting system(s) FLUOROSCOPY TIME:  Fluoroscopy Time: 1 minutes 18 seconds (6 mGy). COMPLICATIONS: None PROCEDURE: Informed written consent was obtained from the patient after a thorough discussion of the procedural risks,  benefits and alternatives. All questions were addressed. Maximal Sterile Barrier Technique was utilized including caps, mask, sterile gowns, sterile gloves, sterile drape, hand hygiene and skin antiseptic. A timeout was performed prior to the initiation of the procedure. Left: Patient positioned prone position on the fluoroscopy table. Ultrasound survey of the left flank was performed with images stored and sent to PACs. The patient was then prepped and draped in the usual sterile fashion. 1% lidocaine was used to anesthetize the skin and subcutaneous tissues for local anesthesia. A Chiba needle was then used to access a posterior inferior calyx with ultrasound guidance. With spontaneous urine returned through the needle, passage of an 018 micro wire into the collecting system was performed under fluoroscopy. A small incision was made with an 11 blade scalpel, and the needle was removed from the wire. An Accustick system was then advanced over the wire into the collecting system under fluoroscopy. The metal stiffener and inner dilator were removed, and then a sample of fluid was aspirated through the 4 French outer sheath. Bentson wire was passed into the collecting system and the sheath removed. Ten French dilation of the soft tissues was performed. Using modified Seldinger technique, a 10 French pigtail catheter drain was placed over the Bentson wire. Wire and inner stiffener removed, and the pigtail was formed in the collecting system. Small amount of  contrast confirmed position of the catheter. Right: Ultrasound survey of the right flank was then performed with images stored and sent to PACs. The patient was then prepped and draped in the usual sterile fashion. 1% lidocaine was used to anesthetize the skin and subcutaneous tissues for local anesthesia. A Chiba needle was then used to access a posterior inferior calyx with ultrasound guidance. With spontaneous urine returned through the needle, passage of an 018  micro wire into the collecting system was performed under fluoroscopy. A small incision was made with an 11 blade scalpel, and the needle was removed from the wire. An Accustick system was then advanced over the wire into the collecting system under fluoroscopy. The metal stiffener and inner dilator were removed, and then a sample of fluid was aspirated through the 4 French outer sheath. Bentson wire was passed into the collecting system and the sheath removed. Ten French dilation of the soft tissues was performed. Using modified Seldinger technique, a 10 French pigtail catheter drain was placed over the Bentson wire. Wire and inner stiffener removed, and the pigtail was formed in the collecting system. Small amount of contrast confirmed position of the catheter. Patient tolerated the procedure well and remained hemodynamically stable throughout. No complications were encountered and no significant blood loss encountered IMPRESSION: Status post bilateral percutaneous nephrostomy placement. Separate culture was sent for each collecting system. Signed, Dulcy Fanny. Earleen Newport, DO [facility] Radiology Electronically Signed   By: Corrie Mckusick D.O.   On: 05/09/2017 14:35   Ir Nephrostomy Placement Right  Result Date: 05/09/2017 INDICATION: 83 year old male with acute renal failure and bilateral hydronephrosis. The patient has known left-sided nephrolithiasis, with retrograde pyelograms performed recently (05/07/2017) and bilateral ureteral stent placement. Despite stenting hydronephrosis has worsened with worsening renal failure. He has been referred for therapeutic bilateral percutaneous nephrostomy evaluation. EXAM: IR NEPHROSTOMY PLACEMENT LEFT IR NEPHROSTOMY PLACEMENT RIGHT COMPARISON:  None. MEDICATIONS: Ciprofloxacin 400 mg IV; The antibiotic was administered in an appropriate time frame prior to skin puncture. ANESTHESIA/SEDATION: Fentanyl 100 mcg IV; Versed 2.0 mg IV  Moderate Sedation Time:  21 minutes The patient was continuously monitored during the procedure by the interventional radiology nurse under my direct supervision. CONTRAST:  15 cc - administered into the collecting system(s) FLUOROSCOPY TIME:  Fluoroscopy Time: 1 minutes 18 seconds (6 mGy). COMPLICATIONS: None PROCEDURE: Informed written consent was obtained from the patient after a thorough discussion of the procedural risks, benefits and alternatives. All questions were addressed. Maximal Sterile Barrier Technique was utilized including caps, mask, sterile gowns, sterile gloves, sterile drape, hand hygiene and skin antiseptic. A timeout was performed prior to the initiation of the procedure. Left: Patient positioned prone position on the fluoroscopy table. Ultrasound survey of the left flank was performed with images stored and sent to PACs. The patient was then prepped and draped in the usual sterile fashion. 1% lidocaine was used to anesthetize the skin and subcutaneous tissues for local anesthesia. A Chiba needle was then used to access a posterior inferior calyx with ultrasound guidance. With spontaneous urine returned through the needle, passage of an 018 micro wire into the collecting system was performed under fluoroscopy. A small incision was made with an 11 blade scalpel, and the needle was removed from the wire. An Accustick system was then advanced over the wire into the collecting system under fluoroscopy. The metal stiffener and inner dilator were removed, and then a sample of fluid was aspirated through the 4 French outer sheath.  Bentson wire was passed into the collecting system and the sheath removed. Ten French dilation of the soft tissues was performed. Using modified Seldinger technique, a 10 French pigtail catheter drain was placed over the Bentson wire. Wire and inner stiffener removed, and the pigtail was formed in the collecting system. Small amount of contrast confirmed position of the  catheter. Right: Ultrasound survey of the right flank was then performed with images stored and sent to PACs. The patient was then prepped and draped in the usual sterile fashion. 1% lidocaine was used to anesthetize the skin and subcutaneous tissues for local anesthesia. A Chiba needle was then used to access a posterior inferior calyx with ultrasound guidance. With spontaneous urine returned through the needle, passage of an 018 micro wire into the collecting system was performed under fluoroscopy. A small incision was made with an 11 blade scalpel, and the needle was removed from the wire. An Accustick system was then advanced over the wire into the collecting system under fluoroscopy. The metal stiffener and inner dilator were removed, and then a sample of fluid was aspirated through the 4 French outer sheath. Bentson wire was passed into the collecting system and the sheath removed. Ten French dilation of the soft tissues was performed. Using modified Seldinger technique, a 10 French pigtail catheter drain was placed over the Bentson wire. Wire and inner stiffener removed, and the pigtail was formed in the collecting system. Small amount of contrast confirmed position of the catheter. Patient tolerated the procedure well and remained hemodynamically stable throughout. No complications were encountered and no significant blood loss encountered IMPRESSION: Status post bilateral percutaneous nephrostomy placement. Separate culture was sent for each collecting system. Signed, Dulcy Fanny. Earleen Newport, DO [facility] Radiology Electronically Signed   By: Corrie Mckusick D.O.   On: 05/09/2017 14:35    Microbiology: Recent Results (from the past 240 hour(s))  Urine culture     Status: Abnormal   Collection Time: 06/10/2017  8:25 PM  Result Value Ref Range Status   Specimen Description URINE, RANDOM  Final   Special Requests left nephrostomy tube ADD 2308  Final   Culture  (A)  Final    >=100,000 COLONIES/mL STENOTROPHOMONAS MALTOPHILIA 20,000 COLONIES/mL STAPHYLOCOCCUS SPECIES (COAGULASE NEGATIVE)    Report Status 06/02/2017 FINAL  Final   Organism ID, Bacteria STENOTROPHOMONAS MALTOPHILIA (A)  Final   Organism ID, Bacteria STAPHYLOCOCCUS SPECIES (COAGULASE NEGATIVE) (A)  Final      Susceptibility   Stenotrophomonas maltophilia - MIC*    LEVOFLOXACIN <=0.12 SENSITIVE Sensitive     TRIMETH/SULFA <=20 SENSITIVE Sensitive     * >=100,000 COLONIES/mL STENOTROPHOMONAS MALTOPHILIA   Staphylococcus species (coagulase negative) - MIC*    CIPROFLOXACIN 4 RESISTANT Resistant     GENTAMICIN 4 SENSITIVE Sensitive     NITROFURANTOIN <=16 SENSITIVE Sensitive     OXACILLIN >=4 RESISTANT Resistant     TETRACYCLINE >=16 RESISTANT Resistant     VANCOMYCIN 2 SENSITIVE Sensitive     TRIMETH/SULFA 20 SENSITIVE Sensitive     CLINDAMYCIN >=8 RESISTANT Resistant     RIFAMPIN <=0.5 SENSITIVE Sensitive     Inducible Clindamycin NEGATIVE Sensitive     * 20,000 COLONIES/mL STAPHYLOCOCCUS SPECIES (COAGULASE NEGATIVE)     Labs: Basic Metabolic Panel:  Recent Labs Lab 06/01/17 0240 06/02/17 0325 06/03/17 0154 06/03/17 0910 06/04/17 0435  NA 134* 134* 135 136 131*  K 3.9 3.9 4.5 4.9 5.4*  CL 99* 96* 94* 92* 89*  CO2 26  28 27 31 26   GLUCOSE 201* 202* 264* 261* 228*  BUN 21* 28* 38* 48* 72*  CREATININE 1.45* 1.56* 1.89* 2.25* 2.78*  CALCIUM 8.9 8.8* 9.6 9.8 9.6  MG 1.8 1.7 1.9  --  2.4  PHOS  --  4.2  --   --   --    Liver Function Tests:  Recent Labs Lab 06/07/2017 1614 05/31/17 0737 06/01/17 0240 06/02/17 0325 06/03/17 0154  AST 36 27 31 30 30   ALT 27 22 24 23 21   ALKPHOS 98 81 88 90 87  BILITOT 0.9 0.9 0.8 0.6 0.8  PROT 5.8* 5.0* 5.2* 5.4* 5.5*  ALBUMIN 2.8* 2.3* 2.4* 2.5* 2.5*   No results for input(s): LIPASE, AMYLASE in the last 168 hours. No results for input(s): AMMONIA in the last 168 hours. CBC:  Recent Labs Lab 05/28/2017 1614 05/31/17 0737  06/01/17 0240 06/02/17 0325 06/03/17 0154 06/04/17 0435  WBC 8.2 5.8 6.1 7.5 9.7 14.4*  NEUTROABS 5.7 3.8 3.9  --  8.0* 11.4*  HGB 12.2* 10.9* 11.3* 11.6* 11.5* 10.6*  HCT 37.6* 34.2* 35.2* 35.8* 35.2* 32.4*  MCV 92.8 94.0 94.4 93.0 92.6 93.1  PLT 249 222 226 199 206 228   Cardiac Enzymes:  Recent Labs Lab 05/31/17 0737 05/31/17 1140 05/31/17 1826  TROPONINI 0.08* 0.07* 0.07*   D-Dimer No results for input(s): DDIMER in the last 72 hours. BNP: Invalid input(s): POCBNP CBG:  Recent Labs Lab 06/02/17 2117 06/03/17 0616 06/03/17 1135 06/03/17 1747 06/04/17 0815  GLUCAP 239* 256* 246* 236* 256*   Anemia work up No results for input(s): VITAMINB12, FOLATE, FERRITIN, TIBC, IRON, RETICCTPCT in the last 72 hours. Urinalysis    Component Value Date/Time   COLORURINE YELLOW 06/21/2017 2025   COLORURINE RED (A) 06/18/2017 2025   APPEARANCEUR HAZY (A) 06/07/2017 2025   APPEARANCEUR CLOUDY (A) 06/07/2017 2025   LABSPEC 1.013 06/26/2017 2025   LABSPEC 1.012 06/10/2017 2025   PHURINE 5.0 06/17/2017 2025   PHURINE 6.0 05/29/2017 2025   GLUCOSEU NEGATIVE 06/03/2017 2025   GLUCOSEU NEGATIVE 06/14/2017 2025   HGBUR LARGE (A) 06/14/2017 2025   HGBUR LARGE (A) 05/29/2017 2025   BILIRUBINUR NEGATIVE 06/27/2017 2025   BILIRUBINUR NEGATIVE 06/27/2017 2025   KETONESUR NEGATIVE 06/20/2017 2025   KETONESUR NEGATIVE 06/04/2017 2025   PROTEINUR 100 (A) 06/17/2017 2025   PROTEINUR 100 (A) 06/02/2017 2025   UROBILINOGEN 1.0 04/29/2014 1033   NITRITE NEGATIVE 06/01/2017 2025   NITRITE NEGATIVE 06/15/2017 2025   LEUKOCYTESUR SMALL (A) 06/27/2017 2025   LEUKOCYTESUR MODERATE (A) 06/09/2017 2025   Sepsis Labs Invalid input(s): PROCALCITONIN,  WBC,  LACTICIDVEN     SIGNED:  Aline August, MD  [facility] 06/06/2017, 3:17 PM Pager: [phone]  If 7PM-7AM, please contact night-coverage [email or web address] Password [email or web address]

## 2017-06-28 NOTE — Progress Notes (Signed)
Correction- Dr. Starla Link notified.

## 2017-06-28 NOTE — Progress Notes (Signed)
Called into room by family. Pt with no pulse or respirations at this time. 2nd nurse witness was Therapist, occupational. Dr. Allyson Sabal and chaplain notified. Time of death 34.

## 2017-06-28 NOTE — Progress Notes (Signed)
Patient ID: William [name], male   DOB: 24-Apr-1934, 83 y.o.   MRN: [id number]  This NP visited patient at the bedside as a follow up for palliative care needs.   Wife and two daughters at bedside.   Patient appears comfortable on a Morphine gtt.    Discussed natural trajectory and expectations at EOL.   Prognosis is likely hours.    Emotional support offered.  Family express a sense of  Peace at this known EOL .  Questions and concerns addressed  Time in  1140         Time out    1200  Total time spent on the unit was 20 min  Greater than 50% of the time was spent in counseling and coordination of care  Wadie Lessen NP  Palliative Medicine Team Team Phone # [phone] Pager [phone]

## 2017-06-28 NOTE — Progress Notes (Signed)
Patient ID: William [name], male   DOB: 12/13/1933, 83 y.o.   MRN: [id number]  PROGRESS NOTE    William [name]  BMW:[id number] DOB: 05/07/1934 DOA: 06/23/2017 PCP: Jani Gravel, MD   Brief Narrative: 83 year old male with history of paroxysmal atrial fibrillation on anticoagulation, diabetes mellitus, grade 2 diastolic dysfunction who was admitted last month for obstructive uropathy requiring initial stents which failed and hence patient required bilateral nephrostomy tubes and was discharged to rehabilitation unit and subsequently discharged a few days ago from the rehabilitation unit and was readmitted yesterday for worsening shortness of breath and lower extremity edema; CT abdomen showing fluid overload with pleural effusion. He was started on intravenous Lasix. He was switched to oral Lasix after nephrology evaluation. His overall condition is not improving. Palliative care has also evaluated the patient. Patient's condition did not improve and he has been made comfort measures.    Assessment & Plan:   Principal Problem:   Acute pulmonary edema (HCC) Active Problems:   History of prostate cancer   Atrial fibrillation (HCC)   Stage 3 chronic kidney disease   Abdominal pain   Type 2 diabetes mellitus with renal complication (HCC)   Fluid overload   Palliative care by specialist   Acute bilateral low back pain without sciatica   Adult failure to thrive   Pain    1. Fluid overload/pulmonary edema - patient appears in anasarca. Echo showed ejection fraction of 60-65%. Currently comfort measures. 2. Bilateral flank pain with recent nephrostomy tubesplacement - CT scan didnot show anything acute. No further intervention as patient is comfort measures. 3. Acute kidney injury on chronic kidney disease stage III: No further intervention 4. Diabetes mellitus type 2 5. Paroxysmal atrial fibrillation  6. Normocytic normochromic anemia  7. Cholelithiasis  7. UTI with stenotrophomonas  growing in urine cultures 8. Moderate to severe malnutrition with hypo-albuminemia 9. Probable acute shingles rash on the lower abdomen and groin 10. Insomnia and very poor appetite: Continue comfort measures 11. Generalized deconditioning: Continue comfort measures.  DVT prophylaxis: None Code Status:DO NOT RESUSCITATE. Family Communication:Patient's wife and daughterat bedside  Disposition Plan:Unclear at this time  Consultants:Nephrology; urology; IR Procedures:lower extremity duplex are pending negative for DVT on 06/01/2017; 2-D echo report is pending. Echo done on 06/02/2017:  - Left ventricle: Wall thickness was increased in a pattern of severe LVH. Systolic function was normal. The estimated ejection fraction was in the range of 60% to 65%. - Right atrium: The atrium was mildly dilated. - Pulmonary arteries: PA peak pressure: 39 mm Hg (S). - Pericardium, extracardiac: A trivial pericardial effusion was identified. Antimicrobials: Rocephin from 07/04/2018to 06/02/2017 Bactrim from 06/02/2017 - 06/04/2017  Valtrex from 06/01/2017 to 06/04/2017  Subjective: Patient seen and examined at bedside is unresponsive but comfortable.  Objective: Vitals:   06/03/17 0343 06/03/17 0627 06/03/17 1701 06/22/2017 0655  BP: 126/76  (!) 158/92 (!) 93/37  Pulse: 85  90 (!) 106  Resp: (!) 22  16 (!) 25  Temp: 97.9 F (36.6 C)  98.1 F (36.7 C) 99 F (37.2 C)  TempSrc: Oral  Oral Oral  SpO2: 96%  97% (!) 88%  Weight:  93.8 kg (206 lb 14.4 oz) 98.6 kg (217 lb 6.4 oz)   Height:   6\' 1"  (1.854 m)     Intake/Output Summary (Last 24 hours) at 2017/06/22 1138 Last data filed at Jun 22, 2017 0700  Gross per 24 hour  Intake  0 ml  Output               60 ml  Net              -60 ml   Filed Weights   06/02/17 0652 06/03/17 0627 06/03/17 1701  Weight: 92.2 kg (203 lb 3.2 oz) 93.8 kg (206 lb 14.4 oz) 98.6 kg (217 lb 6.4 oz)    Examination:  General exam:  Appears calm and comfortable; Unresponsive     Data Reviewed: I have personally reviewed following labs and imaging studies  CBC:  Recent Labs Lab 06/13/2017 1614 05/31/17 0737 06/01/17 0240 06/02/17 0325 06/03/17 0154 06/04/17 0435  WBC 8.2 5.8 6.1 7.5 9.7 14.4*  NEUTROABS 5.7 3.8 3.9  --  8.0* 11.4*  HGB 12.2* 10.9* 11.3* 11.6* 11.5* 10.6*  HCT 37.6* 34.2* 35.2* 35.8* 35.2* 32.4*  MCV 92.8 94.0 94.4 93.0 92.6 93.1  PLT 249 222 226 199 206 197   Basic Metabolic Panel:  Recent Labs Lab 06/01/17 0240 06/02/17 0325 06/03/17 0154 06/03/17 0910 06/04/17 0435  NA 134* 134* 135 136 131*  K 3.9 3.9 4.5 4.9 5.4*  CL 99* 96* 94* 92* 89*  CO2 26 28 27 31 26   GLUCOSE 201* 202* 264* 261* 228*  BUN 21* 28* 38* 48* 72*  CREATININE 1.45* 1.56* 1.89* 2.25* 2.78*  CALCIUM 8.9 8.8* 9.6 9.8 9.6  MG 1.8 1.7 1.9  --  2.4  PHOS  --  4.2  --   --   --    GFR: Estimated Creatinine Clearance: 25.8 mL/min (A) (by C-G formula based on SCr of 2.78 mg/dL (H)). Liver Function Tests:  Recent Labs Lab 06/20/2017 1614 05/31/17 0737 06/01/17 0240 06/02/17 0325 06/03/17 0154  AST 36 27 31 30 30   ALT 27 22 24 23 21   ALKPHOS 98 81 88 90 87  BILITOT 0.9 0.9 0.8 0.6 0.8  PROT 5.8* 5.0* 5.2* 5.4* 5.5*  ALBUMIN 2.8* 2.3* 2.4* 2.5* 2.5*   No results for input(s): LIPASE, AMYLASE in the last 168 hours. No results for input(s): AMMONIA in the last 168 hours. Coagulation Profile: No results for input(s): INR, PROTIME in the last 168 hours. Cardiac Enzymes:  Recent Labs Lab 05/31/17 0737 05/31/17 1140 05/31/17 1826  TROPONINI 0.08* 0.07* 0.07*   BNP (last 3 results) No results for input(s): PROBNP in the last 8760 hours. HbA1C: No results for input(s): HGBA1C in the last 72 hours. CBG:  Recent Labs Lab 06/02/17 2117 06/03/17 0616 06/03/17 1135 06/03/17 1747 06/04/17 0815  GLUCAP 239* 256* 246* 236* 256*   Lipid Profile: No results for input(s): CHOL, HDL, LDLCALC, TRIG,  CHOLHDL, LDLDIRECT in the last 72 hours. Thyroid Function Tests: No results for input(s): TSH, T4TOTAL, FREET4, T3FREE, THYROIDAB in the last 72 hours. Anemia Panel: No results for input(s): VITAMINB12, FOLATE, FERRITIN, TIBC, IRON, RETICCTPCT in the last 72 hours. Sepsis Labs:  Recent Labs Lab 06/02/2017 1630  LATICACIDVEN 1.97*    Recent Results (from the past 240 hour(s))  Urine culture     Status: Abnormal   Collection Time: 05/29/2017  8:25 PM  Result Value Ref Range Status   Specimen Description URINE, RANDOM  Final   Special Requests left nephrostomy tube ADD 2308  Final   Culture (A)  Final    >=100,000 COLONIES/mL STENOTROPHOMONAS MALTOPHILIA 20,000 COLONIES/mL STAPHYLOCOCCUS SPECIES (COAGULASE NEGATIVE)    Report Status 06/02/2017 FINAL  Final   Organism ID, Bacteria STENOTROPHOMONAS MALTOPHILIA (A)  Final  Organism ID, Bacteria STAPHYLOCOCCUS SPECIES (COAGULASE NEGATIVE) (A)  Final      Susceptibility   Stenotrophomonas maltophilia - MIC*    LEVOFLOXACIN <=0.12 SENSITIVE Sensitive     TRIMETH/SULFA <=20 SENSITIVE Sensitive     * >=100,000 COLONIES/mL STENOTROPHOMONAS MALTOPHILIA   Staphylococcus species (coagulase negative) - MIC*    CIPROFLOXACIN 4 RESISTANT Resistant     GENTAMICIN 4 SENSITIVE Sensitive     NITROFURANTOIN <=16 SENSITIVE Sensitive     OXACILLIN >=4 RESISTANT Resistant     TETRACYCLINE >=16 RESISTANT Resistant     VANCOMYCIN 2 SENSITIVE Sensitive     TRIMETH/SULFA 20 SENSITIVE Sensitive     CLINDAMYCIN >=8 RESISTANT Resistant     RIFAMPIN <=0.5 SENSITIVE Sensitive     Inducible Clindamycin NEGATIVE Sensitive     * 20,000 COLONIES/mL STAPHYLOCOCCUS SPECIES (COAGULASE NEGATIVE)         Radiology Studies: No results found.      Scheduled Meds: . Gerhardt's butt cream   Topical BID  . glycopyrrolate  0.2 mg Intravenous Q8H  . LORazepam  0.5 mg Intravenous QHS,MR X 1   Continuous Infusions: . methocarbamol (ROBAXIN)  IV Stopped  (06/04/17 1206)  . morphine 1 mg/hr (06/04/17 1221)     LOS: 5 days        Aline August, MD [facility] Pager [phone]  If 7PM-7AM, please contact night-coverage [email or web address] Password [email or web address] June 10, 2017, 11:38 AM

## 2017-06-28 DEATH — deceased

## 2017-07-25 IMAGING — US US RENAL
1 series · 14 of 25 positions shown · non-contrast
Comparison: CT abdomen/ pelvis dated 04/14/2017

CLINICAL DATA: Acute renal failure

EXAM:
RENAL / URINARY TRACT ULTRASOUND COMPLETE

[Series 1: us renal · 0.30mm/px · 14 of 34 slices shown]
[im 1/34]
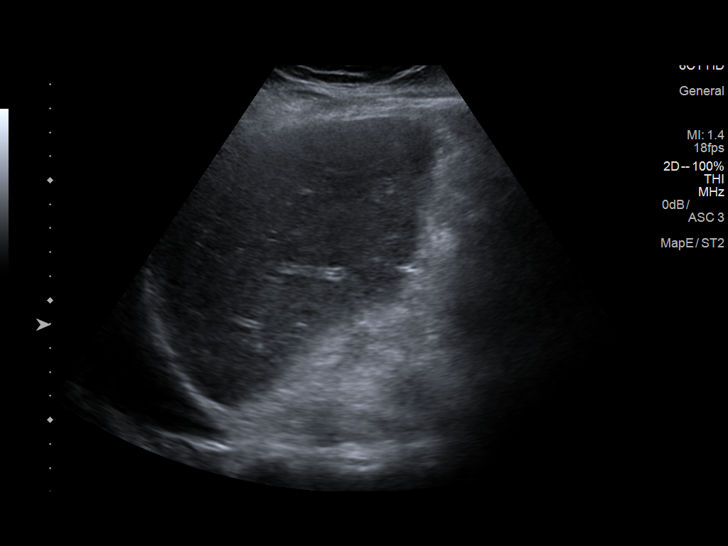
[im 3/34]
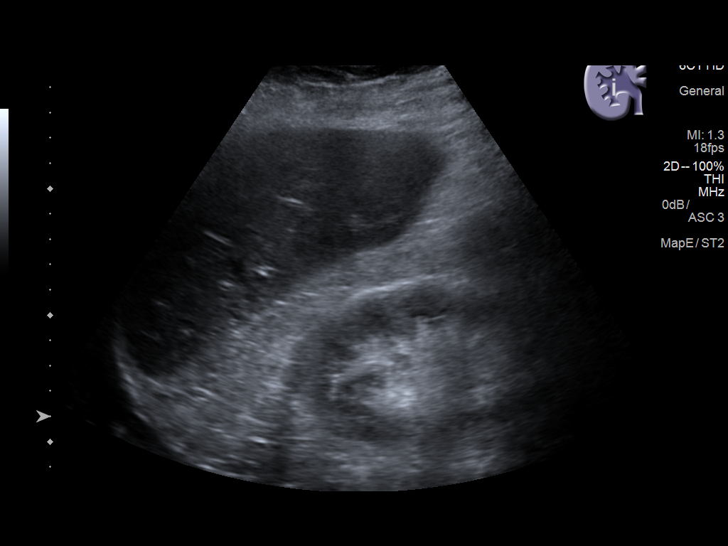
[im 6/34]
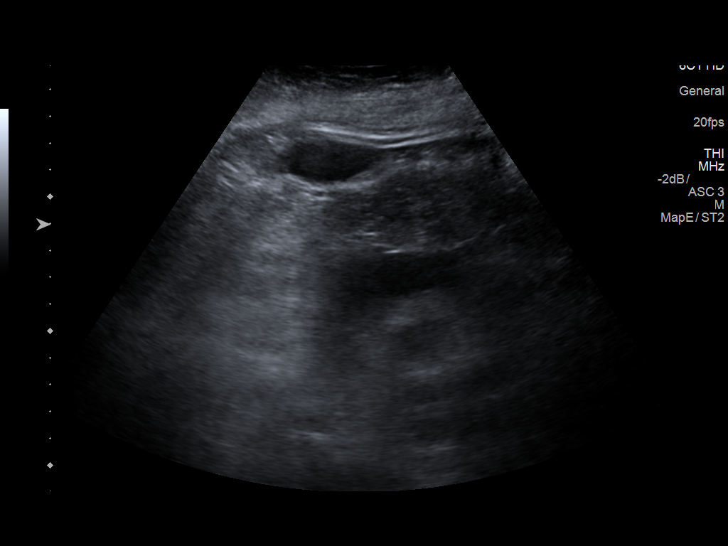
[im 9/34]
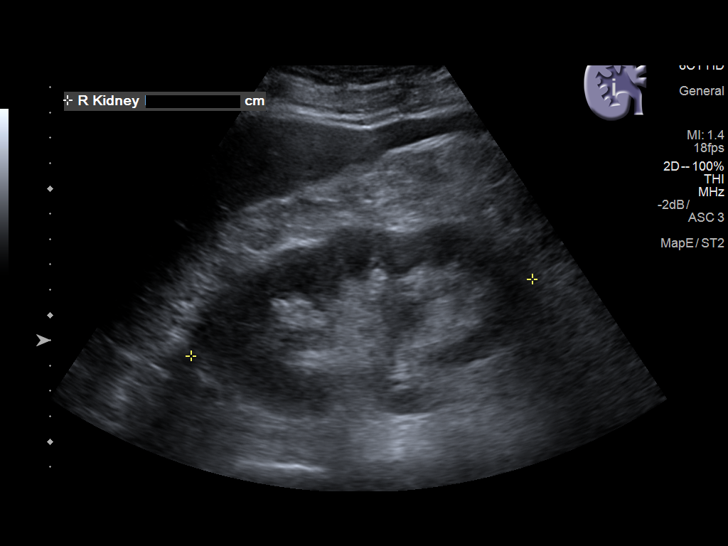
[im 12/34]
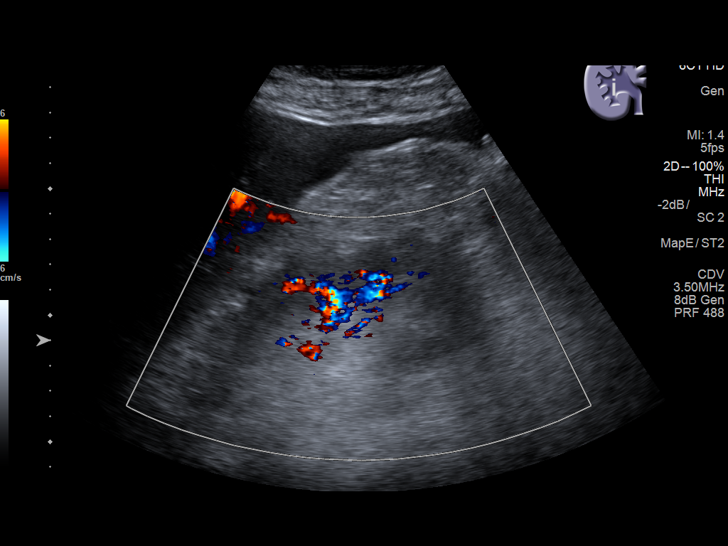
[im 13/34]
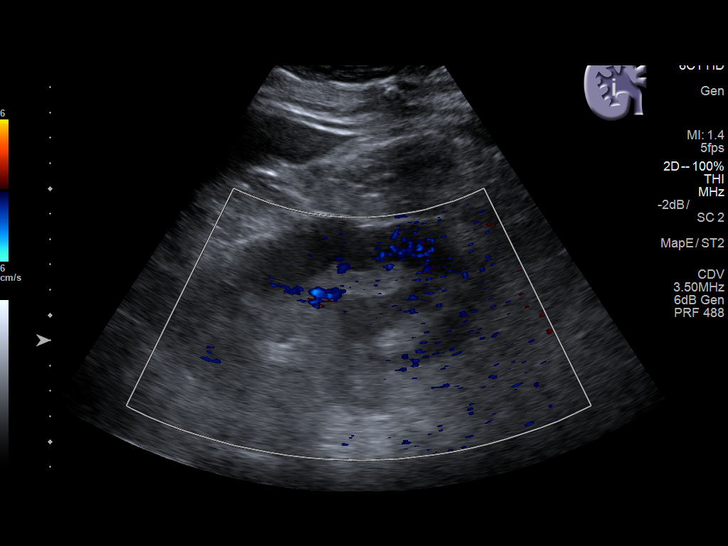
[im 16/34]
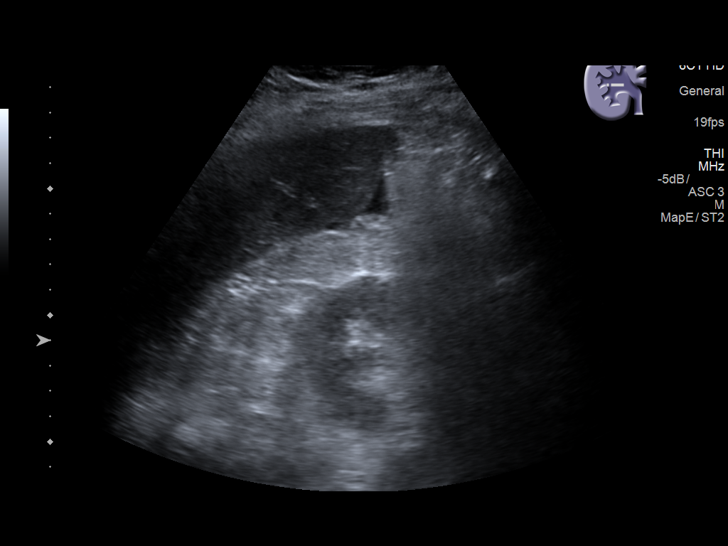
[im 18/34]
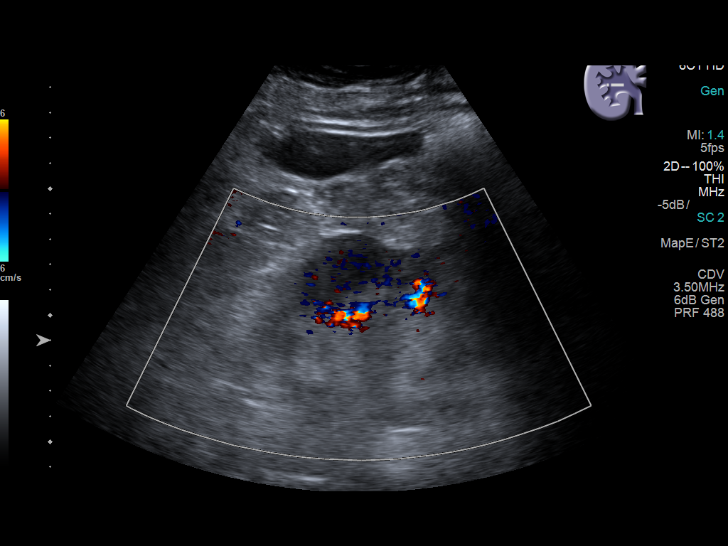
[im 21/34]
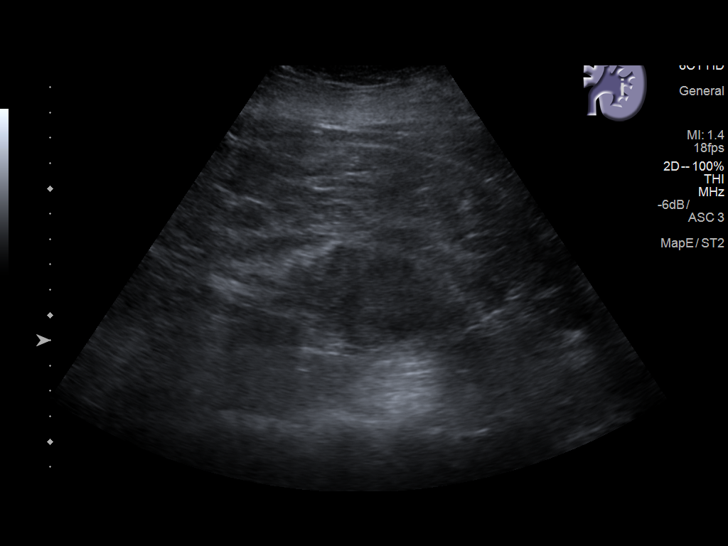
[im 23/34]
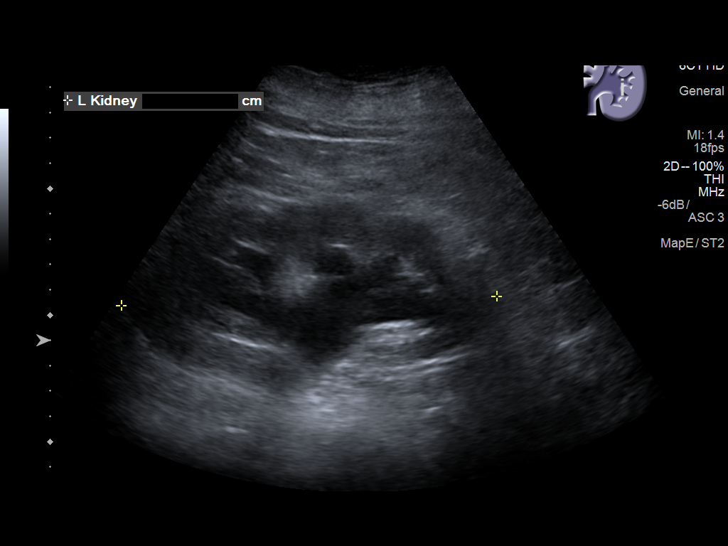
[im 25/34]
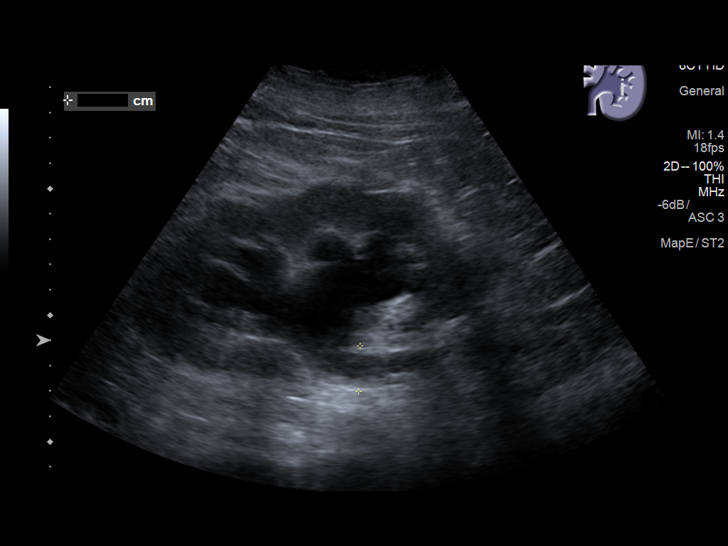
[im 28/34]
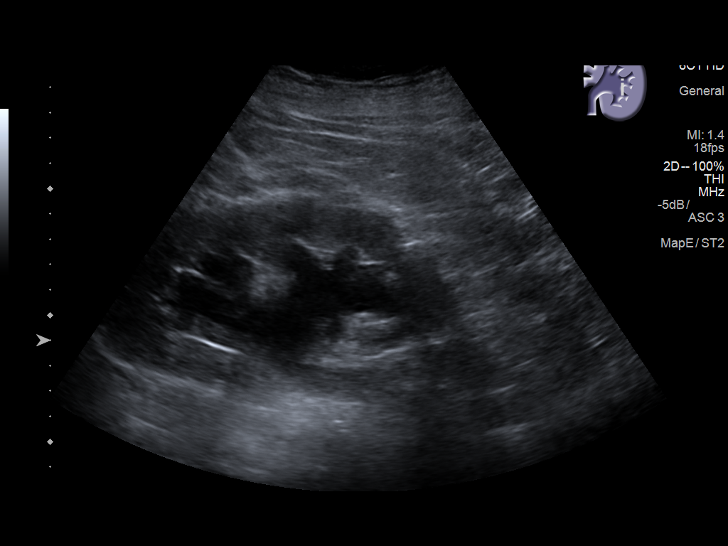
[im 31/34]
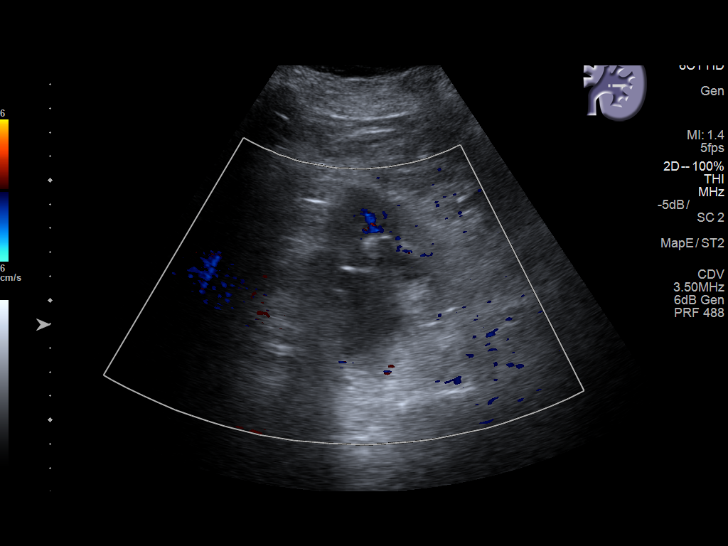
[im 34/34]
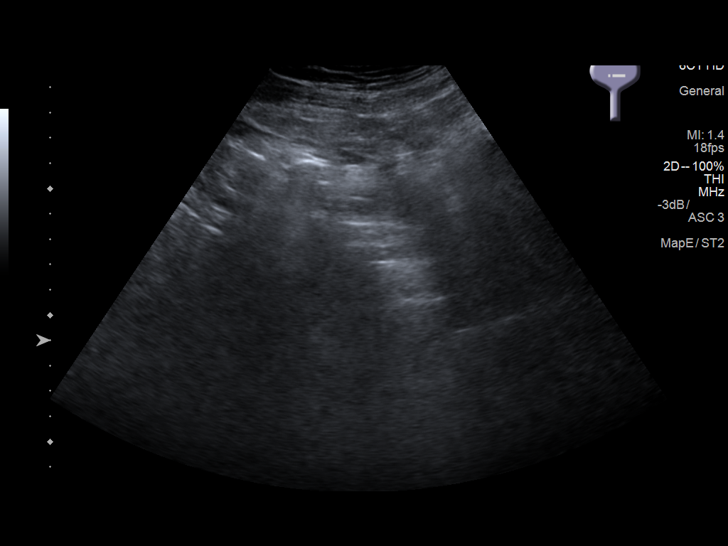

[14 of 25 positions shown; findings below may reference images not displayed]

FINDINGS: Right Kidney:

Length: 13.8 cm.  Moderate right hydronephrosis, new.

Left Kidney:

Length: 14.8 cm.  Moderate left hydronephrosis, unchanged

Bladder:

Not discretely visualized.

Additional comments:  Right pleural effusion.  Ascites.
IMPRESSION: Moderate left hydronephrosis, unchanged from prior CT.

Moderate right hydronephrosis, new.

Bladder is not discretely visualized.

## 2018-09-09 IMAGING — RF DG SWALLOWING FUNCTION - [facility]
1 series · 18 of 24 positions shown · non-contrast
Comparison: none

[Series 1: run · 47 acquisitions, 18 frames shown]
[im 1/47]
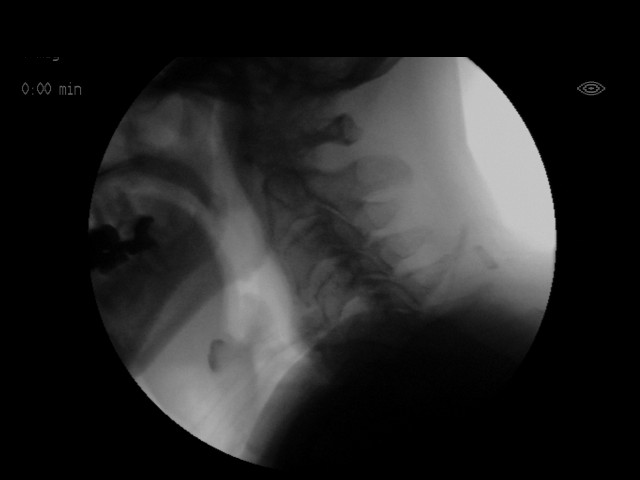
[im 5/47]
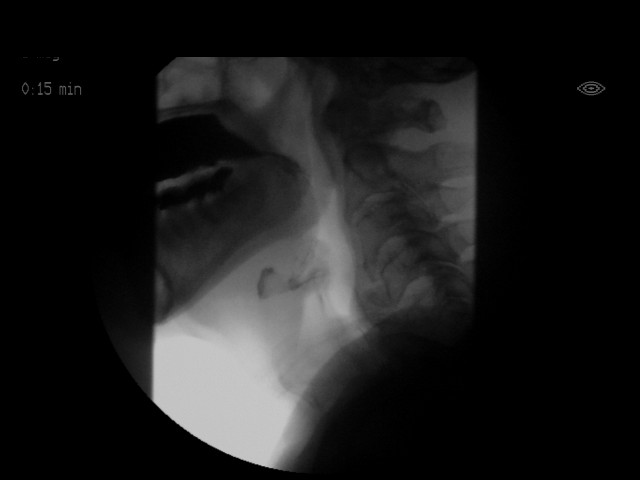
[im 7/47]
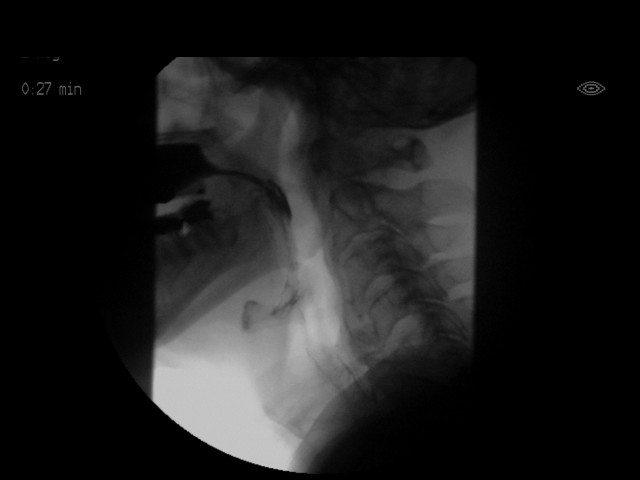
[im 9/47]
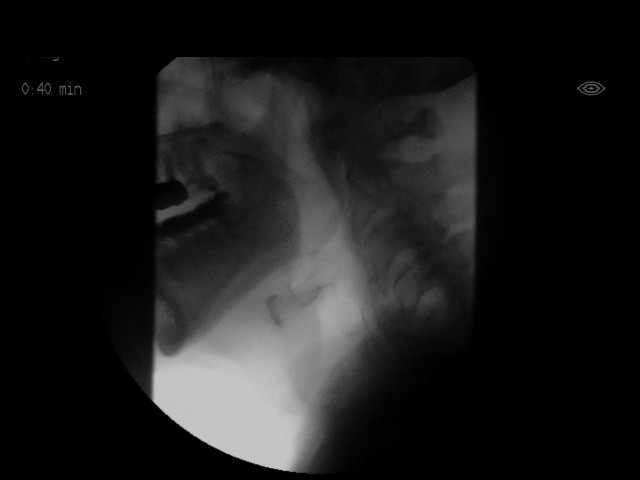
[im 13/47]
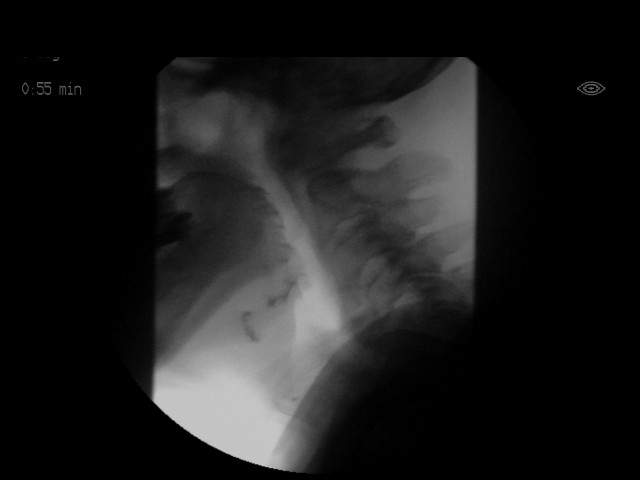
[im 15/47]
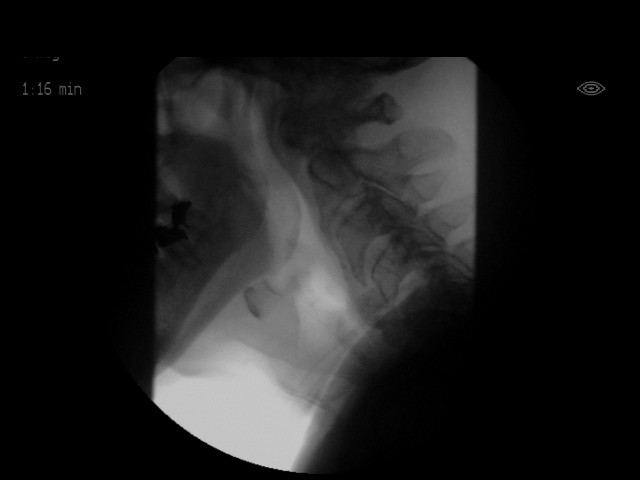
[im 17/47]
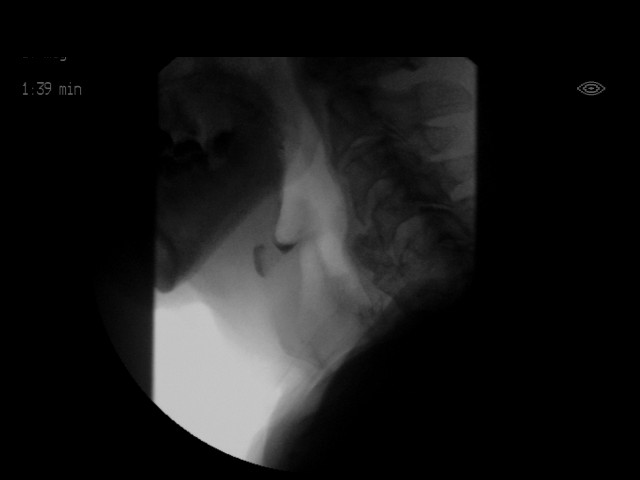
[im 21/47]
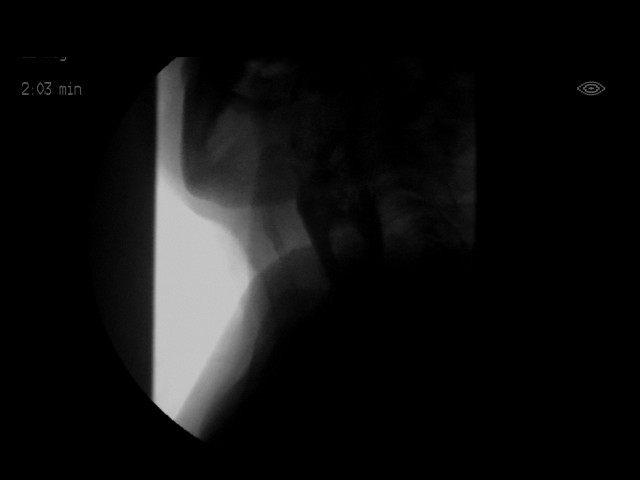
[im 23/47]
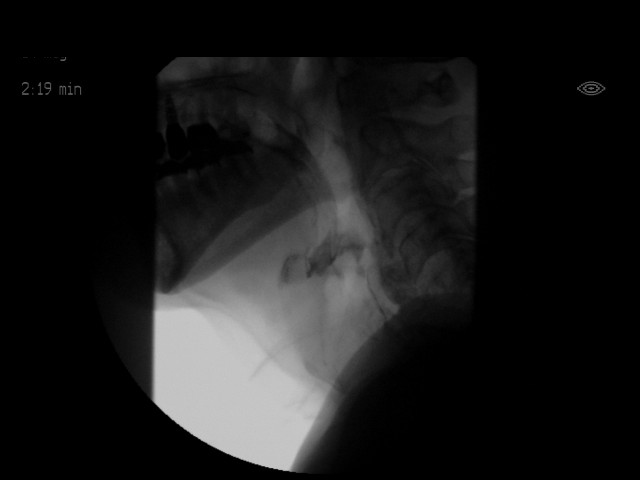
[im 25/47]
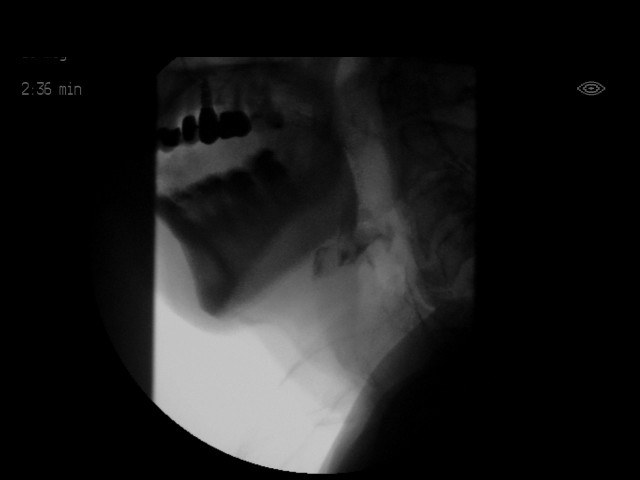
[im 29/47]
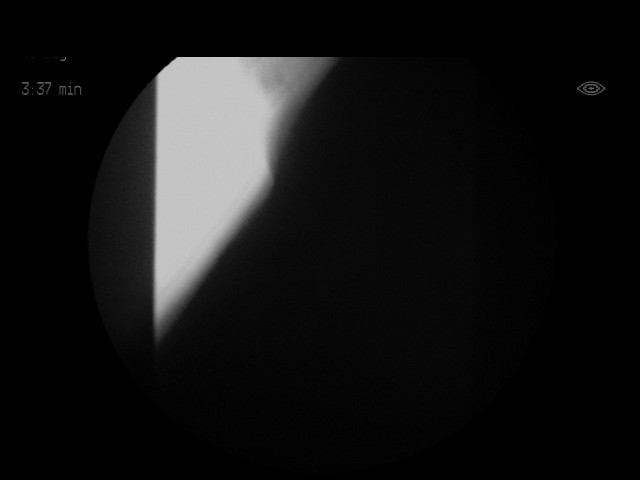
[im 31/47]
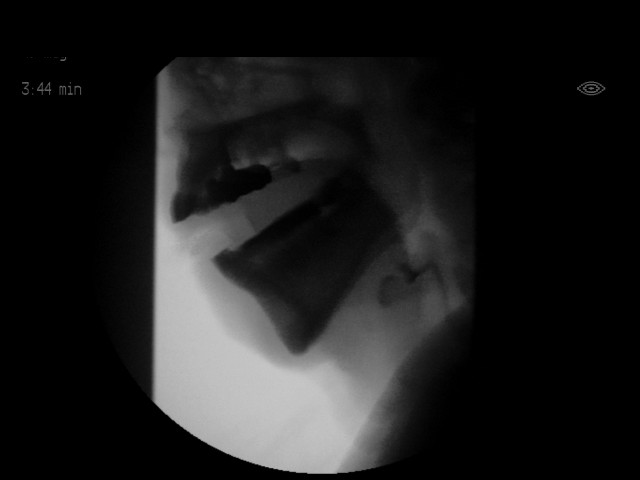
[im 33/47]
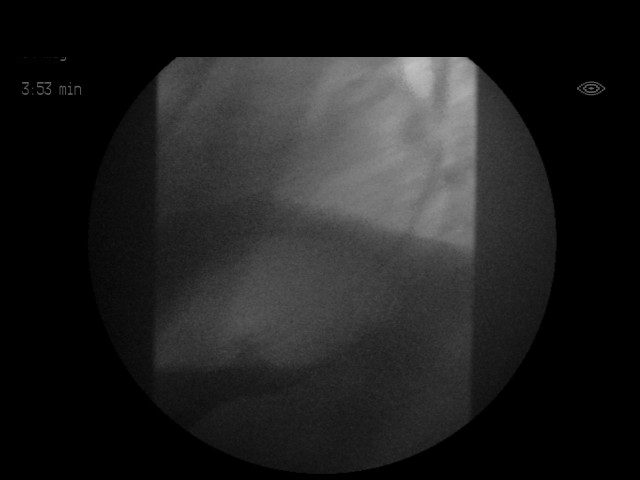
[im 37/47]
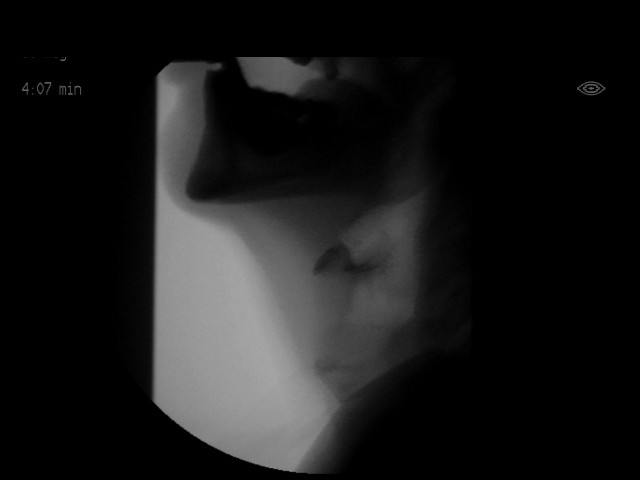
[im 39/47]
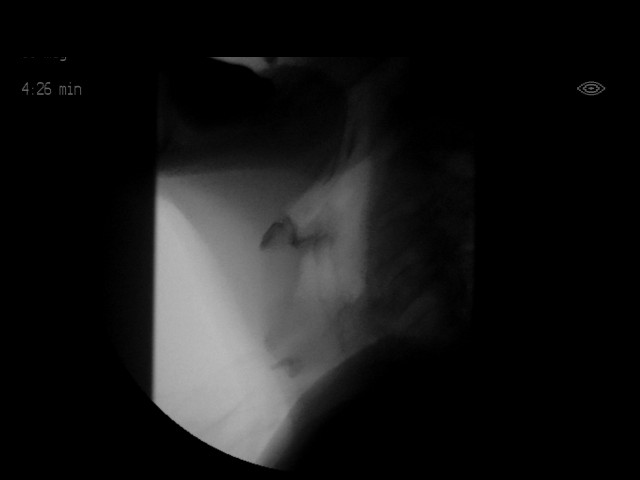
[im 41/47]
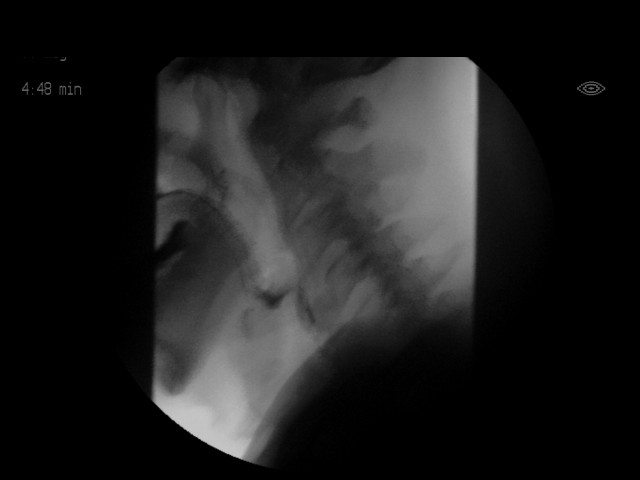
[im 45/47]
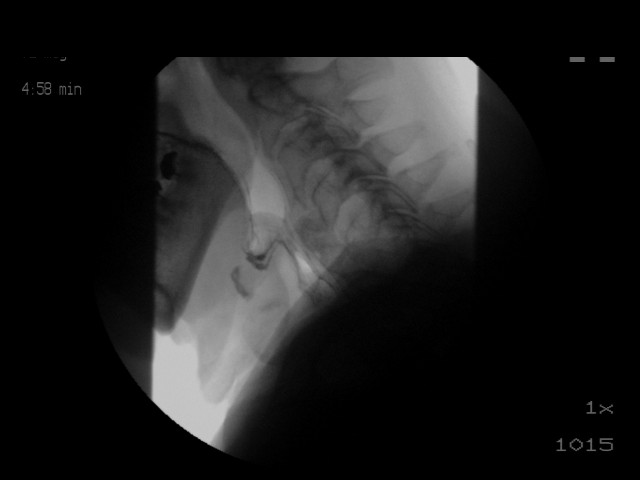
[im 47/47]
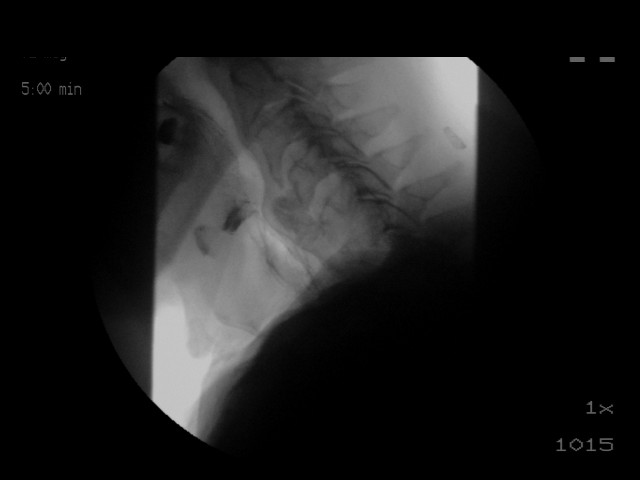

[18 of 24 positions shown; findings below may reference images not displayed]

FLUOROSCOPY FOR SWALLOWING FUNCTION STUDY:
Fluoroscopy was provided for swallowing function study, which was administered by a speech pathologist.  Final results and recommendations from this study are contained within the speech pathology report.

## 2018-09-15 IMAGING — RF DG SWALLOWING FUNCTION - [facility]
1 series · 18 of 24 positions shown · non-contrast
Comparison: none

[Series 1: run · 15 acquisitions, 18 frames shown]
[im 1/15]
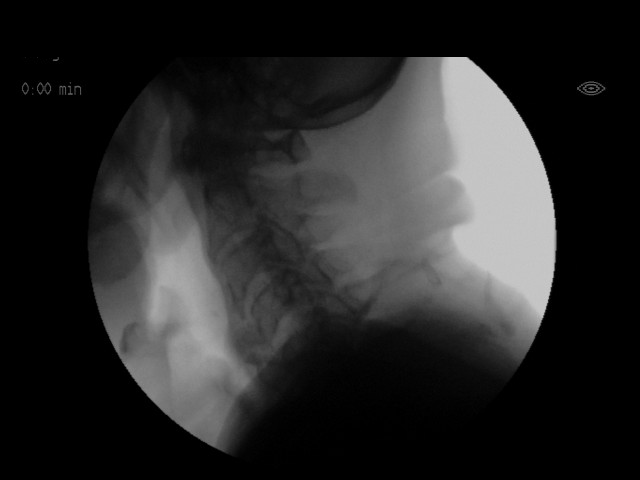
[im 2/15]
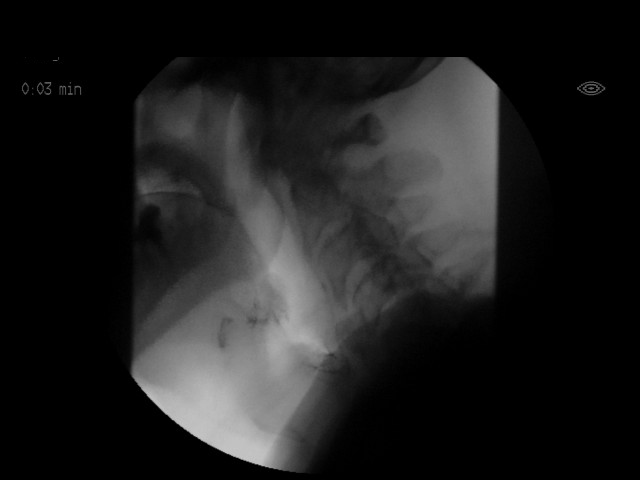
[im 3/15]
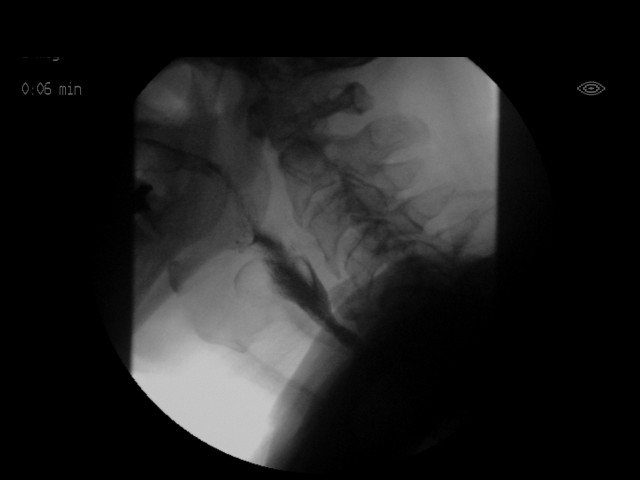
[im 3/15]
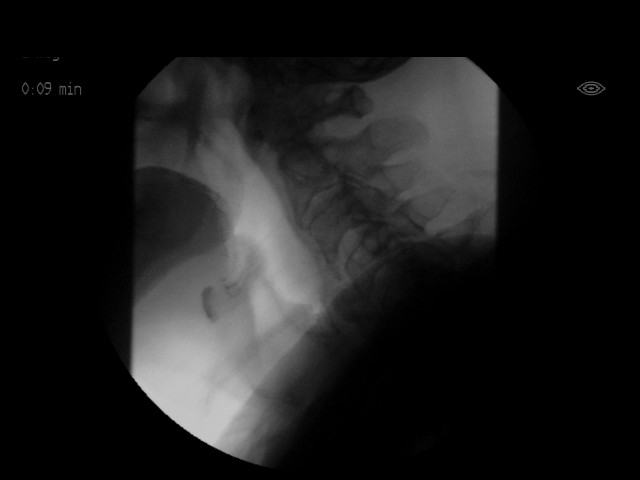
[im 4/15]
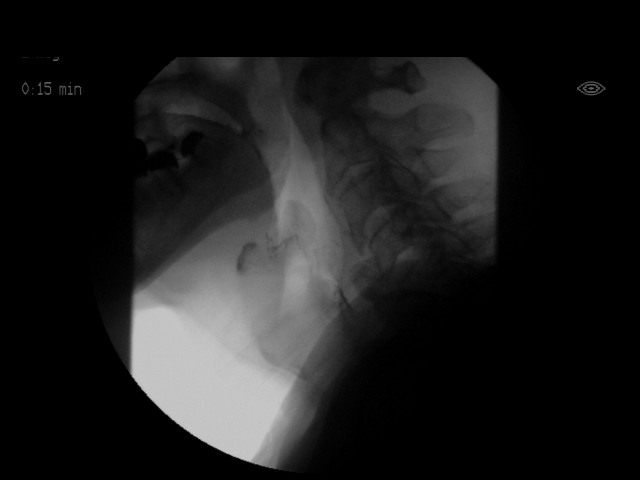
[im 5/15]
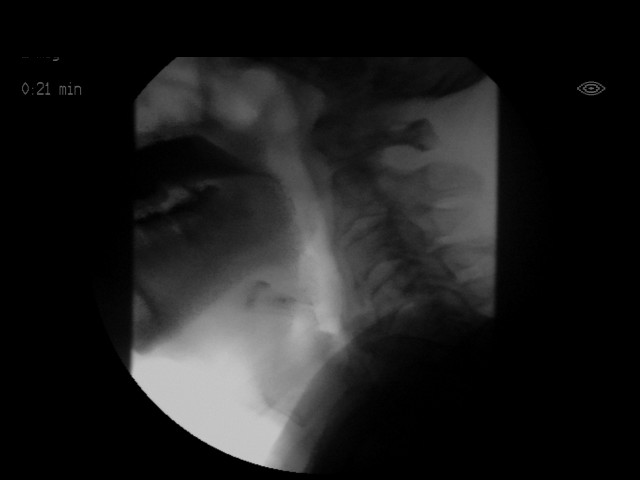
[im 6/15]
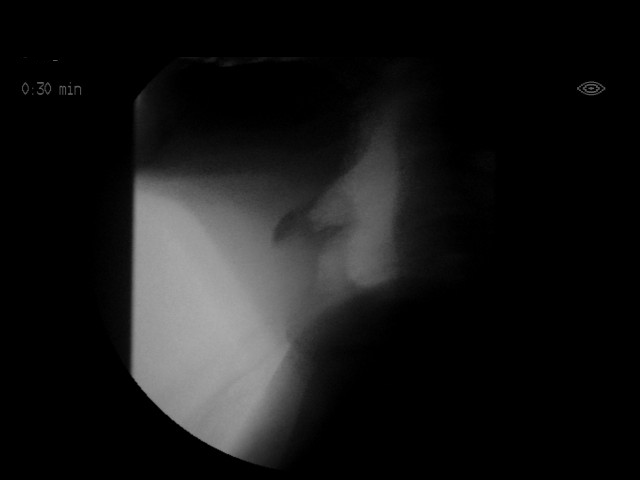
[im 7/15]
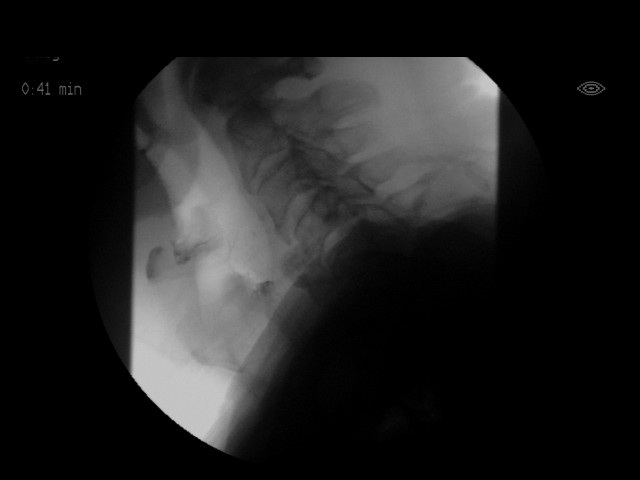
[im 8/15]
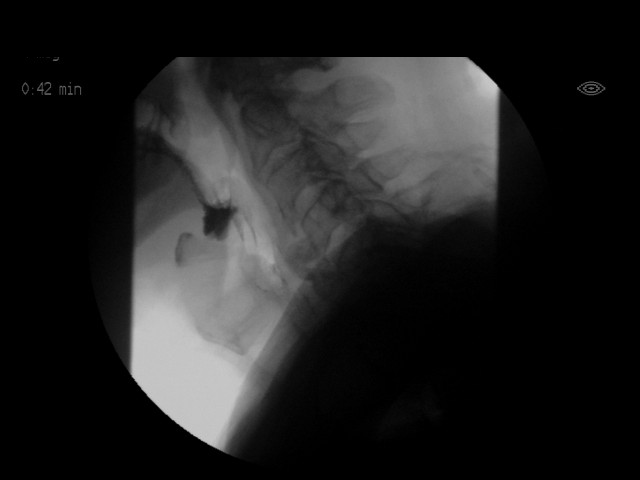
[im 8/15]
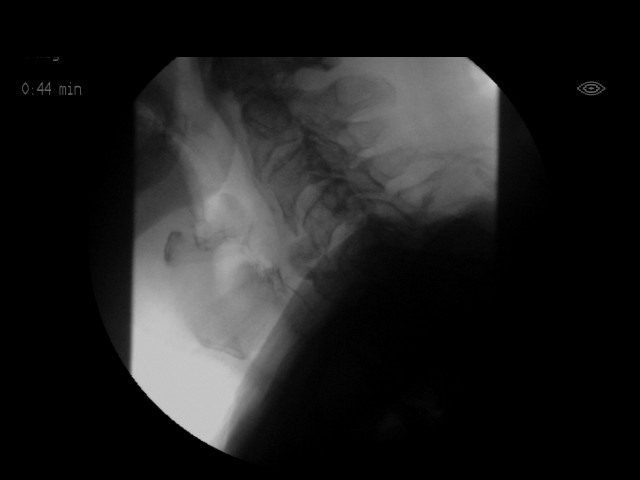
[im 10/15]
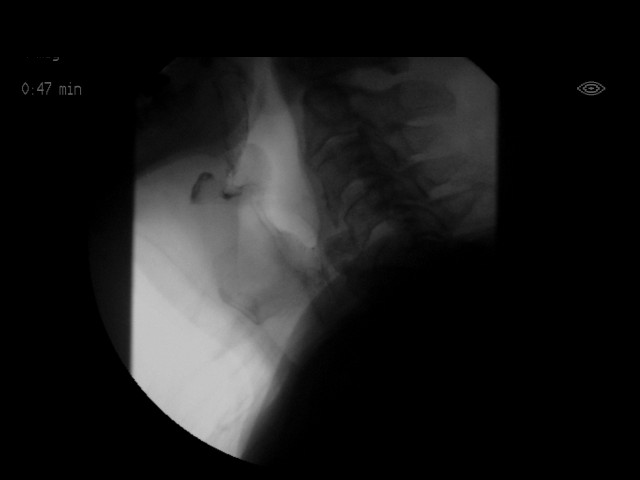
[im 10/15]
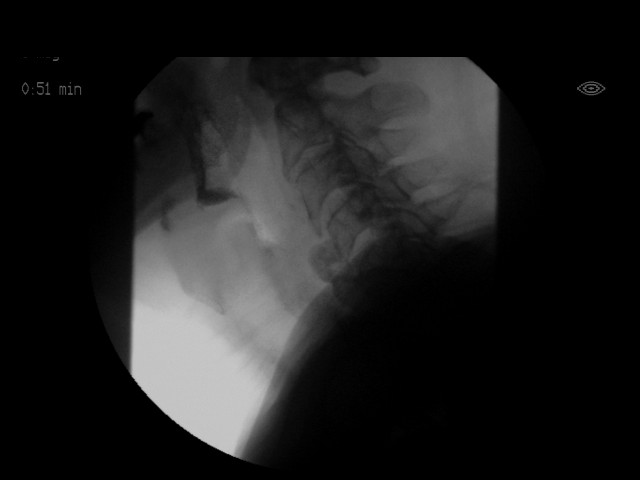
[im 11/15]
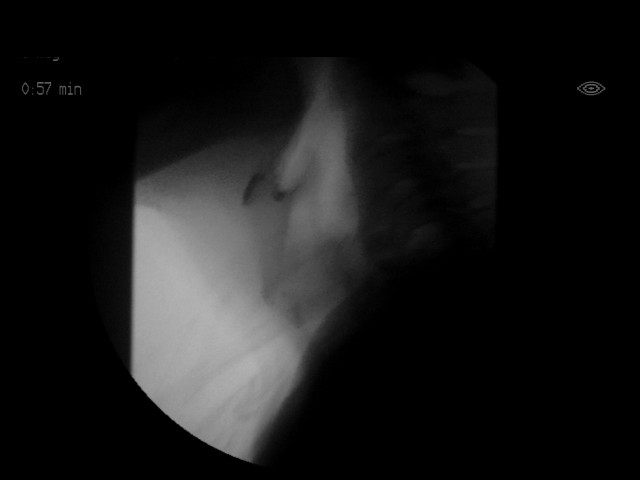
[im 12/15]
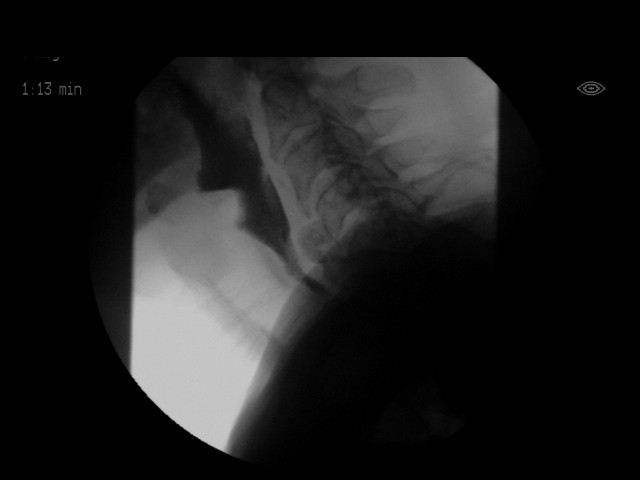
[im 13/15]
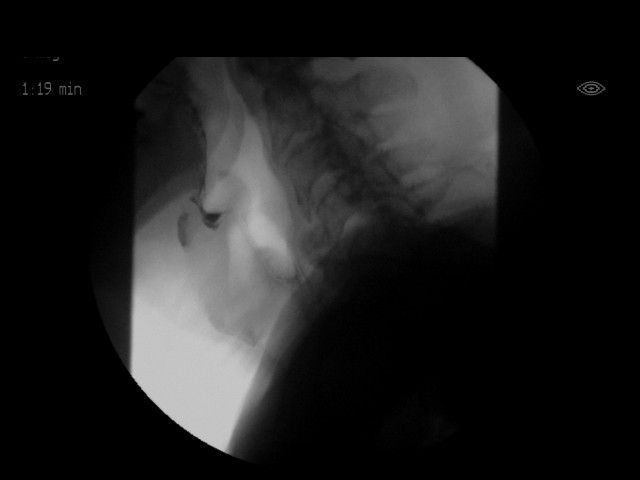
[im 13/15]
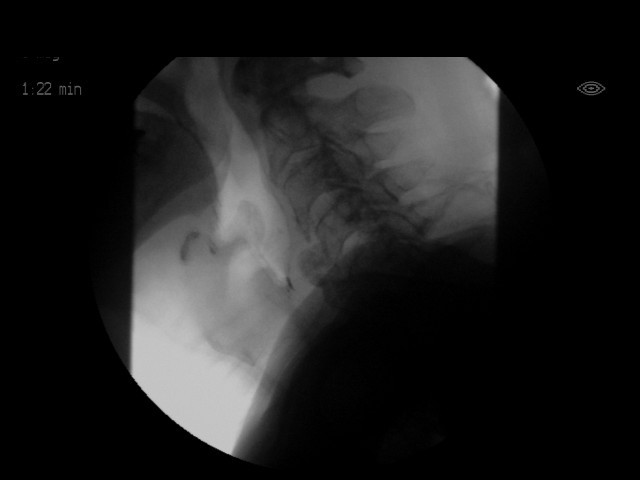
[im 15/15]
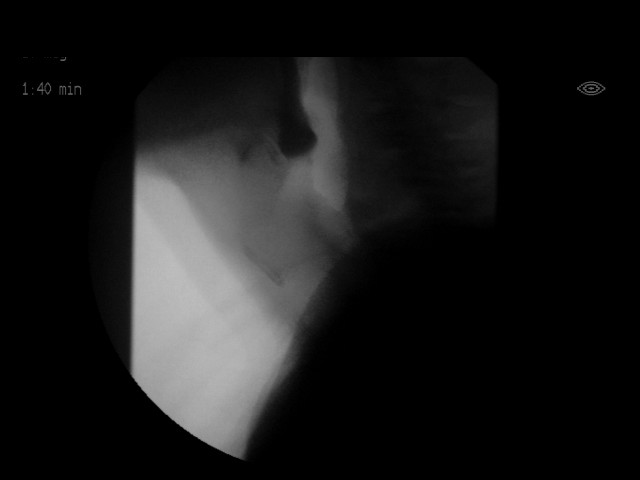
[im 15/15]
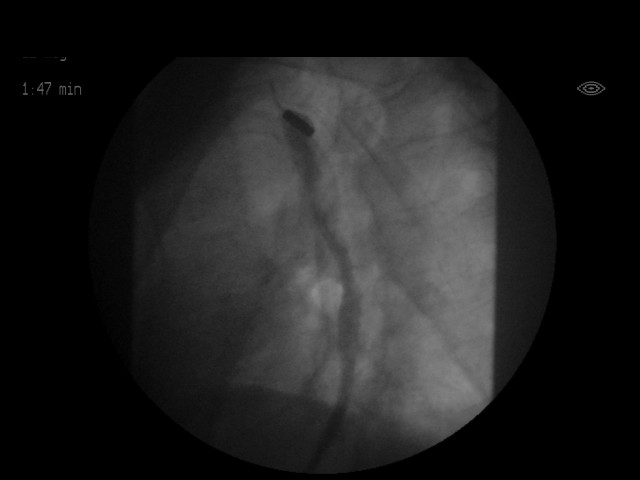

[18 of 24 positions shown; findings below may reference images not displayed]

FLUOROSCOPY FOR SWALLOWING FUNCTION STUDY:
Fluoroscopy was provided for swallowing function study, which was administered by a speech pathologist.  Final results and recommendations from this study are contained within the speech pathology report.

## 2020-08-20 NOTE — Telephone Encounter (Signed)
error 

## 2024-09-28 DEATH — deceased
